# Patient Record
Sex: Male | Born: 1935
Health system: Southern US, Community
[De-identification: ages and names within clinical notes are randomized; demographics above are authoritative.]

## PROBLEM LIST (undated history)

## (undated) DIAGNOSIS — I4891 Unspecified atrial fibrillation: Secondary | ICD-10-CM

## (undated) DIAGNOSIS — N400 Enlarged prostate without lower urinary tract symptoms: Secondary | ICD-10-CM

## (undated) DIAGNOSIS — M1990 Unspecified osteoarthritis, unspecified site: Secondary | ICD-10-CM

## (undated) DIAGNOSIS — F0390 Unspecified dementia without behavioral disturbance: Secondary | ICD-10-CM

## (undated) DIAGNOSIS — B029 Zoster without complications: Secondary | ICD-10-CM

## (undated) DIAGNOSIS — I739 Peripheral vascular disease, unspecified: Secondary | ICD-10-CM

## (undated) DIAGNOSIS — H3530 Unspecified macular degeneration: Secondary | ICD-10-CM

## (undated) DIAGNOSIS — N200 Calculus of kidney: Secondary | ICD-10-CM

## (undated) HISTORY — PX: EYE SURGERY: SHX253

## (undated) HISTORY — DX: Unspecified osteoarthritis, unspecified site: M19.90

## (undated) HISTORY — PX: HERNIA REPAIR: SHX51

## (undated) HISTORY — PX: APPENDECTOMY: SHX54

---

## 2005-09-20 ENCOUNTER — Ambulatory Visit: Payer: Self-pay | Admitting: Pulmonary Disease

## 2005-10-14 ENCOUNTER — Ambulatory Visit: Payer: Self-pay | Admitting: Pulmonary Disease

## 2005-10-22 ENCOUNTER — Ambulatory Visit: Payer: Self-pay | Admitting: Pulmonary Disease

## 2009-01-16 ENCOUNTER — Ambulatory Visit: Payer: Self-pay | Admitting: Internal Medicine

## 2009-01-16 DIAGNOSIS — M129 Arthropathy, unspecified: Secondary | ICD-10-CM | POA: Insufficient documentation

## 2009-01-16 DIAGNOSIS — R531 Weakness: Secondary | ICD-10-CM | POA: Insufficient documentation

## 2009-01-16 DIAGNOSIS — Z87442 Personal history of urinary calculi: Secondary | ICD-10-CM | POA: Insufficient documentation

## 2009-01-16 DIAGNOSIS — J309 Allergic rhinitis, unspecified: Secondary | ICD-10-CM | POA: Insufficient documentation

## 2009-01-16 DIAGNOSIS — E785 Hyperlipidemia, unspecified: Secondary | ICD-10-CM | POA: Insufficient documentation

## 2009-01-16 DIAGNOSIS — R3911 Hesitancy of micturition: Secondary | ICD-10-CM | POA: Insufficient documentation

## 2009-01-16 DIAGNOSIS — R5383 Other fatigue: Secondary | ICD-10-CM

## 2009-01-17 DIAGNOSIS — Z8551 Personal history of malignant neoplasm of bladder: Secondary | ICD-10-CM | POA: Insufficient documentation

## 2009-01-17 DIAGNOSIS — M545 Low back pain: Secondary | ICD-10-CM

## 2009-01-17 DIAGNOSIS — Z87438 Personal history of other diseases of male genital organs: Secondary | ICD-10-CM | POA: Insufficient documentation

## 2009-01-17 DIAGNOSIS — G8929 Other chronic pain: Secondary | ICD-10-CM | POA: Insufficient documentation

## 2009-01-17 LAB — CONVERTED CEMR LAB
ALT: 18 units/L (ref 0–53)
AST: 21 units/L (ref 0–37)
Albumin: 4 g/dL (ref 3.5–5.2)
Alkaline Phosphatase: 86 units/L (ref 39–117)
BUN: 16 mg/dL (ref 6–23)
Basophils Absolute: 0 10*3/uL (ref 0.0–0.1)
Basophils Relative: 0.7 % (ref 0.0–3.0)
Bilirubin, Direct: 0.1 mg/dL (ref 0.0–0.3)
CO2: 25 meq/L (ref 19–32)
Calcium: 9.3 mg/dL (ref 8.4–10.5)
Chloride: 105 meq/L (ref 96–112)
Cholesterol: 224 mg/dL — ABNORMAL HIGH (ref 0–200)
Creatinine, Ser: 1 mg/dL (ref 0.4–1.5)
Direct LDL: 174.9 mg/dL
Eosinophils Absolute: 0.3 10*3/uL (ref 0.0–0.7)
Eosinophils Relative: 4.1 % (ref 0.0–5.0)
Folate: >20 ng/mL
GFR calc non Af Amer: 75.32 mL/min (ref >60)
Glucose, Bld: 95 mg/dL (ref 70–99)
HCT: 41.5 % (ref 39.0–52.0)
HDL: 36.8 mg/dL — ABNORMAL LOW (ref >39.00)
Hemoglobin: 13.7 g/dL (ref 13.0–17.0)
Lymphocytes Relative: 25.7 % (ref 12.0–46.0)
Lymphs Abs: 1.6 10*3/uL (ref 0.7–4.0)
MCHC: 33.1 g/dL (ref 30.0–36.0)
MCV: 93.7 fL (ref 78.0–100.0)
Monocytes Absolute: 0.6 10*3/uL (ref 0.1–1.0)
Monocytes Relative: 9.4 % (ref 3.0–12.0)
Neutro Abs: 3.9 10*3/uL (ref 1.4–7.7)
Neutrophils Relative %: 60.1 % (ref 43.0–77.0)
PSA: 0.79 ng/mL (ref 0.10–4.00)
Platelets: 226 10*3/uL (ref 150.0–400.0)
Potassium: 4.5 meq/L (ref 3.5–5.1)
RBC: 4.43 M/uL (ref 4.22–5.81)
RDW: 12.9 % (ref 11.5–14.6)
Sodium: 141 meq/L (ref 135–145)
TSH: 1.12 microintl units/mL (ref 0.35–5.50)
Total Bilirubin: 0.5 mg/dL (ref 0.3–1.2)
Total CHOL/HDL Ratio: 6
Total Protein: 7 g/dL (ref 6.0–8.3)
Triglycerides: 150 mg/dL — ABNORMAL HIGH (ref 0.0–149.0)
VLDL: 30 mg/dL (ref 0.0–40.0)
Vitamin B-12: 402 pg/mL (ref 211–911)
WBC: 6.4 10*3/uL (ref 4.5–10.5)

## 2009-01-20 ENCOUNTER — Ambulatory Visit: Payer: Self-pay | Admitting: Internal Medicine

## 2009-01-23 DIAGNOSIS — M48061 Spinal stenosis, lumbar region without neurogenic claudication: Secondary | ICD-10-CM | POA: Insufficient documentation

## 2009-01-23 DIAGNOSIS — R930 Abnormal findings on diagnostic imaging of skull and head, not elsewhere classified: Secondary | ICD-10-CM | POA: Insufficient documentation

## 2009-01-23 DIAGNOSIS — M5137 Other intervertebral disc degeneration, lumbosacral region: Secondary | ICD-10-CM | POA: Insufficient documentation

## 2009-01-23 DIAGNOSIS — K4090 Unilateral inguinal hernia, without obstruction or gangrene, not specified as recurrent: Secondary | ICD-10-CM | POA: Insufficient documentation

## 2009-01-24 ENCOUNTER — Telehealth: Payer: Self-pay | Admitting: Internal Medicine

## 2009-02-14 ENCOUNTER — Telehealth: Payer: Self-pay | Admitting: Internal Medicine

## 2009-02-16 ENCOUNTER — Ambulatory Visit: Payer: Self-pay | Admitting: Internal Medicine

## 2009-03-06 ENCOUNTER — Encounter: Payer: Self-pay | Admitting: Internal Medicine

## 2009-08-21 ENCOUNTER — Encounter: Payer: Self-pay | Admitting: Internal Medicine

## 2009-08-30 ENCOUNTER — Ambulatory Visit (HOSPITAL_COMMUNITY): Admission: RE | Admit: 2009-08-30 | Discharge: 2009-08-30 | Payer: Self-pay | Admitting: General Surgery

## 2009-09-14 ENCOUNTER — Encounter: Payer: Self-pay | Admitting: Internal Medicine

## 2010-05-08 NOTE — Letter (Signed)
Summary: [facility]   Imported By: Sherian Rein 09/20/2009 11:44:19  _____________________________________________________________________  External Attachment:    Type:   Image     Comment:   External Document

## 2010-05-08 NOTE — Letter (Signed)
Summary: [facility]   Imported By: Sherian Rein 09/26/2009 08:20:19  _____________________________________________________________________  External Attachment:    Type:   Image     Comment:   External Document

## 2010-06-25 LAB — CBC
HCT: 41.4 % (ref 39.0–52.0)
Hemoglobin: 13.6 g/dL (ref 13.0–17.0)
MCHC: 32.8 g/dL (ref 30.0–36.0)
MCV: 92.8 fL (ref 78.0–100.0)
Platelets: 211 10*3/uL (ref 150–400)
RBC: 4.46 MIL/uL (ref 4.22–5.81)
RDW: 13.9 % (ref 11.5–15.5)
WBC: 6.3 10*3/uL (ref 4.0–10.5)

## 2010-06-25 LAB — DIFFERENTIAL
Basophils Absolute: 0.1 10*3/uL (ref 0.0–0.1)
Basophils Relative: 1 % (ref 0–1)
Eosinophils Absolute: 0.3 10*3/uL (ref 0.0–0.7)
Eosinophils Relative: 4 % (ref 0–5)
Lymphocytes Relative: 31 % (ref 12–46)
Lymphs Abs: 2 10*3/uL (ref 0.7–4.0)
Monocytes Absolute: 0.5 10*3/uL (ref 0.1–1.0)
Monocytes Relative: 8 % (ref 3–12)
Neutro Abs: 3.5 10*3/uL (ref 1.7–7.7)
Neutrophils Relative %: 56 % (ref 43–77)

## 2010-06-25 LAB — BASIC METABOLIC PANEL
BUN: 14 mg/dL (ref 6–23)
CO2: 25 mEq/L (ref 19–32)
Calcium: 9.2 mg/dL (ref 8.4–10.5)
Chloride: 107 mEq/L (ref 96–112)
Creatinine, Ser: 0.96 mg/dL (ref 0.4–1.5)
GFR calc Af Amer: >60 mL/min (ref >60)
GFR calc non Af Amer: >60 mL/min (ref >60)
Glucose, Bld: 95 mg/dL (ref 70–99)
Potassium: 4.4 mEq/L (ref 3.5–5.1)
Sodium: 139 mEq/L (ref 135–145)

## 2014-02-08 ENCOUNTER — Ambulatory Visit (INDEPENDENT_AMBULATORY_CARE_PROVIDER_SITE_OTHER): Payer: Medicare Other | Admitting: Internal Medicine

## 2014-02-08 ENCOUNTER — Ambulatory Visit (INDEPENDENT_AMBULATORY_CARE_PROVIDER_SITE_OTHER): Payer: Medicare Other

## 2014-02-08 VITALS — BP 120/80 | HR 79 | Temp 99.3°F | Resp 16 | Ht 69.0 in | Wt 177.0 lb

## 2014-02-08 DIAGNOSIS — S91332A Puncture wound without foreign body, left foot, initial encounter: Secondary | ICD-10-CM

## 2014-02-08 DIAGNOSIS — L03116 Cellulitis of left lower limb: Secondary | ICD-10-CM

## 2014-02-08 DIAGNOSIS — Z23 Encounter for immunization: Secondary | ICD-10-CM

## 2014-02-08 MED ORDER — AMOXICILLIN-POT CLAVULANATE 875-125 MG PO TABS: 1.0000 | ORAL_TABLET | Freq: Two times a day (BID) | ORAL | Status: DC

## 2014-02-08 MED ORDER — CEFTRIAXONE SODIUM 1 G IJ SOLR
1.0000 g | Freq: Once | INTRAMUSCULAR | Status: AC
  Administered 2014-02-08: 1 g via INTRAMUSCULAR

## 2014-02-08 NOTE — Progress Notes (Addendum)
   Subjective:    Patient ID: Cory Hansen, male    DOB: April 11, 1920, 80 y.o.   MRN: [id number] This chart was scribed for Ellamae Siaobert Miryah Ralls, MD by Jolene Provostobert Halas, Medical Scribe. This patient was seen in Room 6 and the patient's care was started a 9:54 AM.  Chief Complaint  Patient presents with  . Foot Injury    left foot stepped on nail last week    HPI HPI Comments: Cory Hansen is a 80 y.o. male with a hx of HLD and bladder cancer who [facility] of a left foot injury that happened four days ago. Pt states that he stepped on a nail four days ago. Pt states that he has reduced feeling in his foot, which is his baseline/due to spinal stenosis. Pt endorses associated edema and erythema in the left foot. Pt denies pain in the left foot. Pt states the injury has been worsening over the last several days. Pt states he has not had a tetanus shot in ten years, and would like one.  No reg care for years   Review of Systems  Constitutional: Negative for fever and chills.  Musculoskeletal: Positive for gait problem.       Walks with cane.  Skin: Positive for wound.       Objective:  BP 120/80 mmHg  Pulse 79  Temp(Src) 99.3 F (37.4 C) (Oral)  Resp 16  Ht 5\' 9"  (1.753 m)  Wt 177 lb (80.287 kg)  BMI 26.13 kg/m2  SpO2 95%  Physical Exam  Constitutional: He is oriented to person, place, and time. He appears well-developed and well-nourished.  HENT:  Head: Normocephalic and atraumatic.  Eyes: Pupils are equal, round, and reactive to light.  Neck: No JVD present.  Cardiovascular: Intact distal pulses.   Pulmonary/Chest: Effort normal. No respiratory distress.  Musculoskeletal:  Swelling and redness of the left foot that extends medially and dorsally from a puncture wound site under the first MTP. There is decreased sensation to touch over the entire foot, but he can feel pressure around the wound. No abscess formation. There is some red streaking to the  anterior ankle. ROM of the first MTP is normal without pain. Toenails have fungal changes. The pulses are intact in the foot. Pt walks with a cane  Neurological: He is alert and oriented to person, place, and time.  Skin: Skin is warm and dry.  Psychiatric: He has a normal mood and affect. His behavior is normal.  Nursing note and vitals reviewed.  [facility] reading (PRIMARY) by  Dr.Rebekkah Powless=no FB seen near 1st MTP//obvious degen changes thruout foot      Assessment & Plan:   I have completed the patient encounter in its entirety as documented by the scribe, with editing by me where necessary. Keionna Kinnaird P. Merla Richesoolittle, M.D.  Puncture wound of foot, left, initial encounter with cellulitis - -cefTRIAXone (ROCEPHIN) injection 1 g now then augm 10d F/u 48-72h////elevate---heat  Need for Tdap vaccination - done

## 2014-02-08 NOTE — Patient Instructions (Signed)

## 2014-02-11 ENCOUNTER — Ambulatory Visit (INDEPENDENT_AMBULATORY_CARE_PROVIDER_SITE_OTHER): Payer: Medicare Other | Admitting: Emergency Medicine

## 2014-02-11 ENCOUNTER — Inpatient Hospital Stay (HOSPITAL_COMMUNITY)
Admission: AD | Admit: 2014-02-11 | Discharge: 2014-02-14 | DRG: 603 | Disposition: A | Discharge status: Home / Self Care | Payer: Medicare Other | Source: Ambulatory Visit | Attending: Family Medicine | Admitting: Family Medicine

## 2014-02-11 ENCOUNTER — Encounter (HOSPITAL_COMMUNITY): Payer: Self-pay | Admitting: *Deleted

## 2014-02-11 VITALS — BP 122/74 | HR 86 | Temp 98.5°F | Resp 17 | Ht 69.5 in | Wt 175.0 lb

## 2014-02-11 DIAGNOSIS — M1990 Unspecified osteoarthritis, unspecified site: Secondary | ICD-10-CM | POA: Diagnosis present

## 2014-02-11 DIAGNOSIS — M4806 Spinal stenosis, lumbar region: Secondary | ICD-10-CM | POA: Diagnosis present

## 2014-02-11 DIAGNOSIS — S91332A Puncture wound without foreign body, left foot, initial encounter: Secondary | ICD-10-CM | POA: Diagnosis not present

## 2014-02-11 DIAGNOSIS — E785 Hyperlipidemia, unspecified: Secondary | ICD-10-CM | POA: Diagnosis present

## 2014-02-11 DIAGNOSIS — L03116 Cellulitis of left lower limb: Secondary | ICD-10-CM | POA: Diagnosis present

## 2014-02-11 DIAGNOSIS — M7989 Other specified soft tissue disorders: Secondary | ICD-10-CM | POA: Diagnosis not present

## 2014-02-11 LAB — CBC
HCT: 39.5 % (ref 39.0–52.0)
Hemoglobin: 12.8 g/dL — ABNORMAL LOW (ref 13.0–17.0)
MCH: 30 pg (ref 26.0–34.0)
MCHC: 32.4 g/dL (ref 30.0–36.0)
MCV: 92.5 fL (ref 78.0–100.0)
Platelets: 252 10*3/uL (ref 150–400)
RBC: 4.27 MIL/uL (ref 4.22–5.81)
RDW: 12.8 % (ref 11.5–15.5)
WBC: 8.7 10*3/uL (ref 4.0–10.5)

## 2014-02-11 LAB — BASIC METABOLIC PANEL
Anion gap: 15 (ref 5–15)
BUN: 13 mg/dL (ref 6–23)
CO2: 23 mEq/L (ref 19–32)
Calcium: 9.2 mg/dL (ref 8.4–10.5)
Chloride: 98 mEq/L (ref 96–112)
Creatinine, Ser: 1 mg/dL (ref 0.50–1.35)
GFR calc Af Amer: 74 mL/min — ABNORMAL LOW (ref >90)
GFR calc non Af Amer: 64 mL/min — ABNORMAL LOW (ref >90)
Glucose, Bld: 89 mg/dL (ref 70–99)
Potassium: 4.4 mEq/L (ref 3.7–5.3)
Sodium: 136 mEq/L — ABNORMAL LOW (ref 137–147)

## 2014-02-11 MED ORDER — ACETAMINOPHEN 325 MG PO TABS: 650.0000 mg | ORAL_TABLET | Freq: Four times a day (QID) | ORAL | Status: DC | PRN

## 2014-02-11 MED ORDER — CLINDAMYCIN PHOSPHATE 600 MG/50ML IV SOLN
600.0000 mg | Freq: Three times a day (TID) | INTRAVENOUS | Status: DC
  Administered 2014-02-11 – 2014-02-13 (×6): 600 mg via INTRAVENOUS
  Filled 2014-02-11 (×8): qty 50

## 2014-02-11 MED ORDER — GLUCOSAMINE-CHONDROITIN 500-400 MG PO TABS: 1.0000 | ORAL_TABLET | Freq: Three times a day (TID) | ORAL | Status: DC

## 2014-02-11 MED ORDER — SODIUM CHLORIDE 0.9 % IJ SOLN: 3.0000 mL | INTRAMUSCULAR | Status: DC | PRN

## 2014-02-11 MED ORDER — SODIUM CHLORIDE 0.9 % IJ SOLN
3.0000 mL | Freq: Two times a day (BID) | INTRAMUSCULAR | Status: DC
  Administered 2014-02-12 – 2014-02-13 (×4): 3 mL via INTRAVENOUS

## 2014-02-11 MED ORDER — HEPARIN SODIUM (PORCINE) 5000 UNIT/ML IJ SOLN
5000.0000 [IU] | Freq: Three times a day (TID) | INTRAMUSCULAR | Status: DC
  Administered 2014-02-11 – 2014-02-14 (×8): 5000 [IU] via SUBCUTANEOUS
  Filled 2014-02-11 (×9): qty 1

## 2014-02-11 MED ORDER — SODIUM CHLORIDE 0.9 % IV SOLN: 250.0000 mL | INTRAVENOUS | Status: DC | PRN

## 2014-02-11 MED ORDER — ACETAMINOPHEN 650 MG RE SUPP: 650.0000 mg | Freq: Four times a day (QID) | RECTAL | Status: DC | PRN

## 2014-02-11 NOTE — Progress Notes (Signed)
Cory Hansen [id number] Code Status: full   Admission Data: 02/11/2014 12:29 PM Attending Provider:  Denny LevyNeal, sara WUJ:[id number]:Valerie Felicity CoyerLeschber, MD Consults/ Treatment Team:    Cory CoteLee Lewis Clonch is a 80 y.o. male patient admitted from ED awake, alert - oriented  X 3 - no acute distress noted.  VSS - There were no vitals taken for this visit.    IV in place, occlusive dsg intact without redness.  Orientation to room, and floor completed with information packet given to patient/family.  Patient declined safety video at this time.  Admission INP armband ID verified with patient/family, and in place.   SR up x 2, fall assessment complete, with patient and family able to verbalize understanding of risk associated with falls, and verbalized understanding to call nsg before up out of bed.  Call light within reach, patient able to voice, and demonstrate understanding.  Skin, clean-dry- intact without evidence of bruising, or skin tears.   No evidence of skin break down noted on exam.     Will cont to eval and treat per MD orders.  Alexandria Shiflett, Delos HaringSIERRA D, RN 02/11/2014 12:29 PM

## 2014-02-11 NOTE — Plan of Care (Signed)
Problem: Phase I Progression Outcomes Goal: Pain controlled with appropriate interventions Outcome: Completed/Met Date Met:  02/11/14

## 2014-02-11 NOTE — H&P (Signed)
Family Medicine Teaching [facility] Admission History and Physical Service Pager: [phone]  Patient name: Cory Hansen Medical record number: [id number] Date of birth: 1920/11/06 Age: 80 y.o. Gender: male  Primary Care Provider: Rene PaciValerie Leschber, MD Consultants: none Code Status: full  Chief Complaint: stepped on a nail  Assessment and Plan: Cory Hansen is a 80 y.o. male presenting with left foot swelling and redness after stepping on a nail. PMH is significant for HLD, spinal stenosis and arthritis.  # Cellulitis: S/p outpatient treatment with rocephin x1 and augmentin for 3 days. Not significantly improved. Lines drawn in clinic ~9am 11/6 - IV clinda - anticipate switch to PO antibiotics in 1-2 days with clinical improvement  # Puncture wound: s/p Tdap on 11/3 when he initially presented. Injury occurred on 10/31. - no need for toxoid or immune globulin as pt has previously received >3 tetanus vaccines - xray on 11/3 showed no radiopaque foreign body   FEN/GI: Regular diet, SLIV Prophylaxis: SQ heparin  Disposition: Admit to med-surg, attending Dr. Jennette KettleNeal  History of Present Illness: Cory Hansen is a [age] y.o. male presenting with 1 week of swelling and redness in left foot after stepping on a nail. The patient stepped on a nail which went through his shoe. He felt fine for the first few days and then noticed that it was swollen and red so he went to the urgent care where he was given ceftriaxone and augmentin. He reports his foot improved somewhat with this but then seemed to get worse again. He returned to the urgent care where his foot appeared to be worsened so he was admitted for IV antibiotics. He reports feeling some chills on the 3rd but no documented fevers. He indorses some pain with weight bearing but reports he does not feel his feet well because of his spinal stenosis.  Review Of Systems: Per HPI with the following additions: no rigors,  sweats. Otherwise 12 point review of systems was performed and was unremarkable.  Patient Active Problem List   Diagnosis Date Noted  . Cellulitis of leg, left 02/11/2014  . INGUINAL HERNIA 01/23/2009  . DEGENERATIVE DISC DISEASE, LUMBAR SPINE 01/23/2009  . SPINAL STENOSIS, LUMBAR 01/23/2009  . Nonspecific (abnormal) findings on radiological and other examination of body structure 01/23/2009  . CT, CHEST, ABNORMAL 01/23/2009  . BACK PAIN, LUMBAR 01/17/2009  . NEOPLASM, MALIGNANT, BLADDER, HX OF 01/17/2009  . NEOPLASM, BENIGN, PROSTATE, HX OF 01/17/2009  . HYPERLIPIDEMIA 01/16/2009  . ALLERGIC RHINITIS 01/16/2009  . ARTHRITIS 01/16/2009  . FATIGUE 01/16/2009  . URINARY HESITANCY 01/16/2009  . RENAL CALCULUS, HX OF 01/16/2009   Past Medical History: Past Medical History  Diagnosis Date  . Arthritis    Past Surgical History: No past surgical history on file. Social History: History  Substance Use Topics  . Smoking status: Never Smoker   . Smokeless tobacco: Not on file  . Alcohol Use: Not on file   Please also refer to relevant sections of EMR.  Family History: No family history on file. Allergies and Medications: No Known Allergies No current facility-administered medications on file prior to encounter.   Current Outpatient Prescriptions on File Prior to Encounter  Medication Sig Dispense Refill  . amoxicillin-clavulanate (AUGMENTIN) 875-125 MG per tablet Take 1 tablet by mouth 2 (two) times daily. 20 tablet 0  . glucosamine-chondroitin 500-400 MG tablet Take 1 tablet by mouth 3 (three) times daily.      Objective: BP 134/71 mmHg  Pulse 79  Temp(Src) 98.8 F (37.1 C) (Oral)  Resp 19  SpO2 100% Exam: General: elderly gentleman, lying in bed, NAD HEENT: MMM, NCAT Cardiovascular: RRR, no MRG Respiratory: CTAB, normal WOB Abdomen: soft, NTND Extremities: marked ulnar deviation of bilateral hands, LLE hot red and swollen from toe to ankle, small scab at 1st  metatarsal head where nail entered, no sign of retained foreign body, RLE normal, nonedematous Neuro: alert and oriented, no focal deficits  Labs and Imaging: CBC BMET   Recent Labs Lab 02/11/14 1316  WBC 8.7  HGB 12.8*  HCT 39.5  PLT 252   No results for input(s): NA, K, CL, CO2, BUN, CREATININE, GLUCOSE, CALCIUM in the last 168 hours.   Left foot xray 11/3: No acute bony findings or radiopaque foreign body.  Abram SanderElena M Yumalay Circle, MD 02/11/2014, 2:37 PM PGY-2, [place] [facility] FPTS Intern pager: [phone], text pages welcome

## 2014-02-11 NOTE — Progress Notes (Signed)
Subjective:    Patient ID: Cory Hansen, male    DOB: October 12, 1920, 80 y.o.   MRN: [id number] This chart was scribed for Viviann SpareSteven A. Cleta Albertsaub, MD by Chestine SporeSoijett Blue, ED Scribe. The patient was seen in room 6 at 10:16 AM.   Chief Complaint  Patient presents with  . Follow-up    Foot injury     HPI Cory Hansen is a 80 y.o. male who presents today complaining of a F/U for a foot injury onset 1 week. He states that he had on a tennis shoe and he was punctured by a nail. Relative states that yesterday the foot was not as red, it was blueish red. He states that he has been walking this morning. He denies there being pain to the affected area. He states that he is having associated symptoms of chills, appetite change, redness of the left foot. He denies any other associated symptoms. He denies having DM or heart issues. He states that he has not had a physical in 10 years.    Patient Active Problem List   Diagnosis Date Noted  . INGUINAL HERNIA 01/23/2009  . DEGENERATIVE DISC DISEASE, LUMBAR SPINE 01/23/2009  . SPINAL STENOSIS, LUMBAR 01/23/2009  . Nonspecific (abnormal) findings on radiological and other examination of body structure 01/23/2009  . CT, CHEST, ABNORMAL 01/23/2009  . BACK PAIN, LUMBAR 01/17/2009  . NEOPLASM, MALIGNANT, BLADDER, HX OF 01/17/2009  . NEOPLASM, BENIGN, PROSTATE, HX OF 01/17/2009  . HYPERLIPIDEMIA 01/16/2009  . ALLERGIC RHINITIS 01/16/2009  . ARTHRITIS 01/16/2009  . FATIGUE 01/16/2009  . URINARY HESITANCY 01/16/2009  . RENAL CALCULUS, HX OF 01/16/2009   Past Medical History  Diagnosis Date  . Arthritis    No past surgical history on file. No Known Allergies Prior to Admission medications   Medication Sig Start Date End Date Taking? Authorizing Provider  amoxicillin-clavulanate (AUGMENTIN) 875-125 MG per tablet Take 1 tablet by mouth 2 (two) times daily. 02/08/14  Yes Tonye Pearsonobert P Doolittle, MD  glucosamine-chondroitin 500-400 MG tablet Take 1 tablet by  mouth 3 (three) times daily.   Yes Historical Provider, MD    Review of Systems  Constitutional: Positive for chills and appetite change.  Skin: Positive for color change (redness of the left foot).  All other systems reviewed and are negative.      Objective:   Physical Exam  Constitutional: He is oriented to person, place, and time. He appears well-developed and well-nourished. No distress.  HENT:  Head: Normocephalic and atraumatic.  Eyes: EOM are normal.  Neck: Neck supple.  Cardiovascular: Normal rate.   Pulmonary/Chest: Effort normal. No respiratory distress.  Genitourinary:  No lymph glands in the left groin.   Musculoskeletal: Normal range of motion.  Neurological: He is alert and oriented to person, place, and time.  Skin: Skin is warm and dry. There is erythema.  Erythema that Extends over the entire dorsum of the left foot into the ankle with significant swelling.   Psychiatric: He has a normal mood and affect. His behavior is normal.  Nursing note and vitals reviewed.      BP 122/74 mmHg  Pulse 86  Temp(Src) 98.5 F (36.9 C) (Oral)  Resp 17  Ht 5' 9.5" (1.765 m)  Wt 175 lb (79.379 kg)  BMI 25.48 kg/m2  SpO2 98%   Assessment & Plan:  DIAGNOSTIC STUDIES: Oxygen Saturation is 98% on room air, normal by my interpretation.    COORDINATION OF CARE: 10:24 AM-Discussed treatment plan which includes admittance  to hospital for IV abx and Fluids with pt at bedside and pt agreed to plan.  Assessment progressive cellulitis involving the left foot. She has been on Augmentin and received a shot of Rocephin previously. His wound looked better yesterday but today has significant increase in swelling redness and discomfort with difficulty with ambulation. Concerns would be of retained foreign material in the foot and possibility of Pseudomonas infection. Will admit for further evaluation and treatment.  I personally performed the services described in this documentation,  which was scribed in my presence. The recorded information has been reviewed and is accurate.

## 2014-02-11 NOTE — Progress Notes (Signed)
Paged Family medicine for orders and pharmacy for PTA med list.

## 2014-02-12 MED ORDER — DOXYCYCLINE HYCLATE 100 MG PO TABS
100.0000 mg | ORAL_TABLET | Freq: Two times a day (BID) | ORAL | Status: DC
  Administered 2014-02-12 – 2014-02-14 (×5): 100 mg via ORAL
  Filled 2014-02-12 (×6): qty 1

## 2014-02-12 NOTE — Progress Notes (Signed)
Family Medicine Teaching Service Daily Progress Note Intern Pager: [phone]  Patient name: Kathlene CoteLee Lewis Meininger Medical record number: [id number] Date of birth: [birth year] Age: 80 y.o. Gender: male  Primary Care Provider: Rene PaciValerie Leschber, MD Consultants: None Code Status: Full  Assessment and Plan:  Kathlene CoteLee Lewis Schramm is a 80 y.o. male presenting with left foot swelling and redness after stepping on a nail. PMH is significant for HLD, spinal stenosis and arthritis.  # Cellulitis:  - Admitted for IV antibiotics after failing by mouth antibiotics. - minimal improvement but no spread beyond the drawn lines - continue IV clindamycin, add by mouth doxycycline to double cover MRSA - anticipate switch to PO antibiotics in 1-2 days with clinical improvement  # Puncture wound: s/p Tdap on 11/3 when he initially presented. Injury occurred on 10/31. - no need for toxoid or immune globulin as pt has previously received >3 tetanus vaccines - xray on 11/3 showed no radiopaque foreign body   FEN/GI: Regular diet, SLIV Prophylaxis: SQ heparin  Disposition: continue MedSurg for IV antibiotics and close monitoring  Subjective:  Patient states he's feeling well overall and states that he feels his foot has improved. He denies any pain in the affected foot.  Objective: Temp:  [98.3 F (36.8 C)-98.8 F (37.1 C)] 98.3 F (36.8 C) (11/07 0300) Pulse Rate:  [66-76] 75 (11/07 0400) Resp:  [15-18] 15 (11/07 0300) BP: (98-118)/(46-73) 112/73 mmHg (11/07 0400) SpO2:  [95 %-96 %] 96 % (11/07 0300) Physical Exam: General: elderly gentleman, lying in bed, NAD HEENT: MMM, NCAT Cardiovascular: RRR, no MRG Respiratory: CTAB, normal WOB Abdomen: soft, NTND Extremities: marked ulnar deviation of bilateral hands, LLE hot red and swollen from toe to anklewithout extension beyond the drawn lines, small scab at 1st metatarsal head where nail entered, nonedematous Neuro: alert and oriented, no focal  deficits  Laboratory:  Recent Labs Lab 02/11/14 1316  WBC 8.7  HGB 12.8*  HCT 39.5  PLT 252    Recent Labs Lab 02/11/14 1316  NA 136*  K 4.4  CL 98  CO2 23  BUN 13  CREATININE 1.00  CALCIUM 9.2  GLUCOSE 89    Imaging/Diagnostic Tests: XR 02/08/2014 IMPRESSION: No acute bony findings or radiopaque foreign body.  Elenora GammaSamuel L Glenard Keesling, MD 02/12/2014, 2:44 PM PGY-3, [place] [facility] FPTS Intern pager: [phone], text pages welcome

## 2014-02-13 MED ORDER — CLINDAMYCIN HCL 300 MG PO CAPS
300.0000 mg | ORAL_CAPSULE | Freq: Three times a day (TID) | ORAL | Status: DC
  Administered 2014-02-13 – 2014-02-14 (×3): 300 mg via ORAL
  Filled 2014-02-13 (×6): qty 1

## 2014-02-13 NOTE — Progress Notes (Signed)
Family Medicine Teaching Service Daily Progress Note Intern Pager: [phone]  Patient name: Cory Hansen Medical record number: [id number] Date of birth: 07/06/1920 Age: 80 y.o. Gender: male  Primary Care Provider: Rene PaciValerie Leschber, MD Consultants: None Code Status: Full  Assessment and Plan:  Cory Hansen is a 80 y.o. male presenting with left foot swelling and redness after stepping on a nail. PMH is significant for HLD, spinal stenosis and arthritis.  # Cellulitis:  - Admitted for IV antibiotics after failing PO antibiotics. - minimal improvement but no spread beyond the drawn lines - Transition tyo PO antibiotics today, Day 3/7, clinda and doxy to double cover MRSA  # Puncture wound: s/p Tdap on 11/3 when he initially presented. Injury occurred on 10/31. - no need for toxoid or immune globulin as pt has previously received >3 tetanus vaccines - xray on 11/3 showed no radiopaque foreign body   Crackles- disspate with deep inspiration c/w with atelectasis - IS  FEN/GI: Regular diet, SLIV Prophylaxis: SQ heparin  Disposition: continue MedSurg, transition to PO and DC tomorrow if tolerating and clinically stable  Subjective:  Feeling well, denies pain or dyspnea. Family reports heavy breathing while sleeping.   Objective: Temp:  [98 F (36.7 C)-98.7 F (37.1 C)] 98 F (36.7 C) (11/08 0511) Pulse Rate:  [68-74] 74 (11/08 0511) Resp:  [16-18] 16 (11/08 0511) BP: (117-119)/(62-69) 117/62 mmHg (11/08 0511) SpO2:  [95 %-97 %] 97 % (11/08 0511) Physical Exam: General: elderly gentleman, lying in bed, NAD HEENT: MMM, NCAT Cardiovascular: RRR, no MRG Respiratory: some dissipating crackles at BL bases, otherwise clear Abdomen: soft, NTND Extremities: marked ulnar deviation of bilateral hands, LLE hot red and swollen from toe to anklewithout extension beyond the drawn lines, small scab at 1st metatarsal head where nail entered, nonedematous Neuro: alert and  oriented, no focal deficits  Laboratory:  Recent Labs Lab 02/11/14 1316  WBC 8.7  HGB 12.8*  HCT 39.5  PLT 252    Recent Labs Lab 02/11/14 1316  NA 136*  K 4.4  CL 98  CO2 23  BUN 13  CREATININE 1.00  CALCIUM 9.2  GLUCOSE 89    Imaging/Diagnostic Tests: XR 02/08/2014 IMPRESSION: No acute bony findings or radiopaque foreign body.  Elenora GammaSamuel L Bradshaw, MD 02/13/2014, 10:30 AM PGY-3, [facility] FPTS Intern pager: [phone], text pages welcome

## 2014-02-14 MED ORDER — DOXYCYCLINE HYCLATE 100 MG PO TABS: 100.0000 mg | ORAL_TABLET | Freq: Two times a day (BID) | ORAL | Status: DC

## 2014-02-14 MED ORDER — CLINDAMYCIN HCL 300 MG PO CAPS: 300.0000 mg | ORAL_CAPSULE | Freq: Three times a day (TID) | ORAL | Status: DC

## 2014-02-14 MED ORDER — CLINDAMYCIN HCL 300 MG PO CAPS
300.0000 mg | ORAL_CAPSULE | Freq: Three times a day (TID) | ORAL | Status: DC
Start: 2014-02-14 — End: 2014-02-14

## 2014-02-14 NOTE — Plan of Care (Signed)
Problem: Phase III Progression Outcomes Goal: IV Meds changed to PO Outcome: Adequate for Discharge

## 2014-02-14 NOTE — Plan of Care (Signed)
Problem: Phase III Progression Outcomes Goal: Temperature < 100 Outcome: Adequate for Discharge

## 2014-02-14 NOTE — Plan of Care (Signed)
Problem: Phase III Progression Outcomes Goal: Activity at appropriate level-compared to baseline (UP IN CHAIR FOR HEMODIALYSIS)  Outcome: Progressing     

## 2014-02-14 NOTE — Discharge Instructions (Signed)

## 2014-02-14 NOTE — Progress Notes (Signed)
CARE MANAGEMENT NOTE 02/14/2014  Patient:  Cory Hansen,Cory Hansen   Account Number:  [id number]  Date Initiated:  02/14/2014  Documentation [facility]:  [facility]  Subjective/Objective Assessment:   cellulitis     Action/Plan:   lives at home with family   Anticipated DC Date:  02/14/2014   Anticipated DC Plan:  HOME/SELF CARE      DC Planning Services  CM consult      Choice offered to / List presented to:             Status of service:  Completed, signed off Medicare Important Message given?  YES (If response is "NO", the following Medicare IM given date fields will be blank) Date Medicare IM given:  02/14/2014 Medicare IM given by:  Letha CapeAYLOR,DEBORAH Date Additional Medicare IM given:   Additional Medicare IM given by:    Discharge Disposition:  HOME/SELF CARE  Per UR Regulation:  Reviewed for med. necessity/level of care/duration of stay  If discussed at Long Length of Stay Meetings, dates discussed:    Comments:  02/14/2014 1350 NCM spoke to pt and no assistance needed at home. Has five dtrs that assist him at home. Pt states he still drives and is independent at home. No NCM needs identified. Isidoro DonningAlesia Armanii Pressnell RN CCM Case Mgmt phone [phone]

## 2014-02-14 NOTE — Discharge Summary (Signed)
[facility]  Patient name: Cory Hansen Medical record number: [id number] Date of birth: 1920-05-26 Age: 80 y.o. Gender: male Date of Admission: 02/11/2014  Date of Discharge: 02/14/14 Admitting Physician: Nestor RampSara L Neal, MD  Primary Care Provider: Rene PaciValerie Leschber, MD Consultants: None  Indication for Hospitalization: Cellulitis no resolved by outpatient management   Discharge Diagnoses/Problem List:  Cellulitis   Disposition: home  Discharge Condition: stable, improved  Discharge Exam:  Blood pressure 113/94, pulse 71, temperature 98.5 F (36.9 C), temperature source Oral, resp. rate 20, height 5\' 9"  (1.753 m), weight 174 lb 2.6 oz (79 kg), SpO2 99 %. General: elderly gentleman (appears younger than his stated age), lying in bed, in NAD HEENT: MMM, NCAT, MMM, oropharynx clear  Cardiovascular: RRR, no MRG noted Respiratory: some scattered crackles at the bases bilaterally, otherwise clear  Abdomen: +BS soft, NTND. Abdominal mass noted. Extremities: marked ulnar deviation of bilateral hands, LLE pink/erythematous from great toe to ankle without extension and some improvement, small scab at 1st metatarsal head where nail entered.  Neuro: alert and oriented, no focal deficits    Brief Hospital Course:  Cory Hansen is a 80 y.o. male presenting with 1 week of swelling and redness in left foot after stepping on a nail. [facility], give Rocephin x 1 and discharged with Augmentin. At that time, he was given Tdap and Xray showed no radiopaque foreign body.  Initially he had some improvement, but then symptoms worsened with 3 days of treatment.  On presentation, he was afebrile with a WBC of 8.7. He was admitted for IV antibiotics and was subsequently started on IV clindamycin.  On 11/7, doxycycline was added for double MRSA coverage and was subsequently transitioned to PO on 11/8.   On the day of discharge, he had been  afebrile, the erythema/warmth had improved, and he was able to ambulate. He was discharged home with clinda and doxy to complete a 7 day course.   Issues for Follow Up:  -- F/u cellulitis, line drawn on 11/9 at presentation  Significant Procedures: None  Significant Labs and Imaging:   Recent Labs Lab 02/11/14 1316  WBC 8.7  HGB 12.8*  HCT 39.5  PLT 252    Recent Labs Lab 02/11/14 1316  NA 136*  K 4.4  CL 98  CO2 23  GLUCOSE 89  BUN 13  CREATININE 1.00  CALCIUM 9.2      Results/Tests Pending at Time of Discharge: None  Discharge Medications:    Medication List    STOP taking these medications        amoxicillin-clavulanate 875-125 MG per tablet  Commonly known as:  AUGMENTIN      TAKE these medications        clindamycin 300 MG capsule  Commonly known as:  CLEOCIN  Take 1 capsule (300 mg total) by mouth 3 (three) times daily. First dose this evening (11/9)     doxycycline 100 MG tablet  Commonly known as:  VIBRA-TABS  Take 1 tablet (100 mg total) by mouth 2 (two) times daily.     glucosamine-chondroitin 500-400 MG tablet  Take 1 tablet by mouth 3 (three) times daily.        Discharge Instructions: Please refer to Patient Instructions section of EMR for full details.  Patient was counseled important signs and symptoms that should prompt return to medical care, changes in medications, dietary instructions, activity restrictions, and follow up appointments.   Follow-Up  Appointments: Follow-up Information    Follow up with DAUB, STEVE A, MD. Call today.   Specialty:  Family Medicine   Why:  to make a follow up appointment in 2-3 days   Contact information:   [address] [place] [address] [phone]       Joanna Puffrystal S Sabrin Dunlevy, MD 02/14/2014, 7:14 PM PGY-1, [facility]

## 2014-02-16 ENCOUNTER — Ambulatory Visit (INDEPENDENT_AMBULATORY_CARE_PROVIDER_SITE_OTHER): Payer: Medicare Other | Admitting: Physician Assistant

## 2014-02-16 ENCOUNTER — Other Ambulatory Visit: Payer: Self-pay | Admitting: Physician Assistant

## 2014-02-16 ENCOUNTER — Ambulatory Visit (HOSPITAL_COMMUNITY)
Admission: RE | Admit: 2014-02-16 | Discharge: 2014-02-16 | Disposition: A | Discharge status: Home / Self Care | Payer: Medicare Other | Source: Ambulatory Visit | Attending: Physician Assistant | Admitting: Physician Assistant

## 2014-02-16 VITALS — BP 116/68 | HR 77 | Temp 97.6°F | Resp 18 | Ht 69.5 in | Wt 174.0 lb

## 2014-02-16 DIAGNOSIS — L02612 Cutaneous abscess of left foot: Secondary | ICD-10-CM | POA: Insufficient documentation

## 2014-02-16 DIAGNOSIS — L03116 Cellulitis of left lower limb: Secondary | ICD-10-CM

## 2014-02-16 DIAGNOSIS — M25472 Effusion, left ankle: Secondary | ICD-10-CM | POA: Diagnosis not present

## 2014-02-16 DIAGNOSIS — W228XXA Striking against or struck by other objects, initial encounter: Secondary | ICD-10-CM | POA: Insufficient documentation

## 2014-02-16 DIAGNOSIS — Z01818 Encounter for other preprocedural examination: Secondary | ICD-10-CM | POA: Insufficient documentation

## 2014-02-16 DIAGNOSIS — M7989 Other specified soft tissue disorders: Secondary | ICD-10-CM | POA: Diagnosis not present

## 2014-02-16 DIAGNOSIS — M4800 Spinal stenosis, site unspecified: Secondary | ICD-10-CM | POA: Insufficient documentation

## 2014-02-16 DIAGNOSIS — S0542XA Penetrating wound of orbit with or without foreign body, left eye, initial encounter: Secondary | ICD-10-CM

## 2014-02-16 DIAGNOSIS — M869 Osteomyelitis, unspecified: Secondary | ICD-10-CM | POA: Insufficient documentation

## 2014-02-16 DIAGNOSIS — S0541XA Penetrating wound of orbit with or without foreign body, right eye, initial encounter: Secondary | ICD-10-CM

## 2014-02-16 DIAGNOSIS — Z77018 Contact with and (suspected) exposure to other hazardous metals: Secondary | ICD-10-CM | POA: Diagnosis not present

## 2014-02-16 DIAGNOSIS — L0390 Cellulitis, unspecified: Secondary | ICD-10-CM | POA: Insufficient documentation

## 2014-02-16 DIAGNOSIS — M009 Pyogenic arthritis, unspecified: Secondary | ICD-10-CM | POA: Insufficient documentation

## 2014-02-16 DIAGNOSIS — S99922A Unspecified injury of left foot, initial encounter: Secondary | ICD-10-CM | POA: Diagnosis not present

## 2014-02-16 DIAGNOSIS — M19072 Primary osteoarthritis, left ankle and foot: Secondary | ICD-10-CM | POA: Insufficient documentation

## 2014-02-16 MED ORDER — GADOBENATE DIMEGLUMINE 529 MG/ML IV SOLN
16.0000 mL | Freq: Once | INTRAVENOUS | Status: AC | PRN
  Administered 2014-02-16: 16 mL via INTRAVENOUS

## 2014-02-16 NOTE — Patient Instructions (Addendum)
Go to [facility] [address] today at 5:45pm for Outpatient MRI  Continue the antibiotics from the hospital. Return here for re-evaluation on Friday, but if things are worsening, come back tomorrow.

## 2014-02-16 NOTE — Progress Notes (Signed)
Subjective:    Patient ID: Cory Hansen, male    DOB: 02/10/1921, 80 y.o.   MRN: [id number]   PCP: Rene PaciValerie Leschber, MD  Chief Complaint  Patient presents with  . Wound Check    left foot    No Known Allergies  Patient Active Problem List   Diagnosis Date Noted  . Cellulitis of leg, left 02/11/2014  . Cellulitis of foot, left 02/11/2014  . INGUINAL HERNIA 01/23/2009  . DEGENERATIVE DISC DISEASE, LUMBAR SPINE 01/23/2009  . SPINAL STENOSIS, LUMBAR 01/23/2009  . Nonspecific (abnormal) findings on radiological and other examination of body structure 01/23/2009  . CT, CHEST, ABNORMAL 01/23/2009  . BACK PAIN, LUMBAR 01/17/2009  . NEOPLASM, MALIGNANT, BLADDER, HX OF 01/17/2009  . NEOPLASM, BENIGN, PROSTATE, HX OF 01/17/2009  . HYPERLIPIDEMIA 01/16/2009  . ALLERGIC RHINITIS 01/16/2009  . ARTHRITIS 01/16/2009  . FATIGUE 01/16/2009  . URINARY HESITANCY 01/16/2009  . RENAL CALCULUS, HX OF 01/16/2009    Prior to Admission medications   Medication Sig Start Date End Date Taking? Authorizing Provider  clindamycin (CLEOCIN) 300 MG capsule Take 1 capsule (300 mg total) by mouth 3 (three) times daily. First dose this evening (11/9) 02/14/14  Yes Joanna Puffrystal S Dorsey, MD  doxycycline (VIBRA-TABS) 100 MG tablet Take 1 tablet (100 mg total) by mouth 2 (two) times daily. 02/14/14  Yes Joanna Puffrystal S Dorsey, MD  glucosamine-chondroitin 500-400 MG tablet Take 1 tablet by mouth 3 (three) times daily.   Yes Historical Provider, MD    Medical, Surgical, Family and Social History reviewed and updated.  HPI  This 80 year old gentleman presents for hospital follow up, accompanied by the eldest of his 5 daughters.  He was seen here initially on 11/03 4 days after stepping on a nail. He sustained a puncture wound to the LEFT foot, at the base of the 1st toe, from a several inch long nail that penetrated the sole of his sneaker.  Due to spinal stenosis, he had no pain from the wound and one of his  daughters noted redness and swelling when she clipped his toenails.  No FB was seen on xray. Tetanus vaccination was updated, he received a gram of ceftriaxone and started on Augmentin.  At follow-up on 11/06 he reported initial improvement after the 11/03 visit and then 24 hours of worsening. Erythema and swelling had progressed to involve the entire dorsal foot and ankle. He was admitted for IV antibiotic treatment.  He was started on IV Clindamycin, and then doxycyline was added to double cover for possible MRSA infection. He was transitioned to oral antibiotics for completion of a 7-day course and was discharged on 11/09 with instructions to follow up here.  He reports that he is doing well, tolerating the antibiotics and having no pain.  His daughter notes that the redness and swelling are better in the mornings, and worsen as he is up and about during the day.  There is an area she's concerned about at the base of the great toe. She's concerned it may be infection.  Review of Systems As above. No fever/chills. No GI/GU changes.    Objective:   Physical Exam  Constitutional: He is oriented to person, place, and time. He appears well-developed and well-nourished. He is active and cooperative. No distress.  BP 116/68 mmHg  Pulse 77  Temp(Src) 97.6 F (36.4 C) (Oral)  Resp 18  Ht 5' 9.5" (1.765 m)  Wt 174 lb (78.926 kg)  BMI 25.34 kg/m2  SpO2 96%  Eyes: Conjunctivae are normal.  Pulmonary/Chest: Effort normal.  Musculoskeletal:       Left ankle: Normal.       Left foot: There is swelling. There is no tenderness and no bony tenderness.       Feet:  Nails are hypertrophic, consistent with onychomycosis.  Site of the initial puncture wound is minimally discernable.  Neurological: He is alert and oriented to person, place, and time.  Skin: Skin is warm and dry.  Psychiatric: He has a normal mood and affect. His speech is normal and behavior is normal.   PROCEDURE: With verbal  consent, the area of apparent fluid collection was prepped in sterile fashion. Local anesthesia was provided with 1 cc of 1% lidocaine without epi. A 20 gauge needle was inserted into the suspected fluid collection, but no fluid could be aspirated.  The area was then cleansed and sterile dressing applied.       Assessment & Plan:  1. Cellulitis of foot without toes, left While overall there is improvement in the swelling and erythema, the localization of the symptoms to the base of the 1st MCP increase concern for retained FB with abscess, osteomyelitis and gout. MRI today to assess. Continue antibiotics and plan re-evaluation here in 48 hours. - MR Foot Left W Wo Contrast; Future  Fernande Brashelle S. Aluel Schwarz, PA-C Physician Assistant-Certified [facility]

## 2014-02-17 ENCOUNTER — Telehealth: Payer: Self-pay | Admitting: Physician Assistant

## 2014-02-17 ENCOUNTER — Other Ambulatory Visit: Payer: Self-pay | Admitting: Emergency Medicine

## 2014-02-17 DIAGNOSIS — M869 Osteomyelitis, unspecified: Secondary | ICD-10-CM

## 2014-02-17 NOTE — Telephone Encounter (Signed)
Spoke with patient's daughter, Talbert ForestShirley. Advised of MRI results and that we are waiting for a call back from Dr. Victorino DikeHewitt. Dr. Victorino DikeHewitt is currently in the OR. Our office has spoken with his scheduler and faxed him the MRI for review.

## 2014-02-18 ENCOUNTER — Telehealth: Payer: Self-pay

## 2014-02-18 DIAGNOSIS — L02611 Cutaneous abscess of right foot: Secondary | ICD-10-CM | POA: Diagnosis not present

## 2014-02-18 NOTE — Telephone Encounter (Signed)
Patients daughter says he the patient was referred to infectious disease and they cannot see him for another week. Patients daughter wants to know if this is ok, or should she find someone that can see him today.

## 2014-02-18 NOTE — Telephone Encounter (Signed)
Spoke to daughter- the earliest she was told she would be able to get into Dr. Laverta BaltimoreHewitt's office was next week. Dr. Victorino DikeHewitt is seeing pt this morning.  Spoke to IAC/InterActiveCorpChris and Financial controllerCandy at [facility]

## 2014-02-21 ENCOUNTER — Other Ambulatory Visit: Payer: Self-pay | Admitting: Orthopedic Surgery

## 2014-02-21 ENCOUNTER — Encounter (HOSPITAL_COMMUNITY): Payer: Self-pay | Admitting: *Deleted

## 2014-02-21 NOTE — Progress Notes (Signed)
Spoke with pt's daughter, Talbert ForestShirley for pre-op call.

## 2014-02-21 NOTE — Progress Notes (Signed)
   02/21/14 0951  OBSTRUCTIVE SLEEP APNEA  Have you ever been diagnosed with sleep apnea through a sleep study? No  Do you snore loudly (loud enough to be heard through closed doors)?  1  Do you often feel tired, fatigued, or sleepy during the daytime? 0  Has anyone observed you stop breathing during your sleep? 1  Do you have, or are you being treated for high blood pressure? 0  BMI more than 35 kg/m2? 0  Age over 78 years old? 1  Gender: 1  Obstructive Sleep Apnea Score 4  Score 4 or greater  Results sent to PCP

## 2014-02-21 NOTE — Progress Notes (Signed)
No pre-op orders in EPIC. Called Dr. Hewitt's office and left message requesting orders. 

## 2014-02-22 ENCOUNTER — Inpatient Hospital Stay (HOSPITAL_COMMUNITY)
Admission: RE | Admit: 2014-02-22 | Discharge: 2014-02-25 | DRG: 541 | Disposition: A | Discharge status: Home / Self Care | Payer: Medicare Other | Source: Ambulatory Visit | Attending: Orthopedic Surgery | Admitting: Orthopedic Surgery

## 2014-02-22 ENCOUNTER — Inpatient Hospital Stay (HOSPITAL_COMMUNITY): Payer: Medicare Other | Admitting: Anesthesiology

## 2014-02-22 ENCOUNTER — Encounter (HOSPITAL_COMMUNITY)
Admission: RE | Disposition: A | Discharge status: Home / Self Care | Payer: Self-pay | Source: Ambulatory Visit | Attending: Orthopedic Surgery

## 2014-02-22 ENCOUNTER — Encounter (HOSPITAL_COMMUNITY): Payer: Self-pay | Admitting: *Deleted

## 2014-02-22 DIAGNOSIS — Z9841 Cataract extraction status, right eye: Secondary | ICD-10-CM

## 2014-02-22 DIAGNOSIS — Z79899 Other long term (current) drug therapy: Secondary | ICD-10-CM

## 2014-02-22 DIAGNOSIS — L02612 Cutaneous abscess of left foot: Secondary | ICD-10-CM | POA: Diagnosis not present

## 2014-02-22 DIAGNOSIS — M869 Osteomyelitis, unspecified: Secondary | ICD-10-CM | POA: Diagnosis not present

## 2014-02-22 DIAGNOSIS — Z961 Presence of intraocular lens: Secondary | ICD-10-CM | POA: Diagnosis present

## 2014-02-22 DIAGNOSIS — M1990 Unspecified osteoarthritis, unspecified site: Secondary | ICD-10-CM | POA: Diagnosis present

## 2014-02-22 DIAGNOSIS — G629 Polyneuropathy, unspecified: Secondary | ICD-10-CM | POA: Diagnosis present

## 2014-02-22 DIAGNOSIS — S91342A Puncture wound with foreign body, left foot, initial encounter: Secondary | ICD-10-CM | POA: Diagnosis not present

## 2014-02-22 DIAGNOSIS — Z9842 Cataract extraction status, left eye: Secondary | ICD-10-CM | POA: Diagnosis not present

## 2014-02-22 DIAGNOSIS — M86172 Other acute osteomyelitis, left ankle and foot: Secondary | ICD-10-CM | POA: Diagnosis not present

## 2014-02-22 DIAGNOSIS — M009 Pyogenic arthritis, unspecified: Secondary | ICD-10-CM | POA: Diagnosis not present

## 2014-02-22 HISTORY — DX: Zoster without complications: B02.9

## 2014-02-22 HISTORY — DX: Calculus of kidney: N20.0

## 2014-02-22 HISTORY — PX: AMPUTATION: SHX166

## 2014-02-22 SURGERY — AMPUTATION, FOOT, PARTIAL
Anesthesia: General | Site: Foot | Laterality: Left

## 2014-02-22 MED ORDER — OXYCODONE HCL 5 MG/5ML PO SOLN: 5.0000 mg | Freq: Once | ORAL | Status: DC | PRN

## 2014-02-22 MED ORDER — HYDROCODONE-ACETAMINOPHEN 5-325 MG PO TABS: 1.0000 | ORAL_TABLET | Freq: Four times a day (QID) | ORAL | Status: DC | PRN

## 2014-02-22 MED ORDER — MAGNESIUM CITRATE PO SOLN: 1.0000 | Freq: Once | ORAL | Status: AC | PRN

## 2014-02-22 MED ORDER — BISACODYL 10 MG RE SUPP: 10.0000 mg | Freq: Every day | RECTAL | Status: DC | PRN

## 2014-02-22 MED ORDER — ONDANSETRON HCL 4 MG/2ML IJ SOLN
INTRAMUSCULAR | Status: DC | PRN
  Administered 2014-02-22: 4 mg via INTRAVENOUS

## 2014-02-22 MED ORDER — MORPHINE SULFATE 2 MG/ML IJ SOLN: 1.0000 mg | INTRAMUSCULAR | Status: DC | PRN

## 2014-02-22 MED ORDER — FENTANYL CITRATE 0.05 MG/ML IJ SOLN
INTRAMUSCULAR | Status: DC | PRN
  Administered 2014-02-22: 75 ug via INTRAVENOUS
  Administered 2014-02-22: 25 ug via INTRAVENOUS

## 2014-02-22 MED ORDER — SODIUM CHLORIDE 0.9 % IV SOLN
INTRAVENOUS | Status: DC
  Administered 2014-02-22 – 2014-02-23 (×2): via INTRAVENOUS

## 2014-02-22 MED ORDER — LACTATED RINGERS IV SOLN
INTRAVENOUS | Status: DC | PRN
  Administered 2014-02-22: 15:00:00 via INTRAVENOUS

## 2014-02-22 MED ORDER — SODIUM CHLORIDE 0.9 % IV SOLN
INTRAVENOUS | Status: DC
Start: 2014-02-22 — End: 2014-02-22

## 2014-02-22 MED ORDER — OXYCODONE HCL 5 MG PO TABS: 5.0000 mg | ORAL_TABLET | Freq: Once | ORAL | Status: DC | PRN

## 2014-02-22 MED ORDER — VANCOMYCIN HCL 10 G IV SOLR
1250.0000 mg | INTRAVENOUS | Status: DC
  Administered 2014-02-23 – 2014-02-25 (×3): 1250 mg via INTRAVENOUS
  Filled 2014-02-22 (×3): qty 1250

## 2014-02-22 MED ORDER — LIDOCAINE HCL (CARDIAC) 20 MG/ML IV SOLN
INTRAVENOUS | Status: DC | PRN
  Administered 2014-02-22: 100 mg via INTRAVENOUS

## 2014-02-22 MED ORDER — LACTATED RINGERS IV SOLN
INTRAVENOUS | Status: DC
  Administered 2014-02-22: 12:00:00 via INTRAVENOUS

## 2014-02-22 MED ORDER — MEPERIDINE HCL 25 MG/ML IJ SOLN: 6.2500 mg | INTRAMUSCULAR | Status: DC | PRN

## 2014-02-22 MED ORDER — ONDANSETRON HCL 4 MG PO TABS: 4.0000 mg | ORAL_TABLET | Freq: Four times a day (QID) | ORAL | Status: DC | PRN

## 2014-02-22 MED ORDER — FENTANYL CITRATE 0.05 MG/ML IJ SOLN
INTRAMUSCULAR | Status: AC
  Filled 2014-02-22: qty 5

## 2014-02-22 MED ORDER — VANCOMYCIN HCL IN DEXTROSE 1-5 GM/200ML-% IV SOLN
1000.0000 mg | INTRAVENOUS | Status: AC
  Administered 2014-02-22: 1000 mg via INTRAVENOUS
  Filled 2014-02-22: qty 200

## 2014-02-22 MED ORDER — PROPOFOL 10 MG/ML IV BOLUS
INTRAVENOUS | Status: AC
  Filled 2014-02-22: qty 20

## 2014-02-22 MED ORDER — ENOXAPARIN SODIUM 40 MG/0.4ML ~~LOC~~ SOLN
40.0000 mg | SUBCUTANEOUS | Status: DC
  Administered 2014-02-23 – 2014-02-25 (×3): 40 mg via SUBCUTANEOUS
  Filled 2014-02-22 (×5): qty 0.4

## 2014-02-22 MED ORDER — DOCUSATE SODIUM 100 MG PO CAPS
100.0000 mg | ORAL_CAPSULE | Freq: Two times a day (BID) | ORAL | Status: DC
  Administered 2014-02-22 – 2014-02-25 (×6): 100 mg via ORAL
  Filled 2014-02-22 (×7): qty 1

## 2014-02-22 MED ORDER — ONDANSETRON HCL 4 MG/2ML IJ SOLN: 4.0000 mg | Freq: Four times a day (QID) | INTRAMUSCULAR | Status: DC | PRN

## 2014-02-22 MED ORDER — ONDANSETRON HCL 4 MG/2ML IJ SOLN: 4.0000 mg | Freq: Once | INTRAMUSCULAR | Status: DC | PRN

## 2014-02-22 MED ORDER — PROPOFOL 10 MG/ML IV BOLUS
INTRAVENOUS | Status: DC | PRN
  Administered 2014-02-22: 150 mg via INTRAVENOUS

## 2014-02-22 MED ORDER — CHLORHEXIDINE GLUCONATE 4 % EX LIQD
60.0000 mL | Freq: Once | CUTANEOUS | Status: DC
  Filled 2014-02-22: qty 60

## 2014-02-22 MED ORDER — MAGNESIUM HYDROXIDE 400 MG/5ML PO SUSP: 30.0000 mL | Freq: Every day | ORAL | Status: DC | PRN

## 2014-02-22 MED ORDER — SENNA 8.6 MG PO TABS
1.0000 | ORAL_TABLET | Freq: Two times a day (BID) | ORAL | Status: DC
  Administered 2014-02-22 – 2014-02-25 (×6): 8.6 mg via ORAL
  Filled 2014-02-22 (×8): qty 1

## 2014-02-22 MED ORDER — HYDROMORPHONE HCL 1 MG/ML IJ SOLN: 0.2500 mg | INTRAMUSCULAR | Status: DC | PRN

## 2014-02-22 SURGICAL SUPPLY — 45 items
BANDAGE ELASTIC 4 VELCRO ST LF (GAUZE/BANDAGES/DRESSINGS) ×2 IMPLANT
BANDAGE ELASTIC 6 VELCRO ST LF (GAUZE/BANDAGES/DRESSINGS) ×2 IMPLANT
BLADE SAW SGTL MED 73X18.5 STR (BLADE) IMPLANT
BNDG CMPR 9X4 STRL LF SNTH (GAUZE/BANDAGES/DRESSINGS) ×1
BNDG COHESIVE 4X5 TAN STRL (GAUZE/BANDAGES/DRESSINGS) ×1 IMPLANT
BNDG ESMARK 4X9 LF (GAUZE/BANDAGES/DRESSINGS) ×2 IMPLANT
CANISTER SUCT 3000ML (MISCELLANEOUS) ×2 IMPLANT
CUFF TOURNIQUET SINGLE 34IN LL (TOURNIQUET CUFF) ×1 IMPLANT
CUFF TOURNIQUET SINGLE 44IN (TOURNIQUET CUFF) IMPLANT
DRAPE U-SHAPE 47X51 STRL (DRAPES) ×4 IMPLANT
DRSG ADAPTIC 3X8 NADH LF (GAUZE/BANDAGES/DRESSINGS) ×2 IMPLANT
DRSG PAD ABDOMINAL 8X10 ST (GAUZE/BANDAGES/DRESSINGS) ×2 IMPLANT
DURAPREP 26ML APPLICATOR (WOUND CARE) ×2 IMPLANT
ELECT REM PT RETURN 9FT ADLT (ELECTROSURGICAL) ×2
ELECTRODE REM PT RTRN 9FT ADLT (ELECTROSURGICAL) ×1 IMPLANT
GAUZE SPONGE 4X4 12PLY STRL (GAUZE/BANDAGES/DRESSINGS) ×2 IMPLANT
GLOVE BIO SURGEON STRL SZ7 (GLOVE) ×4 IMPLANT
GLOVE BIO SURGEON STRL SZ8 (GLOVE) ×4 IMPLANT
GLOVE BIOGEL PI IND STRL 7.5 (GLOVE) ×1 IMPLANT
GLOVE BIOGEL PI IND STRL 8 (GLOVE) ×1 IMPLANT
GLOVE BIOGEL PI INDICATOR 7.5 (GLOVE) ×1
GLOVE BIOGEL PI INDICATOR 8 (GLOVE) ×1
GOWN STRL REUS W/ TWL LRG LVL3 (GOWN DISPOSABLE) ×1 IMPLANT
GOWN STRL REUS W/ TWL XL LVL3 (GOWN DISPOSABLE) ×1 IMPLANT
GOWN STRL REUS W/TWL LRG LVL3 (GOWN DISPOSABLE) ×2
GOWN STRL REUS W/TWL XL LVL3 (GOWN DISPOSABLE) ×2
KIT BASIN OR (CUSTOM PROCEDURE TRAY) ×2 IMPLANT
KIT ROOM TURNOVER OR (KITS) ×2 IMPLANT
NS IRRIG 1000ML POUR BTL (IV SOLUTION) ×2 IMPLANT
PACK ORTHO EXTREMITY (CUSTOM PROCEDURE TRAY) ×2 IMPLANT
PAD ARMBOARD 7.5X6 YLW CONV (MISCELLANEOUS) ×4 IMPLANT
PAD CAST 4YDX4 CTTN HI CHSV (CAST SUPPLIES) ×1 IMPLANT
PADDING CAST ABS 6INX4YD NS (CAST SUPPLIES) ×1
PADDING CAST ABS COTTON 6X4 NS (CAST SUPPLIES) ×1 IMPLANT
PADDING CAST COTTON 4X4 STRL (CAST SUPPLIES) ×2
SPONGE LAP 18X18 X RAY DECT (DISPOSABLE) ×2 IMPLANT
STOCKINETTE IMPERVIOUS LG (DRAPES) ×1 IMPLANT
SUCTION FRAZIER TIP 10 FR DISP (SUCTIONS) ×2 IMPLANT
SUT ETHILON 2 0 PSLX (SUTURE) ×4 IMPLANT
TOWEL OR 17X24 6PK STRL BLUE (TOWEL DISPOSABLE) ×2 IMPLANT
TOWEL OR 17X26 10 PK STRL BLUE (TOWEL DISPOSABLE) ×2 IMPLANT
TUBE CONNECTING 12X1/4 (SUCTIONS) ×2 IMPLANT
TUBING CYSTO DISP (UROLOGICAL SUPPLIES) ×1 IMPLANT
UNDERPAD 30X30 INCONTINENT (UNDERPADS AND DIAPERS) ×2 IMPLANT
WATER STERILE IRR 1000ML POUR (IV SOLUTION) ×2 IMPLANT

## 2014-02-22 NOTE — Transfer of Care (Signed)
Immediate Anesthesia Transfer of Care Note  Patient: Cory Hansen  Procedure(s) Performed: Procedure(s): INCISION AND DRAINAGE LEFT FOREFOOT AMPUTATION FIRST RAY (Left)  Patient Location: PACU  Anesthesia Type:General  Level of Consciousness: awake, alert  and oriented  Airway & Oxygen Therapy: Patient Spontanous Breathing and Patient connected to nasal cannula oxygen  Post-op Assessment: Report given to PACU RN, Post -op Vital signs reviewed and stable and Patient moving all extremities  Post vital signs: Reviewed and stable  Complications: No apparent anesthesia complications

## 2014-02-22 NOTE — Brief Op Note (Signed)
02/22/2014  4:02 PM  PATIENT:  Cory Hansen  80 y.o. male  PRE-OPERATIVE DIAGNOSIS:  left forefoot abscess and osteomylitis  POST-OPERATIVE DIAGNOSIS:  left forefoot abscess and osteomylitis  Procedure(s):  Left 1st ray amputation  SURGEON:  Toni ArthursJohn Delita Chiquito, MD  ASSISTANT: n/a  ANESTHESIA:   General  EBL:  minimal   TOURNIQUET:   Total Tourniquet Time Documented: Thigh (Left) - 24 minutes Total: Thigh (Left) - 24 minutes   COMPLICATIONS:  None apparent  DISPOSITION:  Extubated, awake and stable to recovery.  DICTATION ID:  [id number]

## 2014-02-22 NOTE — Anesthesia Procedure Notes (Signed)
Procedure Name: LMA Insertion Date/Time: 02/22/2014 3:06 PM Performed by: Orvilla FusATO, Dotty Gonzalo A Pre-anesthesia Checklist: Patient identified, Emergency Drugs available, Suction available, Patient being monitored and Timeout performed Patient Re-evaluated:Patient Re-evaluated prior to inductionOxygen Delivery Method: Circle system utilized Preoxygenation: Pre-oxygenation with 100% oxygen Intubation Type: IV induction Ventilation: Mask ventilation without difficulty LMA: LMA inserted LMA Size: 4.0 Number of attempts: 1 Placement Confirmation: positive ETCO2 and breath sounds checked- equal and bilateral Tube secured with: Tape Dental Injury: Teeth and Oropharynx as per pre-operative assessment

## 2014-02-22 NOTE — Progress Notes (Signed)
Orthopedic Tech Progress Note Patient Details:  Cory Hansen 1920-12-17 [id number] Delivered post-op shoe to pt.'s nurse for use in AM. Ortho Devices Type of Ortho Device: Postop shoe/boot Ortho Device/Splint Location: LLE Ortho Device/Splint Interventions: Other (comment)   Lesle ChrisGilliland, Crista Nuon L 02/22/2014, 6:33 PM

## 2014-02-22 NOTE — Anesthesia Preprocedure Evaluation (Signed)
Anesthesia Evaluation  Patient identified by MRN, date of birth, ID band Patient awake    Reviewed: Allergy & Precautions, H&P , NPO status , Patient's Chart, lab work & pertinent test results  Airway Mallampati: I  TM Distance: >3 FB Neck ROM: Full    Dental   Pulmonary          Cardiovascular     Neuro/Psych    GI/Hepatic   Endo/Other    Renal/GU      Musculoskeletal   Abdominal   Peds  Hematology   Anesthesia Other Findings   Reproductive/Obstetrics                             Anesthesia Physical Anesthesia Plan  ASA: III  Anesthesia Plan: General   Post-op Pain Management:    Induction: Intravenous  Airway Management Planned: LMA  Additional Equipment:   Intra-op Plan:   Post-operative Plan: Extubation in OR  Informed Consent: I have reviewed the patients History and Physical, chart, labs and discussed the procedure including the risks, benefits and alternatives for the proposed anesthesia with the patient or authorized representative who has indicated his/her understanding and acceptance.     Plan Discussed with: CRNA and Surgeon  Anesthesia Plan Comments:         Anesthesia Quick Evaluation

## 2014-02-22 NOTE — H&P (Signed)
Cory Hansen is an 80 y.o. male.   Chief Complaint: Left forefoot infection HPI:  80 y/o male with injury to left forefoot when he stepped on a nail about two weeks ago.  He presents now for surgical treatment.  He is on abx currently and denies any pain.  He has a h/o neuropathy but is not diabetic.  Past Medical History  Diagnosis Date  . Arthritis   . Kidney stones   . Shingles     Past Surgical History  Procedure Laterality Date  . Eye surgery Bilateral     cataract surgery w/ lens implant  . Appendectomy    . Hernia repair      inguinal hernia repair    History reviewed. No pertinent family history. Social History:  reports that he has never smoked. He has never used smokeless tobacco. He reports that he drinks alcohol. He reports that he does not use illicit drugs.  Allergies: No Known Allergies  Medications Prior to Admission  Medication Sig Dispense Refill  . clindamycin (CLEOCIN) 300 MG capsule Take 1 capsule (300 mg total) by mouth 3 (three) times daily. First dose this evening (11/9) 13 capsule 0  . doxycycline (VIBRA-TABS) 100 MG tablet Take 1 tablet (100 mg total) by mouth 2 (two) times daily. 8 tablet 0  . glucosamine-chondroitin 500-400 MG tablet Take 1 tablet by mouth 3 (three) times daily.      No results found for this or any previous visit (from the past 48 hour(s)). No results found.  ROS  No recent f/c/n/v/wt loss  Blood pressure 134/65, pulse 81, temperature 97.4 F (36.3 C), temperature source Oral, height 5\' 10"  (1.778 m), weight 78.926 kg (174 lb), SpO2 100 %. Physical Exam  wn wd elderly male in nad.  A and O x 4.  Mood and affect normal.  EOMI.  resp unlabored.  Left forefoot with swelling and erythema.  Medial forefoot with blistered area.  Skin o/w intact.  No lymphadenopathy or lymphangiitis.  5/5 strength in PF and DF of the toes.  MRI shows abscess along 1st MT shaft and osteomyelitis at the 1st metatarsal and medial  sesamoid.  Assessment/Plan L forefoot abscess and osteomyelitis - to OR for I and D vs. 1st ray amputation.  I'm concerned that there will be insufficient soft tissue to heal after I and D, and he may require amputation.  The risks and benefits of the alternative treatment options have been discussed in detail.  The patient wishes to proceed with surgery and specifically understands risks of bleeding, infection, nerve damage, blood clots, need for additional surgery, amputation and death.   Toni ArthursHEWITT, Arnel Wymer 02/22/2014, 2:49 PM

## 2014-02-22 NOTE — Progress Notes (Signed)
ANTIBIOTIC CONSULT NOTE - INITIAL  Pharmacy Consult for Vancomycin Indication: left foot abscess & osteomyelitis; s/p left 1st ray amputation  No Known Allergies  Patient Measurements: Height: 5\' 10"  (177.8 cm) Weight: 174 lb (78.926 kg) IBW/kg (Calculated) : 73  Vital Signs: Temp: 98 F (36.7 C) (11/17 1735) Temp Source: Oral (11/17 1157) BP: 159/73 mmHg (11/17 1735) Pulse Rate: 68 (11/17 1735)  Labs from 02/11/14:   Scr 1.0,  WBC 8.7  Estimated Creatinine Clearance: 50.7 mL/min (by C-G formula based on Cr of 1).  Medical History: Past Medical History  Diagnosis Date  . Arthritis   . Kidney stones   . Shingles    Assessment:  80 yr old man who stepped on a nail ~ 2 weeks ago, but developed abscess and osteomyelitis. Was on Clindamycin and Doxycycline prior to admission.   Now s/p I&D and 1st ray amputation.  Tissue and abscess cultures sent.  Vancomycin 1 gram IV given pre-op ~3:30pm today and to continue post-op.  Goal of Therapy:  Vancomycin trough level 15-20 mcg/ml  Plan:   Vancomycin 1250 mg IV q24hrs; will begin on 11/18 am.  Will follow renal function, culture data and progress.  Will consider checking Vancomycin trough level at steady state.  Bmet and CBC in am.  Dennie Fettersgan, Madlynn Lundeen Donovan, RPh Pager: [phone] 02/22/2014,6:25 PM

## 2014-02-22 NOTE — Anesthesia Postprocedure Evaluation (Signed)
Anesthesia Post Note  Patient: Cory Hansen  Procedure(s) Performed: Procedure(s) (LRB): INCISION AND DRAINAGE LEFT FOREFOOT AMPUTATION FIRST RAY (Left)  Anesthesia type: general  Patient location: PACU  Post pain: Pain level controlled  Post assessment: Patient's Cardiovascular Status Stable  Last Vitals:  Filed Vitals:   02/22/14 1630  BP: 142/66  Pulse: 70  Temp:   Resp: 18    Post vital signs: Reviewed and stable  Level of consciousness: sedated  Complications: No apparent anesthesia complications

## 2014-02-23 ENCOUNTER — Encounter (HOSPITAL_COMMUNITY): Payer: Self-pay | Admitting: Orthopedic Surgery

## 2014-02-23 DIAGNOSIS — L02612 Cutaneous abscess of left foot: Principal | ICD-10-CM

## 2014-02-23 DIAGNOSIS — M86172 Other acute osteomyelitis, left ankle and foot: Secondary | ICD-10-CM

## 2014-02-23 DIAGNOSIS — M009 Pyogenic arthritis, unspecified: Secondary | ICD-10-CM

## 2014-02-23 LAB — BASIC METABOLIC PANEL
Anion gap: 9 (ref 5–15)
BUN: 14 mg/dL (ref 6–23)
CO2: 26 mEq/L (ref 19–32)
Calcium: 9.1 mg/dL (ref 8.4–10.5)
Chloride: 101 mEq/L (ref 96–112)
Creatinine, Ser: 1.01 mg/dL (ref 0.50–1.35)
GFR calc Af Amer: 73 mL/min — ABNORMAL LOW (ref >90)
GFR calc non Af Amer: 63 mL/min — ABNORMAL LOW (ref >90)
Glucose, Bld: 99 mg/dL (ref 70–99)
Potassium: 5.1 mEq/L (ref 3.7–5.3)
Sodium: 136 mEq/L — ABNORMAL LOW (ref 137–147)

## 2014-02-23 LAB — CBC
HCT: 37.8 % — ABNORMAL LOW (ref 39.0–52.0)
Hemoglobin: 11.9 g/dL — ABNORMAL LOW (ref 13.0–17.0)
MCH: 29.8 pg (ref 26.0–34.0)
MCHC: 31.5 g/dL (ref 30.0–36.0)
MCV: 94.7 fL (ref 78.0–100.0)
Platelets: 487 10*3/uL — ABNORMAL HIGH (ref 150–400)
RBC: 3.99 MIL/uL — ABNORMAL LOW (ref 4.22–5.81)
RDW: 13 % (ref 11.5–15.5)
WBC: 9.4 10*3/uL (ref 4.0–10.5)

## 2014-02-23 LAB — SEDIMENTATION RATE: Sed Rate: 70 mm/hr — ABNORMAL HIGH (ref 0–16)

## 2014-02-23 LAB — C-REACTIVE PROTEIN: CRP: 3.3 mg/dL — ABNORMAL HIGH (ref <0.60)

## 2014-02-23 MED ORDER — SODIUM CHLORIDE 0.9 % IJ SOLN
10.0000 mL | INTRAMUSCULAR | Status: DC | PRN
  Administered 2014-02-24: 10 mL
  Filled 2014-02-23: qty 40

## 2014-02-23 NOTE — Op Note (Signed)
NAMLyman Speller:  Hansen, Cory                ACCOUNT NO.:  [id number]  MEDICAL RECORD NO.:  [id number]  LOCATION:  [id number]                        FACILITY:  [facility]  PHYSICIAN:  Toni ArthursJohn Senta Kantor, MD        DATE OF BIRTH:  1920-05-16  DATE OF PROCEDURE:  02/22/2014 DATE OF DISCHARGE:                              OPERATIVE REPORT   PREOPERATIVE DIAGNOSES:  Left forefoot abscess and osteomyelitis.  POSTOPERATIVE DIAGNOSES:  Left forefoot abscess and osteomyelitis.  PROCEDURE:  Left first ray amputation.  SURGEON:  Toni ArthursJohn Randell Detter, MD  ANESTHESIA:  General.  ESTIMATED BLOOD LOSS:  Minimal.  TOURNIQUET TIME:  24 minutes at 250 mmHg.  COMPLICATIONS:  None apparent.  DISPOSITION:  Extubated, awake, and stable to recovery.  INDICATIONS FOR PROCEDURE:  The patient is a 80 year old male without significant past medical history.  He was working approximately 2 weeks ago when he stepped on a nail, which went to his shoe into the left forefoot.  [facility] was [facility] at [facility] and prescribed antibiotics. Antibiotics were later switched at a doctor's visit.  He then presented with a swollen, tender, erythematous forefoot.  An MRI was obtained showing osteomyelitis and an abscess.  He presents now for operative treatment of this condition.  He understands the risks and benefits, the alternative treatment options, and elects surgical treatment.  He specifically understands the risks of bleeding, infection, nerve damage, blood clots, needing additional surgery, amputation, and death.  PROCEDURE IN DETAIL:  After preoperative consent was obtained and the correct operative site was identified, the patient was brought to the operating room and placed supine on the operating table.  General anesthesia was induced.  The patient was already on therapeutic antibiotics, but preop antibiotics were held for cultures.  A surgical time-out was taken.  The left lower extremity was then prepped and draped in standard sterile  fashion with a tourniquet around the thigh. The extremity was exsanguinated and the tourniquet was inflated to 250 mmHg.  A racquet style incision was marked on the skin at the base of the toe.  The medial incision was made and sharp dissection was carried down through the skin and subcutaneous tissue and an arthrotomy was made exposing the MTP joint.  Purulent fluid was noted within the joint and this communicated with an abscess area at the plantar medial aspect of the forefoot adjacent to the 1st MTP joint and the medial sesamoid.  The bone at the head of the metatarsal as well as the base of the proximal phalanx was extremely soft and appeared to be infected grossly.  The decision was made at that point to proceed with a 1st ray amputation given the apparent osteomyelitis of the proximal phalanx and the first metatarsal.  Subperiosteal dissection was then carried along the metatarsal, the toe was disarticulated through the MTP joint.  The metatarsal was disarticulated through the first TMT joint.  Lateral to the first metatarsal shaft was a large area of abscess with purulence and necrotic material.  This was all debrided sharply with scalpel, rongeur, and curettes.  The wound was then irrigated copiously with 3 L of normal saline.  The neurovascular bundles were all cauterized.  The deep tissue had been removed and sent as a specimen to Microbiology.  IV antibiotics were then administered.  The wound was then closed with simple horizontal 3-0 nylon sutures as well as some 3-0 Monocryl deep sutures to close the dead space.  Compression dressing was applied. Tourniquet was released at 25 minutes.  The patient was awakened from anesthesia and transported to the recovery room in stable condition.  FOLLOWUP PLAN:  The patient will be weightbearing as tolerated on his left foot in a postop shoe.  He will have Infectious Disease consultation to determine the optimal antibiotic  regimen.     Toni ArthursJohn Larence Thone, MD     JH/MEDQ  D:  02/22/2014  T:  02/23/2014  Job:  [id number]

## 2014-02-23 NOTE — Care Management Note (Signed)
02/22/14 1500 Vance PeperSusan Nakeda Lebron, RN BSN Case Manager Case manager spoke with patient's daughterTalbert Forest- Shirley. She states her dad will be staying at her [address] , , [place]  [phone]. Talbert ForestShirley states she is in the process of preparing her home. Patient's daughter Elease Hashimotoatricia will also assist in patient's care. Her number is [phone].

## 2014-02-23 NOTE — Progress Notes (Signed)
Utilization review completed.  

## 2014-02-23 NOTE — Progress Notes (Signed)
Peripherally Inserted Central Catheter/Midline Placement  The IV Nurse has discussed with the patient and/or persons authorized to consent for the patient, the purpose of this procedure and the potential benefits and risks involved with this procedure.  The benefits include less needle sticks, lab draws from the catheter and patient may be discharged home with the catheter.  Risks include, but not limited to, infection, bleeding, blood clot (thrombus formation), and puncture of an artery; nerve damage and irregular heat beat.  Alternatives to this procedure were also discussed.  PICC/Midline Placement Documentation        Cory Hansen, Cory Hansen 02/23/2014, 7:09 PM

## 2014-02-23 NOTE — Consult Note (Signed)
[facility]  Total days of antibiotics 2        Day 2 vanco               Reason for Consult: osteomyelitis and deep abscess to left foot s/p 1st ray amputation    Referring Physician: hewitt  Active Problems:   Abscess of left foot    HPI: Cory Hansen is a 80 y.o. male  with a hx of HLD and bladder cancer, spinal stenosis c/b neuropathy who sustained a left foot injury by stepping on a nail that punctured his foot on 10/31. He presented to Urgent care on 11/3 for evaluation of edema and erythema in the left foot that progressed over the last 4 days. He was given tdap booster plus CTX x1 plus 3 days of amox/clav. He was then admitted to [facility] service from 11/6-11/9 for worsening cellulitis. He was given clindamycin IV and doxycycline, then transitioned to orals for addn 7 days. Cellulitis improved. Initially baseline xray did not show osteomyelitis. He did have MRI on 02/16/14 that showed evidence of 1st MTP joint sepitc arthritis and 1st MTH osteomyelitis, as well as abscess over the dorsum of 1st MTP to intermetatarsal space. He was seen by Dr. Doran Durand who admitted on 11/17 for I x D with possible 1st ray amputation.  Patient is POD#1 from 1st ray amputation and debridement of abscess. OR note suggests purulent pocket in the MTP joint. cx sent however, patient had been on doxy and clinda prior to surgery x 10 days. Post operatively he was placed on vancomycin   Past Medical History  Diagnosis Date  . Arthritis   . Kidney stones   . Shingles     Allergies: No Known Allergies   MEDICATIONS: . docusate sodium  100 mg Oral BID  . enoxaparin (LOVENOX) injection  40 mg Subcutaneous Q24H  . senna  1 tablet Oral BID  . vancomycin  1,250 mg Intravenous Q24H    History  Substance Use Topics  . Smoking status: Never Smoker   . Smokeless tobacco: Never Used  . Alcohol Use: 0.0 oz/week    0 Not specified per week     Comment: very rare    History  reviewed. No pertinent family history.  Review of Systems Constitutional: Negative for fever, chills, diaphoresis, activity change, appetite change, fatigue and unexpected weight change.  HENT: Negative for congestion, sore throat, rhinorrhea, sneezing, trouble swallowing and sinus pressure.  Eyes: Negative for photophobia and visual disturbance.  Respiratory: Negative for cough, chest tightness, shortness of breath, wheezing and stridor.  Cardiovascular: Negative for chest pain, palpitations and leg swelling.  Gastrointestinal: Negative for nausea, vomiting, abdominal pain, diarrhea, constipation, blood in stool, abdominal distention and anal bleeding.  Genitourinary: Negative for dysuria, hematuria, flank pain and difficulty urinating.  Musculoskeletal: occ left leg spasm Skin: Negative for color change, pallor, rash and wound.  Neurological: Negative for dizziness, tremors, weakness and light-headedness.  Hematological: Negative for adenopathy. Does not bruise/bleed easily.  Psychiatric/Behavioral: Negative for behavioral problems, confusion, sleep disturbance, dysphoric mood, decreased concentration and agitation.    OBJECTIVE: Temp:  [97.4 F (36.3 C)-98.4 F (36.9 C)] 98.4 F (36.9 C) (11/18 0518) Pulse Rate:  [65-81] 69 (11/18 0518) Resp:  [11-18] 15 (11/18 0518) BP: (112-159)/(59-73) 128/60 mmHg (11/18 0518) SpO2:  [97 %-100 %] 100 % (11/17 2055) Weight:  [174 lb (78.926 kg)] 174 lb (78.926 kg) (11/17 1157) Physical Exam  Constitutional: He is oriented  to person, place, and time. He appears well-developed and well-nourished. No distress.  HENT:  Mouth/Throat: Oropharynx is clear and moist. No oropharyngeal exudate.  Cardiovascular: Normal rate, regular rhythm and normal heart sounds. Exam reveals no gallop and no friction rub.  No murmur heard.  Pulmonary/Chest: Effort normal and breath sounds normal. No respiratory distress. He has no wheezes.  Abdominal: Soft. Bowel sounds  are normal. He exhibits no distension. There is no tenderness.  Lymphadenopathy:  He has no cervical adenopathy.  Neurological: He is alert and oriented to person, place, and time.  Skin: Skin is warm and dry. No rash noted. No erythema.  Psychiatric: He has a normal mood and affect. His behavior is normal.  Ext: left foot wrapped, 1st hallux removal  LABS: Results for orders placed or performed during the hospital encounter of 02/22/14 (from the past 48 hour(s))  Culture, routine-abscess     Status: None (Preliminary result)   Collection Time: 02/22/14  3:24 PM  Result Value Ref Range   Specimen Description ABSCESS FOOT LEFT    Special Requests NONE    Gram Stain PENDING    Culture NO GROWTH Performed at [facility]     Report Status PENDING   Tissue culture     Status: None (Preliminary result)   Collection Time: 02/22/14  3:34 PM  Result Value Ref Range   Specimen Description TISSUE FOOT LEFT    Special Requests NONE    Gram Stain PENDING    Culture NO GROWTH Performed at [facility]     Report Status PENDING   Basic metabolic panel     Status: Abnormal   Collection Time: 02/23/14  5:50 AM  Result Value Ref Range   Sodium 136 (L) 137 - 147 mEq/L   Potassium 5.1 3.7 - 5.3 mEq/L   Chloride 101 96 - 112 mEq/L   CO2 26 19 - 32 mEq/L   Glucose, Bld 99 70 - 99 mg/dL   BUN 14 6 - 23 mg/dL   Creatinine, Ser 1.01 0.50 - 1.35 mg/dL   Calcium 9.1 8.4 - 10.5 mg/dL   GFR calc non Af Amer 63 (L) >90 mL/min   GFR calc Af Amer 73 (L) >90 mL/min    Comment: (NOTE) The eGFR has been calculated using the CKD EPI equation. This calculation has not been validated in all clinical situations. eGFR's persistently <90 mL/min signify possible Chronic Kidney Disease.    Anion gap 9 5 - 15  CBC     Status: Abnormal   Collection Time: 02/23/14  5:50 AM  Result Value Ref Range   WBC 9.4 4.0 - 10.5 K/uL   RBC 3.99 (L) 4.22 - 5.81 MIL/uL   Hemoglobin 11.9 (L) 13.0 - 17.0  g/dL   HCT 37.8 (L) 39.0 - 52.0 %   MCV 94.7 78.0 - 100.0 fL   MCH 29.8 26.0 - 34.0 pg   MCHC 31.5 30.0 - 36.0 g/dL   RDW 13.0 11.5 - 15.5 %   Platelets 487 (H) 150 - 400 K/uL     MICRO: 11/17 wound cx/tissue cx PENDING IMAGING:  MRI of Left Foot on 02/16/14 1. Findings most compatible with first MTP joint septic arthritis and osteomyelitis of the first metatarsal head in the setting of previous penetrating trauma. There may be a nondisplaced fracture in the plantar aspect of the first metatarsal head (image 13 series 6). Osteomyelitis involves the bipartite great toe medial sesamoid bone. 2. Serpentine fluid collection consistent  with abscess over the dorsum of the first MTP joint extending into the first intermetatarsal space to the level of the proximal first metatarsal diaphysis.   Assessment/Plan:  80 yo M with Left 1st MTH osteomyelitis and septic arthritis with deep tissue infection POD#1 s/p 1st ray amputation and debridement  - since patient had been on antibiotics prior to surgery, there is strong likelihood that we will not be able to isolate pathogen from OR cx from 11/17 - still likely has some residual infected tissue but majority has been debulked.  - will recommend that we give him IV vancomycin (due to response with doxycycline, clinda) x 14 days, then can convert to doxycycline if surgical site still appears slowly healing - will need home health with weekly lab draw of cbc, bmp, vanco trough. vanco trough goal of 15-20 - will check sed rate and crp - will get picc line placed today - will arrange for follow up in the ID clinic in 2 wk  [facility] [phone]

## 2014-02-23 NOTE — Progress Notes (Signed)
Subjective: 1 Day Post-Op Procedure(s) (LRB): INCISION AND DRAINAGE LEFT FOREFOOT AMPUTATION FIRST RAY (Left) [name] is a very pleasant gentleman who reports pain as mild.  He reports that he is feeling very well today and has no complaints. He self-reports not having to take much pain medicine overnight. He inquires about being able to get up and walk. His appetite has returned and he reports normal bladder function/urination. Patient denies any new HA, CP, SOB, N, V, fever, chills, changes in appetite, calf pain or swelling.  He is currently on IV vancomycin with ID consult pending.    Objective: Vital signs in last 24 hours: Temp:  [97.4 F (36.3 C)-98.4 F (36.9 C)] 98.4 F (36.9 C) (11/18 0518) Pulse Rate:  [65-81] 69 (11/18 0518) Resp:  [11-18] 15 (11/18 0518) BP: (112-159)/(59-73) 128/60 mmHg (11/18 0518) SpO2:  [97 %-100 %] 100 % (11/17 2055) Weight:  [78.926 kg (174 lb)] 78.926 kg (174 lb) (11/17 1157)  Intake/Output from previous day: 11/17 0701 - 11/18 0700 In: 982.5 [I.V.:982.5] Out: 800 [Urine:800] Intake/Output this shift:    No results for input(s): HGB in the last 72 hours. No results for input(s): WBC, RBC, HCT, PLT in the last 72 hours. No results for input(s): NA, K, CL, CO2, BUN, CREATININE, GLUCOSE, CALCIUM in the last 72 hours. No results for input(s): LABPT, INR in the last 72 hours.  Physical Exam: WD/WN elderly, Caucasian male in nad. A and O x4. Mood and affect appropriate. EOMI. Respirations normal and unlabored. Abdomen soft and non-tender. L foot in soft dressing with evidence of 1st ray amputation. Dressings C/D/I. NV intact with brisk capillary refill and 5/5 strength of ankle DF/PF bilaterally.  He has 5/5 strength of remaining toe extensors and flexors to the L foot. Distal sensation intact bilaterally. No lymphadenopathy.  No calf swelling or palpable cords.   Assessment/Plan: 1 Day Post-Op Procedure(s) (LRB): INCISION AND DRAINAGE LEFT  FOREFOOT AMPUTATION FIRST RAY (Left) -WBAT to LLE, in post-op shoe -Awaiting ID consult/ recs for ABX tx; cultures pending.  Continue on IV vanc for now. -Continue pain medications prn -On lovenox for DVT prophylaxis    Vercie Pokorny HOWELLS 02/23/2014, 7:05 AM  [phone]

## 2014-02-23 NOTE — Plan of Care (Signed)
Problem: Consults Goal: Diabetes Guidelines if Diabetic/Glucose > 140 If diabetic or lab glucose is > 140 mg/dl - Initiate Diabetes/Hyperglycemia Guidelines & Document Interventions  Outcome: Completed/Met Date Met:  02/23/14  Problem: Phase I Progression Outcomes Goal: Pain controlled with appropriate interventions Outcome: Completed/Met Date Met:  02/23/14 Goal: Incision/dressings dry and intact Outcome: Completed/Met Date Met:  02/23/14 Goal: Voiding-avoid urinary catheter unless indicated Outcome: Completed/Met Date Met:  02/23/14

## 2014-02-23 NOTE — Discharge Instructions (Signed)
Cory ArthursJohn Hewitt, MD [facility]  Please read the following information regarding your care after surgery.  Medications  You only need a prescription for the narcotic pain medicine (ex. oxycodone, Percocet, Norco).  All of the other medicines listed below are available over the counter. x acetominophen (Tylenol) 650 mg every 4-6 hours as you need for minor pain; ONLY AFTER YOU DISCONTINUE HYDROCODONE x hydrocodone as prescribed for moderate to severe pain    Narcotic pain medicine (ex. oxycodone, Percocet, Vicodin) will cause constipation.  To prevent this problem, take the following medicines while you are taking any pain medicine. X docusate sodium (Colace) 100 mg twice a day X senna (Senokot) 2 tablets twice a day   Weight Bearing X Bear weight when you are able on your operated leg or foot.  Cast / Splint / Dressing X Keep your dressing clean and dry.  Dont put anything (coat hanger, pencil, etc) down inside of it.  If it gets damp, use a hair dryer on the cool setting to dry it.  If it gets soaked, call the office to schedule an appointment for a cast change.  After your dressing, cast or splint is removed; you may shower, but do not soak or scrub the wound.  Allow the water to run over it, and then gently pat it dry.  Swelling It is normal for you to have swelling where you had surgery.  To reduce swelling and pain, keep your toes above your nose for at least 3 days after surgery.  It may be necessary to keep your foot or leg elevated for several weeks.  If it hurts, it should be elevated.  Follow Up Call my office at [phone] when you are discharged from the hospital or surgery center to schedule an appointment to be seen two weeks after surgery.  Call my office at [phone] if you develop a fever >101.5 F, nausea, vomiting, bleeding from the surgical site or severe pain.

## 2014-02-23 NOTE — Care Management Note (Signed)
CARE MANAGEMENT NOTE 02/23/2014  Patient:  Donnelly AngelicaSENHOUR,Jervon L   Account Number:  [id number]  Date Initiated:  02/23/2014  Documentation initiated by:  Vance PeperBRADY,Nariya Neumeyer  Subjective/Objective Assessment:   80 yr old male admitted with a left forefoot abscess and osteomyelitis. Patient had a left great toe amputation.     Action/Plan:   Case manager spoke with patient concerning need for Home Health RN for IV antibiotics. Choice offered.Referral called to Tamala BariMary Manley, [facility] Liaison. Patient states he will go home with his daughter Talbert ForestShirley.   Anticipated DC Date:  02/24/2014   Anticipated DC Plan:  HOME W HOME HEALTH SERVICES      DC Planning Services  CM consult      [facility]   Choice offered to / List presented to:  C-1 Patient        HH arranged  HH-1 RN  IV Antibiotics      HH agency  [facility]   Status of service:  In process, will continue to follow Medicare Important Message given?   (If response is "NO", the following Medicare IM given date fields will be blank) Date Medicare IM given:   Medicare IM given by:   Date Additional Medicare IM given:   Additional Medicare IM given by:    Discharge Disposition:    Per UR Regulation:  Reviewed for med. necessity/level of care/duration of stay

## 2014-02-24 LAB — BASIC METABOLIC PANEL
Anion gap: 11 (ref 5–15)
BUN: 12 mg/dL (ref 6–23)
CO2: 26 mEq/L (ref 19–32)
Calcium: 8.9 mg/dL (ref 8.4–10.5)
Chloride: 101 mEq/L (ref 96–112)
Creatinine, Ser: 0.93 mg/dL (ref 0.50–1.35)
GFR calc Af Amer: 83 mL/min — ABNORMAL LOW (ref >90)
GFR calc non Af Amer: 72 mL/min — ABNORMAL LOW (ref >90)
Glucose, Bld: 93 mg/dL (ref 70–99)
Potassium: 4.5 mEq/L (ref 3.7–5.3)
Sodium: 138 mEq/L (ref 137–147)

## 2014-02-24 MED ORDER — SODIUM CHLORIDE 0.9 % IV SOLN: 1250.0000 mg | INTRAVENOUS | Status: DC

## 2014-02-24 MED ORDER — HYDROCODONE-ACETAMINOPHEN 5-325 MG PO TABS: 1.0000 | ORAL_TABLET | Freq: Four times a day (QID) | ORAL | Status: DC | PRN

## 2014-02-24 NOTE — Progress Notes (Signed)
    [facility]    Date of Admission:  02/22/2014   Total days of antibiotics 2        Day 2 vancomycin           ID: Cory Hansen is a 80 y.o. male with left foot osteo s/p 1st ray amputation and debridement, cultures negative Active Problems:   Abscess of left foot    Subjective: Afebrile, picc line placed without difficulty  Medications:  . docusate sodium  100 mg Oral BID  . enoxaparin (LOVENOX) injection  40 mg Subcutaneous Q24H  . senna  1 tablet Oral BID  . vancomycin  1,250 mg Intravenous Q24H    Objective: Vital signs in last 24 hours: Temp:  [98.2 F (36.8 C)-99 F (37.2 C)] 98.3 F (36.8 C) (11/19 1300) Pulse Rate:  [72-76] 72 (11/19 1300) BP: (104-128)/(54-61) 128/54 mmHg (11/19 1300) SpO2:  [96 %-98 %] 98 % (11/19 1300) gen = younger than stated age Ext = left foot wrapped, moves all 4 toes, no erythema Right PICC line c/d/i Lab Results  Recent Labs  02/23/14 0550 02/24/14 0420  WBC 9.4  --   HGB 11.9*  --   HCT 37.8*  --   NA 136* 138  K 5.1 4.5  CL 101 101  CO2 26 26  BUN 14 12  CREATININE 1.01 0.93   Liver Panel No results for input(s): PROT, ALBUMIN, AST, ALT, ALKPHOS, BILITOT, BILIDIR, IBILI in the last 72 hours. Sedimentation Rate  Recent Labs  02/23/14 1223  ESRSEDRATE 70*   C-Reactive Protein  Recent Labs  02/23/14 1030  CRP 3.3*    Microbiology: 11/17 tissue cx ngtd Studies/Results: No results found.   Assessment/Plan: Left foot osteomyelitis s/p 1st ray amputation =recommend 2 wk of Iv vancomycin. Goal trough is 15-20. Next trough is due at 9:30am. Still needs home health education  Will need weekly vanco trough, bmp, cbc. If dosage is changed, recommend to check vanco trough before 4th dose Will see back in id clinic in 2wk to see if need to give oral antibiotics  Randal Goens, [facility] Cell: [phone] Pager: [phone]  02/24/2014, 3:04  PM

## 2014-02-24 NOTE — Plan of Care (Signed)
Problem: Phase I Progression Outcomes Goal: OOB as tolerated unless otherwise ordered Outcome: Completed/Met Date Met:  02/24/14

## 2014-02-24 NOTE — Plan of Care (Signed)
Problem: Phase II Progression Outcomes Goal: Progress activity as tolerated unless otherwise ordered Outcome: Completed/Met Date Met:  02/24/14 Goal: Progressing with IS, TCDB Outcome: Completed/Met Date Met:  02/24/14 Goal: Discharge plan established Outcome: Completed/Met Date Met:  02/24/14 Goal: Tolerating diet Outcome: Completed/Met Date Met:  02/24/14

## 2014-02-24 NOTE — Plan of Care (Signed)
Problem: Phase I Progression Outcomes Goal: Initial discharge plan identified Outcome: Completed/Met Date Met:  02/24/14     

## 2014-02-24 NOTE — Discharge Summary (Signed)
Physician Discharge Summary  Patient ID: Cory Hansen MRN: [id number] DOB/AGE: [birth year] 80 y.o.  Admit date: 02/22/2014 Discharge date: 02/24/2014  Admission Diagnoses:  Left foot osteomyelitis and abscess  Discharge Diagnoses:  Active Problems:   Abscess of left foot s/p Left first ray amputation  Discharged Condition: stable  Hospital Course: Pt was admitted and taken tot he OR where he underwent left first ray amputation.  He tolerated the procedure well and was admitted to 5N.  Post op he was seen by Dr. Drue SecondSnider of ID.  He continued on IV vanc and is discharged on vanc for two weeks.  He'll f/u with dr. Drue SecondSnider in clinic.  Consults: ID  Significant Diagnostic Studies: none  Treatments: surgery: as above  Discharge Exam: Blood pressure 128/54, pulse 72, temperature 98.3 F (36.8 C), temperature source Oral, resp. rate 16, height 5\' 10"  (1.778 m), weight 78.926 kg (174 lb), SpO2 98 %. wn wd male in nad.  A and O x 4.  Gait is normal.  left foot is dressed and dry.  Disposition: 01-Home or Self Care  Discharge Instructions    Call MD / Call 911    Complete by:  As directed   If you experience chest pain or shortness of breath, CALL 911 and be transported to the hospital emergency room.  If you develope a fever above 101 F, pus (white drainage) or increased drainage or redness at the wound, or calf pain, call your surgeon's office.     Constipation Prevention    Complete by:  As directed   Drink plenty of fluids.  Prune juice may be helpful.  You may use a stool softener, such as Colace (over the counter) 100 mg twice a day.  Use MiraLax (over the counter) for constipation as needed.     Diet - low sodium heart healthy    Complete by:  As directed      Increase activity slowly as tolerated    Complete by:  As directed             Medication List    STOP taking these medications        clindamycin 300 MG capsule  Commonly known as:  CLEOCIN     doxycycline 100  MG tablet  Commonly known as:  VIBRA-TABS      TAKE these medications        glucosamine-chondroitin 500-400 MG tablet  Take 1 tablet by mouth 3 (three) times daily.     HYDROcodone-acetaminophen 5-325 MG per tablet  Commonly known as:  NORCO/VICODIN  Take 1 tablet by mouth every 6 (six) hours as needed for moderate pain.     vancomycin 1,250 mg in sodium chloride 0.9 % 250 mL  Inject 1,250 mg into the vein daily.           Follow-up Information    Follow up with Toni ArthursHEWITT, Cashis Rill, MD In 2 weeks.   Specialty:  Orthopedic Surgery   Why:  For wound re-check   Contact information:   [address] [place] [address] [phone]       [facility]   Specialty:  Home Health Services   Why:  [facility] will contact you concerning start date and time for physical therapy.Benay Pillow.   Contact information:   [address] [place] [address] [phone]       Signed: Toni ArthursHEWITT, Jerral Mccauley 02/24/2014, 4:26 PM

## 2014-02-24 NOTE — Plan of Care (Signed)
Problem: Phase I Progression Outcomes Goal: Vital signs/hemodynamically stable Outcome: Completed/Met Date Met:  02/24/14  Problem: Phase II Progression Outcomes Goal: Pain controlled Outcome: Completed/Met Date Met:  02/24/14 Goal: Vital signs stable Outcome: Completed/Met Date Met:  02/24/14 Goal: Dressings dry/intact Outcome: Completed/Met Date Met:  02/24/14

## 2014-02-24 NOTE — Plan of Care (Signed)
Problem: Phase I Progression Outcomes Goal: Other Phase I Outcomes/Goals Outcome: Completed/Met Date Met:  02/24/14     

## 2014-02-24 NOTE — Progress Notes (Signed)
Call placed to Dr. Victorino DikeHewitt regarding discharge order for patient entered today at 16:26. Patient is due for a Vancomycin trough in on 02/25/14 A.M. Prior to Vancomycin IV infusion at 10:00. Home Health is planning on seeing patient later in the afternoon on 02/25/14 per Case Manager Durenda GuthrieSusan Naomi Brady RN. Will await return call.

## 2014-02-24 NOTE — Care Management Note (Signed)
CARE MANAGEMENT NOTE 02/24/2014  Patient:  Cory Hansen,Cory Hansen   Account Number:  [id number]  Date Initiated:  02/23/2014  Documentation initiated by:  Vance PeperBRADY,Blandina Renaldo  Subjective/Objective Assessment:   80 yr old male admitted with a left forefoot abscess and osteomyelitis. Patient had a left great toe amputation.     Action/Plan:   Case manager spoke with patient concerning need for Home Health RN for IV antibiotics. Choice offered.Referral called to Tamala BariMary Manley, [facility] Liaison. Patient states he will go home with his daughter Cory Hansen.   Anticipated DC Date:  02/25/2014   Anticipated DC Plan:  HOME W HOME HEALTH SERVICES      DC Planning Services  CM consult      [facility]   Choice offered to / List presented to:  C-1 Patient        HH arranged  HH-1 RN  IV Antibiotics      HH agency  [facility]   Status of service:  Completed, signed off Medicare Important Message given?  YES (If response is "NO", the following Medicare IM given date fields will be blank) Date Medicare IM given:  02/24/2014 Medicare IM given by:  Vance PeperBRADY,Yaslyn Cumby Date Additional Medicare IM given:   Additional Medicare IM given by:    Discharge Disposition:  HOME W HOME HEALTH SERVICES  Per UR Regulation:  Reviewed for med. necessity/level of care/duration of stay  If discussed at Long Length of Stay Meetings, dates discussed:    Comments:  02/22/14 1500 Vance PeperSusan Milledge Gerding, RN BSN Case Manager Case manager spoke with patient's daughterTalbert Forest- Hansen. She states her dad will be staying at her home-[address] , [place], [place]  [phone]. Cory Hansen states she is in the process of preparing her home. Patient's daughter Cory Hansen will also assist in patient's care. Her number is [phone].

## 2014-02-25 LAB — VANCOMYCIN, TROUGH: Vancomycin Tr: 9.1 ug/mL — ABNORMAL LOW (ref 10.0–20.0)

## 2014-02-25 MED ORDER — VANCOMYCIN HCL 10 G IV SOLR: 1500.0000 mg | INTRAVENOUS | Status: DC

## 2014-02-25 MED ORDER — VANCOMYCIN HCL 10 G IV SOLR: 1500.0000 mg | INTRAVENOUS | Status: AC

## 2014-02-25 NOTE — Plan of Care (Signed)
Problem: Phase II Progression Outcomes Goal: Surgical site without signs of infection Outcome: Not Applicable Date Met:  93/71/69 d4essing in place. Unable to assess

## 2014-02-25 NOTE — Plan of Care (Signed)
Problem: Phase II Progression Outcomes Goal: Other Phase II Outcomes/Goals Outcome: Completed/Met Date Met:  02/25/14

## 2014-02-25 NOTE — Progress Notes (Signed)
ANTIBIOTIC CONSULT NOTE - INITIAL  Pharmacy Consult for Vancomycin Indication: left foot abscess & osteomyelitis; s/p left 1st ray amputation  No Known Allergies  Patient Measurements: Height: 5\' 10"  (177.8 cm) Weight: 174 lb (78.926 kg) IBW/kg (Calculated) : 73  Vital Signs: Temp: 97.8 F (36.6 C) (11/20 0630) Temp Source: Oral (11/20 0630) BP: 123/66 mmHg (11/20 0630) Pulse Rate: 67 (11/20 0630)  Labs from 02/11/14:   Scr 1.0,  WBC 8.7  Estimated Creatinine Clearance: 53.4 mL/min (by C-G formula based on Cr of 0.93).  Medical History: Past Medical History  Diagnosis Date  . Arthritis   . Kidney stones   . Shingles    Assessment:  80 yr old man who stepped on a nail ~ 2 weeks ago, but developed abscess and osteomyelitis. Was on Clindamycin and Doxycycline prior to admission.   Now s/p I&D and 1st ray amputation.  Tissue and abscess cultures sent. Vanc trough came back at 9.1 this AM. Will adjust dose to complete 14 days  Goal of Therapy:  Vancomycin trough level 15-20 mcg/ml  Plan:    Change Vancomycin 1500 mg IV q24hrs  If dc check another trough sometimes next week  Ulyses SouthwardMinh Niomi Valent, PharmD Pager: [phone] 02/25/2014 1:09 PM

## 2014-02-25 NOTE — Plan of Care (Signed)
Problem: Phase I Progression Outcomes Goal: Sutures/staples intact Outcome: Not Applicable Date Met:  70/76/15 Dressing in place.

## 2014-02-25 NOTE — Plan of Care (Signed)
Problem: Phase I Progression Outcomes Goal: Tubes/drains patent Outcome: Completed/Met Date Met:  02/25/14  Problem: Phase II Progression Outcomes Goal: Return of bowel function (flatus, BM) IF ABDOMINAL SURGERY:  Outcome: Completed/Met Date Met:  02/25/14

## 2014-02-25 NOTE — Plan of Care (Signed)
Problem: Phase III Progression Outcomes Goal: Pain controlled on oral analgesia Outcome: Not Applicable Date Met:  69/67/89 Patient states no pain. Goal: Activity at appropriate level-compared to baseline (UP IN CHAIR FOR HEMODIALYSIS)  Outcome: Completed/Met Date Met:  02/25/14 Goal: Voiding independently Outcome: Completed/Met Date Met:  02/25/14 Goal: IV changed to normal saline lock Outcome: Completed/Met Date Met:  02/25/14 Goal: Nasogastric tube discontinued Outcome: Completed/Met Date Met:  02/25/14 Goal: Discharge plan remains appropriate-arrangements made Outcome: Completed/Met Date Met:  02/25/14 Goal: Demonstrates TCDB, IS independently Outcome: Completed/Met Date Met:  02/25/14 Goal: Other Phase III Outcomes/Goals Outcome: Completed/Met Date Met:  02/25/14  Problem: Discharge Progression Outcomes Goal: Barriers To Progression Addressed/Resolved Outcome: Completed/Met Date Met:  02/25/14 Goal: Discharge plan in place and appropriate Outcome: Completed/Met Date Met:  02/25/14 Goal: Pain controlled with appropriate interventions Outcome: Not Applicable Date Met:  38/10/17 Pain states he has no pain. Goal: Hemodynamically stable Outcome: Completed/Met Date Met:  51/02/58 Goal: Complications resolved/controlled Outcome: Completed/Met Date Met:  02/25/14 Goal: Tolerating diet Outcome: Completed/Met Date Met:  02/25/14 Goal: Activity appropriate for discharge plan Outcome: Completed/Met Date Met:  02/25/14 Goal: Tubes and drains discontinued if indicated Outcome: Not Applicable Date Met:  52/77/82 Goal: Staples/sutures removed Outcome: Not Applicable Date Met:  42/35/36 Goal: Steri-Strips applied Outcome: Not Applicable Date Met:  14/43/15 Goal: Other Discharge Outcomes/Goals Outcome: Completed/Met Date Met:  02/25/14

## 2014-02-25 NOTE — Plan of Care (Signed)
Problem: Phase II Progression Outcomes Goal: Sutures/staples intact Outcome: Not Applicable Date Met:  84/41/71 Dressing in place. Unable to assess.

## 2014-02-25 NOTE — Plan of Care (Signed)
Problem: Phase II Progression Outcomes Goal: Foley discontinued Outcome: Not Applicable Date Met:  34/37/35

## 2014-02-25 NOTE — Progress Notes (Signed)
Left great toe amputation

## 2014-02-26 DIAGNOSIS — Z452 Encounter for adjustment and management of vascular access device: Secondary | ICD-10-CM | POA: Diagnosis not present

## 2014-02-26 DIAGNOSIS — Z792 Long term (current) use of antibiotics: Secondary | ICD-10-CM | POA: Diagnosis not present

## 2014-02-26 DIAGNOSIS — G629 Polyneuropathy, unspecified: Secondary | ICD-10-CM | POA: Diagnosis not present

## 2014-02-26 DIAGNOSIS — Z89412 Acquired absence of left great toe: Secondary | ICD-10-CM | POA: Diagnosis not present

## 2014-02-26 DIAGNOSIS — Z4781 Encounter for orthopedic aftercare following surgical amputation: Secondary | ICD-10-CM | POA: Diagnosis not present

## 2014-02-26 LAB — CULTURE, ROUTINE-ABSCESS: Culture: NO GROWTH

## 2014-02-26 LAB — TISSUE CULTURE: Culture: NO GROWTH

## 2014-02-27 DIAGNOSIS — Z89412 Acquired absence of left great toe: Secondary | ICD-10-CM | POA: Diagnosis not present

## 2014-02-27 DIAGNOSIS — G629 Polyneuropathy, unspecified: Secondary | ICD-10-CM | POA: Diagnosis not present

## 2014-02-27 DIAGNOSIS — Z4781 Encounter for orthopedic aftercare following surgical amputation: Secondary | ICD-10-CM | POA: Diagnosis not present

## 2014-02-27 DIAGNOSIS — Z452 Encounter for adjustment and management of vascular access device: Secondary | ICD-10-CM | POA: Diagnosis not present

## 2014-02-27 DIAGNOSIS — Z792 Long term (current) use of antibiotics: Secondary | ICD-10-CM | POA: Diagnosis not present

## 2014-02-27 LAB — ANAEROBIC CULTURE

## 2014-02-28 DIAGNOSIS — Z89412 Acquired absence of left great toe: Secondary | ICD-10-CM | POA: Diagnosis not present

## 2014-02-28 DIAGNOSIS — Z452 Encounter for adjustment and management of vascular access device: Secondary | ICD-10-CM | POA: Diagnosis not present

## 2014-02-28 DIAGNOSIS — Z1621 Resistance to vancomycin: Secondary | ICD-10-CM | POA: Diagnosis not present

## 2014-02-28 DIAGNOSIS — G629 Polyneuropathy, unspecified: Secondary | ICD-10-CM | POA: Diagnosis not present

## 2014-02-28 DIAGNOSIS — Z4781 Encounter for orthopedic aftercare following surgical amputation: Secondary | ICD-10-CM | POA: Diagnosis not present

## 2014-02-28 DIAGNOSIS — Z792 Long term (current) use of antibiotics: Secondary | ICD-10-CM | POA: Diagnosis not present

## 2014-03-02 DIAGNOSIS — G629 Polyneuropathy, unspecified: Secondary | ICD-10-CM | POA: Diagnosis not present

## 2014-03-02 DIAGNOSIS — Z89412 Acquired absence of left great toe: Secondary | ICD-10-CM | POA: Diagnosis not present

## 2014-03-02 DIAGNOSIS — Z4781 Encounter for orthopedic aftercare following surgical amputation: Secondary | ICD-10-CM | POA: Diagnosis not present

## 2014-03-02 DIAGNOSIS — Z792 Long term (current) use of antibiotics: Secondary | ICD-10-CM | POA: Diagnosis not present

## 2014-03-02 DIAGNOSIS — Z452 Encounter for adjustment and management of vascular access device: Secondary | ICD-10-CM | POA: Diagnosis not present

## 2014-03-07 DIAGNOSIS — Z4781 Encounter for orthopedic aftercare following surgical amputation: Secondary | ICD-10-CM | POA: Diagnosis not present

## 2014-03-07 DIAGNOSIS — Z89412 Acquired absence of left great toe: Secondary | ICD-10-CM | POA: Diagnosis not present

## 2014-03-07 DIAGNOSIS — G629 Polyneuropathy, unspecified: Secondary | ICD-10-CM | POA: Diagnosis not present

## 2014-03-07 DIAGNOSIS — Z792 Long term (current) use of antibiotics: Secondary | ICD-10-CM | POA: Diagnosis not present

## 2014-03-07 DIAGNOSIS — Z452 Encounter for adjustment and management of vascular access device: Secondary | ICD-10-CM | POA: Diagnosis not present

## 2014-03-09 ENCOUNTER — Other Ambulatory Visit: Payer: Medicare Other

## 2014-03-09 DIAGNOSIS — L03116 Cellulitis of left lower limb: Secondary | ICD-10-CM | POA: Diagnosis not present

## 2014-03-09 LAB — BASIC METABOLIC PANEL
BUN: 12 mg/dL (ref 6–23)
CO2: 29 mEq/L (ref 19–32)
Calcium: 9.3 mg/dL (ref 8.4–10.5)
Chloride: 107 mEq/L (ref 96–112)
Creat: 0.86 mg/dL (ref 0.50–1.35)
Glucose, Bld: 85 mg/dL (ref 70–99)
Potassium: 4 mEq/L (ref 3.5–5.3)
Sodium: 142 mEq/L (ref 135–145)

## 2014-03-09 LAB — VANCOMYCIN, TROUGH: Vancomycin Tr: 13.7 ug/mL (ref 10.0–20.0)

## 2014-03-14 ENCOUNTER — Other Ambulatory Visit: Payer: Self-pay | Admitting: *Deleted

## 2014-03-14 ENCOUNTER — Other Ambulatory Visit: Payer: Medicare Other

## 2014-03-14 DIAGNOSIS — Z452 Encounter for adjustment and management of vascular access device: Secondary | ICD-10-CM | POA: Diagnosis not present

## 2014-03-14 DIAGNOSIS — Z792 Long term (current) use of antibiotics: Secondary | ICD-10-CM | POA: Diagnosis not present

## 2014-03-14 DIAGNOSIS — L03116 Cellulitis of left lower limb: Secondary | ICD-10-CM | POA: Diagnosis not present

## 2014-03-14 DIAGNOSIS — Z89412 Acquired absence of left great toe: Secondary | ICD-10-CM | POA: Diagnosis not present

## 2014-03-14 DIAGNOSIS — Z4781 Encounter for orthopedic aftercare following surgical amputation: Secondary | ICD-10-CM | POA: Diagnosis not present

## 2014-03-14 DIAGNOSIS — G629 Polyneuropathy, unspecified: Secondary | ICD-10-CM | POA: Diagnosis not present

## 2014-03-15 LAB — BASIC METABOLIC PANEL
BUN: 11 mg/dL (ref 6–23)
CO2: 29 mEq/L (ref 19–32)
Calcium: 9.4 mg/dL (ref 8.4–10.5)
Chloride: 104 mEq/L (ref 96–112)
Creat: 0.99 mg/dL (ref 0.50–1.35)
Glucose, Bld: 87 mg/dL (ref 70–99)
Potassium: 5 mEq/L (ref 3.5–5.3)
Sodium: 139 mEq/L (ref 135–145)

## 2014-03-15 LAB — VANCOMYCIN, TROUGH: Vancomycin Tr: 14.1 ug/mL (ref 10.0–20.0)

## 2014-03-17 ENCOUNTER — Ambulatory Visit (INDEPENDENT_AMBULATORY_CARE_PROVIDER_SITE_OTHER): Payer: Medicare Other | Admitting: Internal Medicine

## 2014-03-17 ENCOUNTER — Telehealth: Payer: Self-pay | Admitting: *Deleted

## 2014-03-17 ENCOUNTER — Encounter: Payer: Self-pay | Admitting: Internal Medicine

## 2014-03-17 VITALS — BP 127/72 | HR 76 | Temp 98.0°F | Wt 175.0 lb

## 2014-03-17 DIAGNOSIS — M869 Osteomyelitis, unspecified: Secondary | ICD-10-CM

## 2014-03-17 DIAGNOSIS — E1169 Type 2 diabetes mellitus with other specified complication: Secondary | ICD-10-CM | POA: Diagnosis not present

## 2014-03-17 DIAGNOSIS — M9080 Osteopathy in diseases classified elsewhere, unspecified site: Secondary | ICD-10-CM | POA: Diagnosis not present

## 2014-03-17 LAB — BASIC METABOLIC PANEL WITH GFR
BUN: 12 mg/dL (ref 6–23)
CO2: 26 mEq/L (ref 19–32)
Calcium: 9.1 mg/dL (ref 8.4–10.5)
Chloride: 108 mEq/L (ref 96–112)
Creat: 1.04 mg/dL (ref 0.50–1.35)
GFR, Est African American: 72 mL/min
GFR, Est Non African American: 62 mL/min
Glucose, Bld: 84 mg/dL (ref 70–99)
Potassium: 4.8 mEq/L (ref 3.5–5.3)
Sodium: 142 mEq/L (ref 135–145)

## 2014-03-17 LAB — C-REACTIVE PROTEIN: CRP: <0.5 mg/dL (ref <0.60)

## 2014-03-17 NOTE — Telephone Encounter (Signed)
Christelle, RN [facility] called to confirm end of treatment today.  Patient's PICC pulled at office visit. Andree CossHowell, Kerisha Goughnour M, RN

## 2014-03-17 NOTE — Progress Notes (Signed)
Subjective:    Patient ID: Cory Hansen, male    DOB: 05/04/1920, 80 y.o.   MRN: [id number]  HPI Cory AngelicaLee L Bina is a 80 y.o. male with spinal stenosis c/b neuropathy who sustained a left foot injury by stepping on a nail that punctured his foot on 10/31. He was treated with antibiotics blut slow to respond. He did have MRI on 02/16/14 that showed evidence of 1st MTP joint sepitc arthritis and 1st MTH osteomyelitis, as well as abscess over the dorsum of 1st MTP to intermetatarsal space. He was seen by Dr. Victorino DikeHewitt who admitted on 11/17 for I x D and underwent left 1st ray amputation. He was discharged on 2 wks of vancomycin, which he finished vanco today. Doing well with healing sutures still in place. No drainage from wound. No erythema. 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  Current Outpatient Prescriptions on File Prior to Visit  Medication Sig Dispense Refill  . glucosamine-chondroitin 500-400 MG tablet Take 1 tablet by mouth 3 (three) times daily.    Marland Kitchen. HYDROcodone-acetaminophen (NORCO/VICODIN) 5-325 MG per tablet Take 1 tablet by mouth every 6 (six) hours as needed for moderate pain. 10 tablet 0   No current facility-administered medications on file prior to visit.   No Known Allergies Active Ambulatory Problems    Diagnosis Date Noted  . HYPERLIPIDEMIA 01/16/2009  . ALLERGIC RHINITIS 01/16/2009  . INGUINAL HERNIA 01/23/2009  . ARTHRITIS 01/16/2009  . DEGENERATIVE DISC DISEASE, LUMBAR SPINE 01/23/2009  . SPINAL STENOSIS, LUMBAR 01/23/2009  . BACK PAIN, LUMBAR 01/17/2009  . FATIGUE 01/16/2009  . URINARY HESITANCY 01/16/2009  . Nonspecific (abnormal) findings on radiological and other examination of body structure 01/23/2009  . NEOPLASM, MALIGNANT, BLADDER, HX OF 01/17/2009  . RENAL CALCULUS, HX OF 01/16/2009  . NEOPLASM, BENIGN, PROSTATE, HX OF 01/17/2009  . CT, CHEST, ABNORMAL 01/23/2009  . Cellulitis of leg, left 02/11/2014  . Cellulitis of foot,  left 02/11/2014  . Abscess of left foot 02/22/2014   Resolved Ambulatory Problems    Diagnosis Date Noted  . No Resolved Ambulatory Problems   Past Medical History  Diagnosis Date  . Arthritis   . Kidney stones   . Shingles       Review of Systems     Objective:   Physical Exam  BP 127/72 mmHg  Pulse 76  Temp(Src) 98 F (36.7 C) (Oral)  Wt 175 lb (79.379 kg) Physical Exam  Constitutional: He is oriented to person, place, and time. He appears well-developed and well-nourished. No distress.  Skin: left foot incision site healing well. No fluctuance. Erythema dorsum of foot but blanching, appears to be from wearing shoe since it was not erythematous in the morning.  Labs: Lab Results  Component Value Date   ESRSEDRATE 11 03/17/2014   Lab Results  Component Value Date   CRP <0.5 03/17/2014   BMET    Component Value Date/Time   NA 142 03/17/2014 1343   K 4.8 03/17/2014 1343   CL 108 03/17/2014 1343   CO2 26 03/17/2014 1343   GLUCOSE 84 03/17/2014 1343   BUN 12 03/17/2014 1343   CREATININE 1.04 03/17/2014 1343   CREATININE 0.93 02/24/2014 0420   CALCIUM 9.1 03/17/2014 1343   GFRNONAA 62 03/17/2014 1343   GFRNONAA 72* 02/24/2014 0420   GFRAA 72 03/17/2014 1343   GFRAA 83* 02/24/2014 0420        Assessment & Plan:  Osteomyelitis with abscess s/p I  x D with amputation placed on 2 wk of antibiotics post surgery.  - will check sed rate and crp and bmp. If normalized, will not need further antibiotics - pull picc line  Addendum: no further antibiotics are needed

## 2014-03-17 NOTE — Progress Notes (Signed)
RN received verbal order to discontinue the patient's PICC line.  Patient identified with name and date of birth. PICC dressing removed, site unremarkable.    PICC line removed using sterile procedure @ 1215. PICC length equal to that noted in patient's hospital chart of 41 cm. Sterile petroleum gauze + sterile 4X4 applied to PICC site, pressure applied for 10 minutes and covered with Medipore tape as a pressure dressing. Patient tolerated procedure without complaints.  Patient instructed to limit use of arm for 1 hour. Patient instructed that the pressure dressing should remain in place for 24 hours. Patient verbalized understanding of these instructions.

## 2014-03-18 ENCOUNTER — Telehealth: Payer: Self-pay | Admitting: *Deleted

## 2014-03-18 LAB — SEDIMENTATION RATE: Sed Rate: 11 mm/hr (ref 0–16)

## 2014-03-18 NOTE — Telephone Encounter (Signed)
Per Dr Drue SecondSnider called the patient to advise him that his labs are normal and he does not need to take any further antibiotics.

## 2014-03-21 DIAGNOSIS — Z89412 Acquired absence of left great toe: Secondary | ICD-10-CM | POA: Diagnosis not present

## 2014-03-21 DIAGNOSIS — Z792 Long term (current) use of antibiotics: Secondary | ICD-10-CM | POA: Diagnosis not present

## 2014-03-21 DIAGNOSIS — Z4781 Encounter for orthopedic aftercare following surgical amputation: Secondary | ICD-10-CM | POA: Diagnosis not present

## 2014-03-21 DIAGNOSIS — G629 Polyneuropathy, unspecified: Secondary | ICD-10-CM | POA: Diagnosis not present

## 2014-03-21 DIAGNOSIS — Z452 Encounter for adjustment and management of vascular access device: Secondary | ICD-10-CM | POA: Diagnosis not present

## 2014-03-27 DIAGNOSIS — Z452 Encounter for adjustment and management of vascular access device: Secondary | ICD-10-CM | POA: Diagnosis not present

## 2014-03-27 DIAGNOSIS — Z89412 Acquired absence of left great toe: Secondary | ICD-10-CM | POA: Diagnosis not present

## 2014-03-27 DIAGNOSIS — Z4781 Encounter for orthopedic aftercare following surgical amputation: Secondary | ICD-10-CM | POA: Diagnosis not present

## 2014-03-27 DIAGNOSIS — G629 Polyneuropathy, unspecified: Secondary | ICD-10-CM | POA: Diagnosis not present

## 2014-03-28 DIAGNOSIS — Z792 Long term (current) use of antibiotics: Secondary | ICD-10-CM | POA: Diagnosis not present

## 2014-03-28 DIAGNOSIS — Z4781 Encounter for orthopedic aftercare following surgical amputation: Secondary | ICD-10-CM | POA: Diagnosis not present

## 2014-03-28 DIAGNOSIS — G629 Polyneuropathy, unspecified: Secondary | ICD-10-CM | POA: Diagnosis not present

## 2014-03-28 DIAGNOSIS — Z89412 Acquired absence of left great toe: Secondary | ICD-10-CM | POA: Diagnosis not present

## 2014-03-28 DIAGNOSIS — Z452 Encounter for adjustment and management of vascular access device: Secondary | ICD-10-CM | POA: Diagnosis not present

## 2014-10-03 ENCOUNTER — Other Ambulatory Visit: Payer: Self-pay

## 2014-10-13 ENCOUNTER — Ambulatory Visit (INDEPENDENT_AMBULATORY_CARE_PROVIDER_SITE_OTHER): Payer: Medicare Other | Admitting: Internal Medicine

## 2014-10-13 ENCOUNTER — Other Ambulatory Visit (INDEPENDENT_AMBULATORY_CARE_PROVIDER_SITE_OTHER): Payer: Medicare Other

## 2014-10-13 ENCOUNTER — Encounter: Payer: Self-pay | Admitting: Internal Medicine

## 2014-10-13 VITALS — BP 130/60 | HR 72 | Temp 97.9°F | Ht 70.0 in | Wt 184.0 lb

## 2014-10-13 DIAGNOSIS — Z0000 Encounter for general adult medical examination without abnormal findings: Secondary | ICD-10-CM | POA: Diagnosis not present

## 2014-10-13 DIAGNOSIS — Z8546 Personal history of malignant neoplasm of prostate: Secondary | ICD-10-CM | POA: Diagnosis not present

## 2014-10-13 DIAGNOSIS — D531 Other megaloblastic anemias, not elsewhere classified: Secondary | ICD-10-CM | POA: Diagnosis not present

## 2014-10-13 DIAGNOSIS — E789 Disorder of lipoprotein metabolism, unspecified: Secondary | ICD-10-CM | POA: Diagnosis not present

## 2014-10-13 DIAGNOSIS — R7301 Impaired fasting glucose: Secondary | ICD-10-CM

## 2014-10-13 DIAGNOSIS — Z23 Encounter for immunization: Secondary | ICD-10-CM

## 2014-10-13 DIAGNOSIS — R5383 Other fatigue: Secondary | ICD-10-CM | POA: Diagnosis not present

## 2014-10-13 LAB — CBC
HCT: 40.4 % (ref 39.0–52.0)
Hemoglobin: 13.2 g/dL (ref 13.0–17.0)
MCHC: 32.6 g/dL (ref 30.0–36.0)
MCV: 91.6 fl (ref 78.0–100.0)
Platelets: 209 10*3/uL (ref 150.0–400.0)
RBC: 4.41 Mil/uL (ref 4.22–5.81)
RDW: 14.4 % (ref 11.5–15.5)
WBC: 6.6 10*3/uL (ref 4.0–10.5)

## 2014-10-13 LAB — COMPREHENSIVE METABOLIC PANEL
ALT: 11 U/L (ref 0–53)
AST: 14 U/L (ref 0–37)
Albumin: 4.1 g/dL (ref 3.5–5.2)
Alkaline Phosphatase: 76 U/L (ref 39–117)
BUN: 16 mg/dL (ref 6–23)
CO2: 26 mEq/L (ref 19–32)
Calcium: 9.5 mg/dL (ref 8.4–10.5)
Chloride: 108 mEq/L (ref 96–112)
Creatinine, Ser: 1.13 mg/dL (ref 0.40–1.50)
GFR: 64.56 mL/min (ref >60.00)
Glucose, Bld: 90 mg/dL (ref 70–99)
Potassium: 4.2 mEq/L (ref 3.5–5.1)
Sodium: 141 mEq/L (ref 135–145)
Total Bilirubin: 0.5 mg/dL (ref 0.2–1.2)
Total Protein: 6.7 g/dL (ref 6.0–8.3)

## 2014-10-13 LAB — HEMOGLOBIN A1C: Hgb A1c MFr Bld: 5.5 % (ref 4.6–6.5)

## 2014-10-13 LAB — VITAMIN B12: Vitamin B-12: 117 pg/mL — ABNORMAL LOW (ref 211–911)

## 2014-10-13 LAB — LIPID PANEL
Cholesterol: 221 mg/dL — ABNORMAL HIGH (ref 0–200)
HDL: 43 mg/dL (ref >39.00)
LDL Cholesterol: 161 mg/dL — ABNORMAL HIGH (ref 0–99)
NonHDL: 178
Total CHOL/HDL Ratio: 5
Triglycerides: 84 mg/dL (ref 0.0–149.0)
VLDL: 16.8 mg/dL (ref 0.0–40.0)

## 2014-10-13 LAB — PSA: PSA: 0.56 ng/mL (ref 0.10–4.00)

## 2014-10-13 LAB — TSH: TSH: 1.51 u[IU]/mL (ref 0.35–4.50)

## 2014-10-13 NOTE — Progress Notes (Signed)
   Subjective:    Patient ID: Cory Hansen, male    DOB: June 25, 1920, 81 y.o.   MRN: [id number]  HPI The patient is a 81 YO man who is coming in as a new patient. No significant PMH. Does have complaint of fatigue which has been worsening with age. Able to do all his ADLs and whatever he wants but needs more rests. Does not have any weight loss or gain. No SOB with activity. No chest pains or cold or heat intolerance. No diarrhea or constipation. This is a new problem and gradually worsening with time.  Diet: heart healthy Physical activity: active Depression/mood screen: negative Hearing: intact to whispered voice Visual acuity: impaired with correction lens, overdue for annual eye exam  ADLs: capable Fall risk: none Home safety: good Cognitive evaluation: intact to orientation, naming, recall and repetition EOL planning: adv directives discussed  I have personally reviewed and have noted 1. The patient's medical and social history - reviewed today no changes 2. Their use of alcohol, tobacco or illicit drugs 3. Their current medications and supplements 4. The patient's functional ability including ADL's, fall risks, home safety risks and hearing or visual impairment. 5. Diet and physical activities 6. Evidence for depression or mood disorders 7. Care team reviewed and updated (available in snapshot)  Review of Systems  Constitutional: Positive for fatigue. Negative for fever, chills, activity change, appetite change and unexpected weight change.  HENT: Negative.   Eyes: Negative.   Respiratory: Negative for cough, chest tightness, shortness of breath and wheezing.   Cardiovascular: Negative for chest pain, palpitations and leg swelling.  Gastrointestinal: Negative for nausea, abdominal pain, diarrhea and constipation.  Musculoskeletal: Negative.   Skin: Negative.   Neurological: Negative.   Psychiatric/Behavioral: Negative.       Objective:   Physical Exam  Constitutional:  He is oriented to person, place, and time. He appears well-developed and well-nourished.  Appears younger than stated age  HENT:  Head: Normocephalic and atraumatic.  Right ear with wax, after lavage TM normal.   Eyes: EOM are normal.  Neck: Normal range of motion. No JVD present.  Cardiovascular: Normal rate and regular rhythm.   Pulmonary/Chest: Effort normal and breath sounds normal.  Abdominal: Soft. Bowel sounds are normal. He exhibits no distension. There is no tenderness. There is no rebound.  Musculoskeletal: He exhibits no edema.  Lymphadenopathy:    He has no cervical adenopathy.  Neurological: He is alert and oriented to person, place, and time. Coordination normal.  Skin: Skin is warm and dry.  Psychiatric: He has a normal mood and affect.   Filed Vitals:   10/13/14 1403  BP: 130/60  Pulse: 72  Temp: 97.9 F (36.6 C)  TempSrc: Oral  Height: 5\' 10"  (1.778 m)  Weight: 184 lb (83.462 kg)  SpO2: 94%      Assessment & Plan:  Prevnar 13 given at visit.

## 2014-10-13 NOTE — Progress Notes (Signed)
Pre visit review using our clinic review tool, if applicable. No additional management support is needed unless otherwise documented below in the visit note. 

## 2014-10-13 NOTE — Assessment & Plan Note (Addendum)
Checking CMP, B12, TSH, CBC, lipid panel, HgA1c. Could be normal sequelae of aging but ruling out alternatives and treat as appropriate.

## 2014-10-13 NOTE — Assessment & Plan Note (Signed)
Checking PSA as unclear history and he did not know.

## 2014-10-13 NOTE — Patient Instructions (Signed)
We have cleaned out your ear today and will check the blood work.   We will call you back even if all the blood work is normal.   You can come back in 1-2 years for a check up. If you have any new problems or questions please feel free to call the office.   Fall Prevention and Home Safety Falls cause injuries and can affect all age groups. It is possible to use preventive measures to significantly decrease the likelihood of falls. There are many simple measures which can make your home safer and prevent falls. OUTDOORS  Repair cracks and edges of walkways and driveways.  Remove high doorway thresholds.  Trim shrubbery on the main path into your home.  Have good outside lighting.  Clear walkways of tools, rocks, debris, and clutter.  Check that handrails are not broken and are securely fastened. Both sides of steps should have handrails.  Have leaves, snow, and ice cleared regularly.  Use sand or salt on walkways during winter months.  In the garage, clean up grease or oil spills. BATHROOM  Install night lights.  Install grab bars by the toilet and in the tub and shower.  Use non-skid mats or decals in the tub or shower.  Place a plastic non-slip stool in the shower to sit on, if needed.  Keep floors dry and clean up all water on the floor immediately.  Remove soap buildup in the tub or shower on a regular basis.  Secure bath mats with non-slip, double-sided rug tape.  Remove throw rugs and tripping hazards from the floors. BEDROOMS  Install night lights.  Make sure a bedside light is easy to reach.  Do not use oversized bedding.  Keep a telephone by your bedside.  Have a firm chair with side arms to use for getting dressed.  Remove throw rugs and tripping hazards from the floor. KITCHEN  Keep handles on pots and pans turned toward the center of the stove. Use back burners when possible.  Clean up spills quickly and allow time for drying.  Avoid walking  on wet floors.  Avoid hot utensils and knives.  Position shelves so they are not too high or low.  Place commonly used objects within easy reach.  If necessary, use a sturdy step stool with a grab bar when reaching.  Keep electrical cables out of the way.  Do not use floor polish or wax that makes floors slippery. If you must use wax, use non-skid floor wax.  Remove throw rugs and tripping hazards from the floor. STAIRWAYS  Never leave objects on stairs.  Place handrails on both sides of stairways and use them. Fix any loose handrails. Make sure handrails on both sides of the stairways are as long as the stairs.  Check carpeting to make sure it is firmly attached along stairs. Make repairs to worn or loose carpet promptly.  Avoid placing throw rugs at the top or bottom of stairways, or properly secure the rug with carpet tape to prevent slippage. Get rid of throw rugs, if possible.  Have an electrician put in a light switch at the top and bottom of the stairs. OTHER FALL PREVENTION TIPS  Wear low-heel or rubber-soled shoes that are supportive and fit well. Wear closed toe shoes.  When using a stepladder, make sure it is fully opened and both spreaders are firmly locked. Do not climb a closed stepladder.  Add color or contrast paint or tape to grab bars and handrails in  your home. Place contrasting color strips on first and last steps.  Learn and use mobility aids as needed. Install an electrical emergency response system.  Turn on lights to avoid dark areas. Replace light bulbs that burn out immediately. Get light switches that glow.  Arrange furniture to create clear pathways. Keep furniture in the same place.  Firmly attach carpet with non-skid or double-sided tape.  Eliminate uneven floor surfaces.  Select a carpet pattern that does not visually hide the edge of steps.  Be aware of all pets. OTHER HOME SAFETY TIPS  Set the water temperature for 120 F (48.8  C).  Keep emergency numbers on or near the telephone.  Keep smoke detectors on every level of the home and near sleeping areas. Document Released: 03/15/2002 Document Revised: 09/24/2011 Document Reviewed: 06/14/2011 [facility] Patient Information 2015 [place], [place]. This information is not intended to replace advice given to you by your health care provider. Make sure you discuss any questions you have with your health care provider.

## 2014-10-14 ENCOUNTER — Other Ambulatory Visit: Payer: Self-pay | Admitting: Internal Medicine

## 2014-10-14 MED ORDER — VITAMIN B-12 1000 MCG PO TABS: 1000.0000 ug | ORAL_TABLET | Freq: Every day | ORAL | Status: DC

## 2014-11-15 DIAGNOSIS — H3531 Nonexudative age-related macular degeneration: Secondary | ICD-10-CM | POA: Diagnosis not present

## 2014-11-15 DIAGNOSIS — H524 Presbyopia: Secondary | ICD-10-CM | POA: Diagnosis not present

## 2015-07-26 ENCOUNTER — Ambulatory Visit (INDEPENDENT_AMBULATORY_CARE_PROVIDER_SITE_OTHER): Payer: Medicare Other | Admitting: Internal Medicine

## 2015-07-26 ENCOUNTER — Encounter: Payer: Self-pay | Admitting: Internal Medicine

## 2015-07-26 ENCOUNTER — Other Ambulatory Visit (INDEPENDENT_AMBULATORY_CARE_PROVIDER_SITE_OTHER): Payer: Medicare Other

## 2015-07-26 VITALS — BP 130/70 | HR 76 | Resp 20 | Ht 71.0 in | Wt 181.0 lb

## 2015-07-26 DIAGNOSIS — S30871A Other superficial bite of abdominal wall, initial encounter: Secondary | ICD-10-CM

## 2015-07-26 DIAGNOSIS — E785 Hyperlipidemia, unspecified: Secondary | ICD-10-CM

## 2015-07-26 DIAGNOSIS — N3943 Post-void dribbling: Secondary | ICD-10-CM

## 2015-07-26 DIAGNOSIS — R5383 Other fatigue: Secondary | ICD-10-CM | POA: Diagnosis not present

## 2015-07-26 DIAGNOSIS — S30861A Insect bite (nonvenomous) of abdominal wall, initial encounter: Secondary | ICD-10-CM

## 2015-07-26 DIAGNOSIS — W57XXXA Bitten or stung by nonvenomous insect and other nonvenomous arthropods, initial encounter: Secondary | ICD-10-CM | POA: Insufficient documentation

## 2015-07-26 DIAGNOSIS — Z0000 Encounter for general adult medical examination without abnormal findings: Secondary | ICD-10-CM | POA: Diagnosis not present

## 2015-07-26 LAB — BASIC METABOLIC PANEL
BUN: 15 mg/dL (ref 6–23)
CO2: 27 mEq/L (ref 19–32)
Calcium: 9.9 mg/dL (ref 8.4–10.5)
Chloride: 105 mEq/L (ref 96–112)
Creatinine, Ser: 0.95 mg/dL (ref 0.40–1.50)
GFR: 78.74 mL/min (ref >60.00)
Glucose, Bld: 87 mg/dL (ref 70–99)
Potassium: 4.5 mEq/L (ref 3.5–5.1)
Sodium: 140 mEq/L (ref 135–145)

## 2015-07-26 LAB — CBC WITH DIFFERENTIAL/PLATELET
Basophils Absolute: 0 10*3/uL (ref 0.0–0.1)
Basophils Relative: 0.4 % (ref 0.0–3.0)
Eosinophils Absolute: 0.3 10*3/uL (ref 0.0–0.7)
Eosinophils Relative: 2.7 % (ref 0.0–5.0)
HCT: 42.5 % (ref 39.0–52.0)
Hemoglobin: 13.7 g/dL (ref 13.0–17.0)
Lymphocytes Relative: 20.8 % (ref 12.0–46.0)
Lymphs Abs: 2 10*3/uL (ref 0.7–4.0)
MCHC: 32.3 g/dL (ref 30.0–36.0)
MCV: 89.2 fl (ref 78.0–100.0)
Monocytes Absolute: 0.7 10*3/uL (ref 0.1–1.0)
Monocytes Relative: 7.5 % (ref 3.0–12.0)
Neutro Abs: 6.6 10*3/uL (ref 1.4–7.7)
Neutrophils Relative %: 68.6 % (ref 43.0–77.0)
Platelets: 214 10*3/uL (ref 150.0–400.0)
RBC: 4.76 Mil/uL (ref 4.22–5.81)
RDW: 14.3 % (ref 11.5–15.5)
WBC: 9.7 10*3/uL (ref 4.0–10.5)

## 2015-07-26 LAB — HEPATIC FUNCTION PANEL
ALT: 10 U/L (ref 0–53)
AST: 15 U/L (ref 0–37)
Albumin: 4.5 g/dL (ref 3.5–5.2)
Alkaline Phosphatase: 80 U/L (ref 39–117)
Bilirubin, Direct: 0.1 mg/dL (ref 0.0–0.3)
Total Bilirubin: 0.6 mg/dL (ref 0.2–1.2)
Total Protein: 7.6 g/dL (ref 6.0–8.3)

## 2015-07-26 LAB — URINALYSIS, ROUTINE W REFLEX MICROSCOPIC
Bilirubin Urine: NEGATIVE
Leukocytes, UA: NEGATIVE
Nitrite: NEGATIVE
Specific Gravity, Urine: 1.025 (ref 1.000–1.030)
Total Protein, Urine: NEGATIVE
Urine Glucose: NEGATIVE
Urobilinogen, UA: 0.2 (ref 0.0–1.0)
pH: 5.5 (ref 5.0–8.0)

## 2015-07-26 LAB — LIPID PANEL
Cholesterol: 232 mg/dL — ABNORMAL HIGH (ref 0–200)
HDL: 53.8 mg/dL (ref >39.00)
LDL Cholesterol: 163 mg/dL — ABNORMAL HIGH (ref 0–99)
NonHDL: 177.81
Total CHOL/HDL Ratio: 4
Triglycerides: 75 mg/dL (ref 0.0–149.0)
VLDL: 15 mg/dL (ref 0.0–40.0)

## 2015-07-26 LAB — TSH: TSH: 1.19 u[IU]/mL (ref 0.35–4.50)

## 2015-07-26 MED ORDER — DOXYCYCLINE HYCLATE 100 MG PO TABS: 100.0000 mg | ORAL_TABLET | Freq: Two times a day (BID) | ORAL | Status: DC

## 2015-07-26 NOTE — Progress Notes (Signed)
Subjective:   Cory Hansen is a 82 y.o. male who presents for Medicare Annual/Subsequent preventive examination.  Review of Systems:  HRA assessment completed during visit; Cory Hansen  The Patient was informed that this wellness visit is to identify risk and educate on how to reduce risk for increase disease through lifestyle changes.   ROS deferred to CPE exam with physician today  7 children and 5 dtr and 2 boys The girls are in [place] and close by  Lives in family home; has dog that keeps him company States his parents divorced at early age; he rebelled; has can read but cannot write; family assist.  WW[employer] veteran.   Medical and family hx deferred to MD review  Tobacco; never smoked  ETOH- no drinking hx  Medication review by Dr. Jonny RuizJohn  BMI: 25  Diet; dtr's brings food;  Gets up around 7am;  Eggs and sausage;  Eats when hungry; dtr state he always has food;  Family monitors / dtr calls every day; call  son works 3rd and checks on him  Declines helpline    Exercise; outside working in the yard; Used to fall "alot but not lately"  Wears a brace on knee; wears when outside  SAFETY; one level home; with basement;  Goes downstairs; can get to the basement from the outside; (14 steps)  Safety reviewed for the home;   Railing as needed;  Bathroom safety discussed  Buyer, retailCommunity safety; gets in tub for shower; takes a [place]   Smoke detectors yes Firearms safety / no firearms in home Driving accidents and seatbelt/ no accidents;  Sun protection/ wears a hat when outside in the sun;  Stressors 1-5;  States he has nothing to be stressed about   Depression not an issue  Fall assessment "not lately" brace to right leg; BK  Stepped on dirty nail and didn't tell anyone and got infected and lost toe approx 1.5 years ago. 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 Educated that he could get eye exam and hearing screen with hearing aids. Stated he thought the hearing aids would help him.  Ophthalmology exam; [place] patrol told him he will need eye exam prior to renewal of license;  has a little trouble with eyes;  night time driving restricted now No hx of accidents; Drives around all over town;   Volunteers; picks up bread at grocery store; takes uptown to different places who may need it;   Immunizations zostavax; had shingles approx. 82 yo;  Didn't have a lot of pain; Declines zostavax   Advanced Directive; completed; need copy for chart  Does not have LW: Educated on LW and did agree to take a copy of [place] LW home to review with the family.   Health advice or referrals  [facility] ([facility] and [facility])    Current Care Team reviewed and updated  Cardiac Risk Factors include:  advanced age (>27men, >65 women)     Objective:    Vitals: BP 130/70 mmHg  Pulse 76  Resp 20  Ht  (1.803 m)  Wt 181 lb (82.101 kg)  BMI 25.26 kg/m2  SpO2 95%  Body mass index is 25.26 kg/(m^2).  Tobacco History  Smoking status  . Never Smoker   Smokeless tobacco  . Never Used     Counseling given: Yes   Past Medical History  Diagnosis Date  . Arthritis   . Kidney stones   . Shingles    Past Surgical History  Procedure Laterality Date  . Eye surgery Bilateral     cataract surgery w/ lens implant  . Appendectomy    . Hernia repair       inguinal hernia repair  . Amputation Left 02/22/2014    Procedure: INCISION AND DRAINAGE LEFT FOREFOOT AMPUTATION FIRST RAY;  Surgeon: Toni Arthurs, MD;  Location: [facility] OR;  Service: Orthopedics;  Laterality: Left;   No family history on file. History  Sexual Activity  . Sexual Activity: Not on file    Outpatient Encounter Prescriptions as of 07/26/2015  Medication Sig  . glucosamine-chondroitin 500-400 MG tablet Take 1 tablet by mouth 3 (three) times daily.  . vitamin B-12 (CYANOCOBALAMIN) 1000 MCG tablet Take 1 tablet (1,000 mcg total) by mouth daily.  Marland Kitchen doxycycline (VIBRA-TABS) 100 MG tablet Take 1 tablet (100 mg total) by mouth 2 (two) times daily.   No facility-administered encounter medications on file as of 07/26/2015.    Activities of Daily Living In your present state of health, do you have any difficulty performing the following activities: 07/26/2015  Hearing? Y  Vision? N  Difficulty concentrating or making decisions? N  Walking or climbing stairs? N  Dressing or bathing? N  Doing errands, shopping? N  Preparing Food and eating ? N  Using the Toilet? N  In the past six months, have you accidently leaked urine? (No Data)  Do you have problems with loss of bowel control? N  Managing your Medications? N  Managing your Finances? N  Housekeeping or managing your Housekeeping? N    Patient Care Team: [name], MD as PCP - General (Internal Medicine)   Assessment:     Exercise Activities and Dietary recommendations Current Exercise Habits: Home exercise routine, Type of exercise: walking, Time (Minutes): 60, Frequency (Times/Week): 3, Weekly Exercise (Minutes/Week): 180, Intensity: Mild  Goals    . patient     Continue to get exercise and work      Fall Risk Fall Risk  07/26/2015 03/17/2014  Falls in the past year? No No   Depression Screen PHQ 2/9 Scores 07/26/2015 07/26/2015 03/17/2014  PHQ - 2 Score 0 0 0    Cognitive Testing No flowsheet data  found.  Ad8 score 0   Immunization History  Administered Date(s) Administered  . Pneumococcal Conjugate-13 10/13/2014  . Pneumococcal Polysaccharide-23 04/08/2005  . Tdap 02/08/2014   Screening Tests Health Maintenance  Topic Date Due  . FOOT EXAM  02/25/1931  . OPHTHALMOLOGY EXAM  02/25/1931  . URINE MICROALBUMIN  02/25/1931  . ZOSTAVAX  02/26/1983  . HEMOGLOBIN A1C  04/15/2015  . INFLUENZA VACCINE  11/07/2015  . TETANUS/TDAP  02/09/2024  . PNA vac Low Risk Adult  Completed      Plan:     Will have eye exam this year  Will review Advanced Directive for LW / given copy of [facility]'s LW   May need hearing screen;  referred to [facility]; given resources below    Deaf & Hard of Hearing Division Services  No reviews  [facility]  [address] [place] [address]  [phone]  ----[email or web address]  During the course of the visit the patient was educated and counseled about the following appropriate screening and preventive services:   Vaccines to include Pneumoccal, Influenza, Hepatitis B, Td, Zostavax, HCV/ declines shingles   Electrocardiogram/ deferred   Cardiovascular Disease/ no issue  Colorectal cancer screening/ aged out  Diabetes screening neg  Prostate Cancer Screening deferred   Glaucoma screening to have eye examined this year; Sept or Oct  Nutrition counseling / good   Smoking cessation counseling/ never smoked   Patient Instructions (the written plan) was given to the patient.    Montine Circle, RN  07/26/2015

## 2015-07-26 NOTE — Progress Notes (Signed)
Subjective:    Patient ID: Cory Hansen, male    DOB: [birth year], 82 y.o.   MRN: [id number]  HPI  Here to f/u with me as PCP not available, 82yo WM with family, pt quite bright and spry for age, seems to have little cognitive difficulty, but most hx given per family and confirmed by pt.  Pt with increased fatigue and sleeping much more during the day then usual in the past few weeks without obvious cause, as seems to sleep more or less similar at night has per usual.  No snoring, and  Pt denies fever, wt loss, night sweats, loss of appetite, or other constitutional symptoms Wt Readings from Last 3 Encounters:  07/26/15 181 lb (82.101 kg)  10/13/14 184 lb (83.462 kg)  03/17/14 175 lb (79.379 kg)  Does have some increased urinary incontinence evident by infreq but at least daily episode of mild to mod urgency, then pre-void dribbling.  Did have significant episode diarrhea recently, watery now resolved, no fever, and was able to keep up with fluids for the most part.  Pt denies chest pain, increased sob or doe, wheezing, orthopnea, PND, increased LE swelling, palpitations, dizziness or syncope.  Pt denies new neurological symptoms such as new headache, or facial or extremity weakness or numbness   Pt denies polydipsia, polyuria.  Also had an insect bite he believes to be a tick related to mid upper right abd x 2 wks, now healed, did not seem medical attention at the time.  No Rash, family very worried about deer tick and lyme dz.   Past Medical History  Diagnosis Date  . Arthritis   . Kidney stones   . Shingles    Past Surgical History  Procedure Laterality Date  . Eye surgery Bilateral     cataract surgery w/ lens implant  . Appendectomy    . Hernia repair      inguinal hernia repair  . Amputation Left 02/22/2014    Procedure: INCISION AND DRAINAGE LEFT FOREFOOT AMPUTATION FIRST RAY;  Surgeon: Toni Arthurs, MD;  Location: [facility] OR;  Service: Orthopedics;  Laterality: Left;    reports that  he has never smoked. He has never used smokeless tobacco. He reports that he drinks alcohol. He reports that he does not use illicit drugs. family history is not on file. No Known Allergies Current Outpatient Prescriptions on File Prior to Visit  Medication Sig Dispense Refill  . glucosamine-chondroitin 500-400 MG tablet Take 1 tablet by mouth 3 (three) times daily.    . vitamin B-12 (CYANOCOBALAMIN) 1000 MCG tablet Take 1 tablet (1,000 mcg total) by mouth daily. 30 tablet 11   No current facility-administered medications on file prior to visit.   Review of Systems  Constitutional: Negative for unusual diaphoresis or night sweats HENT: Negative for ear swelling or discharge Eyes: Negative for worsening visual haziness  Respiratory: Negative for choking and stridor.   Gastrointestinal: Negative for distension or worsening eructation Genitourinary: Negative for retention or change in urine volume.  Musculoskeletal: Negative for other MSK pain or swelling Skin: Negative for color change and worsening wound Neurological: Negative for tremors and numbness other than noted  Psychiatric/Behavioral: Negative for decreased concentration or agitation other than above       Objective:   Physical Exam BP 130/70 mmHg  Pulse 76  Resp 20  Ht  (1.803 m)  Wt 181 lb (82.101 kg)  BMI 25.26 kg/m2  SpO2 95% VS noted,  Constitutional: Pt appears in  no apparent distress HENT: Head: NCAT.  Right Ear: External ear normal.  Left Ear: External ear normal.  Eyes: . Pupils are equal, round, and reactive to light. Conjunctivae and EOM are normal Neck: Normal range of motion. Neck supple.  Cardiovascular: Normal rate and regular rhythm.   Pulmonary/Chest: Effort normal and breath sounds without rales or wheezing.  Abd:  Soft, NT, ND, + BS Neurological: Pt is alert. Not confused , motor grossly intact Skin: Skin is warm. No rash, no LE edema but does scabbed small lesion to right upper mid abd  without tender/red/swelling or drainage Psychiatric: Pt behavior is normal. No agitation.     Assessment & Plan:

## 2015-07-26 NOTE — Patient Instructions (Addendum)
Please take all new medication as prescribed - the antibiotic - sent to [facility]  Please continue all other medications as before, and refills have been done if requested.  Please have the pharmacy call with any other refills you may need.  Please continue your efforts at being more active, low cholesterol diet, and weight control.  You are otherwise up to date with prevention measures today.  Please keep your appointments with your specialists as you may have planned  Please go to the LAB in the Basement (turn left off the elevator) for the tests to be done today  You will be contacted by phone if any changes need to be made immediately.  Otherwise, you will receive a letter about your results with an explanation, but please check with MyChart first.  Please remember to sign up for MyChart if you have not done so, as this will be important to you in the future with finding out test results, communicating by private email, and scheduling acute appointments online when needed.  Please return in 1 year for your yearly visit, or sooner if needed   [name] , Thank you for taking time to come for your Medicare Wellness Visit. I appreciate your ongoing commitment to your health goals. Please review the following plan we discussed and let me know if I can assist you in the future.    These are the goals we discussed: Goals    . patient     Continue to get exercise and work       Will have eye exam this year  Will review Advanced Directive for [employer]   May need hearing screen;   Deaf & Hard of Hearing Division Services  No reviews  [employer]  [address]  [phone]  ----[email or web address]  [address] [place], [place] [address] [phone]   This is a list of the screening recommended for you and due dates:  Health Maintenance  Topic Date Due  . Complete foot exam   02/25/1933  . Eye exam for diabetics   02[birth year]25/1931  . Urine Protein Check  02/25/1933  . Shingles Vaccine  02/26/1983  . Hemoglobin A1C  04/15/2015  . Flu Shot  11/07/2015  . Tetanus Vaccine  02/09/2024  . Pneumonia vaccines  Completed

## 2015-07-26 NOTE — Progress Notes (Signed)
Pre visit review using our clinic review tool, if applicable. No additional management support is needed unless otherwise documented below in the visit note. 

## 2015-07-27 NOTE — Assessment & Plan Note (Signed)
Presumed tick, no rash or fever, cant r/o infection, for doxy course,  to f/u any worsening symptoms or concerns

## 2015-07-27 NOTE — Assessment & Plan Note (Signed)
stable overall by history and exam, recent data reviewed with pt, and pt to continue medical treatment as before,  to f/u any worsening symptoms or concerns Lab Results  Component Value Date   LDLCALC 163* 07/26/2015  pt with transporation issue, famlily asks for labs today, will f/u with PCP

## 2015-07-27 NOTE — Assessment & Plan Note (Signed)
Etiology unclear, may have been related to possible viral GI illness?  Some improved now, exam benign, ok to follow, to check labs as documented

## 2015-07-27 NOTE — Assessment & Plan Note (Addendum)
Seems likely prostate issue, cant r/o OAB, will do UA for now, pt is afeb,  Declines urology referral, to f/u any worsening symptoms or concerns

## 2015-07-28 ENCOUNTER — Other Ambulatory Visit: Payer: Self-pay | Admitting: Internal Medicine

## 2015-07-28 MED ORDER — LEVOFLOXACIN 250 MG PO TABS
250.0000 mg | ORAL_TABLET | Freq: Every day | ORAL | Status: DC
Start: 2015-07-28 — End: 2015-10-27

## 2015-07-29 LAB — URINE CULTURE: Colony Count: 50000

## 2015-09-07 DIAGNOSIS — M25561 Pain in right knee: Secondary | ICD-10-CM | POA: Diagnosis not present

## 2015-09-07 DIAGNOSIS — M1712 Unilateral primary osteoarthritis, left knee: Secondary | ICD-10-CM | POA: Diagnosis not present

## 2015-09-07 DIAGNOSIS — M1711 Unilateral primary osteoarthritis, right knee: Secondary | ICD-10-CM | POA: Diagnosis not present

## 2015-09-07 DIAGNOSIS — M25562 Pain in left knee: Secondary | ICD-10-CM | POA: Diagnosis not present

## 2015-09-14 DIAGNOSIS — M1711 Unilateral primary osteoarthritis, right knee: Secondary | ICD-10-CM | POA: Diagnosis not present

## 2015-09-14 DIAGNOSIS — M1712 Unilateral primary osteoarthritis, left knee: Secondary | ICD-10-CM | POA: Diagnosis not present

## 2015-09-21 DIAGNOSIS — M1712 Unilateral primary osteoarthritis, left knee: Secondary | ICD-10-CM | POA: Diagnosis not present

## 2015-09-21 DIAGNOSIS — M1711 Unilateral primary osteoarthritis, right knee: Secondary | ICD-10-CM | POA: Diagnosis not present

## 2015-09-28 DIAGNOSIS — M1712 Unilateral primary osteoarthritis, left knee: Secondary | ICD-10-CM | POA: Diagnosis not present

## 2015-09-28 DIAGNOSIS — M1711 Unilateral primary osteoarthritis, right knee: Secondary | ICD-10-CM | POA: Diagnosis not present

## 2015-10-05 DIAGNOSIS — M170 Bilateral primary osteoarthritis of knee: Secondary | ICD-10-CM | POA: Diagnosis not present

## 2015-10-12 DIAGNOSIS — M170 Bilateral primary osteoarthritis of knee: Secondary | ICD-10-CM | POA: Diagnosis not present

## 2015-10-18 DIAGNOSIS — M5442 Lumbago with sciatica, left side: Secondary | ICD-10-CM | POA: Diagnosis not present

## 2015-10-18 DIAGNOSIS — M5441 Lumbago with sciatica, right side: Secondary | ICD-10-CM | POA: Diagnosis not present

## 2015-10-27 ENCOUNTER — Encounter: Payer: Self-pay | Admitting: Internal Medicine

## 2015-10-27 ENCOUNTER — Other Ambulatory Visit (INDEPENDENT_AMBULATORY_CARE_PROVIDER_SITE_OTHER): Payer: Medicare Other

## 2015-10-27 ENCOUNTER — Ambulatory Visit (INDEPENDENT_AMBULATORY_CARE_PROVIDER_SITE_OTHER): Payer: Medicare Other | Admitting: Internal Medicine

## 2015-10-27 VITALS — BP 138/62 | HR 69 | Temp 97.9°F | Resp 16 | Ht 70.0 in | Wt 174.8 lb

## 2015-10-27 DIAGNOSIS — E785 Hyperlipidemia, unspecified: Secondary | ICD-10-CM

## 2015-10-27 DIAGNOSIS — R197 Diarrhea, unspecified: Secondary | ICD-10-CM | POA: Diagnosis not present

## 2015-10-27 LAB — CBC
HCT: 41.6 % (ref 39.0–52.0)
Hemoglobin: 13.5 g/dL (ref 13.0–17.0)
MCHC: 32.4 g/dL (ref 30.0–36.0)
MCV: 90.3 fl (ref 78.0–100.0)
Platelets: 252 10*3/uL (ref 150.0–400.0)
RBC: 4.61 Mil/uL (ref 4.22–5.81)
RDW: 15.7 % — ABNORMAL HIGH (ref 11.5–15.5)
WBC: 8.6 10*3/uL (ref 4.0–10.5)

## 2015-10-27 LAB — LIPID PANEL
Cholesterol: 227 mg/dL — ABNORMAL HIGH (ref 0–200)
HDL: 53.6 mg/dL (ref >39.00)
LDL Cholesterol: 153 mg/dL — ABNORMAL HIGH (ref 0–99)
NonHDL: 173.15
Total CHOL/HDL Ratio: 4
Triglycerides: 101 mg/dL (ref 0.0–149.0)
VLDL: 20.2 mg/dL (ref 0.0–40.0)

## 2015-10-27 LAB — T4, FREE: Free T4: 1.06 ng/dL (ref 0.60–1.60)

## 2015-10-27 LAB — COMPREHENSIVE METABOLIC PANEL
ALT: 11 U/L (ref 0–53)
AST: 14 U/L (ref 0–37)
Albumin: 4.4 g/dL (ref 3.5–5.2)
Alkaline Phosphatase: 72 U/L (ref 39–117)
BUN: 12 mg/dL (ref 6–23)
CO2: 29 mEq/L (ref 19–32)
Calcium: 9.7 mg/dL (ref 8.4–10.5)
Chloride: 106 mEq/L (ref 96–112)
Creatinine, Ser: 1.04 mg/dL (ref 0.40–1.50)
GFR: 70.89 mL/min (ref >60.00)
Glucose, Bld: 92 mg/dL (ref 70–99)
Potassium: 3.7 mEq/L (ref 3.5–5.1)
Sodium: 143 mEq/L (ref 135–145)
Total Bilirubin: 0.7 mg/dL (ref 0.2–1.2)
Total Protein: 7.4 g/dL (ref 6.0–8.3)

## 2015-10-27 LAB — TSH: TSH: 1.28 u[IU]/mL (ref 0.35–4.50)

## 2015-10-27 MED ORDER — DIPHENOXYLATE-ATROPINE 2.5-0.025 MG PO TABS: 1.0000 | ORAL_TABLET | Freq: Four times a day (QID) | ORAL | Status: DC | PRN

## 2015-10-27 NOTE — Patient Instructions (Signed)
We need to check the test of the stool for the bacteria and bugs we talked about.  I have given you a prescription for lomotil to help with the diarrhea which you can take up to 4 times a day to stop the diarrhea. If you are getting constipated you are taking too much so back down on how much you are taking.   I would recommend to start taking a probiotic or eating activia daily.   Food Choices to Help Relieve Diarrhea, Adult When you have diarrhea, the foods you eat and your eating habits are very important. Choosing the right foods and drinks can help relieve diarrhea. Also, because diarrhea can last up to 7 days, you need to replace lost fluids and electrolytes (such as sodium, potassium, and chloride) in order to help prevent dehydration.  WHAT GENERAL GUIDELINES DO I NEED TO FOLLOW?  Slowly drink 1 cup (8 oz) of fluid for each episode of diarrhea. If you are getting enough fluid, your urine will be clear or pale yellow.  Eat starchy foods. Some good choices include white rice, white toast, pasta, low-fiber cereal, baked potatoes (without the skin), saltine crackers, and bagels.  Avoid large servings of any cooked vegetables.  Limit fruit to two servings per day. A serving is  cup or 1 small piece.  Choose foods with less than 2 g of fiber per serving.  Limit fats to less than 8 tsp (38 g) per day.  Avoid fried foods.  Eat foods that have probiotics in them. Probiotics can be found in certain dairy products.  Avoid foods and beverages that may increase the speed at which food moves through the stomach and intestines (gastrointestinal tract). Things to avoid include:  High-fiber foods, such as dried fruit, raw fruits and vegetables, nuts, seeds, and whole grain foods.  Spicy foods and high-fat foods.  Foods and beverages sweetened with high-fructose corn syrup, honey, or sugar alcohols such as xylitol, sorbitol, and mannitol. WHAT FOODS ARE RECOMMENDED? Grains White rice.  White, [place], or pita breads (fresh or toasted), including plain rolls, buns, or bagels. White pasta. Saltine, soda, or graham crackers. Pretzels. Low-fiber cereal. Cooked cereals made with water (such as cornmeal, farina, or cream cereals). Plain muffins. Matzo. Melba toast. Zwieback.  Vegetables Potatoes (without the skin). Strained tomato and vegetable juices. Most well-cooked and canned vegetables without seeds. Tender lettuce. Fruits Cooked or canned applesauce, apricots, cherries, fruit cocktail, grapefruit, peaches, pears, or plums. Fresh bananas, apples without skin, cherries, grapes, cantaloupe, grapefruit, peaches, oranges, or plums.  Meat and Other Protein Products Baked or boiled chicken. Eggs. Tofu. Fish. Seafood. Smooth peanut butter. Ground or well-cooked tender beef, ham, veal, lamb, pork, or poultry.  Dairy Plain yogurt, kefir, and unsweetened liquid yogurt. Lactose-free milk, buttermilk, or soy milk. Plain hard cheese. Beverages Sport drinks. Clear broths. Diluted fruit juices (except prune). Regular, caffeine-free sodas such as ginger ale. Water. Decaffeinated teas. Oral rehydration solutions. Sugar-free beverages not sweetened with sugar alcohols. Other Bouillon, broth, or soups made from recommended foods.  The items listed above may not be a complete list of recommended foods or beverages. Contact your dietitian for more options. WHAT FOODS ARE NOT RECOMMENDED? Grains Whole grain, whole wheat, bran, or rye breads, rolls, pastas, crackers, and cereals. Wild or brown rice. Cereals that contain more than 2 g of fiber per serving. Corn tortillas or taco shells. Cooked or dry oatmeal. Granola. Popcorn. Vegetables Raw vegetables. Cabbage, broccoli, Brussels sprouts, artichokes, baked beans, beet greens, corn,  kale, legumes, peas, sweet potatoes, and yams. Potato skins. Cooked spinach and cabbage. Fruits Dried fruit, including raisins and dates. Raw fruits. Stewed or dried prunes.  Fresh apples with skin, apricots, mangoes, pears, raspberries, and strawberries.  Meat and Other Protein Products Chunky peanut butter. Nuts and seeds. Beans and lentils. Tomasa BlaseBacon.  Dairy High-fat cheeses. Milk, chocolate milk, and beverages made with milk, such as milk shakes. Cream. Ice cream. Sweets and Desserts Sweet rolls, doughnuts, and sweet breads. Pancakes and waffles. Fats and Oils Butter. Cream sauces. Margarine. Salad oils. Plain salad dressings. Olives. Avocados.  Beverages Caffeinated beverages (such as coffee, tea, soda, or energy drinks). Alcoholic beverages. Fruit juices with pulp. Prune juice. Soft drinks sweetened with high-fructose corn syrup or sugar alcohols. Other Coconut. Hot sauce. Chili powder. Mayonnaise. Gravy. Cream-based or milk-based soups.  The items listed above may not be a complete list of foods and beverages to avoid. Contact your dietitian for more information. WHAT SHOULD I DO IF I BECOME DEHYDRATED? Diarrhea can sometimes lead to dehydration. Signs of dehydration include dark urine and dry mouth and skin. If you think you are dehydrated, you should rehydrate with an oral rehydration solution. These solutions can be purchased at pharmacies, retail stores, or online.  Drink -1 cup (120-240 mL) of oral rehydration solution each time you have an episode of diarrhea. If drinking this amount makes your diarrhea worse, try drinking smaller amounts more often. For example, drink 1-3 tsp (5-15 mL) every 5-10 minutes.  A general rule for staying hydrated is to drink 1-2 L of fluid per day. Talk to your health care provider about the specific amount you should be drinking each day. Drink enough fluids to keep your urine clear or pale yellow.   This information is not intended to replace advice given to you by your health care provider. Make sure you discuss any questions you have with your health care provider.   Document Released: 06/15/2003 Document Revised:  04/15/2014 Document Reviewed: 02/15/2013 Elsevier Interactive Patient Education [employer]2016 Elsevier Inc.

## 2015-10-27 NOTE — Assessment & Plan Note (Signed)
Given the weight loss, checking GI pathogen panel (which includes C dif), CBC, CMP, TSH and free T4. He is down 10 pounds since last year (7 in the last 3 months). If no etiology on the labs will refer to GI. Rx for lomotil to try for the diarrhea. He has had antibiotics in the last 3 months including levaquin which is high risk for C dif. Treat as indicated.

## 2015-10-27 NOTE — Assessment & Plan Note (Signed)
Checking lipid panel. 

## 2015-10-27 NOTE — Progress Notes (Signed)
   Subjective:    Patient ID: Cory Hansen, male    DOB: 1921-02-05, 82 y.o.   MRN: [id number]  HPI The patient is a 82 YO man coming in for diarrhea. He has been having it for several months. He had it in April when he came in and it was not addressed well. He was having liquid stools only multiple times per day for months. He states in the last 4 weeks it is slightly formed but still mostly liquid. His daughter is with him and helps to provide history. He is not able to make it to the bathroom a lot of the time. 3-4 times per day still. Last visit he was given doxycycline and levaquin (for possible tick and UTI). His urinary symptoms have resolved. He has tried imodium over the counter for it but it did not help so he stopped taking it. Denies fevers or chills. Down 7 pounds in the last 3 months. He will eat foods that have been left out on the counter or in the car for hours. Denies any stream or creek water. They state recent X-ray back with some gas in the colon but no signs of blockage.   Review of Systems  Constitutional: Positive for fatigue and unexpected weight change. Negative for fever, chills, activity change and appetite change.  Respiratory: Negative for cough, chest tightness, shortness of breath and wheezing.   Cardiovascular: Negative for chest pain, palpitations and leg swelling.  Gastrointestinal: Positive for diarrhea. Negative for nausea, abdominal pain, constipation and abdominal distention.  Musculoskeletal: Negative.   Skin: Negative.   Neurological: Negative.   Psychiatric/Behavioral: Negative.       Objective:   Physical Exam  Constitutional: He is oriented to person, place, and time. He appears well-developed and well-nourished.  HENT:  Head: Normocephalic and atraumatic.  Eyes: EOM are normal.  Neck: Normal range of motion. No JVD present.  Cardiovascular: Normal rate and regular rhythm.   Pulmonary/Chest: Effort normal and breath sounds normal.  Abdominal:  Soft. Bowel sounds are normal. He exhibits no distension. There is no tenderness. There is no rebound and no guarding.  Some increase BS on exam  Musculoskeletal: He exhibits no edema.  Lymphadenopathy:    He has no cervical adenopathy.  Neurological: He is alert and oriented to person, place, and time. Coordination normal.  Skin: Skin is warm and dry.  Psychiatric: He has a normal mood and affect.   Filed Vitals:   10/27/15 1010  BP: 138/62  Pulse: 69  Temp: 97.9 F (36.6 C)  TempSrc: Oral  Resp: 16  Height: 5\' 10"  (1.778 m)  Weight: 174 lb 12.8 oz (79.289 kg)  SpO2: 97%      Assessment & Plan:

## 2015-10-27 NOTE — Progress Notes (Signed)
Pre visit review using our clinic review tool, if applicable. No additional management support is needed unless otherwise documented below in the visit note. 

## 2015-10-31 LAB — GASTROINTESTINAL PATHOGEN PANEL PCR
C. difficile Tox A/B, PCR: NOT DETECTED
Campylobacter, PCR: NOT DETECTED
Cryptosporidium, PCR: NOT DETECTED
E coli (ETEC) LT/ST PCR: NOT DETECTED
E coli (STEC) stx1/stx2, PCR: NOT DETECTED
E coli 0157, PCR: NOT DETECTED
Giardia lamblia, PCR: NOT DETECTED
Norovirus, PCR: NOT DETECTED
Rotavirus A, PCR: NOT DETECTED
Salmonella, PCR: NOT DETECTED
Shigella, PCR: NOT DETECTED

## 2015-11-02 ENCOUNTER — Other Ambulatory Visit: Payer: Self-pay | Admitting: Internal Medicine

## 2015-11-02 DIAGNOSIS — R197 Diarrhea, unspecified: Secondary | ICD-10-CM

## 2015-11-07 ENCOUNTER — Telehealth: Payer: Self-pay | Admitting: Emergency Medicine

## 2015-11-07 NOTE — Telephone Encounter (Signed)
Pts daughter called and stated she needs pts records sent over to [facility]. She also stated that she was told somebody from this office would call her back a week ago and she hasnt heard anything. Please follow up and give her a call back thanks.

## 2015-11-09 NOTE — Telephone Encounter (Signed)
Left message on voicemail for patient's daughter to call me back.

## 2015-11-12 ENCOUNTER — Encounter: Payer: Self-pay | Admitting: Internal Medicine

## 2015-12-01 DIAGNOSIS — R197 Diarrhea, unspecified: Secondary | ICD-10-CM | POA: Diagnosis not present

## 2015-12-04 DIAGNOSIS — R197 Diarrhea, unspecified: Secondary | ICD-10-CM | POA: Diagnosis not present

## 2015-12-19 DIAGNOSIS — H3530 Unspecified macular degeneration: Secondary | ICD-10-CM | POA: Diagnosis not present

## 2015-12-19 DIAGNOSIS — Z961 Presence of intraocular lens: Secondary | ICD-10-CM | POA: Diagnosis not present

## 2015-12-19 DIAGNOSIS — H31011 Macula scars of posterior pole (postinflammatory) (post-traumatic), right eye: Secondary | ICD-10-CM | POA: Diagnosis not present

## 2016-01-09 ENCOUNTER — Encounter: Payer: Self-pay | Admitting: Student

## 2017-03-11 DIAGNOSIS — Z961 Presence of intraocular lens: Secondary | ICD-10-CM | POA: Diagnosis not present

## 2017-03-11 DIAGNOSIS — H524 Presbyopia: Secondary | ICD-10-CM | POA: Diagnosis not present

## 2017-03-11 DIAGNOSIS — H31011 Macula scars of posterior pole (postinflammatory) (post-traumatic), right eye: Secondary | ICD-10-CM | POA: Diagnosis not present

## 2017-03-11 DIAGNOSIS — H52202 Unspecified astigmatism, left eye: Secondary | ICD-10-CM | POA: Diagnosis not present

## 2017-03-11 DIAGNOSIS — H353133 Nonexudative age-related macular degeneration, bilateral, advanced atrophic without subfoveal involvement: Secondary | ICD-10-CM | POA: Diagnosis not present

## 2017-03-11 DIAGNOSIS — H5202 Hypermetropia, left eye: Secondary | ICD-10-CM | POA: Diagnosis not present

## 2017-10-31 ENCOUNTER — Other Ambulatory Visit (INDEPENDENT_AMBULATORY_CARE_PROVIDER_SITE_OTHER): Payer: Medicare Other

## 2017-10-31 ENCOUNTER — Encounter: Payer: Self-pay | Admitting: Internal Medicine

## 2017-10-31 ENCOUNTER — Ambulatory Visit (INDEPENDENT_AMBULATORY_CARE_PROVIDER_SITE_OTHER): Payer: Medicare Other | Admitting: Internal Medicine

## 2017-10-31 VITALS — BP 110/70 | HR 66 | Temp 97.6°F | Ht 70.0 in | Wt 181.0 lb

## 2017-10-31 DIAGNOSIS — E559 Vitamin D deficiency, unspecified: Secondary | ICD-10-CM

## 2017-10-31 DIAGNOSIS — Z0000 Encounter for general adult medical examination without abnormal findings: Secondary | ICD-10-CM | POA: Diagnosis not present

## 2017-10-31 DIAGNOSIS — E785 Hyperlipidemia, unspecified: Secondary | ICD-10-CM

## 2017-10-31 DIAGNOSIS — R197 Diarrhea, unspecified: Secondary | ICD-10-CM | POA: Diagnosis not present

## 2017-10-31 DIAGNOSIS — E538 Deficiency of other specified B group vitamins: Secondary | ICD-10-CM | POA: Diagnosis not present

## 2017-10-31 DIAGNOSIS — N3943 Post-void dribbling: Secondary | ICD-10-CM

## 2017-10-31 DIAGNOSIS — R5383 Other fatigue: Secondary | ICD-10-CM | POA: Diagnosis not present

## 2017-10-31 LAB — COMPREHENSIVE METABOLIC PANEL WITH GFR
ALT: 11 U/L (ref 0–53)
AST: 15 U/L (ref 0–37)
Albumin: 4.4 g/dL (ref 3.5–5.2)
Alkaline Phosphatase: 71 U/L (ref 39–117)
BUN: 19 mg/dL (ref 6–23)
CO2: 27 meq/L (ref 19–32)
Calcium: 9.5 mg/dL (ref 8.4–10.5)
Chloride: 106 meq/L (ref 96–112)
Creatinine, Ser: 1.04 mg/dL (ref 0.40–1.50)
GFR: 70.58 mL/min
Glucose, Bld: 96 mg/dL (ref 70–99)
Potassium: 4.1 meq/L (ref 3.5–5.1)
Sodium: 142 meq/L (ref 135–145)
Total Bilirubin: 0.6 mg/dL (ref 0.2–1.2)
Total Protein: 7.2 g/dL (ref 6.0–8.3)

## 2017-10-31 LAB — LIPID PANEL
Cholesterol: 202 mg/dL — ABNORMAL HIGH (ref 0–200)
HDL: 45.7 mg/dL (ref >39.00)
LDL Cholesterol: 138 mg/dL — ABNORMAL HIGH (ref 0–99)
NonHDL: 156.67
Total CHOL/HDL Ratio: 4
Triglycerides: 93 mg/dL (ref 0.0–149.0)
VLDL: 18.6 mg/dL (ref 0.0–40.0)

## 2017-10-31 LAB — URINALYSIS, ROUTINE W REFLEX MICROSCOPIC
Bilirubin Urine: NEGATIVE
Hgb urine dipstick: NEGATIVE
Ketones, ur: NEGATIVE
Leukocytes, UA: NEGATIVE
Nitrite: NEGATIVE
Specific Gravity, Urine: 1.025 (ref 1.000–1.030)
Total Protein, Urine: NEGATIVE
Urine Glucose: NEGATIVE
Urobilinogen, UA: 0.2 (ref 0.0–1.0)
pH: 5 (ref 5.0–8.0)

## 2017-10-31 LAB — CBC
HCT: 42.5 % (ref 39.0–52.0)
Hemoglobin: 14.3 g/dL (ref 13.0–17.0)
MCHC: 33.7 g/dL (ref 30.0–36.0)
MCV: 93.2 fl (ref 78.0–100.0)
Platelets: 201 10*3/uL (ref 150.0–400.0)
RBC: 4.56 Mil/uL (ref 4.22–5.81)
RDW: 14.3 % (ref 11.5–15.5)
WBC: 6 10*3/uL (ref 4.0–10.5)

## 2017-10-31 LAB — VITAMIN B12: Vitamin B-12: 1056 pg/mL — ABNORMAL HIGH (ref 211–911)

## 2017-10-31 LAB — TSH: TSH: 1.62 u[IU]/mL (ref 0.35–4.50)

## 2017-10-31 LAB — VITAMIN D 25 HYDROXY (VIT D DEFICIENCY, FRACTURES): VITD: 21.48 ng/mL — ABNORMAL LOW (ref 30.00–100.00)

## 2017-10-31 MED ORDER — DIPHENOXYLATE-ATROPINE 2.5-0.025 MG PO TABS: 1.0000 | ORAL_TABLET | Freq: Four times a day (QID) | ORAL | 1 refills | Status: DC | PRN

## 2017-10-31 NOTE — Assessment & Plan Note (Signed)
Checking lipid panel, off meds.

## 2017-10-31 NOTE — Assessment & Plan Note (Signed)
Refill lomotil which he uses as needed. This is the only medication which has provided relief when needed. He has had thorough evaluation in the past and at 8194 does not want to pursue any more evaluation.

## 2017-10-31 NOTE — Assessment & Plan Note (Signed)
Checking U/A 

## 2017-10-31 NOTE — Assessment & Plan Note (Signed)
Flu shot yearly. Pneumonia complete. Shingrix declines. Tetanus up to date. Colonoscopy aged out. Counseled about sun safety and mole surveillance. Counseled about the dangers of distracted driving. Given 10 year screening recommendations.

## 2017-10-31 NOTE — Patient Instructions (Signed)
We will check the labs today.   Use the artifical tears on the eyes in the evening to see if this helps in the morning.  We have sent in the medicine for diarrhea for you.    Health Maintenance, Male A healthy lifestyle and preventive care is important for your health and wellness. Ask your health care provider about what schedule of regular examinations is right for you. What should I know about weight and diet? Eat a Healthy Diet  Eat plenty of vegetables, fruits, whole grains, low-fat dairy products, and lean protein.  Do not eat a lot of foods high in solid fats, added sugars, or salt.  Maintain a Healthy Weight Regular exercise can help you achieve or maintain a healthy weight. You should:  Do at least 150 minutes of exercise each week. The exercise should increase your heart rate and make you sweat (moderate-intensity exercise).  Do strength-training exercises at least twice a week.  Watch Your Levels of Cholesterol and Blood Lipids  Have your blood tested for lipids and cholesterol every 5 years starting at 82 years of age. If you are at high risk for heart disease, you should start having your blood tested when you are 82 years old. You may need to have your cholesterol levels checked more often if: ? Your lipid or cholesterol levels are high. ? You are older than 82 years of age. ? You are at high risk for heart disease.  What should I know about cancer screening? Many types of cancers can be detected early and may often be prevented. Lung Cancer  You should be screened every year for lung cancer if: ? You are a current smoker who has smoked for at least 30 years. ? You are a former smoker who has quit within the past 15 years.  Talk to your health care provider about your screening options, when you should start screening, and how often you should be screened.  Colorectal Cancer  Routine colorectal cancer screening usually begins at 82 years of age and should be  repeated every 5-10 years until you are 82 years old. You may need to be screened more often if early forms of precancerous polyps or small growths are found. Your health care provider may recommend screening at an earlier age if you have risk factors for colon cancer.  Your health care provider may recommend using home test kits to check for hidden blood in the stool.  A small camera at the end of a tube can be used to examine your colon (sigmoidoscopy or colonoscopy). This checks for the earliest forms of colorectal cancer.  Prostate and Testicular Cancer  Depending on your age and overall health, your health care provider may do certain tests to screen for prostate and testicular cancer.  Talk to your health care provider about any symptoms or concerns you have about testicular or prostate cancer.  Skin Cancer  Check your skin from head to toe regularly.  Tell your health care provider about any new moles or changes in moles, especially if: ? There is a change in a mole's size, shape, or color. ? You have a mole that is larger than a pencil eraser.  Always use sunscreen. Apply sunscreen liberally and repeat throughout the day.  Protect yourself by wearing long sleeves, pants, a wide-brimmed hat, and sunglasses when outside.  What should I know about heart disease, diabetes, and high blood pressure?  If you are 3918-82 years of age, have your blood  pressure checked every 3-5 years. If you are 69 years of age or older, have your blood pressure checked every year. You should have your blood pressure measured twice-once when you are at a hospital or clinic, and once when you are not at a hospital or clinic. Record the average of the two measurements. To check your blood pressure when you are not at a hospital or clinic, you can use: ? An automated blood pressure machine at a pharmacy. ? A home blood pressure monitor.  Talk to your health care provider about your target blood  pressure.  If you are between 10-25 years old, ask your health care provider if you should take aspirin to prevent heart disease.  Have regular diabetes screenings by checking your fasting blood sugar level. ? If you are at a normal weight and have a low risk for diabetes, have this test once every three years after the age of 101. ? If you are overweight and have a high risk for diabetes, consider being tested at a younger age or more often.  A one-time screening for abdominal aortic aneurysm (AAA) by ultrasound is recommended for men aged 70-75 years who are current or former smokers. What should I know about preventing infection? Hepatitis B If you have a higher risk for hepatitis B, you should be screened for this virus. Talk with your health care provider to find out if you are at risk for hepatitis B infection. Hepatitis C Blood testing is recommended for:  Everyone born from [birth year] through 1963.  Anyone with known risk factors for hepatitis C.  Sexually Transmitted Diseases (STDs)  You should be screened each year for STDs including gonorrhea and chlamydia if: ? You are sexually active and are younger than 82 years of age. ? You are older than 82 years of age and your health care provider tells you that you are at risk for this type of infection. ? Your sexual activity has changed since you were last screened and you are at an increased risk for chlamydia or gonorrhea. Ask your health care provider if you are at risk.  Talk with your health care provider about whether you are at high risk of being infected with HIV. Your health care provider may recommend a prescription medicine to help prevent HIV infection.  What else can I do?  Schedule regular health, dental, and eye exams.  Stay current with your vaccines (immunizations).  Do not use any tobacco products, such as cigarettes, chewing tobacco, and e-cigarettes. If you need help quitting, ask your health care  provider.  Limit alcohol intake to no more than 2 drinks per day. One drink equals 12 ounces of beer, 5 ounces of wine, or 1 ounces of hard liquor.  Do not use street drugs.  Do not share needles.  Ask your health care provider for help if you need support or information about quitting drugs.  Tell your health care provider if you often feel depressed.  Tell your health care provider if you have ever been abused or do not feel safe at home. This information is not intended to replace advice given to you by your health care provider. Make sure you discuss any questions you have with your health care provider. Document Released: 09/21/2007 Document Revised: 11/22/2015 Document Reviewed: 12/27/2014 Elsevier Interactive Patient Education  [employer].

## 2017-10-31 NOTE — Assessment & Plan Note (Signed)
EKG done and stable from prior. Checking TSH, B12, vitamin D, CBC, CMP labs to rule out metabolic etiology. Could be related to normal aging.

## 2017-10-31 NOTE — Progress Notes (Signed)
   Subjective:    Patient ID: Donnelly AngelicaLee L Hoskin, male    DOB: 16-Oct-1920, 84 y.o.   MRN: [id number]  HPI Here for medicare wellness, no new complaints. Please see A/P for status and treatment of chronic medical problems.   HPI #2: Here for follow up of diarrhea (taking lomotil only when needed, caused by dietary changes, denies blood in stool, overall improved since 2 years ago), and new problem of fatigue (he is getting tired easily, denies chest pain or pressure, is not taking his B12 anymore, denies weight change, denies rash).   Diet: heart healthy Physical activity: sedentary Depression/mood screen: negative Hearing: moderate loss bilaterally Visual acuity: grossly normal with lens, performs annual eye exam  ADLs: capable Fall risk: medium Home safety: good Cognitive evaluation: intact to orientation, naming, recall and repetition EOL planning: adv directives discussed  I have personally reviewed and have noted 1. The patient's medical and social history - reviewed today no changes 2. Their use of alcohol, tobacco or illicit drugs 3. Their current medications and supplements 4. The patient's functional ability including ADL's, fall risks, home safety risks and hearing or visual impairment. 5. Diet and physical activities 6. Evidence for depression or mood disorders 7. Care team reviewed and updated (available in snapshot)  Review of Systems  Constitutional: Positive for fatigue.  HENT: Positive for hearing loss.   Eyes: Negative.   Respiratory: Negative for cough, chest tightness and shortness of breath.   Cardiovascular: Negative for chest pain, palpitations and leg swelling.  Gastrointestinal: Negative for abdominal distention, abdominal pain, constipation, diarrhea, nausea and vomiting.  Endocrine: Negative.   Musculoskeletal: Positive for gait problem.       Falls  Skin: Negative.   Neurological: Negative for dizziness, speech difficulty, weakness, light-headedness and  numbness.  Psychiatric/Behavioral: Negative.       Objective:   Physical Exam  Constitutional: He is oriented to person, place, and time. He appears well-developed and well-nourished.  HENT:  Head: Normocephalic and atraumatic.  Hearing loss moderate  Eyes: EOM are normal.  Dry eyes  Neck: Normal range of motion.  Cardiovascular: Normal rate and regular rhythm.  Pulmonary/Chest: Effort normal and breath sounds normal. No respiratory distress. He has no wheezes. He has no rales.  Abdominal: Soft. Bowel sounds are normal. He exhibits no distension. There is no tenderness. There is no rebound.  Musculoskeletal: He exhibits no edema.  Neurological: He is alert and oriented to person, place, and time. Coordination abnormal.  Slow to stand, steady gait  Skin: Skin is warm and dry.  Psychiatric: He has a normal mood and affect.   Vitals:   10/31/17 1309  BP: 110/70  Pulse: 66  Temp: 97.6 F (36.4 C)  TempSrc: Oral  SpO2: 96%  Weight: 181 lb (82.1 kg)  Height: 5\' 10"  (1.778 m)   EKG: Rate 65, axis normal, intervals normal, sinus, no st or t wave changes. No prior to compare.     Assessment & Plan:

## 2017-12-01 ENCOUNTER — Encounter: Payer: Self-pay | Admitting: Internal Medicine

## 2017-12-12 ENCOUNTER — Telehealth: Payer: Self-pay | Admitting: Internal Medicine

## 2017-12-12 NOTE — Telephone Encounter (Signed)
Copied from CRM [id number]. Topic: Inquiry >> Dec 12, 2017  4:51 PM Terisa Starr wrote:   Patient's daughter Marat, called and wanted to know could the prescription for his wheelchair be mailed to her? She said that she was called a few weeks ago and said it was left at the front. She would like that mailed to her @ [address] [place] [address]

## 2017-12-15 NOTE — Telephone Encounter (Signed)
Rx mailed.

## 2019-01-04 ENCOUNTER — Other Ambulatory Visit: Payer: Self-pay

## 2019-01-04 DIAGNOSIS — Z20822 Contact with and (suspected) exposure to covid-19: Secondary | ICD-10-CM

## 2019-01-05 LAB — NOVEL CORONAVIRUS, NAA: SARS-CoV-2, NAA: NOT DETECTED

## 2019-05-07 ENCOUNTER — Ambulatory Visit (INDEPENDENT_AMBULATORY_CARE_PROVIDER_SITE_OTHER): Payer: Medicare Other | Admitting: Internal Medicine

## 2019-05-07 ENCOUNTER — Telehealth: Payer: Self-pay

## 2019-05-07 ENCOUNTER — Ambulatory Visit (INDEPENDENT_AMBULATORY_CARE_PROVIDER_SITE_OTHER): Payer: Medicare Other

## 2019-05-07 ENCOUNTER — Other Ambulatory Visit: Payer: Self-pay

## 2019-05-07 ENCOUNTER — Encounter: Payer: Self-pay | Admitting: Internal Medicine

## 2019-05-07 VITALS — BP 114/66 | HR 64 | Temp 97.8°F | Ht 70.0 in

## 2019-05-07 DIAGNOSIS — M7731 Calcaneal spur, right foot: Secondary | ICD-10-CM | POA: Diagnosis not present

## 2019-05-07 DIAGNOSIS — M25571 Pain in right ankle and joints of right foot: Secondary | ICD-10-CM

## 2019-05-07 DIAGNOSIS — Z0000 Encounter for general adult medical examination without abnormal findings: Secondary | ICD-10-CM

## 2019-05-07 DIAGNOSIS — E785 Hyperlipidemia, unspecified: Secondary | ICD-10-CM

## 2019-05-07 DIAGNOSIS — G8929 Other chronic pain: Secondary | ICD-10-CM

## 2019-05-07 DIAGNOSIS — M545 Low back pain: Secondary | ICD-10-CM | POA: Diagnosis not present

## 2019-05-07 DIAGNOSIS — R197 Diarrhea, unspecified: Secondary | ICD-10-CM

## 2019-05-07 DIAGNOSIS — S99911A Unspecified injury of right ankle, initial encounter: Secondary | ICD-10-CM | POA: Diagnosis not present

## 2019-05-07 DIAGNOSIS — M5441 Lumbago with sciatica, right side: Secondary | ICD-10-CM

## 2019-05-07 DIAGNOSIS — M7989 Other specified soft tissue disorders: Secondary | ICD-10-CM | POA: Diagnosis not present

## 2019-05-07 LAB — COMPREHENSIVE METABOLIC PANEL
ALT: 17 U/L (ref 0–53)
AST: 25 U/L (ref 0–37)
Albumin: 4.4 g/dL (ref 3.5–5.2)
Alkaline Phosphatase: 79 U/L (ref 39–117)
BUN: 18 mg/dL (ref 6–23)
CO2: 29 mEq/L (ref 19–32)
Calcium: 9.9 mg/dL (ref 8.4–10.5)
Chloride: 104 mEq/L (ref 96–112)
Creatinine, Ser: 1.05 mg/dL (ref 0.40–1.50)
GFR: 65.46 mL/min (ref >60.00)
Glucose, Bld: 93 mg/dL (ref 70–99)
Potassium: 5.2 mEq/L — ABNORMAL HIGH (ref 3.5–5.1)
Sodium: 139 mEq/L (ref 135–145)
Total Bilirubin: 0.6 mg/dL (ref 0.2–1.2)
Total Protein: 7.3 g/dL (ref 6.0–8.3)

## 2019-05-07 LAB — CBC
HCT: 43.2 % (ref 39.0–52.0)
Hemoglobin: 14 g/dL (ref 13.0–17.0)
MCHC: 32.5 g/dL (ref 30.0–36.0)
MCV: 95.5 fl (ref 78.0–100.0)
Platelets: 258 10*3/uL (ref 150.0–400.0)
RBC: 4.53 Mil/uL (ref 4.22–5.81)
RDW: 13.5 % (ref 11.5–15.5)
WBC: 6.5 10*3/uL (ref 4.0–10.5)

## 2019-05-07 LAB — LIPID PANEL
Cholesterol: 202 mg/dL — ABNORMAL HIGH (ref 0–200)
HDL: 45.9 mg/dL (ref >39.00)
LDL Cholesterol: 134 mg/dL — ABNORMAL HIGH (ref 0–99)
NonHDL: 156.09
Total CHOL/HDL Ratio: 4
Triglycerides: 112 mg/dL (ref 0.0–149.0)
VLDL: 22.4 mg/dL (ref 0.0–40.0)

## 2019-05-07 NOTE — Assessment & Plan Note (Addendum)
Checking lumbar x-ray given the new weakness/numbness in the leg. We also talked about possibility of stroke causing symptoms although this seems less likely given the acuteness after a fall of symptoms. Rx manual wheelchair.

## 2019-05-07 NOTE — Assessment & Plan Note (Signed)
Not bad in the last year or so. Uses lomotil prn.

## 2019-05-07 NOTE — Progress Notes (Signed)
Subjective:   Patient ID: Cory Hansen, male    DOB: 1921-02-15, 86 y.o.   MRN: [id number]  HPI Here for medicare wellness and physical, no new complaints. Please see A/P for status and treatment of chronic medical problems.   HPI #3: Having acute issue of fall last week with weakness and less feeling in the right leg. Not sure exactly sure when the numbness started. Having some swelling in the right ankle since that time. Has not been able to stand since that happened. Denies pain in the ankle, knee, or hip. Some twinge in the low back but he does not feel that this is severe. Denies loss of bowels or bladder or sensation in same areas.   Diet: heart healthy  Physical activity: sedentary Depression/mood screen: negative Hearing: moderate loss bilaterally Visual acuity: with lens, some deterioration with macular degeneration, performs annual eye exam  ADLs: needs assist with all right now Fall risk: moderate Home safety: good Cognitive evaluation: intact to orientation, naming, recall and repetition EOL planning: adv directives discussed    Office Visit from 05/07/2019 in [place] at [facility] Total Score  0      I have personally reviewed and have noted 1. The patient's medical and social history - reviewed today no changes 2. Their use of alcohol, tobacco or illicit drugs 3. Their current medications and supplements 4. The patient's functional ability including ADL's, fall risks, home safety risks and hearing or visual impairment. 5. Diet and physical activities 6. Evidence for depression or mood disorders 7. Care team reviewed and updated  Patient Care Team: Hoyt Koch, MD as PCP - General (Internal Medicine) Past Medical History:  Diagnosis Date  . Arthritis   . Kidney stones   . Shingles    Past Surgical History:  Procedure Laterality Date  . AMPUTATION Left 02/22/2014   Procedure: INCISION AND DRAINAGE LEFT FOREFOOT AMPUTATION FIRST  RAY;  Surgeon: Wylene Simmer, MD;  Location: [facility];  Service: Orthopedics;  Laterality: Left;  . APPENDECTOMY    . EYE SURGERY Bilateral    cataract surgery w/ lens implant  . HERNIA REPAIR     inguinal hernia repair   History reviewed. No pertinent family history.  Review of Systems  Constitutional: Positive for activity change. Negative for appetite change, fatigue, fever and unexpected weight change.  HENT: Negative.   Eyes: Negative.   Respiratory: Negative for cough, chest tightness and shortness of breath.   Cardiovascular: Negative for chest pain, palpitations and leg swelling.  Gastrointestinal: Negative for abdominal distention, abdominal pain, constipation, diarrhea, nausea and vomiting.  Musculoskeletal: Positive for gait problem.  Skin: Negative.   Neurological: Positive for weakness and numbness.  Psychiatric/Behavioral: Negative.     Objective:  Physical Exam Constitutional:      Appearance: He is well-developed.  HENT:     Head: Normocephalic and atraumatic.  Cardiovascular:     Rate and Rhythm: Normal rate and regular rhythm.  Pulmonary:     Effort: Pulmonary effort is normal. No respiratory distress.     Breath sounds: Normal breath sounds. No wheezing or rales.  Abdominal:     General: Bowel sounds are normal. There is no distension.     Palpations: Abdomen is soft.     Tenderness: There is no abdominal tenderness. There is no rebound.  Musculoskeletal:        General: No swelling or tenderness.     Cervical back: Normal range of motion.  Right lower leg: Edema present.     Comments: Right ankle with swelling, some redness on the lateral side of the ankle, no cellulitis apparent, numb and no pain except with some eversion of the ankle  Skin:    General: Skin is warm and dry.  Neurological:     Mental Status: He is alert and oriented to person, place, and time.     Cranial Nerves: Cranial nerve deficit present.     Coordination: Coordination normal.      Comments: Loss of sensation to fine touch right leg to the thigh     Vitals:   05/07/19 1305  BP: 114/66  Pulse: 64  Temp: 97.8 F (36.6 C)  TempSrc: Oral  Height: 5\' 10"  (1.778 m)    This visit occurred during the SARS-CoV-2 public health emergency.  Safety protocols were in place, including screening questions prior to the visit, additional usage of staff PPE, and extensive cleaning of exam room while observing appropriate contact time as indicated for disinfecting solutions.   Assessment & Plan:

## 2019-05-07 NOTE — Assessment & Plan Note (Signed)
Checking lipid panel and adjust as needed. Not on medications.  °

## 2019-05-07 NOTE — Telephone Encounter (Signed)
Did DME order, copy to you Kathie Rhodes

## 2019-05-07 NOTE — Assessment & Plan Note (Signed)
Checking x-ray foot/ankle today given the new inability to walk after fall.

## 2019-05-07 NOTE — Patient Instructions (Signed)
We have cleaned out the right ear.   We are checking the labs and the x-ray today   Health Maintenance, Male Adopting a healthy lifestyle and getting preventive care are important in promoting health and wellness. Ask your health care provider about:  The right schedule for you to have regular tests and exams.  Things you can do on your own to prevent diseases and keep yourself healthy. What should I know about diet, weight, and exercise? Eat a healthy diet   Eat a diet that includes plenty of vegetables, fruits, low-fat dairy products, and lean protein.  Do not eat a lot of foods that are high in solid fats, added sugars, or sodium. Maintain a healthy weight Body mass index (BMI) is a measurement that can be used to identify possible weight problems. It estimates body fat based on height and weight. Your health care provider can help determine your BMI and help you achieve or maintain a healthy weight. Get regular exercise Get regular exercise. This is one of the most important things you can do for your health. Most adults should:  Exercise for at least 150 minutes each week. The exercise should increase your heart rate and make you sweat (moderate-intensity exercise).  Do strengthening exercises at least twice a week. This is in addition to the moderate-intensity exercise.  Spend less time sitting. Even light physical activity can be beneficial. Watch cholesterol and blood lipids Have your blood tested for lipids and cholesterol at 84 years of age, then have this test every 5 years. You may need to have your cholesterol levels checked more often if:  Your lipid or cholesterol levels are high.  You are older than 84 years of age.  You are at high risk for heart disease. What should I know about cancer screening? Many types of cancers can be detected early and may often be prevented. Depending on your health history and family history, you may need to have cancer screening at  various ages. This may include screening for:  Colorectal cancer.  Prostate cancer.  Skin cancer.  Lung cancer. What should I know about heart disease, diabetes, and high blood pressure? Blood pressure and heart disease  High blood pressure causes heart disease and increases the risk of stroke. This is more likely to develop in people who have high blood pressure readings, are of African descent, or are overweight.  Talk with your health care provider about your target blood pressure readings.  Have your blood pressure checked: ? Every 3-5 years if you are 74-70 years of age. ? Every year if you are 63 years old or older.  If you are between the ages of 2 and 46 and are a current or former smoker, ask your health care provider if you should have a one-time screening for abdominal aortic aneurysm (AAA). Diabetes Have regular diabetes screenings. This checks your fasting blood sugar level. Have the screening done:  Once every three years after age 58 if you are at a normal weight and have a low risk for diabetes.  More often and at a younger age if you are overweight or have a high risk for diabetes. What should I know about preventing infection? Hepatitis B If you have a higher risk for hepatitis B, you should be screened for this virus. Talk with your health care provider to find out if you are at risk for hepatitis B infection. Hepatitis C Blood testing is recommended for:  Everyone born from [birth year] through 1963.  Anyone with known risk factors for hepatitis C. Sexually transmitted infections (STIs)  You should be screened each year for STIs, including gonorrhea and chlamydia, if: ? You are sexually active and are younger than 84 years of age. ? You are older than 84 years of age and your health care provider tells you that you are at risk for this type of infection. ? Your sexual activity has changed since you were last screened, and you are at increased risk for chlamydia  or gonorrhea. Ask your health care provider if you are at risk.  Ask your health care provider about whether you are at high risk for HIV. Your health care provider may recommend a prescription medicine to help prevent HIV infection. If you choose to take medicine to prevent HIV, you should first get tested for HIV. You should then be tested every 3 months for as long as you are taking the medicine. Follow these instructions at home: Lifestyle  Do not use any products that contain nicotine or tobacco, such as cigarettes, e-cigarettes, and chewing tobacco. If you need help quitting, ask your health care provider.  Do not use street drugs.  Do not share needles.  Ask your health care provider for help if you need support or information about quitting drugs. Alcohol use  Do not drink alcohol if your health care provider tells you not to drink.  If you drink alcohol: ? Limit how much you have to 0-2 drinks a day. ? Be aware of how much alcohol is in your drink. In the [place] one drink equals one 12 oz bottle of beer (355 mL), one 5 oz glass of wine (148 mL), or one 1 oz glass of hard liquor (44 mL). General instructions  Schedule regular health, dental, and eye exams.  Stay current with your vaccines.  Tell your health care provider if: ? You often feel depressed. ? You have ever been abused or do not feel safe at home. Summary  Adopting a healthy lifestyle and getting preventive care are important in promoting health and wellness.  Follow your health care provider's instructions about healthy diet, exercising, and getting tested or screened for diseases.  Follow your health care provider's instructions on monitoring your cholesterol and blood pressure. This information is not intended to replace advice given to you by your health care provider. Make sure you discuss any questions you have with your health care provider. Document Revised: 03/18/2018 Document Reviewed:  03/18/2018 Elsevier Patient Education  2020 [employer].

## 2019-05-07 NOTE — Addendum Note (Signed)
Addended by: Hillard Danker A on: 05/07/2019 02:57 PM   Modules accepted: Orders

## 2019-05-07 NOTE — Assessment & Plan Note (Signed)
Flu shot declines. Pneumonia complete. Shingrix declines. Tetanus due 2025. Colonoscopy aged out. Counseled about sun safety and mole surveillance. Counseled about the dangers of distracted driving. Given 10 year screening recommendations.

## 2019-05-07 NOTE — Telephone Encounter (Signed)
Daughter would like to know about an order for a wheelchair. I did not see anything in OV notes  Please advise

## 2019-05-21 ENCOUNTER — Telehealth: Payer: Self-pay

## 2019-05-21 NOTE — Telephone Encounter (Signed)
New message    Daughter [name] calling to check on the status of the wheelchair prescription.

## 2019-05-24 ENCOUNTER — Encounter: Payer: Self-pay | Admitting: Internal Medicine

## 2019-05-25 NOTE — Telephone Encounter (Signed)
See my chart message

## 2019-08-16 ENCOUNTER — Telehealth: Payer: Self-pay | Admitting: Internal Medicine

## 2019-08-16 ENCOUNTER — Encounter: Payer: Self-pay | Admitting: Internal Medicine

## 2019-08-16 NOTE — Telephone Encounter (Signed)
A  mychart message has been sent to [place] concerning this.   Please see message

## 2019-08-16 NOTE — Telephone Encounter (Signed)
  Patients daughter [name] calling to request order for hospital bed, home PT, and DNR forms advice. Declined appointment at this time, stating patient is too hard to transport

## 2019-08-17 ENCOUNTER — Telehealth: Payer: Self-pay

## 2019-08-17 NOTE — Telephone Encounter (Signed)
Spoke with patients  daughter she is requesting a prescription for transport wheelchair, home therapy, electronic bed, and a bedside table to sent to [facility] [phone]  Patient had taken first COVID vaccine on 08-12-2019, and four days later patient couldn't get up out of wheelchair couldn't move.   Is it safe for the second vaccine.

## 2019-08-17 NOTE — Telephone Encounter (Signed)
We would have to have video visit at least for insurance compliance for orders for any of those things except DNR form.

## 2019-08-17 NOTE — Telephone Encounter (Signed)
MYChart appointment scheduled for 1:40 on thursday

## 2019-08-18 NOTE — Telephone Encounter (Signed)
This is now 3rd message about same. They already have a visit scheduled and can address then but not being able to move is serious. He could have had a stroke at that time or some other problem and should be encouraged to go to ER for evaluation since visit is not until Thursday.

## 2019-08-18 NOTE — Telephone Encounter (Signed)
Spoke with daughter [name], informed her of below message from Doctor.   Daughter said that patient is currently okay now, the situation only lasted one day.   Granddaughter who is a Engineer, civil (consulting) said that patient was dehydrated

## 2019-08-19 ENCOUNTER — Encounter: Payer: Self-pay | Admitting: Internal Medicine

## 2019-08-19 ENCOUNTER — Telehealth (INDEPENDENT_AMBULATORY_CARE_PROVIDER_SITE_OTHER): Payer: Medicare Other | Admitting: Internal Medicine

## 2019-08-19 DIAGNOSIS — R0602 Shortness of breath: Secondary | ICD-10-CM | POA: Insufficient documentation

## 2019-08-19 DIAGNOSIS — R531 Weakness: Secondary | ICD-10-CM

## 2019-08-19 NOTE — Assessment & Plan Note (Signed)
We talked about how likely symptoms were related to covid-19 vaccine. It would be up to them if they decide to proceed with second injection. There would be no contraindication to that. We discussed likely protection rates from covid-19 infection/serious infection with and without second dose pfizer. First dose added to chart. Needs hospital bed, home health, manual transport wheelchair and bedside tray. Orders placed today.

## 2019-08-19 NOTE — Assessment & Plan Note (Signed)
Does have prior changes of asbestos on lungs which he has not pursued and does not wish to pursue at this time.

## 2019-08-19 NOTE — Progress Notes (Signed)
Virtual Visit via Video Note  I connected with Cory Hansen on 08/19/19 at  1:40 PM EDT by a video enabled telemedicine application and verified that I am speaking with the correct person using two identifiers.  The patient and the provider were at separate locations throughout the entire encounter. Patient location: home, Provider location: work   I discussed the limitations of evaluation and management by telemedicine and the availability of in person appointments. The patient expressed understanding and agreed to proceed. The patient and the provider were the only parties present for the visit unless noted in HPI below.  History of Present Illness: The patient is a 86 y.o. man with visit for weakness and SOB. Started about 4-5 days after receiving pfizer vaccine Covid-19 he was very weak and unable to stand. His urine was very dark that day. No pain with urination. Symptoms improved the next day and he is still some weak now but much closer to normal. Overall his functioning has declined some in the last few months. They need wheelchair for ambulation within the home and hospital bed. His daughters are caring for him now and they cannot get him out of bed at this time. He also gets SOB and needs head elevation more than his current bed can accommodate.   Wheelchair: transport to home health company Patient suffers from shortness of breath which impairs their ability to perform daily activities like bathing and feeding in the home.  A walker will not resolve issue with performing activities of daily living. A wheelchair will allow patient to safely perform daily activities. Patient can safely propel the wheelchair in the home or has a caregiver who can provide assistance. Length of need Lifetime.  The patient requires a hospital bed because he/she requires positioning of the body in ways not feasible with an ordinary bed or to relieve pain. He needs to be able to reposition immediately and needs head  of bed to elevate to at least 30 degrees.   The patient requires a regular hospital bed.  A gel overlay is required as the patient has limited mobility AND the patient has fecal or urinary incontinence.  Observations/Objective: Appearance: frail, breathing appears normal, casual grooming, abdomen does not appear distended, throat normal, memory normal, mental status is A and O times 3  Assessment and Plan: See problem oriented charting  Follow Up Instructions: referral for home health, hospital bed, wheelchair today  I discussed the assessment and treatment plan with the patient. The patient was provided an opportunity to ask questions and all were answered. The patient agreed with the plan and demonstrated an understanding of the instructions.   The patient was advised to call back or seek an in-person evaluation if the symptoms worsen or if the condition fails to improve as anticipated.  [name], MD

## 2019-08-31 ENCOUNTER — Telehealth: Payer: Self-pay

## 2019-08-31 NOTE — Telephone Encounter (Signed)
New message:   Pt's daughter is calling and states they brought the wrong wheelchair and they are needing a new order for a different one. Please advise.

## 2019-08-31 NOTE — Telephone Encounter (Signed)
Order in system can just send community message to [facility] to see why not fulfilled

## 2019-08-31 NOTE — Telephone Encounter (Signed)
Spoke with daughter patient needs a transport wheelchair order.  Will refax another order to [facility]

## 2019-09-01 NOTE — Telephone Encounter (Signed)
Refaxed and confirmed DME order again to [facility] and circled Transport Wheelchair that was already stated at the bottom of the order

## 2019-09-09 ENCOUNTER — Telehealth: Payer: Self-pay

## 2019-09-09 NOTE — Telephone Encounter (Signed)
New message    [facility] calling asking for last office visit notes with supporting documentation for transport chair.    please advise

## 2019-09-10 DIAGNOSIS — R0602 Shortness of breath: Secondary | ICD-10-CM | POA: Diagnosis not present

## 2019-09-10 DIAGNOSIS — Z7409 Other reduced mobility: Secondary | ICD-10-CM | POA: Diagnosis not present

## 2019-09-10 DIAGNOSIS — R531 Weakness: Secondary | ICD-10-CM | POA: Diagnosis not present

## 2019-09-10 NOTE — Telephone Encounter (Signed)
Faxed and confirmed notes to fax number provider

## 2019-09-10 NOTE — Telephone Encounter (Signed)
    [facility] calling to discuss additional information needed on Face to Face documentaion Fax [phone] Phone Marylene Land at [phone]  (See 5/25 telephone encounter)

## 2019-09-14 DIAGNOSIS — R531 Weakness: Secondary | ICD-10-CM | POA: Diagnosis not present

## 2019-09-14 DIAGNOSIS — R0602 Shortness of breath: Secondary | ICD-10-CM | POA: Diagnosis not present

## 2019-10-10 DIAGNOSIS — Z7409 Other reduced mobility: Secondary | ICD-10-CM | POA: Diagnosis not present

## 2019-10-10 DIAGNOSIS — R531 Weakness: Secondary | ICD-10-CM | POA: Diagnosis not present

## 2019-10-10 DIAGNOSIS — R0602 Shortness of breath: Secondary | ICD-10-CM | POA: Diagnosis not present

## 2019-10-14 DIAGNOSIS — R531 Weakness: Secondary | ICD-10-CM | POA: Diagnosis not present

## 2019-10-14 DIAGNOSIS — R0602 Shortness of breath: Secondary | ICD-10-CM | POA: Diagnosis not present

## 2019-11-10 DIAGNOSIS — R531 Weakness: Secondary | ICD-10-CM | POA: Diagnosis not present

## 2019-11-10 DIAGNOSIS — R0602 Shortness of breath: Secondary | ICD-10-CM | POA: Diagnosis not present

## 2019-11-10 DIAGNOSIS — Z7409 Other reduced mobility: Secondary | ICD-10-CM | POA: Diagnosis not present

## 2019-11-14 DIAGNOSIS — R531 Weakness: Secondary | ICD-10-CM | POA: Diagnosis not present

## 2019-11-14 DIAGNOSIS — R0602 Shortness of breath: Secondary | ICD-10-CM | POA: Diagnosis not present

## 2019-12-11 DIAGNOSIS — Z7409 Other reduced mobility: Secondary | ICD-10-CM | POA: Diagnosis not present

## 2019-12-11 DIAGNOSIS — R0602 Shortness of breath: Secondary | ICD-10-CM | POA: Diagnosis not present

## 2019-12-11 DIAGNOSIS — R531 Weakness: Secondary | ICD-10-CM | POA: Diagnosis not present

## 2019-12-15 DIAGNOSIS — R531 Weakness: Secondary | ICD-10-CM | POA: Diagnosis not present

## 2019-12-15 DIAGNOSIS — R0602 Shortness of breath: Secondary | ICD-10-CM | POA: Diagnosis not present

## 2020-01-10 DIAGNOSIS — R0602 Shortness of breath: Secondary | ICD-10-CM | POA: Diagnosis not present

## 2020-01-10 DIAGNOSIS — R531 Weakness: Secondary | ICD-10-CM | POA: Diagnosis not present

## 2020-01-10 DIAGNOSIS — Z7409 Other reduced mobility: Secondary | ICD-10-CM | POA: Diagnosis not present

## 2020-01-14 DIAGNOSIS — R0602 Shortness of breath: Secondary | ICD-10-CM | POA: Diagnosis not present

## 2020-01-14 DIAGNOSIS — R531 Weakness: Secondary | ICD-10-CM | POA: Diagnosis not present

## 2020-02-10 DIAGNOSIS — R531 Weakness: Secondary | ICD-10-CM | POA: Diagnosis not present

## 2020-02-10 DIAGNOSIS — R0602 Shortness of breath: Secondary | ICD-10-CM | POA: Diagnosis not present

## 2020-02-10 DIAGNOSIS — Z7409 Other reduced mobility: Secondary | ICD-10-CM | POA: Diagnosis not present

## 2020-02-14 DIAGNOSIS — R0602 Shortness of breath: Secondary | ICD-10-CM | POA: Diagnosis not present

## 2020-02-14 DIAGNOSIS — R531 Weakness: Secondary | ICD-10-CM | POA: Diagnosis not present

## 2020-03-08 ENCOUNTER — Telehealth: Payer: Self-pay | Admitting: Internal Medicine

## 2020-03-08 ENCOUNTER — Emergency Department (HOSPITAL_COMMUNITY)
Admission: EM | Admit: 2020-03-08 | Discharge: 2020-03-08 | Disposition: A | Discharge status: Home / Self Care | Payer: Medicare Other | Attending: Emergency Medicine | Admitting: Emergency Medicine

## 2020-03-08 DIAGNOSIS — Z20822 Contact with and (suspected) exposure to covid-19: Secondary | ICD-10-CM | POA: Diagnosis not present

## 2020-03-08 DIAGNOSIS — K122 Cellulitis and abscess of mouth: Secondary | ICD-10-CM | POA: Diagnosis not present

## 2020-03-08 DIAGNOSIS — R070 Pain in throat: Secondary | ICD-10-CM | POA: Diagnosis present

## 2020-03-08 DIAGNOSIS — J3489 Other specified disorders of nose and nasal sinuses: Secondary | ICD-10-CM | POA: Insufficient documentation

## 2020-03-08 LAB — RESP PANEL BY RT-PCR (FLU A&B, COVID) ARPGX2
Influenza A by PCR: NEGATIVE
Influenza B by PCR: NEGATIVE
SARS Coronavirus 2 by RT PCR: NEGATIVE

## 2020-03-08 LAB — GROUP A STREP BY PCR: Group A Strep by PCR: NOT DETECTED

## 2020-03-08 MED ORDER — DEXAMETHASONE SODIUM PHOSPHATE 4 MG/ML IJ SOLN
4.0000 mg | Freq: Once | INTRAMUSCULAR | Status: AC
  Administered 2020-03-08: 4 mg
  Filled 2020-03-08: qty 1

## 2020-03-08 MED ORDER — IBUPROFEN 100 MG/5ML PO SUSP
200.0000 mg | Freq: Once | ORAL | Status: AC
  Administered 2020-03-08: 200 mg via ORAL
  Filled 2020-03-08: qty 10

## 2020-03-08 NOTE — Discharge Instructions (Addendum)
Please drink plenty of water and monitor your symptoms. If you have any persistent difficulty breathing because of the swelling of your uvula please return to the ER.  The medicine you took here in the ER should decrease the swelling over the next few hours.  By this evening you should have some improvement in her symptoms.  Full resolution of your symptoms may take several days.  Please continue to monitor your symptoms and follow-up with your primary care doctor.   Please use Tylenol or ibuprofen for pain.  You may use 200-400 mg (1-2 tabs) ibuprofen every 6 hours and 1000 mg of Tylenol every 6 hours.  You may choose to alternate between the 2.  This would be most effective.  Not to exceed 4 g of Tylenol within 24 hours.  Not to exceed 3200 mg ibuprofen 24 hours.

## 2020-03-08 NOTE — ED Triage Notes (Signed)
Pt here from home with c/o sore throat over the past day or so , throat is red and is slightly swollen nad

## 2020-03-08 NOTE — Telephone Encounter (Signed)
[facility] called   Patient's daughter thinks something is stuck in his  throat when he wakes up in the morning. She called and said he feels like he cant breathe because something is lodged in his throat.  [facility] advised: Go to ED Now  Patient understood and complied.

## 2020-03-08 NOTE — ED Provider Notes (Signed)
[facility] EMERGENCY DEPARTMENT Provider Note   CSN: [id number] Arrival date & time: 03/08/20  5956     History No chief complaint on file.   Cory Hansen is a 86 y.o. male.  HPI Patient is a 86 year old male with past medical history significant for HLD, arthritis  Patient is presented today with 2 days of sore throat with some sinus congestion and runny nose/rhinorrhea.  He denies any fevers or chills.  Patient states that he feels quite well apart from the fact that he is having postnasal drainage coming down his throat from his congestion which he either swallows or coughs back up.  He states that apart from those he is not having any major symptoms apart from at night he noticed last night that when he laid back his uvula seemed to swing into the back of his throat and caused him to gag.  Who states that he had no periods of apnea but "felt like I could not breathe "when this occurred.  His daughter is at bedside and states that he called her because of this.  This is what prompted the ER visit today.  He denies any chest pain, fevers, nausea, vomiting, diarrhea, abdominal pain, lightheadedness or dizziness.  No other significant symptoms.  No aggravating or mitigating factors.  He has tried no medications prior to arrival for his symptoms.    Past Medical History:  Diagnosis Date  . Arthritis   . Kidney stones   . Shingles     Patient Active Problem List   Diagnosis Date Noted  . SOB (shortness of breath) 08/19/2019  . Acute right ankle pain 05/07/2019  . Diarrhea 10/27/2015  . Urinary dribbling 07/26/2015  . Routine general medical examination at a health care facility 10/13/2014  . CT, CHEST, ABNORMAL 01/23/2009  . Chronic bilateral low back pain with right-sided sciatica 01/17/2009  . NEOPLASM, MALIGNANT, BLADDER, HX OF 01/17/2009  . Hx of benign neoplasm of prostate 01/17/2009  . Hyperlipidemia 01/16/2009  . ARTHRITIS 01/16/2009  . Weakness  01/16/2009    Past Surgical History:  Procedure Laterality Date  . AMPUTATION Left 02/22/2014   Procedure: INCISION AND DRAINAGE LEFT FOREFOOT AMPUTATION FIRST RAY;  Surgeon: Toni Arthurs, MD;  Location: [facility] OR;  Service: Orthopedics;  Laterality: Left;  . APPENDECTOMY    . EYE SURGERY Bilateral    cataract surgery w/ lens implant  . HERNIA REPAIR     inguinal hernia repair       No family history on file.  Social History   Tobacco Use  . Smoking status: Never Smoker  . Smokeless tobacco: Never Used  Substance Use Topics  . Alcohol use: Yes    Alcohol/week: 0.0 standard drinks    Comment: very rare  . Drug use: No    Home Medications Prior to Admission medications   Medication Sig Start Date End Date Taking? Authorizing Provider  diphenoxylate-atropine (LOMOTIL) 2.5-0.025 MG tablet Take 1 tablet by mouth 4 (four) times daily as needed for diarrhea or loose stools. 10/31/17   [name], MD  glucosamine-chondroitin 500-400 MG tablet Take 1 tablet by mouth 3 (three) times daily. Reported on 10/27/2015    [provider]  vitamin B-12 (CYANOCOBALAMIN) 1000 MCG tablet Take 1 tablet (1,000 mcg total) by mouth daily. 10/14/14   [name], MD    Allergies    Patient has no known allergies.  Review of Systems   Review of Systems  Constitutional: Negative for  chills and fever.  HENT: Positive for congestion and rhinorrhea.        Uvula swelling  Respiratory: Positive for cough. Negative for shortness of breath.   Cardiovascular: Negative for chest pain.  Gastrointestinal: Negative for abdominal pain.  Musculoskeletal: Negative for neck pain.    Physical Exam Updated Vital Signs BP 120/72   Pulse 81   Temp 98.2 F (36.8 C) (Oral)   Resp 17   Ht 5\' 11"  (1.803 m)   Wt 81.6 kg   SpO2 97%   BMI 25.10 kg/m   Physical Exam Vitals and nursing note reviewed.  Constitutional:      General: He is not in acute distress.    Appearance: Normal  appearance. He is not ill-appearing.  HENT:     Head: Normocephalic and atraumatic.     Mouth/Throat:     Mouth: Mucous membranes are moist.     Comments: Posterior pharynx with mild cobblestoning and scant erythema.  There is some swelling of the uvula which is midline.  It is only mildly edematous.  He has normal phonation. Eyes:     General: No scleral icterus.       Right eye: No discharge.        Left eye: No discharge.     Conjunctiva/sclera: Conjunctivae normal.  Neck:     Comments: No JVP Scant anterior cervical lymphadenopathy Cardiovascular:     Rate and Rhythm: Normal rate and regular rhythm.     Comments: Regular rate and rhythm, no murmurs rubs or gallops Pulmonary:     Effort: Pulmonary effort is normal.     Breath sounds: Normal breath sounds. No stridor.     Comments: Lungs are clear to auscultation all fields Musculoskeletal:     Cervical back: Normal range of motion.  Neurological:     Mental Status: He is alert and oriented to person, place, and time. Mental status is at baseline.     ED Results / Procedures / Treatments   Labs (all labs ordered are listed, but only abnormal results are displayed) Labs Reviewed  GROUP A STREP BY PCR  RESP PANEL BY RT-PCR (FLU A&B, COVID) [id number]    EKG None  Radiology No results found.  Procedures Procedures (including critical care time)  Medications Ordered in ED Medications  dexamethasone (DECADRON) injection 4 mg (4 mg Other Given 03/08/20 1302)  ibuprofen (ADVIL) 100 MG/5ML suspension 200 mg (200 mg Oral Given 03/08/20 1302)    ED Course  I have reviewed the triage vital signs and the nursing notes.  Pertinent labs & imaging results that were available during my care of the patient were reviewed by me and considered in my medical decision making (see chart for details).    MDM Rules/Calculators/A&P                          Patient is [age]-year-old male presented today with swelling in the back of his  throat.  Strep test was negative.  Physical exam is notable for mild anterior cervical lymphadenopathy as well as uvula swelling.  He has normal phonation is able to swallow and breathe without difficulty.  I discussed this case with my attending physician who cosigned this note including patient's presenting symptoms, physical exam, and planned diagnostics and interventions. Attending physician stated agreement with plan or made changes to plan which were implemented.   Attending physician assessed patient at bedside.  Patient provided with small dose  of Decadron orally with ibuprofen.  He will follow-up with his primary care doctor.  He was given strict return precautions.  Suspect that this will help with the swelling which is his primary concern.  He will use Tylenol and ibuprofen at home only low doses 200-400 mg of ibuprofen given his age.  He has no history of CKD and no history of GI bleeds or ulcers.  Nor does he have any history of heart failure.  Covid test pending.  Cory Hansen was evaluated in Emergency Department on 03/08/2020 for the symptoms described in the history of present illness. He was evaluated in the context of the global COVID-19 pandemic, which necessitated consideration that the patient might be at risk for infection with the SARS-CoV-2 virus that causes COVID-19. Institutional protocols and algorithms that pertain to the evaluation of patients at risk for COVID-19 are in a state of rapid change based on information released by regulatory bodies including the CDC and federal and state organizations. These policies and algorithms were followed during the patient's care in the ED.   Final Clinical Impression(s) / ED Diagnoses Final diagnoses:  Uvulitis    Rx / DC Orders ED Discharge Orders    None       Gailen Shelter, [place] 03/08/20 1347    Geoffery Lyons, MD 03/08/20 1642

## 2020-03-08 NOTE — Telephone Encounter (Signed)
Patients daughter [name] calling stating the patient told her this morning he felt like he had something stuck in his throat, transferred to team health to further evaluate.

## 2020-03-11 DIAGNOSIS — R0602 Shortness of breath: Secondary | ICD-10-CM | POA: Diagnosis not present

## 2020-03-11 DIAGNOSIS — R531 Weakness: Secondary | ICD-10-CM | POA: Diagnosis not present

## 2020-03-11 DIAGNOSIS — Z7409 Other reduced mobility: Secondary | ICD-10-CM | POA: Diagnosis not present

## 2020-03-14 ENCOUNTER — Telehealth: Payer: Self-pay | Admitting: Internal Medicine

## 2020-03-14 ENCOUNTER — Emergency Department (HOSPITAL_COMMUNITY): Payer: Medicare Other

## 2020-03-14 ENCOUNTER — Other Ambulatory Visit: Payer: Self-pay

## 2020-03-14 ENCOUNTER — Inpatient Hospital Stay (HOSPITAL_COMMUNITY)
Admission: EM | Admit: 2020-03-14 | Discharge: 2020-03-20 | DRG: 445 | Disposition: A | Discharge status: Skilled Nursing Facility | Payer: Medicare Other | Attending: Internal Medicine | Admitting: Internal Medicine

## 2020-03-14 ENCOUNTER — Encounter (HOSPITAL_COMMUNITY): Payer: Self-pay | Admitting: Student

## 2020-03-14 DIAGNOSIS — Z86018 Personal history of other benign neoplasm: Secondary | ICD-10-CM | POA: Diagnosis present

## 2020-03-14 DIAGNOSIS — Z66 Do not resuscitate: Secondary | ICD-10-CM | POA: Diagnosis present

## 2020-03-14 DIAGNOSIS — E785 Hyperlipidemia, unspecified: Secondary | ICD-10-CM | POA: Diagnosis not present

## 2020-03-14 DIAGNOSIS — L89151 Pressure ulcer of sacral region, stage 1: Secondary | ICD-10-CM | POA: Diagnosis not present

## 2020-03-14 DIAGNOSIS — R5381 Other malaise: Secondary | ICD-10-CM | POA: Diagnosis not present

## 2020-03-14 DIAGNOSIS — E860 Dehydration: Secondary | ICD-10-CM | POA: Diagnosis present

## 2020-03-14 DIAGNOSIS — Z79899 Other long term (current) drug therapy: Secondary | ICD-10-CM | POA: Diagnosis not present

## 2020-03-14 DIAGNOSIS — K8001 Calculus of gallbladder with acute cholecystitis with obstruction: Principal | ICD-10-CM | POA: Diagnosis present

## 2020-03-14 DIAGNOSIS — E876 Hypokalemia: Secondary | ICD-10-CM | POA: Diagnosis present

## 2020-03-14 DIAGNOSIS — R531 Weakness: Secondary | ICD-10-CM | POA: Diagnosis not present

## 2020-03-14 DIAGNOSIS — R4781 Slurred speech: Secondary | ICD-10-CM | POA: Diagnosis not present

## 2020-03-14 DIAGNOSIS — N179 Acute kidney failure, unspecified: Secondary | ICD-10-CM | POA: Diagnosis present

## 2020-03-14 DIAGNOSIS — E7800 Pure hypercholesterolemia, unspecified: Secondary | ICD-10-CM | POA: Diagnosis not present

## 2020-03-14 DIAGNOSIS — Z7189 Other specified counseling: Secondary | ICD-10-CM | POA: Diagnosis not present

## 2020-03-14 DIAGNOSIS — Z515 Encounter for palliative care: Secondary | ICD-10-CM | POA: Diagnosis not present

## 2020-03-14 DIAGNOSIS — M1993 Secondary osteoarthritis, unspecified site: Secondary | ICD-10-CM | POA: Diagnosis not present

## 2020-03-14 DIAGNOSIS — R0602 Shortness of breath: Secondary | ICD-10-CM | POA: Diagnosis not present

## 2020-03-14 DIAGNOSIS — R7401 Elevation of levels of liver transaminase levels: Secondary | ICD-10-CM | POA: Diagnosis not present

## 2020-03-14 DIAGNOSIS — K810 Acute cholecystitis: Secondary | ICD-10-CM | POA: Diagnosis present

## 2020-03-14 DIAGNOSIS — H3530 Unspecified macular degeneration: Secondary | ICD-10-CM | POA: Diagnosis present

## 2020-03-14 DIAGNOSIS — K828 Other specified diseases of gallbladder: Secondary | ICD-10-CM | POA: Diagnosis not present

## 2020-03-14 DIAGNOSIS — M5431 Sciatica, right side: Secondary | ICD-10-CM | POA: Diagnosis not present

## 2020-03-14 DIAGNOSIS — Z7401 Bed confinement status: Secondary | ICD-10-CM | POA: Diagnosis not present

## 2020-03-14 DIAGNOSIS — M1990 Unspecified osteoarthritis, unspecified site: Secondary | ICD-10-CM | POA: Diagnosis not present

## 2020-03-14 DIAGNOSIS — G8929 Other chronic pain: Secondary | ICD-10-CM | POA: Diagnosis not present

## 2020-03-14 DIAGNOSIS — M2550 Pain in unspecified joint: Secondary | ICD-10-CM | POA: Diagnosis not present

## 2020-03-14 DIAGNOSIS — K8020 Calculus of gallbladder without cholecystitis without obstruction: Secondary | ICD-10-CM | POA: Diagnosis not present

## 2020-03-14 DIAGNOSIS — Z87438 Personal history of other diseases of male genital organs: Secondary | ICD-10-CM | POA: Diagnosis present

## 2020-03-14 DIAGNOSIS — Z20822 Contact with and (suspected) exposure to covid-19: Secondary | ICD-10-CM | POA: Diagnosis present

## 2020-03-14 DIAGNOSIS — R29898 Other symptoms and signs involving the musculoskeletal system: Secondary | ICD-10-CM | POA: Diagnosis not present

## 2020-03-14 DIAGNOSIS — Z743 Need for continuous supervision: Secondary | ICD-10-CM | POA: Diagnosis not present

## 2020-03-14 DIAGNOSIS — R945 Abnormal results of liver function studies: Secondary | ICD-10-CM | POA: Diagnosis not present

## 2020-03-14 DIAGNOSIS — R269 Unspecified abnormalities of gait and mobility: Secondary | ICD-10-CM | POA: Diagnosis not present

## 2020-03-14 LAB — COMPREHENSIVE METABOLIC PANEL
ALT: 294 U/L — ABNORMAL HIGH (ref 0–44)
AST: 378 U/L — ABNORMAL HIGH (ref 15–41)
Albumin: 4 g/dL (ref 3.5–5.0)
Alkaline Phosphatase: 196 U/L — ABNORMAL HIGH (ref 38–126)
Anion gap: 14 (ref 5–15)
BUN: 33 mg/dL — ABNORMAL HIGH (ref 8–23)
CO2: 18 mmol/L — ABNORMAL LOW (ref 22–32)
Calcium: 9 mg/dL (ref 8.9–10.3)
Chloride: 105 mmol/L (ref 98–111)
Creatinine, Ser: 2.03 mg/dL — ABNORMAL HIGH (ref 0.61–1.24)
GFR, Estimated: 29 mL/min — ABNORMAL LOW (ref >60)
Glucose, Bld: 106 mg/dL — ABNORMAL HIGH (ref 70–99)
Potassium: 3.3 mmol/L — ABNORMAL LOW (ref 3.5–5.1)
Sodium: 137 mmol/L (ref 135–145)
Total Bilirubin: 4 mg/dL — ABNORMAL HIGH (ref 0.3–1.2)
Total Protein: 6.9 g/dL (ref 6.5–8.1)

## 2020-03-14 LAB — RESP PANEL BY RT-PCR (FLU A&B, COVID) ARPGX2
Influenza A by PCR: NEGATIVE
Influenza B by PCR: NEGATIVE
SARS Coronavirus 2 by RT PCR: NEGATIVE

## 2020-03-14 LAB — CK: Total CK: 5854 U/L — ABNORMAL HIGH (ref 49–397)

## 2020-03-14 LAB — AMMONIA: Ammonia: 47 umol/L — ABNORMAL HIGH (ref 9–35)

## 2020-03-14 LAB — CBC
HCT: 40.6 % (ref 39.0–52.0)
Hemoglobin: 13.2 g/dL (ref 13.0–17.0)
MCH: 31.4 pg (ref 26.0–34.0)
MCHC: 32.5 g/dL (ref 30.0–36.0)
MCV: 96.4 fL (ref 80.0–100.0)
Platelets: 214 10*3/uL (ref 150–400)
RBC: 4.21 MIL/uL — ABNORMAL LOW (ref 4.22–5.81)
RDW: 13.4 % (ref 11.5–15.5)
WBC: 13.3 10*3/uL — ABNORMAL HIGH (ref 4.0–10.5)
nRBC: 0 % (ref 0.0–0.2)

## 2020-03-14 LAB — CBG MONITORING, ED: Glucose-Capillary: 94 mg/dL (ref 70–99)

## 2020-03-14 LAB — LIPASE, BLOOD: Lipase: 27 U/L (ref 11–51)

## 2020-03-14 MED ORDER — ENOXAPARIN SODIUM 30 MG/0.3ML ~~LOC~~ SOLN
30.0000 mg | SUBCUTANEOUS | Status: DC
  Administered 2020-03-15 – 2020-03-16 (×2): 30 mg via SUBCUTANEOUS
  Filled 2020-03-14 (×2): qty 0.3

## 2020-03-14 MED ORDER — FLUTICASONE PROPIONATE 50 MCG/ACT NA SUSP
1.0000 | Freq: Every day | NASAL | Status: DC
  Administered 2020-03-15 – 2020-03-20 (×5): 1 via NASAL
  Filled 2020-03-14: qty 16

## 2020-03-14 MED ORDER — ONDANSETRON HCL 4 MG/2ML IJ SOLN: 4.0000 mg | Freq: Four times a day (QID) | INTRAMUSCULAR | Status: DC | PRN

## 2020-03-14 MED ORDER — KCL-LACTATED RINGERS-D5W 20 MEQ/L IV SOLN
INTRAVENOUS | Status: DC
  Filled 2020-03-14 (×15): qty 1000

## 2020-03-14 MED ORDER — MORPHINE SULFATE (PF) 2 MG/ML IV SOLN: 2.0000 mg | INTRAVENOUS | Status: DC | PRN

## 2020-03-14 MED ORDER — PIPERACILLIN-TAZOBACTAM 3.375 G IVPB 30 MIN
3.3750 g | Freq: Once | INTRAVENOUS | Status: AC
  Administered 2020-03-14: 3.375 g via INTRAVENOUS
  Filled 2020-03-14: qty 50

## 2020-03-14 MED ORDER — ONDANSETRON HCL 4 MG PO TABS: 4.0000 mg | ORAL_TABLET | Freq: Four times a day (QID) | ORAL | Status: DC | PRN

## 2020-03-14 MED ORDER — PIPERACILLIN-TAZOBACTAM 3.375 G IVPB
3.3750 g | Freq: Three times a day (TID) | INTRAVENOUS | Status: DC
  Administered 2020-03-15 (×2): 3.375 g via INTRAVENOUS
  Filled 2020-03-14 (×2): qty 50

## 2020-03-14 MED ORDER — SODIUM CHLORIDE 0.9 % IV BOLUS
1000.0000 mL | Freq: Once | INTRAVENOUS | Status: AC
  Administered 2020-03-14: 1000 mL via INTRAVENOUS

## 2020-03-14 NOTE — ED Triage Notes (Signed)
Patient BIB POV by daughter from home c/o possible UTI, weakness, slurred speech, hallucinations, and AMS.  Family first noticed yesterday.  Patietn lives alone but daughter stop by multiple times a day to check on patient.

## 2020-03-14 NOTE — H&P (Signed)
History and Physical   Cory Hansen RUE:[id number] DOB: [birth year] DOA: 03/14/2020  Referring MD/NP/PA: Dr. Stevie Kernykstra  PCP: [name], Elizabeth A, MD   Outpatient Specialists: None  Patient coming from: Home  Chief Complaint: Abdominal pain  HPI: Cory Hansen is a 86 y.o. male with medical history significant of osteoarthritis and kidney stones otherwise chronic low back pain and hyperlipidemia only who presents to the ER with abdominal pain dark urine weakness and some slurred speech since yesterday.  He lives alone at home but daughter has been checking in on him repeatedly.  She found him in the extubation today.  She was worried about his condition and brought him to the ER thinking he may have UTI.  Patient was seen and evaluated in the ER.  He has elevated liver function test with abdominal pain and right upper quadrant abdominal tenderness.  Also found to be in acute kidney injury hypokalemia.  Ultrasound of the abdomen confirmed sludge with gallbladder thickening consistent with possible acute cholecystitis.  Surgery consulted and recommends admission to medical service for presumed acute cholecystitis and surgery will follow.  Patient has some nausea but no vomiting.  Has not had any diarrhea.  Patient will be admitted therefore for further evaluation and treatment..  ED Course: Temperature 97.9 blood pressure 95/62 pulse 87 respiratory rate of 18 oxygen sat 178.  White count is 13.3 hemoglobin 13.2 and platelets 214.  Sodium 137 potassium 3.3 chloride 105.  CO2 of 18 BUN 33 creatinine 2.03 and calcium 9.0 glucose 106.  Viral screen including COVID-19 is negative ultrasound abdomen showed distended gallbladder negative Murphy sign but telltale sign of cholecystitis.  Ammonia is 47 AST 378 ALT 294 total bilirubin 4.0 with GFR of 29.  Alkaline phosphatase 196.  Head CT without contrast is negative.  Based on patient's finding surgery consulted and recommends admission for possible surgical  evaluation and treatment  Review of Systems: As per HPI otherwise 10 point review of systems negative.    Past Medical History:  Diagnosis Date  . Arthritis   . Kidney stones   . Shingles     Past Surgical History:  Procedure Laterality Date  . AMPUTATION Left 02/22/2014   Procedure: INCISION AND DRAINAGE LEFT FOREFOOT AMPUTATION FIRST RAY;  Surgeon: Toni ArthursJohn Hewitt, MD;  Location: [facility] OR;  Service: Orthopedics;  Laterality: Left;  . APPENDECTOMY    . EYE SURGERY Bilateral    cataract surgery w/ lens implant  . HERNIA REPAIR     inguinal hernia repair     reports that he has never smoked. He has never used smokeless tobacco. He reports current alcohol use. He reports that he does not use drugs.  No Known Allergies  History reviewed. No pertinent family history.   Prior to Admission medications   Medication Sig Start Date End Date Taking? Authorizing Provider  diphenoxylate-atropine (LOMOTIL) 2.5-0.025 MG tablet Take 1 tablet by mouth 4 (four) times daily as needed for diarrhea or loose stools. 10/31/17  Yes [name], Elizabeth A, MD  fluticasone [facility](FLONASE) 50 MCG/ACT nasal spray Place 1 spray into both nostrils daily.   Yes [provider]  glucosamine-chondroitin 500-400 MG tablet Take 1 tablet by mouth daily.    Yes [provider]  ibuprofen (ADVIL) 200 MG tablet Take 200-400 mg by mouth in the morning and at bedtime.   Yes [provider]  vitamin B-12 (CYANOCOBALAMIN) 1000 MCG tablet Take 1 tablet (1,000 mcg total) by mouth daily. Patient taking differently: Take  1,000 mcg by mouth every other day.  10/14/14  Yes [name], MD    Physical Exam: Vitals:   03/14/20 1712 03/14/20 1755 03/14/20 1932 03/14/20 2030  BP: 95/62 (!) 97/53 110/71 113/66  Pulse: 71 87 86 77  Resp: 18 18 16 15   Temp:      TempSrc:      SpO2: 98% 96% 100% 99%  Weight:      Height:          Constitutional: Acutely ill looking no distress Vitals:   03/14/20  1712 03/14/20 1755 03/14/20 1932 03/14/20 2030  BP: 95/62 (!) 97/53 110/71 113/66  Pulse: 71 87 86 77  Resp: 18 18 16 15   Temp:      TempSrc:      SpO2: 98% 96% 100% 99%  Weight:      Height:       Eyes: PERRL, lids and conjunctivae jaundiced ENMT: Mucous membranes are dry. Posterior pharynx clear of any exudate or lesions.Normal dentition.  Neck: normal, supple, no masses, no thyromegaly Respiratory: clear to auscultation bilaterally, no wheezing, no crackles. Normal respiratory effort. No accessory muscle use.  Cardiovascular: Regular rate and rhythm, no murmurs / rubs / gallops. No extremity edema. 2+ pedal pulses. No carotid bruits.  Abdomen: Mild epigastric to right upper quadrant tenderness, no masses palpated. No hepatosplenomegaly. Bowel sounds positive.  Musculoskeletal: no clubbing / cyanosis. No joint deformity upper and lower extremities. Good ROM, no contractures. Normal muscle tone.  Skin: no rashes, lesions, ulcers. No induration Neurologic: CN 2-12 grossly intact. Sensation intact, DTR normal. Strength 5/5 in all 4.  Psychiatric: Weak and slightly drowsy but fully awake alert and oriented mostly    Labs on Admission: I have personally reviewed following labs and imaging studies  CBC: Recent Labs  Lab 03/14/20 1616  WBC 13.3*  HGB 13.2  HCT 40.6  MCV 96.4  PLT 214   Basic Metabolic Panel: Recent Labs  Lab 03/14/20 1616  NA 137  K 3.3*  CL 105  CO2 18*  GLUCOSE 106*  BUN 33*  CREATININE 2.03*  CALCIUM 9.0   GFR: Estimated Creatinine Clearance: 22.2 mL/min (A) (by C-G formula based on SCr of 2.03 mg/dL (H)). Liver Function Tests: Recent Labs  Lab 03/14/20 1616  AST 378*  ALT 294*  ALKPHOS 196*  BILITOT 4.0*  PROT 6.9  ALBUMIN 4.0   No results for input(s): LIPASE, AMYLASE in the last 168 hours. No results for input(s): AMMONIA in the last 168 hours. Coagulation Profile: No results for input(s): INR, PROTIME in the last 168 hours. Cardiac  Enzymes: No results for input(s): CKTOTAL, CKMB, CKMBINDEX, TROPONINI in the last 168 hours. BNP (last 3 results) No results for input(s): PROBNP in the last 8760 hours. HbA1C: No results for input(s): HGBA1C in the last 72 hours. CBG: Recent Labs  Lab 03/14/20 1605  GLUCAP 94   Lipid Profile: No results for input(s): CHOL, HDL, LDLCALC, TRIG, CHOLHDL, LDLDIRECT in the last 72 hours. Thyroid Function Tests: No results for input(s): TSH, T4TOTAL, FREET4, T3FREE, THYROIDAB in the last 72 hours. Anemia Panel: No results for input(s): VITAMINB12, FOLATE, FERRITIN, TIBC, IRON, RETICCTPCT in the last 72 hours. Urine analysis:    Component Value Date/Time   COLORURINE YELLOW 10/31/2017 1407   APPEARANCEUR CLEAR 10/31/2017 1407   LABSPEC 1.025 10/31/2017 1407   PHURINE 5.0 10/31/2017 1407   GLUCOSEU NEGATIVE 10/31/2017 1407   HGBUR NEGATIVE 10/31/2017 1407   BILIRUBINUR NEGATIVE 10/31/2017 1407  KETONESUR NEGATIVE 10/31/2017 1407   UROBILINOGEN 0.2 10/31/2017 1407   NITRITE NEGATIVE 10/31/2017 1407   LEUKOCYTESUR NEGATIVE 10/31/2017 1407   Sepsis Labs: (procalcitonin:4,lacticidven:4) ) Recent Results (from the past 240 hour(s))  Group A Strep by PCR     Status: None   Collection Time: 03/08/20 10:04 AM   Specimen: Throat; Sterile Swab  Result Value Ref Range Status   Group A Strep by PCR NOT DETECTED NOT DETECTED Final    Comment: Performed at [facility], [address] [place], [place] [address]  Resp Panel by RT-PCR (Flu A&B, Covid) Nasopharyngeal Swab     Status: None   Collection Time: 03/08/20  1:17 PM   Specimen: Nasopharyngeal Swab; Nasopharyngeal(NP) swabs in vial transport medium  Result Value Ref Range Status   SARS Coronavirus 2 by RT PCR NEGATIVE NEGATIVE Final    Comment: (NOTE) SARS-CoV-2 target nucleic acids are NOT DETECTED.  The SARS-CoV-2 RNA is generally detectable in upper respiratory specimens during the acute phase of infection.  The lowest concentration of SARS-CoV-2 viral copies this assay can detect is 138 copies/mL. A negative result does not preclude SARS-Cov-2 infection and should not be used as the sole basis for treatment or other patient management decisions. A negative result may occur with  improper specimen collection/handling, submission of specimen other than nasopharyngeal swab, presence of viral mutation(s) within the areas targeted by this assay, and inadequate number of viral copies(<138 copies/mL). A negative result must be combined with clinical observations, patient history, and epidemiological information. The expected result is Negative.  Fact Sheet for Patients:  BloggerCourse.com  Fact Sheet for Healthcare Providers:  SeriousBroker.it  This test is no t yet approved or cleared by the [place] FDA and  has been authorized for detection and/or diagnosis of SARS-CoV-2 by FDA under an Emergency Use Authorization (EUA). This EUA will remain  in effect (meaning this test can be used) for the duration of the COVID-19 declaration under Section 564(b)(1) of the Act, 21 U.S.C.section 360bbb-3(b)(1), unless the authorization is terminated  or revoked sooner.       Influenza A by PCR NEGATIVE NEGATIVE Final   Influenza B by PCR NEGATIVE NEGATIVE Final    Comment: (NOTE) The Xpert Xpress SARS-CoV-2/FLU/RSV plus assay is intended as an aid in the diagnosis of influenza from Nasopharyngeal swab specimens and should not be used as a sole basis for treatment. Nasal washings and aspirates are unacceptable for Xpert Xpress SARS-CoV-2/FLU/RSV testing.  Fact Sheet for Patients: BloggerCourse.com  Fact Sheet for Healthcare Providers: SeriousBroker.it  This test is not yet approved or cleared by the [place] FDA and has been authorized for detection and/or diagnosis of SARS-CoV-2 by FDA  under an Emergency Use Authorization (EUA). This EUA will remain in effect (meaning this test can be used) for the duration of the COVID-19 declaration under Section 564(b)(1) of the Act, 21 U.S.C. section 360bbb-3(b)(1), unless the authorization is terminated or revoked.  Performed at [facility], [address] [place], [place] [address]   Resp Panel by RT-PCR (Flu A&B, Covid) Nasopharyngeal Swab     Status: None   Collection Time: 03/14/20  6:22 PM   Specimen: Nasopharyngeal Swab; Nasopharyngeal(NP) swabs in vial transport medium  Result Value Ref Range Status   SARS Coronavirus 2 by RT PCR NEGATIVE NEGATIVE Final    Comment: (NOTE) SARS-CoV-2 target nucleic acids are NOT DETECTED.  The SARS-CoV-2 RNA is generally detectable in upper respiratory specimens during the acute  phase of infection. The lowest concentration of SARS-CoV-2 viral copies this assay can detect is 138 copies/mL. A negative result does not preclude SARS-Cov-2 infection and should not be used as the sole basis for treatment or other patient management decisions. A negative result may occur with  improper specimen collection/handling, submission of specimen other than nasopharyngeal swab, presence of viral mutation(s) within the areas targeted by this assay, and inadequate number of viral copies(<138 copies/mL). A negative result must be combined with clinical observations, patient history, and epidemiological information. The expected result is Negative.  Fact Sheet for Patients:  BloggerCourse.com  Fact Sheet for Healthcare Providers:  SeriousBroker.it  This test is no t yet approved or cleared by the [place] FDA and  has been authorized for detection and/or diagnosis of SARS-CoV-2 by FDA under an Emergency Use Authorization (EUA). This EUA will remain  in effect (meaning this test can be used) for the duration of the COVID-19 declaration  under Section 564(b)(1) of the Act, 21 U.S.C.section 360bbb-3(b)(1), unless the authorization is terminated  or revoked sooner.       Influenza A by PCR NEGATIVE NEGATIVE Final   Influenza B by PCR NEGATIVE NEGATIVE Final    Comment: (NOTE) The Xpert Xpress SARS-CoV-2/FLU/RSV plus assay is intended as an aid in the diagnosis of influenza from Nasopharyngeal swab specimens and should not be used as a sole basis for treatment. Nasal washings and aspirates are unacceptable for Xpert Xpress SARS-CoV-2/FLU/RSV testing.  Fact Sheet for Patients: BloggerCourse.com  Fact Sheet for Healthcare Providers: SeriousBroker.it  This test is not yet approved or cleared by the [place] FDA and has been authorized for detection and/or diagnosis of SARS-CoV-2 by FDA under an Emergency Use Authorization (EUA). This EUA will remain in effect (meaning this test can be used) for the duration of the COVID-19 declaration under Section 564(b)(1) of the Act, 21 U.S.C. section 360bbb-3(b)(1), unless the authorization is terminated or revoked.  Performed at [facility], [address], [place], [place] [address]      Radiological Exams on Admission: CT Head Wo Contrast  Result Date: 03/14/2020 CLINICAL DATA:  Delirium, weakness, slurred speech, hallucinations, altered mental status, possible UTI EXAM: CT HEAD WITHOUT CONTRAST TECHNIQUE: Contiguous axial images were obtained from the base of the skull through the vertex without intravenous contrast. Sagittal and coronal MPR images reconstructed from axial data set. COMPARISON:  None FINDINGS: Brain: Generalized atrophy. Normal ventricular morphology. No midline shift or mass effect. Small vessel chronic ischemic changes of deep cerebral white matter. No intracranial hemorrhage, mass lesion, evidence of acute infarction, or extra-axial fluid collection. Vascular: Atherosclerotic  calcification of internal carotid and vertebral arteries at skull base Skull: Demineralized but intact Sinuses/Orbits: Clear Other: N/A IMPRESSION: Atrophy with small vessel chronic ischemic changes of deep cerebral white matter. No acute intracranial abnormalities. Electronically Signed   By: Ulyses Southward M.D.   On: 03/14/2020 18:55   US Abdomen Limited  Result Date: 03/14/2020 CLINICAL DATA:  Transaminitis EXAM: ULTRASOUND ABDOMEN LIMITED RIGHT UPPER QUADRANT COMPARISON:  None. FINDINGS: Gallbladder: There is gallbladder sludge with mild gallbladder wall thickening. The gallbladder wall measures up to approximately 3.4 mm. The sonographic Eulah Pont sign is negative. The gallbladder is distended. Common bile duct: Diameter: 7 mm. Liver: No focal lesion identified. Within normal limits in parenchymal echogenicity. Portal vein is patent on color Doppler imaging with normal direction of blood flow towards the liver. Other: None. IMPRESSION: 1. Distended gallbladder with gallbladder sludge and mild gallbladder  wall thickening. However, the sonographic Eulah Pont sign is reported as negative. As such, findings are equivocal for acute cholecystitis. Consider HIDA scan for further evaluation as clinically indicated. 2. The common bile duct measures at the upper limits of normal. Electronically Signed   By: Katherine Mantle M.D.   On: 03/14/2020 18:50      Assessment/Plan Principal Problem:   Acute cholecystitis Active Problems:   Hyperlipidemia   Hx of benign neoplasm of prostate   AKI (acute kidney injury) (HCC)   Hypokalemia     #1 acute cholecystitis with cholelithiasis: Based on patient's clinical presentation and ultrasound findings.  Also elevated LFT consistent with obstructive jaundice.  At this point will admit the patient to medical floor.  Initiate IV Zosyn.  Pain control and nausea control.  Surgery to see patient in the morning and decide on possible surgery.  Patient is 30 but remarkably  healthy.  Medically he is seem to be healthy enough to undergo any surgery.  #2 weakness with slurred speech: Most likely secondary to #1.  Head CT without contrast negative.  No focal weakness.  #3 AKI: Most likely prerenal.  Aggressively hydrate and monitor  #3 hypokalemia: Replete potassium  #5 hyperlipidemia: Hold hold any treatment for now.  Patient will be possibly n.p.o. for now.   DVT prophylaxis: Heparin Code Status: Full code Family Communication: Daughter Disposition Plan: Home Consults called: Dr. Magnus Ivan: General surgery Admission status: Inpatient  Severity of Illness: The appropriate patient status for this patient is INPATIENT. Inpatient status is judged to be reasonable and necessary in order to provide the required intensity of service to ensure the patient's safety. The patient's presenting symptoms, physical exam findings, and initial radiographic and laboratory data in the context of their chronic comorbidities is felt to place them at high risk for further clinical deterioration. Furthermore, it is not anticipated that the patient will be medically stable for discharge from the hospital within 2 midnights of admission. The following factors support the patient status of inpatient.   " The patient's presenting symptoms include Abdominal pain with weakness and slurred speech. " The worrisome physical exam findings include right upper quadrant abdominal tenderness. " The initial radiographic and laboratory data are worrisome because of elevated LFTs with evidence of cholelithiasis and cholecystitis. " The chronic co-morbidities include hyperlipidemia.   * I certify that at the point of admission it is my clinical judgment that the patient will require inpatient hospital care spanning beyond 2 midnights from the point of admission due to high intensity of service, high risk for further deterioration and high frequency of surveillance required.Lonia Blood  MD [facility] Pager [phone]  If 7PM-7AM, please contact night-coverage [email or web address] Password [facility]  03/14/2020, 9:14 PM

## 2020-03-14 NOTE — Progress Notes (Signed)
Pharmacy Antibiotic Note  Cory Hansen is a 86 y.o. male presented to the ED on 03/14/2020 with AMS, slurred speech and suspected UTI.  Abd US showed findings with concern for acute cholecystitis.  Pharmacy has been consulted for zosyn dosing.  Plan: - zosyn 3.375 gm IV x1 over 30 min, then 3.375 gm IV q8h (infuse over 4 hrs) - scr in AM - monitor renal function closely  _______________________________  Height: 5\' 11"  (180.3 cm) Weight: 81.6 kg (180 lb) IBW/kg (Calculated) : 75.3  Temp (24hrs), Avg:97.9 F (36.6 C), Min:97.9 F (36.6 C), Max:97.9 F (36.6 C)  Recent Labs  Lab 03/14/20 1616  WBC 13.3*  CREATININE 2.03*    Estimated Creatinine Clearance: 22.2 mL/min (A) (by C-G formula based on SCr of 2.03 mg/dL (H)).    No Known Allergies   Thank you for allowing pharmacy to be a part of this patient's care.  14/07/21 03/14/2020 9:17 PM

## 2020-03-14 NOTE — Telephone Encounter (Signed)
Patients daughter called and said that the patients urine is almost is as dark as coffee. The patient is confused and he is having trouble standing. Transferred to [facility].

## 2020-03-14 NOTE — Telephone Encounter (Signed)
[facility]  Caller states her father has urine as dark as coffee.He is confused and having trouble standing. Voice does not sound normal and he is seeing things in his room. His S&S are worsening. He cannot stand at all now. no temp  [facility] advised: Call EMS 911 Now  Patient understood but disagreed.   Patient went to ED 03/14/20

## 2020-03-14 NOTE — ED Notes (Signed)
ED TO INPATIENT HANDOFF REPORT  Name/Age/Gender Cory Hansen 86 y.o. male  Code Status Code Status History    Date Active Date Inactive Code Status Order ID Comments User Context   02/22/2014 1751 02/25/2014 1725 Full Code [id number]  Toni Arthurs, MD Inpatient   02/11/2014 1232 02/14/2014 1837 Full Code [id number]  Abram Sander, MD Inpatient   Advance Care Planning Activity    Questions for Most Recent Historical Code Status (Order [id number])       Home/SNF/Other Home  Chief Complaint Acute cholecystitis [K81.0]  Level of Care/Admitting Diagnosis ED Disposition    ED Disposition Condition Comment   Admit  Hospital Area: [facility] [[id number]]  Level of Care: Med-Surg [16]  May admit patient to [facility] or [facility] if equivalent level of care is available:: No  Covid Evaluation: Asymptomatic Screening Protocol (No Symptoms)  Diagnosis: Acute cholecystitis [575.0.ICD-9-CM]  Admitting Physician: Rometta Emery [[id number]]  Attending Physician: Rometta Emery [[id number]]  Estimated length of stay: past midnight tomorrow  Certification:: I certify this patient will need inpatient services for at least 2 midnights       Medical History Past Medical History:  Diagnosis Date  . Arthritis   . Kidney stones   . Shingles     Allergies No Known Allergies  IV Location/Drains/Wounds Patient Lines/Drains/Airways Status    Active Line/Drains/Airways    Name Placement date Placement time Site Days   Peripheral IV 03/14/20 Anterior;Distal;Left;Upper Arm 03/14/20  2058  Arm  less than 1   PICC / Midline Single Lumen 02/23/14 PICC Right Basilic 41 cm 1 cm 02/23/14  9509  Basilic  2211   Incision (Closed) 02/22/14 Foot Left 02/22/14  1537   2212          Labs/Imaging Results for orders placed or performed during the hospital encounter of 03/14/20 (from the past 48 hour(s))  CBG monitoring, ED     Status: None   Collection Time: 03/14/20  4:05 PM  Result  Value Ref Range   Glucose-Capillary 94 70 - 99 mg/dL    Comment: Glucose reference range applies only to samples taken after fasting for at least 8 hours.  Comprehensive metabolic panel     Status: Abnormal   Collection Time: 03/14/20  4:16 PM  Result Value Ref Range   Sodium 137 135 - 145 mmol/L   Potassium 3.3 (L) 3.5 - 5.1 mmol/L   Chloride 105 98 - 111 mmol/L   CO2 18 (L) 22 - 32 mmol/L   Glucose, Bld 106 (H) 70 - 99 mg/dL    Comment: Glucose reference range applies only to samples taken after fasting for at least 8 hours.   BUN 33 (H) 8 - 23 mg/dL   Creatinine, Ser 3.26 (H) 0.61 - 1.24 mg/dL   Calcium 9.0 8.9 - 71.2 mg/dL   Total Protein 6.9 6.5 - 8.1 g/dL   Albumin 4.0 3.5 - 5.0 g/dL   AST 458 (H) 15 - 41 U/L   ALT 294 (H) 0 - 44 U/L   Alkaline Phosphatase 196 (H) 38 - 126 U/L   Total Bilirubin 4.0 (H) 0.3 - 1.2 mg/dL   GFR, Estimated 29 (L) >60 mL/min    Comment: (NOTE) Calculated using the CKD-EPI Creatinine Equation (2021)    Anion gap 14 5 - 15    Comment: Performed at [facility], [address], [place], [place] [address]  CBC     Status: Abnormal  Collection Time: 03/14/20  4:16 PM  Result Value Ref Range   WBC 13.3 (H) 4.0 - 10.5 K/uL   RBC 4.21 (L) 4.22 - 5.81 MIL/uL   Hemoglobin 13.2 13.0 - 17.0 g/dL   HCT 50.2 39 - 52 %   MCV 96.4 80.0 - 100.0 fL   MCH 31.4 26.0 - 34.0 pg   MCHC 32.5 30.0 - 36.0 g/dL   RDW 77.4 12.8 - 78.6 %   Platelets 214 150 - 400 K/uL   nRBC 0.0 0.0 - 0.2 %    Comment: Performed at [facility], [address] [place], [place] [address]  Resp Panel by RT-PCR (Flu A&B, Covid) Nasopharyngeal Swab     Status: None   Collection Time: 03/14/20  6:22 PM   Specimen: Nasopharyngeal Swab; Nasopharyngeal(NP) swabs in vial transport medium  Result Value Ref Range   SARS Coronavirus 2 by RT PCR NEGATIVE NEGATIVE    Comment: (NOTE) SARS-CoV-2 target nucleic acids are NOT DETECTED.  The SARS-CoV-2 RNA is  generally detectable in upper respiratory specimens during the acute phase of infection. The lowest concentration of SARS-CoV-2 viral copies this assay can detect is 138 copies/mL. A negative result does not preclude SARS-Cov-2 infection and should not be used as the sole basis for treatment or other patient management decisions. A negative result may occur with  improper specimen collection/handling, submission of specimen other than nasopharyngeal swab, presence of viral mutation(s) within the areas targeted by this assay, and inadequate number of viral copies(<138 copies/mL). A negative result must be combined with clinical observations, patient history, and epidemiological information. The expected result is Negative.  Fact Sheet for Patients:  BloggerCourse.com  Fact Sheet for Healthcare Providers:  SeriousBroker.it  This test is no t yet approved or cleared by the [place] FDA and  has been authorized for detection and/or diagnosis of SARS-CoV-2 by FDA under an Emergency Use Authorization (EUA). This EUA will remain  in effect (meaning this test can be used) for the duration of the COVID-19 declaration under Section 564(b)(1) of the Act, 21 U.S.C.section 360bbb-3(b)(1), unless the authorization is terminated  or revoked sooner.       Influenza A by PCR NEGATIVE NEGATIVE   Influenza B by PCR NEGATIVE NEGATIVE    Comment: (NOTE) The Xpert Xpress SARS-CoV-2/FLU/RSV plus assay is intended as an aid in the diagnosis of influenza from Nasopharyngeal swab specimens and should not be used as a sole basis for treatment. Nasal washings and aspirates are unacceptable for Xpert Xpress SARS-CoV-2/FLU/RSV testing.  Fact Sheet for Patients: BloggerCourse.com  Fact Sheet for Healthcare Providers: SeriousBroker.it  This test is not yet approved or cleared by the [place] FDA  and has been authorized for detection and/or diagnosis of SARS-CoV-2 by FDA under an Emergency Use Authorization (EUA). This EUA will remain in effect (meaning this test can be used) for the duration of the COVID-19 declaration under Section 564(b)(1) of the Act, 21 U.S.C. section 360bbb-3(b)(1), unless the authorization is terminated or revoked.  Performed at [facility], [address], [place], [place] [address]   CK     Status: Abnormal   Collection Time: 03/14/20  9:00 PM  Result Value Ref Range   Total CK 5,854 (H) 49.0 - 397.0 U/L    Comment: RESULTS CONFIRMED BY MANUAL DILUTION Performed at [facility], [address] [place], [place] [address]   Lipase, blood     Status: None   Collection Time:  03/14/20  9:00 PM  Result Value Ref Range   Lipase 27 11 - 51 U/L    Comment: Performed at [facility], [address] [place], [place] [address]  Ammonia     Status: Abnormal   Collection Time: 03/14/20  9:00 PM  Result Value Ref Range   Ammonia 47 (H) 9 - 35 umol/L    Comment: Performed at [facility], [address], [place], [place] [address]   CT Head Wo Contrast  Result Date: 03/14/2020 CLINICAL DATA:  Delirium, weakness, slurred speech, hallucinations, altered mental status, possible UTI EXAM: CT HEAD WITHOUT CONTRAST TECHNIQUE: Contiguous axial images were obtained from the base of the skull through the vertex without intravenous contrast. Sagittal and coronal MPR images reconstructed from axial data set. COMPARISON:  None FINDINGS: Brain: Generalized atrophy. Normal ventricular morphology. No midline shift or mass effect. Small vessel chronic ischemic changes of deep cerebral white matter. No intracranial hemorrhage, mass lesion, evidence of acute infarction, or extra-axial fluid collection. Vascular: Atherosclerotic calcification of internal carotid and vertebral arteries at skull base Skull:  Demineralized but intact Sinuses/Orbits: Clear Other: N/A IMPRESSION: Atrophy with small vessel chronic ischemic changes of deep cerebral white matter. No acute intracranial abnormalities. Electronically Signed   By: Ulyses Southward M.D.   On: 03/14/2020 18:55   US Abdomen Limited  Result Date: 03/14/2020 CLINICAL DATA:  Transaminitis EXAM: ULTRASOUND ABDOMEN LIMITED RIGHT UPPER QUADRANT COMPARISON:  None. FINDINGS: Gallbladder: There is gallbladder sludge with mild gallbladder wall thickening. The gallbladder wall measures up to approximately 3.4 mm. The sonographic Eulah Pont sign is negative. The gallbladder is distended. Common bile duct: Diameter: 7 mm. Liver: No focal lesion identified. Within normal limits in parenchymal echogenicity. Portal vein is patent on color Doppler imaging with normal direction of blood flow towards the liver. Other: None. IMPRESSION: 1. Distended gallbladder with gallbladder sludge and mild gallbladder wall thickening. However, the sonographic Eulah Pont sign is reported as negative. As such, findings are equivocal for acute cholecystitis. Consider HIDA scan for further evaluation as clinically indicated. 2. The common bile duct measures at the upper limits of normal. Electronically Signed   By: Katherine Mantle M.D.   On: 03/14/2020 18:50    Pending Labs Unresulted Labs (From admission, onward)          Start     Ordered   03/15/20 0500  Creatinine, serum  Tomorrow morning,   R        03/14/20 2121   03/14/20 1601  Urinalysis, Routine w reflex microscopic  Once,   STAT        03/14/20 1600   Signed and Held  CBC  (enoxaparin (LOVENOX)    CrCl >/= 30 ml/min)  Once,   R       Comments: Baseline for enoxaparin therapy IF NOT ALREADY DRAWN.  Notify MD if PLT < 100 K.    Signed and Held   Signed and Held  Creatinine, serum  (enoxaparin (LOVENOX)    CrCl >/= 30 ml/min)  Once,   R       Comments: Baseline for enoxaparin therapy IF NOT ALREADY DRAWN.    Signed and Held    Signed and Held  Creatinine, serum  (enoxaparin (LOVENOX)    CrCl >/= 30 ml/min)  Weekly,   R     Comments: while on enoxaparin therapy    Signed and Held   Signed and Held  Comprehensive metabolic panel  Tomorrow morning,  R        Signed and Held   Signed and Held  CBC  Tomorrow morning,   R        Signed and Held          Vitals/Pain Today's Vitals   03/14/20 1755 03/14/20 1932 03/14/20 2030 03/14/20 2200  BP: (!) 97/53 110/71 113/66 115/75  Pulse: 87 86 77 82  Resp: 18 16 15 14   Temp:      TempSrc:      SpO2: 96% 100% 99% 100%  Weight:      Height:      PainSc:        Isolation Precautions No active isolations  Medications Medications  piperacillin-tazobactam (ZOSYN) IVPB 3.375 g (has no administration in time range)  sodium chloride 0.9 % bolus 1,000 mL (1,000 mLs Intravenous New Bag/Given 03/14/20 2059)  piperacillin-tazobactam (ZOSYN) IVPB 3.375 g (0 g Intravenous Stopped 03/14/20 2200)    Mobility walks with person assist

## 2020-03-14 NOTE — ED Provider Notes (Signed)
[facility]-EMERGENCY DEPT Provider Note   CSN: [id number] Arrival date & time: 03/14/20  1544     History Chief Complaint  Patient presents with  . Altered Mental Status  . Hallucinations  . Possible UTI  . Fatigue  . Aphasia    Cory Hansen is a 86 y.o. male.  Presents to ER with concern for generalized confusion, weakness over the past couple days.  Family noted urine to be dark.  No complaints of chest pain or back pain or abdominal pain, no vomiting, no known fevers.  Patient answers basic questions appropriately, no numbness or vision changes.  Endorses chronic vision problems.  HPI     Past Medical History:  Diagnosis Date  . Arthritis   . Kidney stones   . Shingles     Patient Active Problem List   Diagnosis Date Noted  . Acute cholecystitis 03/14/2020  . AKI (acute kidney injury) (HCC) 03/14/2020  . Hypokalemia 03/14/2020  . SOB (shortness of breath) 08/19/2019  . Acute right ankle pain 05/07/2019  . Diarrhea 10/27/2015  . Urinary dribbling 07/26/2015  . Routine general medical examination at a health care facility 10/13/2014  . CT, CHEST, ABNORMAL 01/23/2009  . Chronic bilateral low back pain with right-sided sciatica 01/17/2009  . NEOPLASM, MALIGNANT, BLADDER, HX OF 01/17/2009  . Hx of benign neoplasm of prostate 01/17/2009  . Hyperlipidemia 01/16/2009  . ARTHRITIS 01/16/2009  . Weakness 01/16/2009    Past Surgical History:  Procedure Laterality Date  . AMPUTATION Left 02/22/2014   Procedure: INCISION AND DRAINAGE LEFT FOREFOOT AMPUTATION FIRST RAY;  Surgeon: Toni Arthurs, MD;  Location: [facility] OR;  Service: Orthopedics;  Laterality: Left;  . APPENDECTOMY    . EYE SURGERY Bilateral    cataract surgery w/ lens implant  . HERNIA REPAIR     inguinal hernia repair       History reviewed. No pertinent family history.  Social History   Tobacco Use  . Smoking status: Never Smoker  . Smokeless tobacco: Never Used  Substance Use  Topics  . Alcohol use: Yes    Alcohol/week: 0.0 standard drinks    Comment: very rare  . Drug use: No    Home Medications Prior to Admission medications   Medication Sig Start Date End Date Taking? Authorizing Provider  diphenoxylate-atropine (LOMOTIL) 2.5-0.025 MG tablet Take 1 tablet by mouth 4 (four) times daily as needed for diarrhea or loose stools. 10/31/17  Yes [name], MD  fluticasone [facility]) 50 MCG/ACT nasal spray Place 1 spray into both nostrils daily.   Yes [provider]  glucosamine-chondroitin 500-400 MG tablet Take 1 tablet by mouth daily.    Yes [provider]  ibuprofen (ADVIL) 200 MG tablet Take 200-400 mg by mouth in the morning and at bedtime.   Yes [provider]  vitamin B-12 (CYANOCOBALAMIN) 1000 MCG tablet Take 1 tablet (1,000 mcg total) by mouth daily. Patient taking differently: Take 1,000 mcg by mouth every other day.  10/14/14  Yes [name], MD    Allergies    Patient has no known allergies.  Review of Systems   Review of Systems  Constitutional: Positive for fatigue. Negative for chills and fever.  HENT: Negative for ear pain and sore throat.   Eyes: Negative for pain and visual disturbance.  Respiratory: Negative for cough and shortness of breath.   Cardiovascular: Negative for chest pain and palpitations.  Gastrointestinal: Negative for abdominal pain and vomiting.  Genitourinary: Negative  for dysuria and hematuria.  Musculoskeletal: Negative for arthralgias and back pain.  Skin: Negative for color change and rash.  Neurological: Positive for weakness. Negative for seizures and syncope.  All other systems reviewed and are negative.   Physical Exam Updated Vital Signs BP 119/78 (BP Location: Right Arm)   Pulse 83   Temp 98.5 F (36.9 C) (Oral)   Resp 18   Ht 5\' 11"  (1.803 m)   Wt 81.6 kg   SpO2 93%   BMI 25.10 kg/m   Physical Exam Vitals and nursing note reviewed.  Constitutional:       Appearance: He is well-developed.     Comments: elderly  HENT:     Head: Normocephalic and atraumatic.  Eyes:     Conjunctiva/sclera: Conjunctivae normal.  Cardiovascular:     Rate and Rhythm: Normal rate and regular rhythm.     Heart sounds: No murmur heard.   Pulmonary:     Effort: Pulmonary effort is normal. No respiratory distress.     Breath sounds: Normal breath sounds.  Abdominal:     Palpations: Abdomen is soft.     Tenderness: There is no abdominal tenderness.  Musculoskeletal:     Cervical back: Neck supple.  Skin:    General: Skin is warm and dry.  Neurological:     Mental Status: He is alert.     Comments: Mild decreased strength in all four extremities, sensation to light touch intact in all four extremities, speech clear, no aphasia     ED Results / Procedures / Treatments   Labs (all labs ordered are listed, but only abnormal results are displayed) Labs Reviewed  COMPREHENSIVE METABOLIC PANEL - Abnormal; Notable for the following components:      Result Value   Potassium 3.3 (*)    CO2 18 (*)    Glucose, Bld 106 (*)    BUN 33 (*)    Creatinine, Ser 2.03 (*)    AST 378 (*)    ALT 294 (*)    Alkaline Phosphatase 196 (*)    Total Bilirubin 4.0 (*)    GFR, Estimated 29 (*)    All other components within normal limits  CBC - Abnormal; Notable for the following components:   WBC 13.3 (*)    RBC 4.21 (*)    All other components within normal limits  CK - Abnormal; Notable for the following components:   Total CK 5,854 (*)    All other components within normal limits  AMMONIA - Abnormal; Notable for the following components:   Ammonia 47 (*)    All other components within normal limits  RESP PANEL BY RT-PCR (FLU A&B, COVID) ARPGX2  LIPASE, BLOOD  URINALYSIS, ROUTINE W REFLEX MICROSCOPIC  CBC  CREATININE, SERUM  COMPREHENSIVE METABOLIC PANEL  CBC  CBG MONITORING, ED    EKG None  Radiology CT Head Wo Contrast  Result Date:  03/14/2020 CLINICAL DATA:  Delirium, weakness, slurred speech, hallucinations, altered mental status, possible UTI EXAM: CT HEAD WITHOUT CONTRAST TECHNIQUE: Contiguous axial images were obtained from the base of the skull through the vertex without intravenous contrast. Sagittal and coronal MPR images reconstructed from axial data set. COMPARISON:  None FINDINGS: Brain: Generalized atrophy. Normal ventricular morphology. No midline shift or mass effect. Small vessel chronic ischemic changes of deep cerebral white matter. No intracranial hemorrhage, mass lesion, evidence of acute infarction, or extra-axial fluid collection. Vascular: Atherosclerotic calcification of internal carotid and vertebral arteries at skull base Skull: Demineralized but  intact Sinuses/Orbits: Clear Other: N/A IMPRESSION: Atrophy with small vessel chronic ischemic changes of deep cerebral white matter. No acute intracranial abnormalities. Electronically Signed   By: Ulyses Southward M.D.   On: 03/14/2020 18:55   US Abdomen Limited  Result Date: 03/14/2020 CLINICAL DATA:  Transaminitis EXAM: ULTRASOUND ABDOMEN LIMITED RIGHT UPPER QUADRANT COMPARISON:  None. FINDINGS: Gallbladder: There is gallbladder sludge with mild gallbladder wall thickening. The gallbladder wall measures up to approximately 3.4 mm. The sonographic Eulah Pont sign is negative. The gallbladder is distended. Common bile duct: Diameter: 7 mm. Liver: No focal lesion identified. Within normal limits in parenchymal echogenicity. Portal vein is patent on color Doppler imaging with normal direction of blood flow towards the liver. Other: None. IMPRESSION: 1. Distended gallbladder with gallbladder sludge and mild gallbladder wall thickening. However, the sonographic Eulah Pont sign is reported as negative. As such, findings are equivocal for acute cholecystitis. Consider HIDA scan for further evaluation as clinically indicated. 2. The common bile duct measures at the upper limits of normal.  Electronically Signed   By: Katherine Mantle M.D.   On: 03/14/2020 18:50    Procedures Procedures (including critical care time)  Medications Ordered in ED Medications  fluticasone (FLONASE) 50 MCG/ACT nasal spray 1 spray (has no administration in time range)  enoxaparin (LOVENOX) injection 30 mg (has no administration in time range)  morphine 2 MG/ML injection 2 mg (has no administration in time range)  ondansetron (ZOFRAN) tablet 4 mg (has no administration in time range)    Or  ondansetron (ZOFRAN) injection 4 mg (has no administration in time range)  dextrose 5% in lactated ringers with KCl 20 mEq/L infusion (has no administration in time range)  piperacillin-tazobactam (ZOSYN) IVPB 3.375 g (has no administration in time range)  sodium chloride 0.9 % bolus 1,000 mL (0 mLs Intravenous Stopped 03/14/20 2253)  piperacillin-tazobactam (ZOSYN) IVPB 3.375 g (0 g Intravenous Stopped 03/14/20 2200)    ED Course  I have reviewed the triage vital signs and the nursing notes.  Pertinent labs & imaging results that were available during my care of the patient were reviewed by me and considered in my medical decision making (see chart for details).    MDM Rules/Calculators/A&P                           86 year old male otherwise fairly healthy presents to ER with concern for generalized weakness, fatigue and dark urine. CT head neg, weakness is generalized and no focal neuro findings, suspect less likely stroke. Lab work notable for leukocytosis, AKI, transaminitis including elevated T bili.  No abdominal pain or abdominal tenderness on exam.  Right upper quadrant ultrasound demonstrating distended gallbladder with sludge and mild wall thickening however sonographic Murphy's negative.  Reviewed findings with [facility] on-call for general surgery, he recommends medicine admission, abx for now, someone from their team can see tomorrow. Started zosyn to cover for possible cholecystitis. Provided  fluids for dehydration. Will admit to medicine for further management of AKI, transaminitis and pos cholecystitis. Will defer further imaging decision (HIDA or otherwise) to admitting and gen surg.    Final Clinical Impression(s) / ED Diagnoses Final diagnoses:  AKI (acute kidney injury) (HCC)  Transaminitis  Cholecystitis, acute    Rx / DC Orders ED Discharge Orders    None       Milagros Loll, MD 03/14/20 2345

## 2020-03-14 NOTE — Progress Notes (Signed)
A consult was received from an ED physician for zosyn per pharmacy dosing.  The patient's profile has been reviewed for ht/wt/allergies/indication/available labs.    A one time order has been placed for zosyn 3.375 gm IV x1 over 30 min.  Further antibiotics/pharmacy consults should be ordered by admitting physician if indicated.                       Thank you, Lucia Gaskins 03/14/2020  8:50 PM

## 2020-03-15 DIAGNOSIS — E7800 Pure hypercholesterolemia, unspecified: Secondary | ICD-10-CM

## 2020-03-15 DIAGNOSIS — E876 Hypokalemia: Secondary | ICD-10-CM

## 2020-03-15 DIAGNOSIS — R7401 Elevation of levels of liver transaminase levels: Secondary | ICD-10-CM

## 2020-03-15 DIAGNOSIS — Z87438 Personal history of other diseases of male genital organs: Secondary | ICD-10-CM

## 2020-03-15 DIAGNOSIS — N179 Acute kidney failure, unspecified: Secondary | ICD-10-CM

## 2020-03-15 LAB — COMPREHENSIVE METABOLIC PANEL
ALT: 208 U/L — ABNORMAL HIGH (ref 0–44)
AST: 203 U/L — ABNORMAL HIGH (ref 15–41)
Albumin: 3.1 g/dL — ABNORMAL LOW (ref 3.5–5.0)
Alkaline Phosphatase: 155 U/L — ABNORMAL HIGH (ref 38–126)
Anion gap: 10 (ref 5–15)
BUN: 29 mg/dL — ABNORMAL HIGH (ref 8–23)
CO2: 19 mmol/L — ABNORMAL LOW (ref 22–32)
Calcium: 8.4 mg/dL — ABNORMAL LOW (ref 8.9–10.3)
Chloride: 109 mmol/L (ref 98–111)
Creatinine, Ser: 1.39 mg/dL — ABNORMAL HIGH (ref 0.61–1.24)
GFR, Estimated: 46 mL/min — ABNORMAL LOW (ref >60)
Glucose, Bld: 118 mg/dL — ABNORMAL HIGH (ref 70–99)
Potassium: 3.2 mmol/L — ABNORMAL LOW (ref 3.5–5.1)
Sodium: 138 mmol/L (ref 135–145)
Total Bilirubin: 2.6 mg/dL — ABNORMAL HIGH (ref 0.3–1.2)
Total Protein: 5.5 g/dL — ABNORMAL LOW (ref 6.5–8.1)

## 2020-03-15 LAB — BILIRUBIN, DIRECT: Bilirubin, Direct: 1.4 mg/dL — ABNORMAL HIGH (ref 0.0–0.2)

## 2020-03-15 LAB — CBC
HCT: 37.2 % — ABNORMAL LOW (ref 39.0–52.0)
Hemoglobin: 12.1 g/dL — ABNORMAL LOW (ref 13.0–17.0)
MCH: 30.6 pg (ref 26.0–34.0)
MCHC: 32.5 g/dL (ref 30.0–36.0)
MCV: 94.2 fL (ref 80.0–100.0)
Platelets: 177 10*3/uL (ref 150–400)
RBC: 3.95 MIL/uL — ABNORMAL LOW (ref 4.22–5.81)
RDW: 13.5 % (ref 11.5–15.5)
WBC: 7.9 10*3/uL (ref 4.0–10.5)
nRBC: 0 % (ref 0.0–0.2)

## 2020-03-15 LAB — URINALYSIS, ROUTINE W REFLEX MICROSCOPIC
Bilirubin Urine: NEGATIVE
Glucose, UA: NEGATIVE mg/dL
Ketones, ur: NEGATIVE mg/dL
Leukocytes,Ua: NEGATIVE
Nitrite: NEGATIVE
Protein, ur: 30 mg/dL — AB
Specific Gravity, Urine: 1.014 (ref 1.005–1.030)
pH: 5 (ref 5.0–8.0)

## 2020-03-15 LAB — PROCALCITONIN: Procalcitonin: 3.63 ng/mL

## 2020-03-15 LAB — POTASSIUM: Potassium: 3.9 mmol/L (ref 3.5–5.1)

## 2020-03-15 LAB — MAGNESIUM: Magnesium: 1.9 mg/dL (ref 1.7–2.4)

## 2020-03-15 LAB — PHOSPHORUS: Phosphorus: 2.9 mg/dL (ref 2.5–4.6)

## 2020-03-15 LAB — GLUCOSE, CAPILLARY: Glucose-Capillary: 114 mg/dL — ABNORMAL HIGH (ref 70–99)

## 2020-03-15 MED ORDER — POTASSIUM CHLORIDE 10 MEQ/100ML IV SOLN
10.0000 meq | INTRAVENOUS | Status: AC
  Administered 2020-03-15 (×4): 10 meq via INTRAVENOUS
  Filled 2020-03-15 (×3): qty 100

## 2020-03-15 NOTE — Plan of Care (Signed)

## 2020-03-15 NOTE — Consult Note (Signed)
Consult Note  Cory Hansen 03-20-1921  [id number].    Requesting MD: [name] Chief Complaint/Reason for Consult: Possible cholecystitis  HPI:  Patient is a 86 year old male who presented to [facility] with complaints of generalized weakness and fatigue and darkened urine. Patient is resting this AM but awakens easily and is A&Ox3 and able to answer questions appropriately. He denies abdominal pain or nausea/vomiting. He denies fever, chills, chest pain SOB. I spoke with his daughter, Cory Hansen, on the phone who reports that he hasn't complained of abdominal pain but has not been wanting to eat as much for the last 2 weeks. He was very confused yesterday AM when she went to check on him and so she brought him to the ED.   PMH otherwise significant for arthritis, vision problems, HLD, and hx of nephrolithiasis. Past abdominal surgery includes open appendectomy and inguinal hernia repair. Patient reports NKDA. He is not on any blood thinning medications. Patient reports rare alcohol use and denies tobacco and drug use. He lives alone but his family checks on him daily and he gets around using a walker.   ROS: Review of Systems  Constitutional: Positive for malaise/fatigue. Negative for chills and fever.  Respiratory: Negative for shortness of breath and wheezing.   Cardiovascular: Negative for chest pain and palpitations.  Gastrointestinal: Negative for abdominal pain, constipation, diarrhea, nausea and vomiting.  Genitourinary: Negative for dysuria, frequency and urgency.       Darkened urine  All other systems reviewed and are negative.   History reviewed. No pertinent family history.  Past Medical History:  Diagnosis Date  . Arthritis   . Kidney stones   . Shingles     Past Surgical History:  Procedure Laterality Date  . AMPUTATION Left 02/22/2014   Procedure: INCISION AND DRAINAGE LEFT FOREFOOT AMPUTATION FIRST RAY;  Surgeon: Wylene Simmer, MD;  Location: [place];  Service:  Orthopedics;  Laterality: Left;  . APPENDECTOMY    . EYE SURGERY Bilateral    cataract surgery w/ lens implant  . HERNIA REPAIR     inguinal hernia repair    Social History:  reports that he has never smoked. He has never used smokeless tobacco. He reports current alcohol use. He reports that he does not use drugs.  Allergies: No Known Allergies  Medications Prior to Admission  Medication Sig Dispense Refill  . diphenoxylate-atropine (LOMOTIL) 2.5-0.025 MG tablet Take 1 tablet by mouth 4 (four) times daily as needed for diarrhea or loose stools. 120 tablet 1  . fluticasone (FLONASE) 50 MCG/ACT nasal spray Place 1 spray into both nostrils daily.    Marland Kitchen glucosamine-chondroitin 500-400 MG tablet Take 1 tablet by mouth daily.     Marland Kitchen ibuprofen (ADVIL) 200 MG tablet Take 200-400 mg by mouth in the morning and at bedtime.    . vitamin B-12 (CYANOCOBALAMIN) 1000 MCG tablet Take 1 tablet (1,000 mcg total) by mouth daily. (Patient taking differently: Take 1,000 mcg by mouth every other day. ) 30 tablet 11    Blood pressure 104/73, pulse 87, temperature 97.8 F (36.6 C), temperature source Oral, resp. rate 18, height '5\' 11"'  (1.803 m), weight 81.6 kg, SpO2 97 %. Physical Exam:  General: pleasant, WD, WN male who is laying in bed in NAD HEENT: head is normocephalic, atraumatic.  Sclera are anicteric.  PERRL.  Ears and nose without any masses or lesions.  Mouth is pink and moist Heart: regular, rate, and rhythm.  Normal s1,s2. No obvious  murmurs, gallops, or rubs noted.  Palpable radial and pedal pulses bilaterally Lungs: CTAB, no wheezes, rhonchi, or rales noted.  Respiratory effort nonlabored Abd: soft, NT, negative murphy sign, ND, +BS, no masses, hernias, or organomegaly MS: all 4 extremities are symmetrical with no cyanosis, clubbing, or edema. Skin: warm and dry with no masses, lesions, or rashes Neuro: Cranial nerves 2-12 grossly intact, sensation is normal throughout Psych: A&Ox3 with an  appropriate affect.   Results for orders placed or performed during the hospital encounter of 03/14/20 (from the past 48 hour(s))  CBG monitoring, ED     Status: None   Collection Time: 03/14/20  4:05 PM  Result Value Ref Range   Glucose-Capillary 94 70 - 99 mg/dL    Comment: Glucose reference range applies only to samples taken after fasting for at least 8 hours.  Comprehensive metabolic panel     Status: Abnormal   Collection Time: 03/14/20  4:16 PM  Result Value Ref Range   Sodium 137 135 - 145 mmol/L   Potassium 3.3 (L) 3.5 - 5.1 mmol/L   Chloride 105 98 - 111 mmol/L   CO2 18 (L) 22 - 32 mmol/L   Glucose, Bld 106 (H) 70 - 99 mg/dL    Comment: Glucose reference range applies only to samples taken after fasting for at least 8 hours.   BUN 33 (H) 8 - 23 mg/dL   Creatinine, Ser 2.03 (H) 0.61 - 1.24 mg/dL   Calcium 9.0 8.9 - 10.3 mg/dL   Total Protein 6.9 6.5 - 8.1 g/dL   Albumin 4.0 3.5 - 5.0 g/dL   AST 378 (H) 15 - 41 U/L   ALT 294 (H) 0 - 44 U/L   Alkaline Phosphatase 196 (H) 38 - 126 U/L   Total Bilirubin 4.0 (H) 0.3 - 1.2 mg/dL   GFR, Estimated 29 (L) >60 mL/min    Comment: (NOTE) Calculated using the CKD-EPI Creatinine Equation (2021)    Anion gap 14 5 - 15    Comment: Performed at [facility], [place] [address], [place], [place] [address]  CBC     Status: Abnormal   Collection Time: 03/14/20  4:16 PM  Result Value Ref Range   WBC 13.3 (H) 4.0 - 10.5 K/uL   RBC 4.21 (L) 4.22 - 5.81 MIL/uL   Hemoglobin 13.2 13.0 - 17.0 g/dL   HCT 40.6 39 - 52 %   MCV 96.4 80.0 - 100.0 fL   MCH 31.4 26.0 - 34.0 pg   MCHC 32.5 30.0 - 36.0 g/dL   RDW 13.4 11.5 - 15.5 %   Platelets 214 150 - 400 K/uL   nRBC 0.0 0.0 - 0.2 %    Comment: Performed at [facility], [place] [address] [place],  [address]  Resp Panel by RT-PCR (Flu A&B, Covid) Nasopharyngeal Swab     Status: None   Collection Time: 03/14/20  6:22 PM   Specimen: Nasopharyngeal Swab;  Nasopharyngeal(NP) swabs in vial transport medium  Result Value Ref Range   SARS Coronavirus 2 by RT PCR NEGATIVE NEGATIVE    Comment: (NOTE) SARS-CoV-2 target nucleic acids are NOT DETECTED.  The SARS-CoV-2 RNA is generally detectable in upper respiratory specimens during the acute phase of infection. The lowest concentration of SARS-CoV-2 viral copies this assay can detect is 138 copies/mL. A negative result does not preclude SARS-Cov-2 infection and should not be used as the sole basis for treatment or other patient management decisions. A negative result  may occur with  improper specimen collection/handling, submission of specimen other than nasopharyngeal swab, presence of viral mutation(s) within the areas targeted by this assay, and inadequate number of viral copies(<138 copies/mL). A negative result must be combined with clinical observations, patient history, and epidemiological information. The expected result is Negative.  Fact Sheet for Patients:  EntrepreneurPulse.com.au  Fact Sheet for Healthcare Providers:  IncredibleEmployment.be  This test is no t yet approved or cleared by the [place] FDA and  has been authorized for detection and/or diagnosis of SARS-CoV-2 by FDA under an Emergency Use Authorization (EUA). This EUA will remain  in effect (meaning this test can be used) for the duration of the COVID-19 declaration under Section 564(b)(1) of the Act, 21 U.S.C.section 360bbb-3(b)(1), unless the authorization is terminated  or revoked sooner.       Influenza A by PCR NEGATIVE NEGATIVE   Influenza B by PCR NEGATIVE NEGATIVE    Comment: (NOTE) The Xpert Xpress SARS-CoV-2/FLU/RSV plus assay is intended as an aid in the diagnosis of influenza from Nasopharyngeal swab specimens and should not be used as a sole basis for treatment. Nasal washings and aspirates are unacceptable for Xpert Xpress  SARS-CoV-2/FLU/RSV testing.  Fact Sheet for Patients: EntrepreneurPulse.com.au  Fact Sheet for Healthcare Providers: IncredibleEmployment.be  This test is not yet approved or cleared by the [place] FDA and has been authorized for detection and/or diagnosis of SARS-CoV-2 by FDA under an Emergency Use Authorization (EUA). This EUA will remain in effect (meaning this test can be used) for the duration of the COVID-19 declaration under Section 564(b)(1) of the Act, 21 U.S.C. section 360bbb-3(b)(1), unless the authorization is terminated or revoked.  Performed at [facility], [place] [address], [place], [place] [address]   CK     Status: Abnormal   Collection Time: 03/14/20  9:00 PM  Result Value Ref Range   Total CK 5,854 (H) 49.0 - 397.0 U/L    Comment: RESULTS CONFIRMED BY MANUAL DILUTION Performed at [place] [address] [place], [place] [address]   Lipase, blood     Status: None   Collection Time: 03/14/20  9:00 PM  Result Value Ref Range   Lipase 27 11 - 51 U/L    Comment: Performed at [facility], [place] [address] [place], [place] [address]  Ammonia     Status: Abnormal   Collection Time: 03/14/20  9:00 PM  Result Value Ref Range   Ammonia 47 (H) 9 - 35 umol/L    Comment: Performed at [facility], [place] [address] [place], [place] [address]  Urinalysis, Routine w reflex microscopic Urine, Clean Catch     Status: Abnormal   Collection Time: 03/14/20 11:00 PM  Result Value Ref Range   Color, Urine AMBER (A) YELLOW    Comment: BIOCHEMICALS MAY BE AFFECTED BY COLOR   APPearance HAZY (A) CLEAR   Specific Gravity, Urine 1.014 1.005 - 1.030   pH 5.0 5.0 - 8.0   Glucose, UA NEGATIVE NEGATIVE mg/dL   Hgb urine dipstick MODERATE (A) NEGATIVE   Bilirubin Urine NEGATIVE NEGATIVE   Ketones, ur NEGATIVE NEGATIVE mg/dL   Protein, ur 30 (A) NEGATIVE mg/dL    Nitrite NEGATIVE NEGATIVE   Leukocytes,Ua NEGATIVE NEGATIVE   RBC / HPF 6-10 0 - 5 RBC/hpf   WBC, UA 6-10 0 - 5 WBC/hpf   Bacteria, UA RARE (A) NONE SEEN   Squamous Epithelial / LPF 0-5 0 - 5  Mucus PRESENT    Hyaline Casts, UA PRESENT     Comment: Performed at [facility], [place] [address], [place], [place] [address]  Comprehensive metabolic panel     Status: Abnormal   Collection Time: 03/15/20  6:12 AM  Result Value Ref Range   Sodium 138 135 - 145 mmol/L   Potassium 3.2 (L) 3.5 - 5.1 mmol/L   Chloride 109 98 - 111 mmol/L   CO2 19 (L) 22 - 32 mmol/L   Glucose, Bld 118 (H) 70 - 99 mg/dL    Comment: Glucose reference range applies only to samples taken after fasting for at least 8 hours.   BUN 29 (H) 8 - 23 mg/dL   Creatinine, Ser 1.39 (H) 0.61 - 1.24 mg/dL   Calcium 8.4 (L) 8.9 - 10.3 mg/dL   Total Protein 5.5 (L) 6.5 - 8.1 g/dL   Albumin 3.1 (L) 3.5 - 5.0 g/dL   AST 203 (H) 15 - 41 U/L   ALT 208 (H) 0 - 44 U/L   Alkaline Phosphatase 155 (H) 38 - 126 U/L   Total Bilirubin 2.6 (H) 0.3 - 1.2 mg/dL   GFR, Estimated 46 (L) >60 mL/min    Comment: (NOTE) Calculated using the CKD-EPI Creatinine Equation (2021)    Anion gap 10 5 - 15    Comment: Performed at [facility], [place] [address], [place], [place] [address]  CBC     Status: Abnormal   Collection Time: 03/15/20  6:12 AM  Result Value Ref Range   WBC 7.9 4.0 - 10.5 K/uL   RBC 3.95 (L) 4.22 - 5.81 MIL/uL   Hemoglobin 12.1 (L) 13.0 - 17.0 g/dL   HCT 37.2 (L) 39 - 52 %   MCV 94.2 80.0 - 100.0 fL   MCH 30.6 26.0 - 34.0 pg   MCHC 32.5 30.0 - 36.0 g/dL   RDW 13.5 11.5 - 15.5 %   Platelets 177 150 - 400 K/uL   nRBC 0.0 0.0 - 0.2 %    Comment: Performed at [facility], [place] [address] [place], [place] [address]  Bilirubin, direct     Status: Abnormal   Collection Time: 03/15/20  6:12 AM  Result Value Ref Range   Bilirubin, Direct 1.4 (H) 0.0 - 0.2 mg/dL    Comment:  Performed at [facility], [place] [address] [place], [place] [address]   CT Head Wo Contrast  Result Date: 03/14/2020 CLINICAL DATA:  Delirium, weakness, slurred speech, hallucinations, altered mental status, possible UTI EXAM: CT HEAD WITHOUT CONTRAST TECHNIQUE: Contiguous axial images were obtained from the base of the skull through the vertex without intravenous contrast. Sagittal and coronal MPR images reconstructed from axial data set. COMPARISON:  None FINDINGS: Brain: Generalized atrophy. Normal ventricular morphology. No midline shift or mass effect. Small vessel chronic ischemic changes of deep cerebral white matter. No intracranial hemorrhage, mass lesion, evidence of acute infarction, or extra-axial fluid collection. Vascular: Atherosclerotic calcification of internal carotid and vertebral arteries at skull base Skull: Demineralized but intact Sinuses/Orbits: Clear Other: N/A IMPRESSION: Atrophy with small vessel chronic ischemic changes of deep cerebral white matter. No acute intracranial abnormalities. Electronically Signed   By: Lavonia Dana M.D.   On: 03/14/2020 18:55   US Abdomen Limited  Result Date: 03/14/2020 CLINICAL DATA:  Transaminitis EXAM: ULTRASOUND ABDOMEN LIMITED RIGHT UPPER QUADRANT COMPARISON:  None. FINDINGS: Gallbladder: There is gallbladder sludge with mild gallbladder wall thickening. The gallbladder wall measures up  to approximately 3.4 mm. The sonographic Percell Miller sign is negative. The gallbladder is distended. Common bile duct: Diameter: 7 mm. Liver: No focal lesion identified. Within normal limits in parenchymal echogenicity. Portal vein is patent on color Doppler imaging with normal direction of blood flow towards the liver. Other: None. IMPRESSION: 1. Distended gallbladder with gallbladder sludge and mild gallbladder wall thickening. However, the sonographic Percell Miller sign is reported as negative. As such, findings are equivocal for acute cholecystitis.  Consider HIDA scan for further evaluation as clinically indicated. 2. The common bile duct measures at the upper limits of normal. Electronically Signed   By: Constance Holster M.D.   On: 03/14/2020 18:50      Assessment/Plan Arthritis Chronic vision problems HLD hx of nephrolithiasis Dehydration - Cr 2.03 on admit and down to 1.39 this AM, CK was >5K on admission  Elevated LFTs and total bilirubin - Alk phos, AST/ALT and Tbili all elevated on admission - trending down this AM but still elevated direct bilirubin 1.4 - patient completely non-tender on exam and has imaging that is equivocal for cholecystitis  - WBC was 13 on admit but 7.9 this AM and patient is afebrile - I do not have a strong suspicion for cholecystitis at this time, more likely patient could have passed a gallstone or could have some underlying liver dysfunction - ammonia was also elevated at 47 - would recommend GI consult  - no indication for emergent surgical intervention at this time, he is ok to have a diet from a surgical standpoint but would defer to GI if he needs to remain NPO today   FEN: NPO, IVF VTE: SCDs ID: [id number] 12/7>>  Norm Parcel, [facility] Surgery 03/15/2020, 8:23 AM Please see Amion for pager number during day hours 7:00am-4:30pm

## 2020-03-15 NOTE — Progress Notes (Signed)
PROGRESS NOTE    Cory Hansen  ZOX:[id number] DOB: Jun 18, 1920 DOA: 03/14/2020 PCP: [name], Elizabeth A, MD     Brief Narrative:  Cory Hansen is a 86 y.o. WM PMHx osteoarthritis, nephrolithiasis,chronic low back pain, HLD,  Presents to the ER with abdominal pain dark urine weakness and some slurred speech since yesterday.  He lives alone at home but daughter has been checking in on him repeatedly.  She found him in the extubation today.  She was worried about his condition and brought him to the ER thinking he may have UTI.  Patient was seen and evaluated in the ER.  He has elevated liver function test with abdominal pain and right upper quadrant abdominal tenderness.  Also found to be in acute kidney injury hypokalemia.  Ultrasound of the abdomen confirmed sludge with gallbladder thickening consistent with possible acute cholecystitis.  Surgery consulted and recommends admission to medical service for presumed acute cholecystitis and surgery will follow.  Patient has some nausea but no vomiting.  Has not had any diarrhea.  Patient will be admitted therefore for further evaluation and treatment..  ED Course: Temperature 97.9 blood pressure 95/62 pulse 87 respiratory rate of 18 oxygen sat 178.  White count is 13.3 hemoglobin 13.2 and platelets 214.  Sodium 137 potassium 3.3 chloride 105.  CO2 of 18 BUN 33 creatinine 2.03 and calcium 9.0 glucose 106.  Viral screen including COVID-19 is negative ultrasound abdomen showed distended gallbladder negative Murphy sign but telltale sign of cholecystitis.  Ammonia is 47 AST 378 ALT 294 total bilirubin 4.0 with GFR of 29.  Alkaline phosphatase 196.  Head CT without contrast is negative.  Based on patient's finding surgery consulted and recommends admission for possible surgical evaluation and treatment    Subjective: Afebrile overnight A/O x4, negative abdominal pain.  Stated had some pain in his hand and arm around site where he was receiving  potassium but now no burning at all, does not want us to change to p.o. K.   Assessment & Plan: Covid vaccination; vaccinated   Principal Problem:   Acute cholecystitis Active Problems:   Hyperlipidemia   Hx of benign neoplasm of prostate   AKI (acute kidney injury) (HCC)   Hypokalemia   acute cholecystitis with cholelithiasis:  -Liver WNL by US -See elevated LFT  Elevated LFTs -Evaluated by surgery and GI -Conservative management.  Not cholecystitis or cholelithiasis.  weakness with slurred speech:  -Negative slurred speech -12/9 consult PT/OT  AKI: Most likely prerenal.   Lab Results  Component Value Date   CREATININE 1.39 (H) 03/15/2020   CREATININE 2.03 (H) 03/14/2020   CREATININE 1.05 05/07/2019   CREATININE 1.04 10/31/2017   CREATININE 1.04 10/27/2015  -Resolving  HLD -Lipid panel pending   Hypokalemia -Potassium goal> 4 -Potassium 50 mEq -Recheck K/Mg @ 1700     DVT prophylaxis: Lovenox Code Status: Full Family Communication: 12/8 daughter at bedside for discussion of plan of care answered all questions Status is: Inpatient    Dispo: The patient is from: Home              Anticipated d/c is to: Home              Anticipated d/c date is: 12/10              Patient currently unstable      Consultants:    Procedures/Significant Events:    I have personally reviewed and interpreted all radiology studies and my findings are  as above.  VENTILATOR SETTINGS:    Cultures   Antimicrobials:   Devices    LINES / TUBES:      Continuous Infusions: . dextrose 5% lactated ringers with KCl 20 mEq/L 125 mL/hr at 03/15/20 0014  . piperacillin-tazobactam (ZOSYN)  IV 3.375 g (03/15/20 0440)     Objective: Vitals:   03/14/20 2314 03/15/20 0208 03/15/20 0627 03/15/20 0637  BP: 119/78 100/67 104/72 104/73  Pulse: 83 79 82 87  Resp: 18 17 16 18   Temp: 98.5 F (36.9 C) 98.7 F (37.1 C) 97.8 F (36.6 C) 97.8 F (36.6 C)   TempSrc: Oral Oral Oral Oral  SpO2: 93% 98% 98% 97%  Weight:      Height:        Intake/Output Summary (Last 24 hours) at 03/15/2020 0851 Last data filed at 03/15/2020 0600 Gross per 24 hour  Intake 1000 ml  Output 200 ml  Net 800 ml   Filed Weights   03/14/20 1555  Weight: 81.6 kg    Examination:  General: A/O x4, No acute respiratory distress Eyes: negative scleral hemorrhage, negative anisocoria, negative icterus ENT: Negative Runny nose, negative gingival bleeding, Neck:  Negative scars, masses, torticollis, lymphadenopathy, JVD Lungs: Clear to auscultation bilaterally without wheezes or crackles Cardiovascular: Regular rate and rhythm without murmur gallop or rub normal S1 and S2 Abdomen: negative abdominal pain, nondistended, positive soft, bowel sounds, no rebound, no ascites, no appreciable mass Extremities: No significant cyanosis, clubbing, or edema bilateral lower extremities Skin: Negative rashes, lesions, ulcers Psychiatric:  Negative depression, negative anxiety, negative fatigue, negative mania  Central nervous system:  Cranial nerves II through XII intact, tongue/uvula midline, all extremities muscle strength 5/5, sensation intact throughout, negative dysarthria, negative expressive aphasia, negative receptive aphasia.  .     Data Reviewed: Care during the described time interval was provided by me .  I have reviewed this patient's available data, including medical history, events of note, physical examination, and all test results as part of my evaluation.  CBC: Recent Labs  Lab 03/14/20 1616 03/15/20 0612  WBC 13.3* 7.9  HGB 13.2 12.1*  HCT 40.6 37.2*  MCV 96.4 94.2  PLT 214 177   Basic Metabolic Panel: Recent Labs  Lab 03/14/20 1616 03/15/20 0612  NA 137 138  K 3.3* 3.2*  CL 105 109  CO2 18* 19*  GLUCOSE 106* 118*  BUN 33* 29*  CREATININE 2.03* 1.39*  CALCIUM 9.0 8.4*   GFR: Estimated Creatinine Clearance: 32.4 mL/min (A) (by C-G  formula based on SCr of 1.39 mg/dL (H)). Liver Function Tests: Recent Labs  Lab 03/14/20 1616 03/15/20 0612  AST 378* 203*  ALT 294* 208*  ALKPHOS 196* 155*  BILITOT 4.0* 2.6*  PROT 6.9 5.5*  ALBUMIN 4.0 3.1*   Recent Labs  Lab 03/14/20 2100  LIPASE 27   Recent Labs  Lab 03/14/20 2100  AMMONIA 47*   Coagulation Profile: No results for input(s): INR, PROTIME in the last 168 hours. Cardiac Enzymes: Recent Labs  Lab 03/14/20 2100  CKTOTAL 5,854*   BNP (last 3 results) No results for input(s): PROBNP in the last 8760 hours. HbA1C: No results for input(s): HGBA1C in the last 72 hours. CBG: Recent Labs  Lab 03/14/20 1605  GLUCAP 94   Lipid Profile: No results for input(s): CHOL, HDL, LDLCALC, TRIG, CHOLHDL, LDLDIRECT in the last 72 hours. Thyroid Function Tests: No results for input(s): TSH, T4TOTAL, FREET4, T3FREE, THYROIDAB in the last 72 hours. Anemia Panel: No  results for input(s): VITAMINB12, FOLATE, FERRITIN, TIBC, IRON, RETICCTPCT in the last 72 hours. Sepsis Labs: No results for input(s): PROCALCITON, LATICACIDVEN in the last 168 hours.  Recent Results (from the past 240 hour(s))  Group A Strep by PCR     Status: None   Collection Time: 03/08/20 10:04 AM   Specimen: Throat; Sterile Swab  Result Value Ref Range Status   Group A Strep by PCR NOT DETECTED NOT DETECTED Final    Comment: Performed at [facility], [address] [place], [place] [address]  Resp Panel by RT-PCR (Flu A&B, Covid) Nasopharyngeal Swab     Status: None   Collection Time: 03/08/20  1:17 PM   Specimen: Nasopharyngeal Swab; Nasopharyngeal(NP) swabs in vial transport medium  Result Value Ref Range Status   SARS Coronavirus 2 by RT PCR NEGATIVE NEGATIVE Final    Comment: (NOTE) SARS-CoV-2 target nucleic acids are NOT DETECTED.  The SARS-CoV-2 RNA is generally detectable in upper respiratory specimens during the acute phase of infection. The lowest concentration of  SARS-CoV-2 viral copies this assay can detect is 138 copies/mL. A negative result does not preclude SARS-Cov-2 infection and should not be used as the sole basis for treatment or other patient management decisions. A negative result may occur with  improper specimen collection/handling, submission of specimen other than nasopharyngeal swab, presence of viral mutation(s) within the areas targeted by this assay, and inadequate number of viral copies(<138 copies/mL). A negative result must be combined with clinical observations, patient history, and epidemiological information. The expected result is Negative.  Fact Sheet for Patients:  BloggerCourse.com  Fact Sheet for Healthcare Providers:  SeriousBroker.it  This test is no t yet approved or cleared by the [place] FDA and  has been authorized for detection and/or diagnosis of SARS-CoV-2 by FDA under an Emergency Use Authorization (EUA). This EUA will remain  in effect (meaning this test can be used) for the duration of the COVID-19 declaration under Section 564(b)(1) of the Act, 21 U.S.C.section 360bbb-3(b)(1), unless the authorization is terminated  or revoked sooner.       Influenza A by PCR NEGATIVE NEGATIVE Final   Influenza B by PCR NEGATIVE NEGATIVE Final    Comment: (NOTE) The Xpert Xpress SARS-CoV-2/FLU/RSV plus assay is intended as an aid in the diagnosis of influenza from Nasopharyngeal swab specimens and should not be used as a sole basis for treatment. Nasal washings and aspirates are unacceptable for Xpert Xpress SARS-CoV-2/FLU/RSV testing.  Fact Sheet for Patients: BloggerCourse.com  Fact Sheet for Healthcare Providers: SeriousBroker.it  This test is not yet approved or cleared by the [place] FDA and has been authorized for detection and/or diagnosis of SARS-CoV-2 by FDA under an Emergency Use  Authorization (EUA). This EUA will remain in effect (meaning this test can be used) for the duration of the COVID-19 declaration under Section 564(b)(1) of the Act, 21 U.S.C. section 360bbb-3(b)(1), unless the authorization is terminated or revoked.  Performed at [facility], [address]  [place], [place] [address]   Resp Panel by RT-PCR (Flu A&B, Covid) Nasopharyngeal Swab     Status: None   Collection Time: 03/14/20  6:22 PM   Specimen: Nasopharyngeal Swab; Nasopharyngeal(NP) swabs in vial transport medium  Result Value Ref Range Status   SARS Coronavirus 2 by RT PCR NEGATIVE NEGATIVE Final    Comment: (NOTE) SARS-CoV-2 target nucleic acids are NOT DETECTED.  The SARS-CoV-2 RNA is generally detectable in upper respiratory specimens during the acute  phase of infection. The lowest concentration of SARS-CoV-2 viral copies this assay can detect is 138 copies/mL. A negative result does not preclude SARS-Cov-2 infection and should not be used as the sole basis for treatment or other patient management decisions. A negative result may occur with  improper specimen collection/handling, submission of specimen other than nasopharyngeal swab, presence of viral mutation(s) within the areas targeted by this assay, and inadequate number of viral copies(<138 copies/mL). A negative result must be combined with clinical observations, patient history, and epidemiological information. The expected result is Negative.  Fact Sheet for Patients:  BloggerCourse.com  Fact Sheet for Healthcare Providers:  SeriousBroker.it  This test is no t yet approved or cleared by the [place] FDA and  has been authorized for detection and/or diagnosis of SARS-CoV-2 by FDA under an Emergency Use Authorization (EUA). This EUA will remain  in effect (meaning this test can be used) for the duration of the COVID-19 declaration under Section 564(b)(1) of  the Act, 21 U.S.C.section 360bbb-3(b)(1), unless the authorization is terminated  or revoked sooner.       Influenza A by PCR NEGATIVE NEGATIVE Final   Influenza B by PCR NEGATIVE NEGATIVE Final    Comment: (NOTE) The Xpert Xpress SARS-CoV-2/FLU/RSV plus assay is intended as an aid in the diagnosis of influenza from Nasopharyngeal swab specimens and should not be used as a sole basis for treatment. Nasal washings and aspirates are unacceptable for Xpert Xpress SARS-CoV-2/FLU/RSV testing.  Fact Sheet for Patients: BloggerCourse.com  Fact Sheet for Healthcare Providers: SeriousBroker.it  This test is not yet approved or cleared by the [place] FDA and has been authorized for detection and/or diagnosis of SARS-CoV-2 by FDA under an Emergency Use Authorization (EUA). This EUA will remain in effect (meaning this test can be used) for the duration of the COVID-19 declaration under Section 564(b)(1) of the Act, 21 U.S.C. section 360bbb-3(b)(1), unless the authorization is terminated or revoked.  Performed at [facility], [address], [place], [place] [address]          Radiology Studies: CT Head Wo Contrast  Result Date: 03/14/2020 CLINICAL DATA:  Delirium, weakness, slurred speech, hallucinations, altered mental status, possible UTI EXAM: CT HEAD WITHOUT CONTRAST TECHNIQUE: Contiguous axial images were obtained from the base of the skull through the vertex without intravenous contrast. Sagittal and coronal MPR images reconstructed from axial data set. COMPARISON:  None FINDINGS: Brain: Generalized atrophy. Normal ventricular morphology. No midline shift or mass effect. Small vessel chronic ischemic changes of deep cerebral white matter. No intracranial hemorrhage, mass lesion, evidence of acute infarction, or extra-axial fluid collection. Vascular: Atherosclerotic calcification of internal carotid and  vertebral arteries at skull base Skull: Demineralized but intact Sinuses/Orbits: Clear Other: N/A IMPRESSION: Atrophy with small vessel chronic ischemic changes of deep cerebral white matter. No acute intracranial abnormalities. Electronically Signed   By: Ulyses Southward M.D.   On: 03/14/2020 18:55   US Abdomen Limited  Result Date: 03/14/2020 CLINICAL DATA:  Transaminitis EXAM: ULTRASOUND ABDOMEN LIMITED RIGHT UPPER QUADRANT COMPARISON:  None. FINDINGS: Gallbladder: There is gallbladder sludge with mild gallbladder wall thickening. The gallbladder wall measures up to approximately 3.4 mm. The sonographic Eulah Pont sign is negative. The gallbladder is distended. Common bile duct: Diameter: 7 mm. Liver: No focal lesion identified. Within normal limits in parenchymal echogenicity. Portal vein is patent on color Doppler imaging with normal direction of blood flow towards the liver. Other: None. IMPRESSION: 1. Distended gallbladder with gallbladder sludge and  mild gallbladder wall thickening. However, the sonographic Eulah Pont sign is reported as negative. As such, findings are equivocal for acute cholecystitis. Consider HIDA scan for further evaluation as clinically indicated. 2. The common bile duct measures at the upper limits of normal. Electronically Signed   By: Katherine Mantle M.D.   On: 03/14/2020 18:50        Scheduled Meds: . enoxaparin (LOVENOX) injection  30 mg Subcutaneous Q24H  . fluticasone  1 spray Each Nare Daily   Continuous Infusions: . dextrose 5% lactated ringers with KCl 20 mEq/L 125 mL/hr at 03/15/20 0014  . piperacillin-tazobactam (ZOSYN)  IV 3.375 g (03/15/20 0440)     LOS: 1 day    Time spent:40 min    Man Bonneau, Roselind Messier, MD [facility] Pager [phone]  If 7PM-7AM, please contact night-coverage [email or web address] Password [facility] 03/15/2020, 8:51 AM

## 2020-03-15 NOTE — Consult Note (Signed)
[facility] Primary Care Physician:  [name], Elizabeth A, MD Primary Gastroenterologist:  [facility] (previous patient of Dr. Madilyn FiremanHayes)  Reason for Consultation: Transaminitis  HPI: Cory Hansen is a 86 y.o. male with past medical history of nephrolithiasis presenting for consultation of transaminitis.  Patient's main complaint is weakness.  He states he feels like he cannot use his legs.  Per ED note, patient also had recently dark urine and some confusion.  Today, patient denies any gastrointestinal complaints.  Denies abdominal pain, nausea, vomiting, hematemesis, dysphagia, GERD, melena, or hematochezia states he did have one episode of diarrhea over the last week but this has resolved.  Denies changes in appetite or unexplained weight loss.  Denies changes in medications or new medications. Denies Tylenol use. Denies alcohol use, stating he only rarely drinks alcohol.  Denies family history of liver disease, colon cancer, or other gastrointestinal malignancy.  RUQ US 03/14/20: Distended gallbladder with gallbladder sludge and mild gallbladder wall thickening. However, the sonographic Eulah PontMurphy sign is reported as negative. As such, findings are equivocal for acute cholecystitis. Consider HIDA scan for further evaluation as clinically indicated. The common bile duct measures at the upper limits of normal.  Past Medical History:  Diagnosis Date  . Arthritis   . Kidney stones   . Shingles     Past Surgical History:  Procedure Laterality Date  . AMPUTATION Left 02/22/2014   Procedure: INCISION AND DRAINAGE LEFT FOREFOOT AMPUTATION FIRST RAY;  Surgeon: Toni ArthursJohn Hewitt, MD;  Location: [facility] OR;  Service: Orthopedics;  Laterality: Left;  . APPENDECTOMY    . EYE SURGERY Bilateral    cataract surgery w/ lens implant  . HERNIA REPAIR     inguinal hernia repair    Prior to Admission medications   Medication Sig Start Date End Date Taking? Authorizing Provider  diphenoxylate-atropine  (LOMOTIL) 2.5-0.025 MG tablet Take 1 tablet by mouth 4 (four) times daily as needed for diarrhea or loose stools. 10/31/17  Yes [name], Elizabeth A, MD  fluticasone [facility](FLONASE) 50 MCG/ACT nasal spray Place 1 spray into both nostrils daily.   Yes [provider]  glucosamine-chondroitin 500-400 MG tablet Take 1 tablet by mouth daily.    Yes [provider]  ibuprofen (ADVIL) 200 MG tablet Take 200-400 mg by mouth in the morning and at bedtime.   Yes [provider]  vitamin B-12 (CYANOCOBALAMIN) 1000 MCG tablet Take 1 tablet (1,000 mcg total) by mouth daily. Patient taking differently: Take 1,000 mcg by mouth every other day.  10/14/14  Yes [name], Elizabeth A, MD    Scheduled Meds: . enoxaparin (LOVENOX) injection  30 mg Subcutaneous Q24H  . fluticasone  1 spray Each Nare Daily   Continuous Infusions: . dextrose 5% lactated ringers with KCl 20 mEq/L 125 mL/hr at 03/15/20 0014  . piperacillin-tazobactam (ZOSYN)  IV 3.375 g (03/15/20 0440)  . potassium chloride     PRN Meds:.morphine injection, ondansetron **OR** ondansetron (ZOFRAN) IV  Allergies as of 03/14/2020  . (No Known Allergies)    History reviewed. No pertinent family history.  Social History   Socioeconomic History  . Marital status: Widowed    Spouse name: Not on file  . Number of children: Not on file  . Years of education: Not on file  . Highest education level: Not on file  Occupational History  . Not on file  Tobacco Use  . Smoking status: Never Smoker  . Smokeless tobacco: Never Used  Substance and Sexual Activity  . Alcohol  use: Yes    Alcohol/week: 0.0 standard drinks    Comment: very rare  . Drug use: No  . Sexual activity: Not on file  Other Topics Concern  . Not on file  Social History Narrative  . Not on file   Social Determinants of Health   Financial Resource Strain:   . Difficulty of Paying Living Expenses: Not on file  Food Insecurity:   . Worried About Community education officer in the Last Year: Not on file  . Ran Out of Food in the Last Year: Not on file  Transportation Needs:   . Lack of Transportation (Medical): Not on file  . Lack of Transportation (Non-Medical): Not on file  Physical Activity:   . Days of Exercise per Week: Not on file  . Minutes of Exercise per Session: Not on file  Stress:   . Feeling of Stress : Not on file  Social Connections:   . Frequency of Communication with Friends and Family: Not on file  . Frequency of Social Gatherings with Friends and Family: Not on file  . Attends Religious Services: Not on file  . Active Member of Clubs or Organizations: Not on file  . Attends Banker Meetings: Not on file  . Marital Status: Not on file  Intimate Partner Violence:   . Fear of Current or Ex-Partner: Not on file  . Emotionally Abused: Not on file  . Physically Abused: Not on file  . Sexually Abused: Not on file    Review of Systems: Review of Systems  Constitutional: Positive for malaise/fatigue. Negative for chills, fever and weight loss.  HENT: Negative for hearing loss and tinnitus.   Eyes: Negative for pain and redness.  Respiratory: Negative for cough and shortness of breath.   Cardiovascular: Negative for chest pain and palpitations.  Gastrointestinal: Negative for abdominal pain, blood in stool, constipation, diarrhea, heartburn, melena, nausea and vomiting.  Genitourinary: Negative for dysuria and urgency.  Musculoskeletal: Negative for falls and joint pain.  Skin: Negative for itching and rash.  Neurological: Positive for weakness. Negative for loss of consciousness.  Endo/Heme/Allergies: Negative for polydipsia. Does not bruise/bleed easily.  Psychiatric/Behavioral: Negative for substance abuse. The patient is not nervous/anxious.      Physical Exam: Vital signs: Vitals:   03/15/20 0637 03/15/20 1024  BP: 104/73 108/72  Pulse: 87 75  Resp: 18 18  Temp: 97.8 F (36.6 C) 97.8 F (36.6 C)   SpO2: 97% 96%     Physical Exam Vitals reviewed.  Constitutional:      General: He is not in acute distress.    Comments: elderly  HENT:     Head: Normocephalic and atraumatic.     Nose: Nose normal. No congestion.     Mouth/Throat:     Mouth: Mucous membranes are moist.     Pharynx: Oropharynx is clear.  Eyes:     General: No scleral icterus.    Extraocular Movements: Extraocular movements intact.     Conjunctiva/sclera: Conjunctivae normal.  Cardiovascular:     Rate and Rhythm: Normal rate and regular rhythm.     Pulses: Normal pulses.  Pulmonary:     Effort: Pulmonary effort is normal. No respiratory distress.     Breath sounds: Normal breath sounds.  Abdominal:     General: Bowel sounds are normal. There is no distension.     Palpations: Abdomen is soft. There is no mass.     Tenderness: There is no abdominal tenderness. There  is no guarding or rebound. Negative signs include Murphy's sign.     Hernia: No hernia is present.  Musculoskeletal:        General: No swelling or tenderness.     Cervical back: Normal range of motion and neck supple.  Skin:    General: Skin is warm and dry.     Coloration: Skin is not jaundiced.  Neurological:     General: No focal deficit present.     Mental Status: He is oriented to person, place, and time. He is lethargic.  Psychiatric:        Mood and Affect: Mood normal.        Behavior: Behavior normal. Behavior is cooperative.      GI:  Lab Results: Recent Labs    03/14/20 1616 03/15/20 0612  WBC 13.3* 7.9  HGB 13.2 12.1*  HCT 40.6 37.2*  PLT 214 177   BMET Recent Labs    03/14/20 1616 03/15/20 0612  NA 137 138  K 3.3* 3.2*  CL 105 109  CO2 18* 19*  GLUCOSE 106* 118*  BUN 33* 29*  CREATININE 2.03* 1.39*  CALCIUM 9.0 8.4*   LFT Recent Labs    03/15/20 0612  PROT 5.5*  ALBUMIN 3.1*  AST 203*  ALT 208*  ALKPHOS 155*  BILITOT 2.6*  BILIDIR 1.4*   PT/INR No results for input(s): LABPROT, INR in the  last 72 hours.   Studies/Results: CT Head Wo Contrast  Result Date: 03/14/2020 CLINICAL DATA:  Delirium, weakness, slurred speech, hallucinations, altered mental status, possible UTI EXAM: CT HEAD WITHOUT CONTRAST TECHNIQUE: Contiguous axial images were obtained from the base of the skull through the vertex without intravenous contrast. Sagittal and coronal MPR images reconstructed from axial data set. COMPARISON:  None FINDINGS: Brain: Generalized atrophy. Normal ventricular morphology. No midline shift or mass effect. Small vessel chronic ischemic changes of deep cerebral white matter. No intracranial hemorrhage, mass lesion, evidence of acute infarction, or extra-axial fluid collection. Vascular: Atherosclerotic calcification of internal carotid and vertebral arteries at skull base Skull: Demineralized but intact Sinuses/Orbits: Clear Other: N/A IMPRESSION: Atrophy with small vessel chronic ischemic changes of deep cerebral white matter. No acute intracranial abnormalities. Electronically Signed   By: Ulyses Southward M.D.   On: 03/14/2020 18:55   US Abdomen Limited  Result Date: 03/14/2020 CLINICAL DATA:  Transaminitis EXAM: ULTRASOUND ABDOMEN LIMITED RIGHT UPPER QUADRANT COMPARISON:  None. FINDINGS: Gallbladder: There is gallbladder sludge with mild gallbladder wall thickening. The gallbladder wall measures up to approximately 3.4 mm. The sonographic Eulah Pont sign is negative. The gallbladder is distended. Common bile duct: Diameter: 7 mm. Liver: No focal lesion identified. Within normal limits in parenchymal echogenicity. Portal vein is patent on color Doppler imaging with normal direction of blood flow towards the liver. Other: None. IMPRESSION: 1. Distended gallbladder with gallbladder sludge and mild gallbladder wall thickening. However, the sonographic Eulah Pont sign is reported as negative. As such, findings are equivocal for acute cholecystitis. Consider HIDA scan for further evaluation as clinically  indicated. 2. The common bile duct measures at the upper limits of normal. Electronically Signed   By: Katherine Mantle M.D.   On: 03/14/2020 18:50    Impression: Transaminitis: possibly related to rhabdomyolysis vs. Cholecystitis/cholelithiasis.  US revealed gallbladder sludge and wall thickening. Patient without abdominal pain.  Based on down trending LFTs and lack of abdominal pain, unlikely patient has biliary obstruction. Surgery evaluated, do not suspect cholecystitis.   -LFTs trending down.  T bili 2.6/  AST 203/ ALT 208/ ALP 155 today as compared to T. Bili 4.0/ AST 378/ ALT 294/ ALP 196 yesterday.  Normal LFTs as of 04/2019. -CK 5,854 as of 12/7  AKI: BUN 29/Cr 1.39 today, improved from yesterday BUN 33/Cr 2.03    Plan: Continue to trend LFTs.  Autoimmune markers, ceruloplasmin, alpha-1 anti-trypsin, acute hepatitis panel, ferritin, and iron panel ordered to rule out other etiologies of chronic liver disease.'  HIDA scan can be considered if ongoing clinical suspicion for cholecystitis, though we will leave this up to the surgical team.  Soft diet OK from a GI standpoint.  [facility] will follow.    LOS: 1 day   Edrick Kins  PA-C 03/15/2020, 10:33 AM  Contact #  [phone]

## 2020-03-16 LAB — IRON AND TIBC
Iron: 60 ug/dL (ref 45–182)
Saturation Ratios: 21 % (ref 17.9–39.5)
TIBC: 285 ug/dL (ref 250–450)
UIBC: 225 ug/dL

## 2020-03-16 LAB — LIPID PANEL
Cholesterol: 126 mg/dL (ref 0–200)
HDL: 37 mg/dL — ABNORMAL LOW (ref >40)
LDL Cholesterol: 81 mg/dL (ref 0–99)
Total CHOL/HDL Ratio: 3.4 RATIO
Triglycerides: 41 mg/dL (ref <150)
VLDL: 8 mg/dL (ref 0–40)

## 2020-03-16 LAB — CBC WITH DIFFERENTIAL/PLATELET
Abs Immature Granulocytes: 0.03 10*3/uL (ref 0.00–0.07)
Basophils Absolute: 0 10*3/uL (ref 0.0–0.1)
Basophils Relative: 0 %
Eosinophils Absolute: 0.3 10*3/uL (ref 0.0–0.5)
Eosinophils Relative: 5 %
HCT: 35.6 % — ABNORMAL LOW (ref 39.0–52.0)
Hemoglobin: 11.5 g/dL — ABNORMAL LOW (ref 13.0–17.0)
Immature Granulocytes: 0 %
Lymphocytes Relative: 15 %
Lymphs Abs: 1 10*3/uL (ref 0.7–4.0)
MCH: 30.9 pg (ref 26.0–34.0)
MCHC: 32.3 g/dL (ref 30.0–36.0)
MCV: 95.7 fL (ref 80.0–100.0)
Monocytes Absolute: 0.9 10*3/uL (ref 0.1–1.0)
Monocytes Relative: 13 %
Neutro Abs: 4.5 10*3/uL (ref 1.7–7.7)
Neutrophils Relative %: 67 %
Platelets: 185 10*3/uL (ref 150–400)
RBC: 3.72 MIL/uL — ABNORMAL LOW (ref 4.22–5.81)
RDW: 13.5 % (ref 11.5–15.5)
WBC: 6.8 10*3/uL (ref 4.0–10.5)
nRBC: 0 % (ref 0.0–0.2)

## 2020-03-16 LAB — COMPREHENSIVE METABOLIC PANEL
ALT: 160 U/L — ABNORMAL HIGH (ref 0–44)
AST: 103 U/L — ABNORMAL HIGH (ref 15–41)
Albumin: 3 g/dL — ABNORMAL LOW (ref 3.5–5.0)
Alkaline Phosphatase: 145 U/L — ABNORMAL HIGH (ref 38–126)
Anion gap: 4 — ABNORMAL LOW (ref 5–15)
BUN: 20 mg/dL (ref 8–23)
CO2: 24 mmol/L (ref 22–32)
Calcium: 8.7 mg/dL — ABNORMAL LOW (ref 8.9–10.3)
Chloride: 111 mmol/L (ref 98–111)
Creatinine, Ser: 1.18 mg/dL (ref 0.61–1.24)
GFR, Estimated: 56 mL/min — ABNORMAL LOW (ref >60)
Glucose, Bld: 116 mg/dL — ABNORMAL HIGH (ref 70–99)
Potassium: 4.5 mmol/L (ref 3.5–5.1)
Sodium: 139 mmol/L (ref 135–145)
Total Bilirubin: 1.5 mg/dL — ABNORMAL HIGH (ref 0.3–1.2)
Total Protein: 5.3 g/dL — ABNORMAL LOW (ref 6.5–8.1)

## 2020-03-16 LAB — HEPATITIS PANEL, ACUTE
HCV Ab: NONREACTIVE
Hep A IgM: NONREACTIVE
Hep B C IgM: NONREACTIVE
Hepatitis B Surface Ag: NONREACTIVE

## 2020-03-16 LAB — PHOSPHORUS: Phosphorus: 2.4 mg/dL — ABNORMAL LOW (ref 2.5–4.6)

## 2020-03-16 LAB — MAGNESIUM: Magnesium: 1.8 mg/dL (ref 1.7–2.4)

## 2020-03-16 LAB — PROCALCITONIN: Procalcitonin: 2.52 ng/mL

## 2020-03-16 LAB — FERRITIN: Ferritin: 57 ng/mL (ref 24–336)

## 2020-03-16 MED ORDER — ENOXAPARIN SODIUM 40 MG/0.4ML ~~LOC~~ SOLN
40.0000 mg | SUBCUTANEOUS | Status: DC
  Administered 2020-03-17 – 2020-03-20 (×4): 40 mg via SUBCUTANEOUS
  Filled 2020-03-16 (×4): qty 0.4

## 2020-03-16 NOTE — Progress Notes (Signed)
[facility] Progress Note  Cory Hansen 86 y.o. [birth year]  CC:  Transaminitis  Subjective: Patient states his leg feels weak. Denies any gastrointestinal complaints. Denies nausea, vomiting, abdominal pain.  States his appetite is improving, had pancakes and sausage for breakfast.  ROS : Review of Systems  Cardiovascular: Negative for chest pain and palpitations.  Gastrointestinal: Negative for abdominal pain, blood in stool, constipation, diarrhea, heartburn, melena, nausea and vomiting.   Objective: Vital signs in last 24 hours: Vitals:   03/16/20 0222 03/16/20 0648  BP: (!) 117/92 (!) 143/83  Pulse: 85 95  Resp: 18 18  Temp: 98.1 F (36.7 C) (!) 97.5 F (36.4 C)  SpO2: 95% 98%    Physical Exam:  General:  Elderly, oriented, lethargic, cooperative, no distress  Head:  Normocephalic, without obvious abnormality, atraumatic  Eyes:  Anicteric sclera, EOMs intact  Lungs:   Clear to auscultation bilaterally, respirations unlabored  Heart:  Regular rate and rhythm, S1, S2 normal  Abdomen:   Soft, non-tender, non-distended, bowel sounds active all four quadrants,  no guarding or peritoneal signs  Extremities: Extremities normal, atraumatic, no  edema  Pulses: 2+ and symmetric    Lab Results: Recent Labs    03/15/20 0612 03/15/20 1636 03/16/20 0457  NA 138  --  139  K 3.2* 3.9 4.5  CL 109  --  111  CO2 19*  --  24  GLUCOSE 118*  --  116*  BUN 29*  --  20  CREATININE 1.39*  --  1.18  CALCIUM 8.4*  --  8.7*  MG  --  1.9 1.8  PHOS 2.9  --  2.4*   Recent Labs    03/15/20 0612 03/16/20 0457  AST 203* 103*  ALT 208* 160*  ALKPHOS 155* 145*  BILITOT 2.6* 1.5*  PROT 5.5* 5.3*  ALBUMIN 3.1* 3.0*   Recent Labs    03/15/20 0612 03/16/20 0457  WBC 7.9 6.8  NEUTROABS  --  4.5  HGB 12.1* 11.5*  HCT 37.2* 35.6*  MCV 94.2 95.7  PLT 177 185   No results for input(s): LABPROT, INR in the last 72 hours.    Assessment: Transaminitis: possibly  related to cholecystitis/cholelithiasis.  US revealed gallbladder sludge and wall thickening. Patient without abdominal pain.  Based on down trending LFTs and lack of abdominal pain, unlikely patient has biliary obstruction.  -LFTs trending down.  T bili 1.5/ AST 103/ ALT 160/ ALP 145 as compared toT bili 4.0/ AST 378/ ALT 294/ ALP 196 on arrival. -CK 5,854 as of 12/7 -Viral hepatitis panel negative -Ferritin, iron panel normal -Autoimmune markers, ceruloplasmin, alpha-1 anti-trypsin panel pending, though do not suspect underlying liver disease.  AKI, resolved: BUN 20/Cr 1.18 today  Plan: No further recommendations from a GI standpoint.  [facility] will sign off.  Please contact us if we can be of any further assistance during this hospital stay.  Edrick Kins PA-C 03/16/2020, 10:12 AM  Contact #  [phone]

## 2020-03-16 NOTE — Progress Notes (Signed)
Progress Note     Subjective: Patient denies abdominal pain, nausea or vomiting this AM. He ate some spaghetti yesterday and tolerated this well. He reports that decreased PO intake in the last few weeks is more from just not feeling hungry, but denies that he routinely has had pain or nausea with eating. His biggest complaint is LE weakness and pain in knees.   Objective: Vital signs in last 24 hours: Temp:  [97.5 F (36.4 C)-98.1 F (36.7 C)] 97.5 F (36.4 C) (12/09 0648) Pulse Rate:  [59-95] 95 (12/09 0648) Resp:  [16-18] 18 (12/09 0648) BP: (108-143)/(72-92) 143/83 (12/09 0648) SpO2:  [95 %-98 %] 98 % (12/09 0648) Last BM Date: 03/15/20  Intake/Output from previous day: 12/08 0701 - 12/09 0700 In: 2830.7 [P.O.:960; I.V.:1413.4; IV Piggyback:457.3] Out: 2000 [Urine:2000] Intake/Output this shift: Total I/O In: 240 [P.O.:240] Out: 0   PE: General: pleasant, WD, WN male who is laying in bed in NAD HEENT: head is normocephalic, atraumatic.  Sclera are anicteric.  PERRL.  Ears and nose without any masses or lesions.  Mouth is pink and moist Heart: regular, rate, and rhythm.  Normal s1,s2. No obvious murmurs, gallops, or rubs noted.  Palpable radial and pedal pulses bilaterally Lungs: CTAB, no wheezes, rhonchi, or rales noted.  Respiratory effort nonlabored Abd: soft, NT, negative murphy sign, ND, +BS, no masses, hernias, or organomegaly MS: all 4 extremities are symmetrical with no cyanosis, clubbing, or edema. Skin: warm and dry with no masses, lesions, or rashes Neuro: Cranial nerves 2-12 grossly intact, sensation is normal throughout Psych: A&Ox3 with an appropriate affect.   Lab Results:  Recent Labs    03/15/20 0612 03/16/20 0457  WBC 7.9 6.8  HGB 12.1* 11.5*  HCT 37.2* 35.6*  PLT 177 185   BMET Recent Labs    03/15/20 0612 03/15/20 1636 03/16/20 0457  NA 138  --  139  K 3.2* 3.9 4.5  CL 109  --  111  CO2 19*  --  24  GLUCOSE 118*  --  116*  BUN 29*   --  20  CREATININE 1.39*  --  1.18  CALCIUM 8.4*  --  8.7*   PT/INR No results for input(s): LABPROT, INR in the last 72 hours. CMP     Component Value Date/Time   NA 139 03/16/2020 0457   K 4.5 03/16/2020 0457   CL 111 03/16/2020 0457   CO2 24 03/16/2020 0457   GLUCOSE 116 (H) 03/16/2020 0457   BUN 20 03/16/2020 0457   CREATININE 1.18 03/16/2020 0457   CREATININE 1.04 03/17/2014 1343   CALCIUM 8.7 (L) 03/16/2020 0457   PROT 5.3 (L) 03/16/2020 0457   ALBUMIN 3.0 (L) 03/16/2020 0457   AST 103 (H) 03/16/2020 0457   ALT 160 (H) 03/16/2020 0457   ALKPHOS 145 (H) 03/16/2020 0457   BILITOT 1.5 (H) 03/16/2020 0457   GFRNONAA 56 (L) 03/16/2020 0457   GFRNONAA 62 03/17/2014 1343   GFRAA 72 03/17/2014 1343   Lipase     Component Value Date/Time   LIPASE 27 03/14/2020 2100       Studies/Results: CT Head Wo Contrast  Result Date: 03/14/2020 CLINICAL DATA:  Delirium, weakness, slurred speech, hallucinations, altered mental status, possible UTI EXAM: CT HEAD WITHOUT CONTRAST TECHNIQUE: Contiguous axial images were obtained from the base of the skull through the vertex without intravenous contrast. Sagittal and coronal MPR images reconstructed from axial data set. COMPARISON:  None FINDINGS: Brain: Generalized atrophy. Normal ventricular  morphology. No midline shift or mass effect. Small vessel chronic ischemic changes of deep cerebral white matter. No intracranial hemorrhage, mass lesion, evidence of acute infarction, or extra-axial fluid collection. Vascular: Atherosclerotic calcification of internal carotid and vertebral arteries at skull base Skull: Demineralized but intact Sinuses/Orbits: Clear Other: N/A IMPRESSION: Atrophy with small vessel chronic ischemic changes of deep cerebral white matter. No acute intracranial abnormalities. Electronically Signed   By: Ulyses Southward M.D.   On: 03/14/2020 18:55   US Abdomen Limited  Result Date: 03/14/2020 CLINICAL DATA:  Transaminitis EXAM:  ULTRASOUND ABDOMEN LIMITED RIGHT UPPER QUADRANT COMPARISON:  None. FINDINGS: Gallbladder: There is gallbladder sludge with mild gallbladder wall thickening. The gallbladder wall measures up to approximately 3.4 mm. The sonographic Eulah Pont sign is negative. The gallbladder is distended. Common bile duct: Diameter: 7 mm. Liver: No focal lesion identified. Within normal limits in parenchymal echogenicity. Portal vein is patent on color Doppler imaging with normal direction of blood flow towards the liver. Other: None. IMPRESSION: 1. Distended gallbladder with gallbladder sludge and mild gallbladder wall thickening. However, the sonographic Eulah Pont sign is reported as negative. As such, findings are equivocal for acute cholecystitis. Consider HIDA scan for further evaluation as clinically indicated. 2. The common bile duct measures at the upper limits of normal. Electronically Signed   By: Katherine Mantle M.D.   On: 03/14/2020 18:50    Anti-infectives: Anti-infectives (From admission, onward)   Start     Dose/Rate Route Frequency Ordered Stop   03/15/20 0500  piperacillin-tazobactam (ZOSYN) IVPB 3.375 g  Status:  Discontinued        3.375 g 12.5 mL/hr over 240 Minutes Intravenous Every 8 hours 03/14/20 2121 03/15/20 2025   03/14/20 2100  piperacillin-tazobactam (ZOSYN) IVPB 3.375 g        3.375 g 100 mL/hr over 30 Minutes Intravenous  Once 03/14/20 2052 03/14/20 2200       Assessment/Plan Arthritis Chronic vision problems HLD hx of nephrolithiasis Dehydration - Cr improving, CK was >5K on admission  Elevated LFTs and total bilirubin - LFTs all continuing to trend down - patient completely non-tender on exam and has imaging that is equivocal for cholecystitis  - WBC continues to trend down and patient is afebrile - I do not have a strong suspicion for cholecystitis at this time, more likely patient could have passed a gallstone or could have some underlying liver dysfunction - GI saw  yesterday and felt liver disease unlikely but labs pending  At this time patient is tolerating PO intake without pain/nausea/emesis - I would not recommend surgical intervention at this time. If patient develops symptoms consistent with gallbladder disease, can reconsult but we will sign off today. I don't think patient needs further abx from an abdominal standpoint. I discussed this with patient's daughter, [name], as well.   FEN: SOFT, IVF VTE: SCDs, lovenox ID: [id number] 12/7>>  LOS: 2 days    Juliet Rude , [facility] Surgery 03/16/2020, 9:28 AM Please see Amion for pager number during day hours 7:00am-4:30pm

## 2020-03-16 NOTE — Progress Notes (Signed)
Patient's daughter called this evening and was upset about her father's possible discharge tomorrow, 12/10.  She said they cannot take care of him at home anymore and need to discuss with someone what to do before he is discharged.

## 2020-03-16 NOTE — Progress Notes (Addendum)
PROGRESS NOTE    Cory Hansen  ATF:[id number] DOB: 1921-03-02 DOA: 03/14/2020 PCP: [name], MD     Brief Narrative:  Cory Hansen is a 86 y.o. WM PMHx osteoarthritis, nephrolithiasis,chronic low back pain, HLD,  Presents to the ER with abdominal pain dark urine weakness and some slurred speech since yesterday.  He lives alone at home but daughter has been checking in on him repeatedly.  She found him in the extubation today.  She was worried about his condition and brought him to the ER thinking he may have UTI.  Patient was seen and evaluated in the ER.  He has elevated liver function test with abdominal pain and right upper quadrant abdominal tenderness.  Also found to be in acute kidney injury hypokalemia.  Ultrasound of the abdomen confirmed sludge with gallbladder thickening consistent with possible acute cholecystitis.  Surgery consulted and recommends admission to medical service for presumed acute cholecystitis and surgery will follow.  Patient has some nausea but no vomiting.  Has not had any diarrhea.  Patient will be admitted therefore for further evaluation and treatment..  ED Course: Temperature 97.9 blood pressure 95/62 pulse 87 respiratory rate of 18 oxygen sat 178.  White count is 13.3 hemoglobin 13.2 and platelets 214.  Sodium 137 potassium 3.3 chloride 105.  CO2 of 18 BUN 33 creatinine 2.03 and calcium 9.0 glucose 106.  Viral screen including COVID-19 is negative ultrasound abdomen showed distended gallbladder negative Murphy sign but telltale sign of cholecystitis.  Ammonia is 47 AST 378 ALT 294 total bilirubin 4.0 with GFR of 29.  Alkaline phosphatase 196.  Head CT without contrast is negative.  Based on patient's finding surgery consulted and recommends admission for possible surgical evaluation and treatment    Subjective: 12/9 afebrile overnight A/O x4, negative abdominal pain, negative CP.  Patient states he just does not have an appetite to eat and  drink anymore.  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  In very good spirits eager to go home.   Assessment & Plan: Covid vaccination; vaccinated   Principal Problem:   Acute cholecystitis Active Problems:   Hyperlipidemia   Hx of benign neoplasm of prostate   AKI (acute kidney injury) (HCC)   Hypokalemia   acute cholecystitis with cholelithiasis:  -Liver WNL by [place] -See elevated LFT -12/9 CCS signed off case from their standpoint believe patient is safe for discharge.  This was discussed with daughter [name] per CTS note  Elevated LFTs -Evaluated by surgery and GI -Conservative management.  Not cholecystitis or cholelithiasis. -12/9 GI signed off case do not suspect underlying liver disease.  weakness with slurred speech:  -Negative slurred speech -12/9 consult PT/OT  AKI: Most likely prerenal.   Lab Results  Component Value Date   CREATININE 1.18 03/16/2020   CREATININE 1.39 (H) 03/15/2020   CREATININE 2.03 (H) 03/14/2020   CREATININE 1.05 05/07/2019   CREATININE 1.04 10/31/2017  -Resolving  HLD -Lipid panel pending   Hypokalemia -Potassium goal> 4 -Potassium 50 mEq -Recheck K/Mg @ 1700  Goals of care ADDENDUM 12/10 upon arrival this a.m. there is a secure chat from last night RN stating daughter states family cannot take care of father and therefore is upset that he is going to be discharged on 12/10 even though on 12/9 daughter was spoken to by myself, GI, CCS and assured patient safe for discharge.   DVT prophylaxis: Lovenox Code Status:  Full Family Communication: 12/9 daughter at bedside for discussion of plan of care answered all questions Status is: Inpatient    Dispo: The patient is from: Home              Anticipated d/c is to: Home              Anticipated d/c date is: 12/10              Patient currently unstable      Consultants:    Procedures/Significant Events:    I  have personally reviewed and interpreted all radiology studies and my findings are as above.  VENTILATOR SETTINGS:    Cultures   Antimicrobials:   Devices    LINES / TUBES:      Continuous Infusions: . dextrose 5% lactated ringers with KCl 20 mEq/L 125 mL/hr at 03/16/20 0920     Objective: Vitals:   03/16/20 0222 03/16/20 0648 03/16/20 1104 03/16/20 1316  BP: (!) 117/92 (!) 143/83 111/66 115/76  Pulse: 85 95 69 88  Resp: Temp: 98.1 F (36.7 C) (!) 97.5 F (36.4 C) 98.1 F (36.7 C) (!) 97.4 F (36.3 C)  TempSrc: Oral Oral  Oral  SpO2: 95% 98% 96% 97%  Weight:      Height:        Intake/Output Summary (Last 24 hours) at 03/16/2020 1351 Last data filed at 03/16/2020 1324 Gross per 24 hour  Intake 2003.39 ml  Output 2450 ml  Net -446.61 ml   Filed Weights   03/14/20 1555  Weight: 81.6 kg    Examination:  General: A/O x4, No acute respiratory distress Eyes: negative scleral hemorrhage, negative anisocoria, negative icterus ENT: Negative Runny nose, negative gingival bleeding, Neck:  Negative scars, masses, torticollis, lymphadenopathy, JVD Lungs: Clear to auscultation bilaterally without wheezes or crackles Cardiovascular: Regular rate and rhythm without murmur gallop or rub normal S1 and S2 Abdomen: negative abdominal pain, nondistended, positive soft, bowel sounds, no rebound, no ascites, no appreciable mass Extremities: No significant cyanosis, clubbing, or edema bilateral lower extremities Skin: Negative rashes, lesions, ulcers Psychiatric:  Negative depression, negative anxiety, negative fatigue, negative mania  Central nervous system:  Cranial nerves II through XII intact, tongue/uvula midline, all extremities muscle strength 5/5, sensation intact throughout, negative dysarthria, negative expressive aphasia, negative receptive aphasia.  .     Data Reviewed: Care during the described time interval was provided by me .  I have reviewed  this patient's available data, including medical history, events of note, physical examination, and all test results as part of my evaluation.  CBC: Recent Labs  Lab 03/14/20 1616 03/15/20 0612 03/16/20 0457  WBC 13.3* 7.9 6.8  NEUTROABS  --   --  4.5  HGB 13.2 12.1* 11.5*  HCT 40.6 37.2* 35.6*  MCV 96.4 94.2 95.7  PLT 214 177 185   Basic Metabolic Panel: Recent Labs  Lab 03/14/20 1616 03/15/20 0612 03/15/20 1636 03/16/20 0457  NA 137 138  --  139  K 3.3* 3.2* 3.9 4.5  CL 105 109  --  111  CO2 18* 19*  --  24  GLUCOSE 106* 118*  --  116*  BUN 33* 29*  --  20  CREATININE 2.03* 1.39*  --  1.18  CALCIUM 9.0 8.4*  --  8.7*  MG  --   --  1.9 1.8  PHOS  --  2.9  --  2.4*   GFR: Estimated Creatinine Clearance: 38.1  mL/min (by C-G formula based on SCr of 1.18 mg/dL). Liver Function Tests: Recent Labs  Lab 03/14/20 1616 03/15/20 0612 03/16/20 0457  AST 378* 203* 103*  ALT 294* 208* 160*  ALKPHOS 196* 155* 145*  BILITOT 4.0* 2.6* 1.5*  PROT 6.9 5.5* 5.3*  ALBUMIN 4.0 3.1* 3.0*   Recent Labs  Lab 03/14/20 2100  LIPASE 27   Recent Labs  Lab 03/14/20 2100  AMMONIA 47*   Coagulation Profile: No results for input(s): INR, PROTIME in the last 168 hours. Cardiac Enzymes: Recent Labs  Lab 03/14/20 2100  CKTOTAL 5,854*   BNP (last 3 results) No results for input(s): PROBNP in the last 8760 hours. HbA1C: No results for input(s): HGBA1C in the last 72 hours. CBG: Recent Labs  Lab 03/14/20 1605 03/15/20 2033  GLUCAP 94 114*   Lipid Profile: Recent Labs    03/16/20 0457  CHOL 126  HDL 37*  LDLCALC 81  TRIG 41  CHOLHDL 3.4   Thyroid Function Tests: No results for input(s): TSH, T4TOTAL, FREET4, T3FREE, THYROIDAB in the last 72 hours. Anemia Panel: Recent Labs    03/16/20 0457  FERRITIN 57  TIBC 285  IRON 60   Sepsis Labs: Recent Labs  Lab 03/15/20 1636 03/16/20 0457  PROCALCITON 3.63 2.52    Recent Results (from the past 240 hour(s))   Group A Strep by PCR     Status: None   Collection Time: 03/08/20 10:04 AM   Specimen: Throat; Sterile Swab  Result Value Ref Range Status   Group A Strep by PCR NOT DETECTED NOT DETECTED Final    Comment: Performed at [facility], [address] [place], [place] [address]  Resp Panel by RT-PCR (Flu A&B, Covid) Nasopharyngeal Swab     Status: None   Collection Time: 03/08/20  1:17 PM   Specimen: Nasopharyngeal Swab; Nasopharyngeal(NP) swabs in vial transport medium  Result Value Ref Range Status   SARS Coronavirus 2 by RT PCR NEGATIVE NEGATIVE Final    Comment: (NOTE) SARS-CoV-2 target nucleic acids are NOT DETECTED.  The SARS-CoV-2 RNA is generally detectable in upper respiratory specimens during the acute phase of infection. The lowest concentration of SARS-CoV-2 viral copies this assay can detect is 138 copies/mL. A negative result does not preclude SARS-Cov-2 infection and should not be used as the sole basis for treatment or other patient management decisions. A negative result may occur with  improper specimen collection/handling, submission of specimen other than nasopharyngeal swab, presence of viral mutation(s) within the areas targeted by this assay, and inadequate number of viral copies(<138 copies/mL). A negative result must be combined with clinical observations, patient history, and epidemiological information. The expected result is Negative.  Fact Sheet for Patients:  BloggerCourse.com  Fact Sheet for Healthcare Providers:  SeriousBroker.it  This test is no t yet approved or cleared by the [place] FDA and  has been authorized for detection and/or diagnosis of SARS-CoV-2 by FDA under an Emergency Use Authorization (EUA). This EUA will remain  in effect (meaning this test can be used) for the duration of the COVID-19 declaration under Section 564(b)(1) of the Act, 21 U.S.C.section 360bbb-3(b)(1),  unless the authorization is terminated  or revoked sooner.       Influenza A by PCR NEGATIVE NEGATIVE Final   Influenza B by PCR NEGATIVE NEGATIVE Final    Comment: (NOTE) The Xpert Xpress SARS-CoV-2/FLU/RSV plus assay is intended as an aid in the diagnosis of influenza from Nasopharyngeal swab specimens and  should not be used as a sole basis for treatment. Nasal washings and aspirates are unacceptable for Xpert Xpress SARS-CoV-2/FLU/RSV testing.  Fact Sheet for Patients: BloggerCourse.comhttps://www.fda.gov/media/152166/download  Fact Sheet for Healthcare Providers: SeriousBroker.ithttps://www.fda.gov/media/152162/download  This test is not yet approved or cleared by the [place] FDA and has been authorized for detection and/or diagnosis of SARS-CoV-2 by FDA under an Emergency Use Authorization (EUA). This EUA will remain in effect (meaning this test can be used) for the duration of the COVID-19 declaration under Section 564(b)(1) of the Act, 21 U.S.C. section 360bbb-3(b)(1), unless the authorization is terminated or revoked.  Performed at [facility], [address] [place], [place] [address]   Resp Panel by RT-PCR (Flu A&B, Covid) Nasopharyngeal Swab     Status: None   Collection Time: 03/14/20  6:22 PM   Specimen: Nasopharyngeal Swab; Nasopharyngeal(NP) swabs in vial transport medium  Result Value Ref Range Status   SARS Coronavirus 2 by RT PCR NEGATIVE NEGATIVE Final    Comment: (NOTE) SARS-CoV-2 target nucleic acids are NOT DETECTED.  The SARS-CoV-2 RNA is generally detectable in upper respiratory specimens during the acute phase of infection. The lowest concentration of SARS-CoV-2 viral copies this assay can detect is 138 copies/mL. A negative result does not preclude SARS-Cov-2 infection and should not be used as the sole basis for treatment or other patient management decisions. A negative result may occur with  improper specimen collection/handling, submission of specimen  other than nasopharyngeal swab, presence of viral mutation(s) within the areas targeted by this assay, and inadequate number of viral copies(<138 copies/mL). A negative result must be combined with clinical observations, patient history, and epidemiological information. The expected result is Negative.  Fact Sheet for Patients:  BloggerCourse.comhttps://www.fda.gov/media/152166/download  Fact Sheet for Healthcare Providers:  SeriousBroker.ithttps://www.fda.gov/media/152162/download  This test is no t yet approved or cleared by the [place] FDA and  has been authorized for detection and/or diagnosis of SARS-CoV-2 by FDA under an Emergency Use Authorization (EUA). This EUA will remain  in effect (meaning this test can be used) for the duration of the COVID-19 declaration under Section 564(b)(1) of the Act, 21 U.S.C.section 360bbb-3(b)(1), unless the authorization is terminated  or revoked sooner.       Influenza A by PCR NEGATIVE NEGATIVE Final   Influenza B by PCR NEGATIVE NEGATIVE Final    Comment: (NOTE) The Xpert Xpress SARS-CoV-2/FLU/RSV plus assay is intended as an aid in the diagnosis of influenza from Nasopharyngeal swab specimens and should not be used as a sole basis for treatment. Nasal washings and aspirates are unacceptable for Xpert Xpress SARS-CoV-2/FLU/RSV testing.  Fact Sheet for Patients: BloggerCourse.comhttps://www.fda.gov/media/152166/download  Fact Sheet for Healthcare Providers: SeriousBroker.ithttps://www.fda.gov/media/152162/download  This test is not yet approved or cleared by the [place] FDA and has been authorized for detection and/or diagnosis of SARS-CoV-2 by FDA under an Emergency Use Authorization (EUA). This EUA will remain in effect (meaning this test can be used) for the duration of the COVID-19 declaration under Section 564(b)(1) of the Act, 21 U.S.C. section 360bbb-3(b)(1), unless the authorization is terminated or revoked.  Performed at [facility], [address] [place], [place] [address]          Radiology Studies: CT Head Wo Contrast  Result Date: 03/14/2020 CLINICAL DATA:  Delirium, weakness, slurred speech, hallucinations, altered mental status, possible UTI EXAM: CT HEAD WITHOUT CONTRAST TECHNIQUE: Contiguous axial images were obtained from the base of the skull through the vertex without intravenous contrast. Sagittal and  coronal MPR images reconstructed from axial data set. COMPARISON:  None FINDINGS: Brain: Generalized atrophy. Normal ventricular morphology. No midline shift or mass effect. Small vessel chronic ischemic changes of deep cerebral white matter. No intracranial hemorrhage, mass lesion, evidence of acute infarction, or extra-axial fluid collection. Vascular: Atherosclerotic calcification of internal carotid and vertebral arteries at skull base Skull: Demineralized but intact Sinuses/Orbits: Clear Other: N/A IMPRESSION: Atrophy with small vessel chronic ischemic changes of deep cerebral white matter. No acute intracranial abnormalities. Electronically Signed   By: Ulyses Southward M.D.   On: 03/14/2020 18:55   US Abdomen Limited  Result Date: 03/14/2020 CLINICAL DATA:  Transaminitis EXAM: ULTRASOUND ABDOMEN LIMITED RIGHT UPPER QUADRANT COMPARISON:  None. FINDINGS: Gallbladder: There is gallbladder sludge with mild gallbladder wall thickening. The gallbladder wall measures up to approximately 3.4 mm. The sonographic Eulah Pont sign is negative. The gallbladder is distended. Common bile duct: Diameter: 7 mm. Liver: No focal lesion identified. Within normal limits in parenchymal echogenicity. Portal vein is patent on color Doppler imaging with normal direction of blood flow towards the liver. Other: None. IMPRESSION: 1. Distended gallbladder with gallbladder sludge and mild gallbladder wall thickening. However, the sonographic Eulah Pont sign is reported as negative. As such, findings are equivocal for acute cholecystitis. Consider HIDA scan for further  evaluation as clinically indicated. 2. The common bile duct measures at the upper limits of normal. Electronically Signed   By: Katherine Mantle M.D.   On: 03/14/2020 18:50        Scheduled Meds: . [START ON 03/17/2020] enoxaparin (LOVENOX) injection  40 mg Subcutaneous Q24H  . fluticasone  1 spray Each Nare Daily   Continuous Infusions: . dextrose 5% lactated ringers with KCl 20 mEq/L 125 mL/hr at 03/16/20 0920     LOS: 2 days    Time spent:40 min    Zen Cedillos, Roselind Messier, MD [facility] Pager [phone]  If 7PM-7AM, please contact night-coverage [email or web address] Password [email or web address] 03/16/2020, 1:51 PM

## 2020-03-17 LAB — CBC WITH DIFFERENTIAL/PLATELET
Abs Immature Granulocytes: 0.02 10*3/uL (ref 0.00–0.07)
Basophils Absolute: 0 10*3/uL (ref 0.0–0.1)
Basophils Relative: 1 %
Eosinophils Absolute: 0.3 10*3/uL (ref 0.0–0.5)
Eosinophils Relative: 3 %
HCT: 38.4 % — ABNORMAL LOW (ref 39.0–52.0)
Hemoglobin: 12.3 g/dL — ABNORMAL LOW (ref 13.0–17.0)
Immature Granulocytes: 0 %
Lymphocytes Relative: 19 %
Lymphs Abs: 1.4 10*3/uL (ref 0.7–4.0)
MCH: 31.2 pg (ref 26.0–34.0)
MCHC: 32 g/dL (ref 30.0–36.0)
MCV: 97.5 fL (ref 80.0–100.0)
Monocytes Absolute: 1 10*3/uL (ref 0.1–1.0)
Monocytes Relative: 13 %
Neutro Abs: 4.9 10*3/uL (ref 1.7–7.7)
Neutrophils Relative %: 64 %
Platelets: 190 10*3/uL (ref 150–400)
RBC: 3.94 MIL/uL — ABNORMAL LOW (ref 4.22–5.81)
RDW: 13.6 % (ref 11.5–15.5)
WBC: 7.6 10*3/uL (ref 4.0–10.5)
nRBC: 0 % (ref 0.0–0.2)

## 2020-03-17 LAB — COMPREHENSIVE METABOLIC PANEL
ALT: 126 U/L — ABNORMAL HIGH (ref 0–44)
AST: 60 U/L — ABNORMAL HIGH (ref 15–41)
Albumin: 3.1 g/dL — ABNORMAL LOW (ref 3.5–5.0)
Alkaline Phosphatase: 155 U/L — ABNORMAL HIGH (ref 38–126)
Anion gap: 9 (ref 5–15)
BUN: 13 mg/dL (ref 8–23)
CO2: 22 mmol/L (ref 22–32)
Calcium: 8.8 mg/dL — ABNORMAL LOW (ref 8.9–10.3)
Chloride: 111 mmol/L (ref 98–111)
Creatinine, Ser: 0.91 mg/dL (ref 0.61–1.24)
GFR, Estimated: >60 mL/min (ref >60)
Glucose, Bld: 99 mg/dL (ref 70–99)
Potassium: 3.9 mmol/L (ref 3.5–5.1)
Sodium: 142 mmol/L (ref 135–145)
Total Bilirubin: 1.5 mg/dL — ABNORMAL HIGH (ref 0.3–1.2)
Total Protein: 5.8 g/dL — ABNORMAL LOW (ref 6.5–8.1)

## 2020-03-17 LAB — MAGNESIUM: Magnesium: 1.9 mg/dL (ref 1.7–2.4)

## 2020-03-17 LAB — ANA W/REFLEX IF POSITIVE: Anti Nuclear Antibody (ANA): NEGATIVE

## 2020-03-17 LAB — ALPHA-1-ANTITRYPSIN: A-1 Antitrypsin, Ser: 157 mg/dL (ref 101–187)

## 2020-03-17 LAB — PHOSPHORUS: Phosphorus: 3.2 mg/dL (ref 2.5–4.6)

## 2020-03-17 LAB — CERULOPLASMIN: Ceruloplasmin: 22.1 mg/dL (ref 16.0–31.0)

## 2020-03-17 LAB — SARS CORONAVIRUS 2 BY RT PCR (HOSPITAL ORDER, PERFORMED IN ~~LOC~~ HOSPITAL LAB): SARS Coronavirus 2: NEGATIVE

## 2020-03-17 LAB — PROCALCITONIN: Procalcitonin: 0.96 ng/mL

## 2020-03-17 NOTE — [place] FL2 (Signed)
[place] MEDICAID FL2 LEVEL OF CARE SCREENING TOOL     IDENTIFICATION  Patient Name: Cory Hansen Birthdate: 28-Jul-1920 Sex: male Admission Date (Current Location): 03/14/2020  [facility] and [place] Number:  Producer, television/film/video and Address:  [facility],  [address] [place], [place] [address]      Provider Number: [id number]  Attending Physician Name and Address:  Drema Dallas, MD  Relative Name and Phone Number:  Ferdinand Cava (Daughter)   [phone]    Current Level of Care: Hospital Recommended Level of Care: Skilled Nursing Facility Prior Approval Number:    Date Approved/Denied:   PASRR Number: [id number]  Discharge Plan: SNF    Current Diagnoses: Patient Active Problem List   Diagnosis Date Noted  . Acute cholecystitis 03/14/2020  . AKI (acute kidney injury) (HCC) 03/14/2020  . Hypokalemia 03/14/2020  . SOB (shortness of breath) 08/19/2019  . Acute right ankle pain 05/07/2019  . Diarrhea 10/27/2015  . Urinary dribbling 07/26/2015  . Routine general medical examination at a health care facility 10/13/2014  . CT, CHEST, ABNORMAL 01/23/2009  . Chronic bilateral low back pain with right-sided sciatica 01/17/2009  . NEOPLASM, MALIGNANT, BLADDER, HX OF 01/17/2009  . Hx of benign neoplasm of prostate 01/17/2009  . Hyperlipidemia 01/16/2009  . ARTHRITIS 01/16/2009  . Weakness 01/16/2009    Orientation RESPIRATION BLADDER Height & Weight     Self,Situation,Place  Normal Incontinent Weight: 81.6 kg Height:  5\' 11"  (180.3 cm)  BEHAVIORAL SYMPTOMS/MOOD NEUROLOGICAL BOWEL NUTRITION STATUS   (none)  (none) Continent Diet (see d/c summary)  AMBULATORY STATUS COMMUNICATION OF NEEDS Skin   Extensive Assist Verbally Normal                       Personal Care Assistance Level of Assistance  Bathing,Feeding,Dressing Bathing Assistance: Limited assistance Feeding assistance: Limited assistance Dressing Assistance: Limited assistance      Functional Limitations Info  Sight,Hearing,Speech Sight Info: Adequate Hearing Info: Adequate Speech Info: Adequate    SPECIAL CARE FACTORS FREQUENCY  PT (By licensed PT),OT (By licensed OT)     PT Frequency: 5X/W OT Frequency: 5X/W            Contractures Contractures Info: Not present    Additional Factors Info  Code Status,Allergies Code Status Info: full Allergies Info: nka           Current Medications (03/17/2020):  This is the current hospital active medication list Current Facility-Administered Medications  Medication Dose Route Frequency Provider Last Rate Last Admin  . dextrose 5% in lactated ringers with KCl 20 mEq/L infusion   Intravenous Continuous 14/01/2020, MD 125 mL/hr at 03/17/20 1000 Infusion Verify at 03/17/20 1000  . enoxaparin (LOVENOX) injection 40 mg  40 mg Subcutaneous Q24H 14/10/21, RPH   40 mg at 03/17/20 1042  . fluticasone (FLONASE) 50 MCG/ACT nasal spray 1 spray  1 spray Each Nare Daily 14/10/21, MD   1 spray at 03/16/20 0922  . morphine 2 MG/ML injection 2 mg  2 mg Intravenous Q2H PRN 14/09/21 L, MD      . ondansetron (ZOFRAN) tablet 4 mg  4 mg Oral Q6H PRN Earlie Lou, MD       Or  . ondansetron (ZOFRAN) injection 4 mg  4 mg Intravenous Q6H PRN Rometta Emery, MD         Discharge Medications: Please see discharge summary for a list of discharge medications.  Relevant Imaging Results:  Relevant Lab Results:   Additional Information [phone] [place], [place]

## 2020-03-17 NOTE — Evaluation (Signed)
Physical Therapy Evaluation Patient Details Name: Cory Hansen MRN: [id number] DOB: December 18, 1920 Today's Date: 03/17/2020   History of Present Illness  Patient is a 86 year old male PMH of osteoarthritis and kidney stones, chronic low back pain and hyperlipidemia who presents to the ER with abdominal pain, dark urine, weakness. Admitted with acute cholecystitis.  Clinical Impression  Pt admitted with above diagnosis.  Pt weaker than baseline, lives alone. Recommend SNF, pt dtr reports the pt is agreeable  Pt currently with functional limitations due to the deficits listed below (see PT Problem List). Pt will benefit from skilled PT to increase their independence and safety with mobility to allow discharge to the venue listed below.       Follow Up Recommendations SNF    Equipment Recommendations  None recommended by PT    Recommendations for Other Services       Precautions / Restrictions Precautions Precautions: Fall Precaution Comments: patient reports 2 falls in past 6 months      Mobility  Bed Mobility Overal bed mobility: Needs Assistance Bed Mobility: Supine to Sit;Sit to Supine     Supine to sit: Supervision Sit to supine: Supervision   General bed mobility comments: incr time, HOB at 30 degrees, supervision to manage line and safely elevate trunk    Transfers Overall transfer level: Needs assistance Equipment used: Rolling walker (2 wheeled)     Stand pivot transfers: Min assist       General transfer comment: cues for hand placement and wt shift, overall safety. assist to rise and control descent  Ambulation/Gait             General Gait Details: lateral steps along EOB with RW and min assist for balance and safety  Stairs            Wheelchair Mobility    Modified Rankin (Stroke Patients Only)       Balance                                             Pertinent Vitals/Pain Pain Assessment: No/denies pain     Home Living Family/patient expects to be discharged to:: Private residence Living Arrangements: Alone Available Help at Discharge: Family;Available PRN/intermittently Type of Home: House Home Access: Ramped entrance     Home Layout: One level;Other (Comment) (does not have  to go down  basement) Home Equipment: Hand held shower head;Shower seat;Grab bars - tub/shower;Walker - 2 wheels;Bedside commode;Wheelchair - manual;Hospital bed      Prior Function Level of Independence: Needs assistance   Gait / Transfers Assistance Needed: patient reports ~6 months using wheelchair more vs walker           Hand Dominance        Extremity/Trunk Assessment   Upper Extremity Assessment Upper Extremity Assessment: Generalized weakness    Lower Extremity Assessment Lower Extremity Assessment: Generalized weakness (pt reports "Knees bad")       Communication   Communication: HOH  Cognition Arousal/Alertness: Awake/alert Behavior During Therapy: WFL for tasks assessed/performed Overall Cognitive Status: Within Functional Limits for tasks assessed                                 General Comments: oriented to place, self. tangential at times but very pleasant and cooperative, follows commands consistently  General Comments      Exercises     Assessment/Plan    PT Assessment Patient needs continued PT services  PT Problem List Decreased strength;Decreased range of motion;Decreased activity tolerance;Decreased balance;Decreased mobility;Decreased knowledge of use of DME       PT Treatment Interventions DME instruction;Therapeutic activities;Gait training;Functional mobility training;Therapeutic exercise;Patient/family education;Balance training    PT Goals (Current goals can be found in the Care Plan section)  Acute Rehab PT Goals Patient Stated Goal: agreeable to rehab PT Goal Formulation: With patient Time For Goal Achievement: 03/31/20 Potential  to Achieve Goals: Good    Frequency Min 2X/week   Barriers to discharge        Co-evaluation               AM-PAC PT "6 Clicks" Mobility  Outcome Measure Help needed turning from your back to your side while in a flat bed without using bedrails?: None Help needed moving from lying on your back to sitting on the side of a flat bed without using bedrails?: None Help needed moving to and from a bed to a chair (including a wheelchair)?: A Little Help needed standing up from a chair using your arms (e.g., wheelchair or bedside chair)?: A Little Help needed to walk in hospital room?: A Lot Help needed climbing 3-5 steps with a railing? : Total 6 Click Score: 17    End of Session Equipment Utilized During Treatment: Gait belt Activity Tolerance: Patient tolerated treatment well Patient left: with call bell/phone within reach;in bed;with bed alarm set;with family/visitor present   PT Visit Diagnosis: Difficulty in walking, not elsewhere classified (R26.2);Other abnormalities of gait and mobility (R26.89);Muscle weakness (generalized) (M62.81)    Time: 1530-1550 PT Time Calculation (min) (ACUTE ONLY): 20 min   Charges:   PT Evaluation $PT Eval Low Complexity: 1 Low          Marteze Vecchio, PT  Acute Rehab Dept ([facility]) [phone] Pager [phone]  03/17/2020   [facility] 03/17/2020, 4:12 PM

## 2020-03-17 NOTE — Evaluation (Signed)
Occupational Therapy Evaluation Patient Details Name: Cory Hansen MRN: [id number] DOB: 02/05/1921 Today's Date: 03/17/2020    History of Present Illness Patient is a 86 year old male PMH of osteoarthritis and kidney stones, chronic low back pain and hyperlipidemia who presents to the ER with abdominal pain, dark urine, weakness. Admitted with acute cholecystitis.   Clinical Impression   Patient lives home alone in [facility] with ramp access. Patient reports due to knees "giving out" for past 6 months or so he has been using a wheelchair to get around the house/transfer. One DTR lives a few houses away and 3 others live  "in town" to check in on him daily and provide assist with IADLs and bathing, otherwise patient I with ADLs. Currently patient is min A for pivot to recline with cues for safe hand placement. If patient could transition to living with one of his DTRs would recommend [facility], however if family unable to provide assist level/supervison then short term SNF to try and improve patient safety/transfers and independence with ADLs.     Follow Up Recommendations  Home health OT;Supervision/Assistance - 24 hour;Other (comment) (vs SNF if unable to stay with DTRs)    Equipment Recommendations  None recommended by OT       Precautions / Restrictions Precautions Precautions: Fall Precaution Comments: patient reports 2 falls in past 6 months Restrictions Weight Bearing Restrictions: No      Mobility Bed Mobility Overal bed mobility: Modified Independent             General bed mobility comments: increased time and HOB elevated, patient has hospital bed at home    Transfers Overall transfer level: Needs assistance Equipment used:  (gait belt) Transfers: Stand Pivot Transfers   Stand pivot transfers: Min assist       General transfer comment: please see toilet transfer in ADL section; appears as fall risk requiring cues for sequencing/hand placement    Balance Overall  balance assessment: History of Falls;Needs assistance Sitting-balance support: Feet supported Sitting balance-Leahy Scale: Fair     Standing balance support: Single extremity supported Standing balance-Leahy Scale: Poor Standing balance comment: UE support on chair arm and min A from OT                           ADL either performed or assessed with clinical judgement   ADL Overall ADL's : Needs assistance/impaired     Grooming: Wash/dry face;Wash/dry hands;Set up;Bed level   Upper Body Bathing: Supervision/ safety;Sitting;Set up   Lower Body Bathing: Moderate assistance;Sitting/lateral leans;Sit to/from stand   Upper Body Dressing : Set up;Sitting;Supervision/safety   Lower Body Dressing: Moderate assistance;Sitting/lateral leans;Sit to/from stand   Toilet Transfer: Minimal assistance;Stand-pivot;Cueing for safety;Cueing for sequencing;BSC Toilet Transfer Details (indicate cue type and reason): to recliner, cued patient to reach for far arm rest to complete pivot transfer safely. min A holding gait belt as patient mildly tremulous and reports his knees will just give out. Toileting- Clothing Manipulation and Hygiene: Moderate assistance;Sitting/lateral lean;Sit to/from stand       Functional mobility during ADLs: Minimal assistance (stand pivot) General ADL Comments: patient requiring increased assistance with self care due to decreased activity tolerance, balance, safety awareness, global strength     Vision Baseline Vision/History: Cataracts Patient Visual Report: Other (comment) (L vision > R, sees shadows in R eye, no longer wears glasses "I see the same with or without them on now")  Pertinent Vitals/Pain Pain Assessment: No/denies pain     Hand Dominance Right   Extremity/Trunk Assessment Upper Extremity Assessment Upper Extremity Assessment: Generalized weakness   Lower Extremity Assessment Lower Extremity Assessment: Defer to PT  evaluation       Communication Communication Communication: HOH   Cognition Arousal/Alertness: Awake/alert Behavior During Therapy: WFL for tasks assessed/performed Overall Cognitive Status: No family/caregiver present to determine baseline cognitive functioning                                 General Comments: patient states does not look at calendar/keep up with "that stuff" new it was either Nov or Dec, disoriented to year. Knows name of hospital, name of president. Patient following directions appropriately, very pleasant and somewhat tangential              Home Living Family/patient expects to be discharged to:: Private residence Living Arrangements: Alone Available Help at Discharge: Family;Available PRN/intermittently Type of Home: House Home Access: Ramped entrance     Home Layout: One level;Other (Comment) (has basement but does not use)     Bathroom Shower/Tub: Chief Strategy Officer: Handicapped height     Home Equipment: Hand held shower head;Shower seat;Grab bars - tub/shower;Walker - 2 wheels;Bedside commode;Wheelchair - manual;Hospital bed          Prior Functioning/Environment Level of Independence: Needs assistance  Gait / Transfers Assistance Needed: patient reports ~6 months using wheelchair more vs walker ADL's / Homemaking Assistance Needed: patient reports I with dressing, toileting, DTRs help him wash his back, neck, bottom of his feet. DTRs do IADLs meal prep, cleaning, laundry, grocery shopping            OT Problem List: Decreased activity tolerance;Decreased strength;Impaired balance (sitting and/or standing);Decreased safety awareness      OT Treatment/Interventions: Self-care/ADL training;Therapeutic activities;DME and/or AE instruction;Balance training;Patient/family education    OT Goals(Current goals can be found in the care plan section) Acute Rehab OT Goals Patient Stated Goal: agreeable to get up to  chair OT Goal Formulation: With patient Time For Goal Achievement: 03/31/20 Potential to Achieve Goals: Good  OT Frequency: Min 2X/week    AM-PAC OT "6 Clicks" Daily Activity     Outcome Measure Help from another person eating meals?: A Little Help from another person taking care of personal grooming?: A Little Help from another person toileting, which includes using toliet, bedpan, or urinal?: A Lot Help from another person bathing (including washing, rinsing, drying)?: A Lot Help from another person to put on and taking off regular upper body clothing?: A Little Help from another person to put on and taking off regular lower body clothing?: A Lot 6 Click Score: 15   End of Session Equipment Utilized During Treatment: Gait belt Nurse Communication: Mobility status  Activity Tolerance: Patient tolerated treatment well Patient left: in chair;with call bell/phone within reach;with chair alarm set  OT Visit Diagnosis: Other abnormalities of gait and mobility (R26.89);Muscle weakness (generalized) (M62.81)                Time: 6967-8938 OT Time Calculation (min): 43 min Charges:  OT General Charges $OT Visit: 1 Visit OT Evaluation $OT Eval Low Complexity: 1 Low OT Treatments $Self Care/Home Management : 23-37 mins  Marlyce Huge OT OT pager: [phone]  Carmelia Roller 03/17/2020, 10:58 AM

## 2020-03-17 NOTE — Progress Notes (Signed)
Pt has a small skin tear in the buttock crack. Cream was applied. Alleyn foam was also applied.

## 2020-03-17 NOTE — Progress Notes (Signed)
PROGRESS NOTE    Cory Hansen  WUJ:[id number] DOB: 1920-04-27 DOA: 03/14/2020 PCP: [name], MD     Brief Narrative:  Cory Hansen is a 86 y.o. WM PMHx osteoarthritis, nephrolithiasis,chronic low back pain, HLD,  Presents to the ER with abdominal pain dark urine weakness and some slurred speech since yesterday.  He lives alone at home but daughter has been checking in on him repeatedly.  She found him in the extubation today.  She was worried about his condition and brought him to the ER thinking he may have UTI.  Patient was seen and evaluated in the ER.  He has elevated liver function test with abdominal pain and right upper quadrant abdominal tenderness.  Also found to be in acute kidney injury hypokalemia.  Ultrasound of the abdomen confirmed sludge with gallbladder thickening consistent with possible acute cholecystitis.  Surgery consulted and recommends admission to medical service for presumed acute cholecystitis and surgery will follow.  Patient has some nausea but no vomiting.  Has not had any diarrhea.  Patient will be admitted therefore for further evaluation and treatment..  ED Course: Temperature 97.9 blood pressure 95/62 pulse 87 respiratory rate of 18 oxygen sat 178.  White count is 13.3 hemoglobin 13.2 and platelets 214.  Sodium 137 potassium 3.3 chloride 105.  CO2 of 18 BUN 33 creatinine 2.03 and calcium 9.0 glucose 106.  Viral screen including COVID-19 is negative ultrasound abdomen showed distended gallbladder negative Murphy sign but telltale sign of cholecystitis.  Ammonia is 47 AST 378 ALT 294 total bilirubin 4.0 with GFR of 29.  Alkaline phosphatase 196.  Head CT without contrast is negative.  Based on patient's finding surgery consulted and recommends admission for possible surgical evaluation and treatment    Subjective: 12/10 afebrile overnight A/O x4, negative abdominal pain, negative CP. States he agrees with his daughters to go to SNF for as long as  required.    Assessment & Plan: Covid vaccination; vaccinated   Principal Problem:   Acute cholecystitis Active Problems:   Hyperlipidemia   Hx of benign neoplasm of prostate   AKI (acute kidney injury) (HCC)   Hypokalemia   acute cholecystitis with cholelithiasis:  -Liver WNL by [place] -See elevated LFT -12/9 CCS signed off case from their standpoint believe patient is safe for discharge.  This was discussed with daughter [name] per CTS note  Elevated LFTs -Evaluated by surgery and GI -Conservative management.  Not cholecystitis or cholelithiasis. -12/9 GI signed off case do not suspect underlying liver disease.  weakness with slurred speech:  -Negative slurred speech -12/9 consult PT/OT  AKI: Most likely prerenal.   Lab Results  Component Value Date   CREATININE 0.91 03/17/2020   CREATININE 1.18 03/16/2020   CREATININE 1.39 (H) 03/15/2020   CREATININE 2.03 (H) 03/14/2020   CREATININE 1.05 05/07/2019  -Resolving  HLD -Lipid panel pending   Hypokalemia -Potassium goal> 4 -Potassium 50 mEq -Recheck K/Mg @ 1700  Goals of care ADDENDUM 12/10 upon arrival this a.m. there is a secure chat from last night RN stating daughter states family cannot take care of father and therefore is upset that he is going to be discharged on 12/10 even though on 12/9 daughter was spoken to by myself, GI, CCS and assured patient safe for discharge. -12/10 Palliative Care Consult;Per family unable to care for patient any longer.  Requires SNF.  Change to DNR, discuss goals of care     DVT prophylaxis: Lovenox Code Status: Full Family Communication:  12/10 daughter at bedside for discussion of plan of care answered all questions Status is: Inpatient    Dispo: The patient is from: Home              Anticipated d/c is to: Home              Anticipated d/c date is: 12/10              Patient currently unstable      Consultants:    Procedures/Significant Events:    I  have personally reviewed and interpreted all radiology studies and my findings are as above.  VENTILATOR SETTINGS:    Cultures   Antimicrobials:   Devices    LINES / TUBES:      Continuous Infusions: . dextrose 5% lactated ringers with KCl 20 mEq/L 125 mL/hr at 03/16/20 0920     Objective: Vitals:   03/16/20 1104 03/16/20 1316 03/16/20 2026 03/17/20 0521  BP: 111/66 115/76 129/86 (!) 158/83  Pulse: 69 88 91 68  Resp: 17 16 18 18   Temp: 98.1 F (36.7 C) (!) 97.4 F (36.3 C) 97.6 F (36.4 C) 98.2 F (36.8 C)  TempSrc:  Oral Oral Oral  SpO2: 96% 97% 100% 96%  Weight:      Height:        Intake/Output Summary (Last 24 hours) at 03/17/2020 1158 Last data filed at 03/17/2020 1023 Gross per 24 hour  Intake 960 ml  Output 1700 ml  Net -740 ml   Filed Weights   03/14/20 1555  Weight: 81.6 kg    Examination:  General: A/O x4, No acute respiratory distress Eyes: negative scleral hemorrhage, negative anisocoria, negative icterus ENT: Negative Runny nose, negative gingival bleeding, Neck:  Negative scars, masses, torticollis, lymphadenopathy, JVD Lungs: Clear to auscultation bilaterally without wheezes or crackles Cardiovascular: Regular rate and rhythm without murmur gallop or rub normal S1 and S2 Abdomen: negative abdominal pain, nondistended, positive soft, bowel sounds, no rebound, no ascites, no appreciable mass Extremities: No significant cyanosis, clubbing, or edema bilateral lower extremities Skin: Negative rashes, lesions, ulcers Psychiatric:  Negative depression, negative anxiety, negative fatigue, negative mania  Central nervous system:  Cranial nerves II through XII intact, tongue/uvula midline, all extremities muscle strength 5/5, sensation intact throughout, negative dysarthria, negative expressive aphasia, negative receptive aphasia.  .     Data Reviewed: Care during the described time interval was provided by me .  I have reviewed this  patient's available data, including medical history, events of note, physical examination, and all test results as part of my evaluation.  CBC: Recent Labs  Lab 03/14/20 1616 03/15/20 0612 03/16/20 0457 03/17/20 0533  WBC 13.3* 7.9 6.8 7.6  NEUTROABS  --   --  4.5 4.9  HGB 13.2 12.1* 11.5* 12.3*  HCT 40.6 37.2* 35.6* 38.4*  MCV 96.4 94.2 95.7 97.5  PLT 214 177 185 190   Basic Metabolic Panel: Recent Labs  Lab 03/14/20 1616 03/15/20 0612 03/15/20 1636 03/16/20 0457 03/17/20 0533  NA 137 138  --  139 142  K 3.3* 3.2* 3.9 4.5 3.9  CL 105 109  --  111 111  CO2 18* 19*  --  24 22  GLUCOSE 106* 118*  --  116* 99  BUN 33* 29*  --  20 13  CREATININE 2.03* 1.39*  --  1.18 0.91  CALCIUM 9.0 8.4*  --  8.7* 8.8*  MG  --   --  1.9 1.8 1.9  PHOS  --  2.9  --  2.4* 3.2   GFR: Estimated Creatinine Clearance: 49.4 mL/min (by C-G formula based on SCr of 0.91 mg/dL). Liver Function Tests: Recent Labs  Lab 03/14/20 1616 03/15/20 0612 03/16/20 0457 03/17/20 0533  AST 378* 203* 103* 60*  ALT 294* 208* 160* 126*  ALKPHOS 196* 155* 145* 155*  BILITOT 4.0* 2.6* 1.5* 1.5*  PROT 6.9 5.5* 5.3* 5.8*  ALBUMIN 4.0 3.1* 3.0* 3.1*   Recent Labs  Lab 03/14/20 2100  LIPASE 27   Recent Labs  Lab 03/14/20 2100  AMMONIA 47*   Coagulation Profile: No results for input(s): INR, PROTIME in the last 168 hours. Cardiac Enzymes: Recent Labs  Lab 03/14/20 2100  CKTOTAL 5,854*   BNP (last 3 results) No results for input(s): PROBNP in the last 8760 hours. HbA1C: No results for input(s): HGBA1C in the last 72 hours. CBG: Recent Labs  Lab 03/14/20 1605 03/15/20 2033  GLUCAP 94 114*   Lipid Profile: Recent Labs    03/16/20 0457  CHOL 126  HDL 37*  LDLCALC 81  TRIG 41  CHOLHDL 3.4   Thyroid Function Tests: No results for input(s): TSH, T4TOTAL, FREET4, T3FREE, THYROIDAB in the last 72 hours. Anemia Panel: Recent Labs    03/16/20 0457  FERRITIN 57  TIBC 285  IRON 60    Sepsis Labs: Recent Labs  Lab 03/15/20 1636 03/16/20 0457 03/17/20 0533  PROCALCITON 3.63 2.52 0.96    Recent Results (from the past 240 hour(s))  Group A Strep by PCR     Status: None   Collection Time: 03/08/20 10:04 AM   Specimen: Throat; Sterile Swab  Result Value Ref Range Status   Group A Strep by PCR NOT DETECTED NOT DETECTED Final    Comment: Performed at [facility], [address] [place], [place] [address]  Resp Panel by RT-PCR (Flu A&B, Covid) Nasopharyngeal Swab     Status: None   Collection Time: 03/08/20  1:17 PM   Specimen: Nasopharyngeal Swab; Nasopharyngeal(NP) swabs in vial transport medium  Result Value Ref Range Status   SARS Coronavirus 2 by RT PCR NEGATIVE NEGATIVE Final    Comment: (NOTE) SARS-CoV-2 target nucleic acids are NOT DETECTED.  The SARS-CoV-2 RNA is generally detectable in upper respiratory specimens during the acute phase of infection. The lowest concentration of SARS-CoV-2 viral copies this assay can detect is 138 copies/mL. A negative result does not preclude SARS-Cov-2 infection and should not be used as the sole basis for treatment or other patient management decisions. A negative result may occur with  improper specimen collection/handling, submission of specimen other than nasopharyngeal swab, presence of viral mutation(s) within the areas targeted by this assay, and inadequate number of viral copies(<138 copies/mL). A negative result must be combined with clinical observations, patient history, and epidemiological information. The expected result is Negative.  Fact Sheet for Patients:  BloggerCourse.com  Fact Sheet for Healthcare Providers:  SeriousBroker.it  This test is no t yet approved or cleared by the [place] FDA and  has been authorized for detection and/or diagnosis of SARS-CoV-2 by FDA under an Emergency Use Authorization (EUA). This EUA will remain  in  effect (meaning this test can be used) for the duration of the COVID-19 declaration under Section 564(b)(1) of the Act, 21 U.S.C.section 360bbb-3(b)(1), unless the authorization is terminated  or revoked sooner.       Influenza A by PCR NEGATIVE NEGATIVE Final   Influenza B by PCR NEGATIVE NEGATIVE Final  Comment: (NOTE) The Xpert Xpress SARS-CoV-2/FLU/RSV plus assay is intended as an aid in the diagnosis of influenza from Nasopharyngeal swab specimens and should not be used as a sole basis for treatment. Nasal washings and aspirates are unacceptable for Xpert Xpress SARS-CoV-2/FLU/RSV testing.  Fact Sheet for Patients: BloggerCourse.com  Fact Sheet for Healthcare Providers: SeriousBroker.it  This test is not yet approved or cleared by the [place] FDA and has been authorized for detection and/or diagnosis of SARS-CoV-2 by FDA under an Emergency Use Authorization (EUA). This EUA will remain in effect (meaning this test can be used) for the duration of the COVID-19 declaration under Section 564(b)(1) of the Act, 21 U.S.C. section 360bbb-3(b)(1), unless the authorization is terminated or revoked.  Performed at [facility], [address] [place], [place] [address]   Resp Panel by RT-PCR (Flu A&B, Covid) Nasopharyngeal Swab     Status: None   Collection Time: 03/14/20  6:22 PM   Specimen: Nasopharyngeal Swab; Nasopharyngeal(NP) swabs in vial transport medium  Result Value Ref Range Status   SARS Coronavirus 2 by RT PCR NEGATIVE NEGATIVE Final    Comment: (NOTE) SARS-CoV-2 target nucleic acids are NOT DETECTED.  The SARS-CoV-2 RNA is generally detectable in upper respiratory specimens during the acute phase of infection. The lowest concentration of SARS-CoV-2 viral copies this assay can detect is 138 copies/mL. A negative result does not preclude SARS-Cov-2 infection and should not be used as the sole basis for  treatment or other patient management decisions. A negative result may occur with  improper specimen collection/handling, submission of specimen other than nasopharyngeal swab, presence of viral mutation(s) within the areas targeted by this assay, and inadequate number of viral copies(<138 copies/mL). A negative result must be combined with clinical observations, patient history, and epidemiological information. The expected result is Negative.  Fact Sheet for Patients:  BloggerCourse.com  Fact Sheet for Healthcare Providers:  SeriousBroker.it  This test is no t yet approved or cleared by the [place] FDA and  has been authorized for detection and/or diagnosis of SARS-CoV-2 by FDA under an Emergency Use Authorization (EUA). This EUA will remain  in effect (meaning this test can be used) for the duration of the COVID-19 declaration under Section 564(b)(1) of the Act, 21 U.S.C.section 360bbb-3(b)(1), unless the authorization is terminated  or revoked sooner.       Influenza A by PCR NEGATIVE NEGATIVE Final   Influenza B by PCR NEGATIVE NEGATIVE Final    Comment: (NOTE) The Xpert Xpress SARS-CoV-2/FLU/RSV plus assay is intended as an aid in the diagnosis of influenza from Nasopharyngeal swab specimens and should not be used as a sole basis for treatment. Nasal washings and aspirates are unacceptable for Xpert Xpress SARS-CoV-2/FLU/RSV testing.  Fact Sheet for Patients: BloggerCourse.com  Fact Sheet for Healthcare Providers: SeriousBroker.it  This test is not yet approved or cleared by the [place] FDA and has been authorized for detection and/or diagnosis of SARS-CoV-2 by FDA under an Emergency Use Authorization (EUA). This EUA will remain in effect (meaning this test can be used) for the duration of the COVID-19 declaration under Section 564(b)(1) of the Act, 21  U.S.C. section 360bbb-3(b)(1), unless the authorization is terminated or revoked.  Performed at [facility], [address] [place], [place] [address]          Radiology Studies: No results found.      Scheduled Meds: . enoxaparin (LOVENOX) injection  40 mg Subcutaneous Q24H  . fluticasone  1 spray Each Nare Daily   Continuous Infusions: . dextrose 5% lactated ringers with KCl 20 mEq/L 125 mL/hr at 03/16/20 0920     LOS: 3 days    Time spent:40 min    Dusten Ellinwood, Roselind MessierURTIS J, MD [facility] Pager [phone]  If 7PM-7AM, please contact night-coverage [email or web address] Password [email or web address] 03/17/2020, 11:58 AM

## 2020-03-17 NOTE — TOC Initial Note (Signed)
Transition of Care [facility]) - Initial/Assessment Note    Patient Details  Name: Cory Hansen MRN: [id number] Date of Birth: [birth year]  [facility]) CM/SW Contact:    Ida Rogue, LCSW Phone Number: 03/17/2020, 12:50 PM  Clinical Narrative:   Patient seen in follow up to OT recommendation of 24 hour supervision/SNF.  [name] was seated in recliner by bed, daughter [name] was visiting, with whom he gave me permission to speak freely.  Mr Boeding lives alone, states he takes care of transfers, toileting  and ambulates without difficulty, despite limiting weakness as observed by OT.He spent a significant amount of time letting me know why he did not need to go to rehab, and despite [name] and my best efforts, was noncommittal.  He did, however, agree that I could send out his information to local SNF's and let him know of bed offers while he works with PT here to prove he does not need to go to rehab. Bed search initiated. Insurance authorization initiated. TOC will continue to follow during the course of hospitalization.                 Expected Discharge Plan: Skilled Nursing Facility Barriers to Discharge: SNF Pending bed offer   Patient Goals and CMS Choice     Choice offered to / list presented to : Patient,Adult Children  Expected Discharge Plan and Services Expected Discharge Plan: Skilled Nursing Facility   Discharge Planning Services: CM Consult Post Acute Care Choice: Nursing Home Living arrangements for the past 2 months: Single Family Home                                      Prior Living Arrangements/Services Living arrangements for the past 2 months: Single Family Home Lives with:: Self Patient language and need for interpreter reviewed:: Yes        Need for Family Participation in Patient Care: Yes (Comment) Care giver support system in place?: Yes (comment)   Criminal Activity/Legal Involvement Pertinent to Current  Situation/Hospitalization: No - Comment as needed  Activities of Daily Living Home Assistive Devices/Equipment: Wheelchair,Dentures (specify type),Walker (specify type) (full set dentures) ADL Screening (condition at time of admission) Patient's cognitive ability adequate to safely complete daily activities?: No Is the patient deaf or have difficulty hearing?: Yes Does the patient have difficulty seeing, even when wearing glasses/contacts?: Yes Does the patient have difficulty concentrating, remembering, or making decisions?: Yes Patient able to express need for assistance with ADLs?: No Does the patient have difficulty dressing or bathing?: Yes Independently performs ADLs?: No Communication: Needs assistance Is this a change from baseline?: Change from baseline, expected to last >3 days Dressing (OT): Needs assistance Is this a change from baseline?: Pre-admission baseline Grooming: Needs assistance Is this a change from baseline?: Pre-admission baseline Feeding: Needs assistance Is this a change from baseline?: Pre-admission baseline Bathing: Needs assistance Is this a change from baseline?: Pre-admission baseline Toileting: Needs assistance Is this a change from baseline?: Pre-admission baseline In/Out Bed: Needs assistance Is this a change from baseline?: Pre-admission baseline Walks in Home: Dependent Is this a change from baseline?: Pre-admission baseline Does the patient have difficulty walking or climbing stairs?: Yes Weakness of Legs: Both Weakness of Arms/Hands: Both  Permission Sought/Granted Permission sought to share information with : Family Supports Permission granted to share information with : Yes, Verbal Permission Granted  Share Information with  NAME: Daughters, [name] at bedside, Paediatric nurse by phone           Emotional Assessment Appearance:: Appears stated age Attitude/Demeanor/Rapport: Engaged Affect (typically observed): Appropriate Orientation: : Oriented  to Self,Oriented to Place,Oriented to Situation Alcohol / Substance Use: Not Applicable Psych Involvement: No (comment)  Admission diagnosis:  Acute cholecystitis [K81.0] Patient Active Problem List   Diagnosis Date Noted  . Acute cholecystitis 03/14/2020  . AKI (acute kidney injury) (HCC) 03/14/2020  . Hypokalemia 03/14/2020  . SOB (shortness of breath) 08/19/2019  . Acute right ankle pain 05/07/2019  . Diarrhea 10/27/2015  . Urinary dribbling 07/26/2015  . Routine general medical examination at a health care facility 10/13/2014  . CT, CHEST, ABNORMAL 01/23/2009  . Chronic bilateral low back pain with right-sided sciatica 01/17/2009  . NEOPLASM, MALIGNANT, BLADDER, HX OF 01/17/2009  . Hx of benign neoplasm of prostate 01/17/2009  . Hyperlipidemia 01/16/2009  . ARTHRITIS 01/16/2009  . Weakness 01/16/2009   PCP:  [name], MD Pharmacy:   [facility] [address] [place]), [place] - [address] [place] ([place]) [place] [address] Phone: [phone] Fax: [phone]     Social Determinants of Health (SDOH) Interventions    Readmission Risk Interventions No flowsheet data found.

## 2020-03-18 LAB — CBC WITH DIFFERENTIAL/PLATELET
Abs Immature Granulocytes: 0.03 10*3/uL (ref 0.00–0.07)
Basophils Absolute: 0 10*3/uL (ref 0.0–0.1)
Basophils Relative: 0 %
Eosinophils Absolute: 0.3 10*3/uL (ref 0.0–0.5)
Eosinophils Relative: 5 %
HCT: 36 % — ABNORMAL LOW (ref 39.0–52.0)
Hemoglobin: 11.7 g/dL — ABNORMAL LOW (ref 13.0–17.0)
Immature Granulocytes: 0 %
Lymphocytes Relative: 22 %
Lymphs Abs: 1.5 10*3/uL (ref 0.7–4.0)
MCH: 31.3 pg (ref 26.0–34.0)
MCHC: 32.5 g/dL (ref 30.0–36.0)
MCV: 96.3 fL (ref 80.0–100.0)
Monocytes Absolute: 0.8 10*3/uL (ref 0.1–1.0)
Monocytes Relative: 12 %
Neutro Abs: 4.1 10*3/uL (ref 1.7–7.7)
Neutrophils Relative %: 61 %
Platelets: 191 10*3/uL (ref 150–400)
RBC: 3.74 MIL/uL — ABNORMAL LOW (ref 4.22–5.81)
RDW: 13.5 % (ref 11.5–15.5)
WBC: 6.7 10*3/uL (ref 4.0–10.5)
nRBC: 0 % (ref 0.0–0.2)

## 2020-03-18 LAB — ANTI-SMOOTH MUSCLE ANTIBODY, IGG: F-Actin IgG: 11 Units (ref 0–19)

## 2020-03-18 LAB — COMPREHENSIVE METABOLIC PANEL
ALT: 94 U/L — ABNORMAL HIGH (ref 0–44)
AST: 39 U/L (ref 15–41)
Albumin: 2.9 g/dL — ABNORMAL LOW (ref 3.5–5.0)
Alkaline Phosphatase: 130 U/L — ABNORMAL HIGH (ref 38–126)
Anion gap: 8 (ref 5–15)
BUN: 9 mg/dL (ref 8–23)
CO2: 24 mmol/L (ref 22–32)
Calcium: 8.9 mg/dL (ref 8.9–10.3)
Chloride: 111 mmol/L (ref 98–111)
Creatinine, Ser: 0.97 mg/dL (ref 0.61–1.24)
GFR, Estimated: >60 mL/min (ref >60)
Glucose, Bld: 119 mg/dL — ABNORMAL HIGH (ref 70–99)
Potassium: 4.2 mmol/L (ref 3.5–5.1)
Sodium: 143 mmol/L (ref 135–145)
Total Bilirubin: 0.8 mg/dL (ref 0.3–1.2)
Total Protein: 5.5 g/dL — ABNORMAL LOW (ref 6.5–8.1)

## 2020-03-18 LAB — MAGNESIUM: Magnesium: 1.9 mg/dL (ref 1.7–2.4)

## 2020-03-18 LAB — MITOCHONDRIAL ANTIBODIES: Mitochondrial M2 Ab, IgG: <20 Units (ref 0.0–20.0)

## 2020-03-18 LAB — PHOSPHORUS: Phosphorus: 3.3 mg/dL (ref 2.5–4.6)

## 2020-03-18 MED ORDER — GUAIFENESIN-DM 100-10 MG/5ML PO SYRP
5.0000 mL | ORAL_SOLUTION | ORAL | Status: DC | PRN
  Administered 2020-03-18: 15:00:00 5 mL via ORAL
  Filled 2020-03-18: qty 10

## 2020-03-18 MED ORDER — ACETAMINOPHEN 325 MG PO TABS: 650.0000 mg | ORAL_TABLET | Freq: Four times a day (QID) | ORAL | Status: DC | PRN

## 2020-03-18 NOTE — Consult Note (Signed)
Palliative Medicine Inpatient Consult Note  Reason for consult:  "Per family unable to care for patient any longer. Requires SNF. Change to DNR, discuss goals of care."  HPI: Cory Hansen is a 86 year old male with a medical history significant for osteoarthritis, kidney stones, Chronic low back pain and hyperlipidemia, admitted from ED 12/7 with acute cholecystitis, acute kidney injury, and hypokalemia. Surgery has determined surgery is not needed, he is being medically managed.   Clinical Assessment/Goals of Care: I have reviewed medical records including EPIC notes, labs and imaging, received report from bedside RN Claiborne Billings, and  assessed the patient.    I met with Cory Hansen and talked to daughter Danton Clap and daughter Bronte Kropf to further discuss diagnosis prognosis, GOC, EOL wishes, disposition and options. He is har dof hearing and has macular degeneration impacting vision.   I introduced Palliative Medicine as specialized medical care for people living with serious illness. It focuses on providing relief from the symptoms and stress of a serious illness. The goal is to improve quality of life for both the patient and the family.  A detailed discussion was had today regarding advanced directives.  Concepts specific to code status, artifical feeding and hydration, continued IV antibiotics and rehospitalization was had.  The difference between a aggressive medical intervention path  and a palliative comfort care path for this patient at this time was had. 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 He was assigned in the [facility] theatre. 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 He retired at the age of 88, the year after his wife dies. He has 2 sons and 5 children. He lives in his own home and his daughters live nearby. For the last 6 months he has not been as functional and his daughters take turns bring him his  meals, helping him with bathing, cleaning his home  and check on him multiple times a day. They all have their own health concerns. He spends most of his time sitting as he cannot stand long. He denies back pain but endorses weakness in right leg and radiating numbness down the back of his right leg.  He has difficulty with hearing and seeing due to macular degeneration.   Cory Hansen wishes for his daughter [name] to be present to review MOST form and discuss advanced directives. He thinks he has signed one before. There is not one on his hospital chart. [name] does not believe it is a medical form and states it was when he had surgery previously and was time limited. She will look for the form. She plans to be at the hospital tomorrow so advance directive  forms can be reviewed with both of them.   He has agreed to SNF for rehab. His daughters all have health concerns and are having strain with meeting physically meeting his needs.  Current symptoms to manage are pain, leg weakness, and cough. At home is uses camphor with vaseline on his joints and occasionally takes tylenol. He denies nausea currently.  Discussed the importance of continued conversation with family and their  medical providers regarding overall plan of care and treatment options, ensuring decisions are within the context of the patients values and GOCs.  Decision Maker:Currently patient, He states [name] (youngest daughter) will make decisions for him if he cannot.   SUMMARY OF RECOMMENDATIONS     Code Status/Advance Care Planning: FULL CODE  currently  Symptom Management:  Chronic joint pain with Osetoarthritis- morphine for severe pain, acetaminophen for moderate pain Cough- Robitussin prn Nausea- odansetron prn Leg weakness- physical therapy  Psycho-social/Spiritual:   Desire for further Chaplaincy support: Declined  Additional Recommendations: When [name] is present Sunday, Palliative medicine can review  advanced directives forms with patient and daughter.    Prognosis: Good with appropriate rehab to improve strength and function.  Discharge Planning: inpatient rehab for deconditioning to improve strength and function. Family preference is for [facility] SNF placement.   Vitals with BMI 03/18/2020 03/17/2020 03/17/2020  Height - - -  Weight - - -  BMI - - -  Systolic 595 396 728  Diastolic 80 82 76  Pulse 84 66 70    PPS:50%   This conversation/these recommendations were discussed with patient primary care team, Dr. Sherral Hammers  Thank you for the opportunity to participate in the care of this patient and family.   Time VT:9150 Time Out: 9:25 Total Time: 70 minutes Greater than 50%  of this time was spent counseling and coordinating care related to the above assessment and plan.  Lindell Spar, NP [facility] Palliative Medicine Team Team Cell Phone: [phone] Please utilize secure chat with additional questions, if there is no response within 30 minutes please call the above phone number  Palliative Medicine Team providers are available by phone from 7am to 7pm daily and can be reached through the team cell phone.  Should this patient require assistance outside of these hours, please call the patient's attending physician.

## 2020-03-18 NOTE — Progress Notes (Signed)
PROGRESS NOTE    Cory Hansen  XBD:[id number] DOB: 1920/10/17 DOA: 03/14/2020 PCP: [name], MD     Brief Narrative:  Cory Hansen is a 86 y.o. WM PMHx osteoarthritis, nephrolithiasis,chronic low back pain, HLD,  Presents to the ER with abdominal pain dark urine weakness and some slurred speech since yesterday.  He lives alone at home but daughter has been checking in on him repeatedly.  She found him in the extubation today.  She was worried about his condition and brought him to the ER thinking he may have UTI.  Patient was seen and evaluated in the ER.  He has elevated liver function test with abdominal pain and right upper quadrant abdominal tenderness.  Also found to be in acute kidney injury hypokalemia.  Ultrasound of the abdomen confirmed sludge with gallbladder thickening consistent with possible acute cholecystitis.  Surgery consulted and recommends admission to medical service for presumed acute cholecystitis and surgery will follow.  Patient has some nausea but no vomiting.  Has not had any diarrhea.  Patient will be admitted therefore for further evaluation and treatment..  ED Course: Temperature 97.9 blood pressure 95/62 pulse 87 respiratory rate of 18 oxygen sat 178.  White count is 13.3 hemoglobin 13.2 and platelets 214.  Sodium 137 potassium 3.3 chloride 105.  CO2 of 18 BUN 33 creatinine 2.03 and calcium 9.0 glucose 106.  Viral screen including COVID-19 is negative ultrasound abdomen showed distended gallbladder negative Murphy sign but telltale sign of cholecystitis.  Ammonia is 47 AST 378 ALT 294 total bilirubin 4.0 with GFR of 29.  Alkaline phosphatase 196.  Head CT without contrast is negative.  Based on patient's finding surgery consulted and recommends admission for possible surgical evaluation and treatment    Subjective: 12/11 afebrile overnight.  A/O x4.  Sitting in chair comfortably   Assessment & Plan: Covid vaccination; vaccinated   Principal  Problem:   Acute cholecystitis Active Problems:   Hyperlipidemia   Hx of benign neoplasm of prostate   AKI (acute kidney injury) (HCC)   Hypokalemia   acute cholecystitis with cholelithiasis:  -Liver WNL by [place] -See elevated LFT -12/9 CCS signed off case from their standpoint believe patient is safe for discharge.  This was discussed with daughter [name] per CTS note  Elevated LFTs -Evaluated by surgery and GI -Conservative management.  Not cholecystitis or cholelithiasis. -12/9 GI signed off case do not suspect underlying liver disease.  weakness with slurred speech:  -Negative slurred speech -12/9 consult PT/OT  AKI: Most likely prerenal.   Lab Results  Component Value Date   CREATININE 0.97 03/18/2020   CREATININE 0.91 03/17/2020   CREATININE 1.18 03/16/2020   CREATININE 1.39 (H) 03/15/2020   CREATININE 2.03 (H) 03/14/2020  -Resolving  HLD -12/9 LDL = 81    Hypokalemia -Potassium goal> 4   Goals of care ADDENDUM 12/10 upon arrival this a.m. there is a secure chat from last night RN stating daughter states family cannot take care of father and therefore is upset that he is going to be discharged on 12/10 even though on 12/9 daughter was spoken to by myself, GI, CCS and assured patient safe for discharge. -12/10 Palliative Care Consult;Per family unable to care for patient any longer.  Requires SNF.  Change to DNR, discuss goals of care     DVT prophylaxis: Lovenox Code Status: Full Family Communication: 12/10 daughter at bedside for discussion of plan of care answered all questions Status is: Inpatient  Dispo: The patient is from: Home              Anticipated d/c is to: Home              Anticipated d/c date is: 12/10              Patient currently unstable      Consultants:    Procedures/Significant Events:    I have personally reviewed and interpreted all radiology studies and my findings are as above.  VENTILATOR  SETTINGS:    Cultures   Antimicrobials:   Devices    LINES / TUBES:      Continuous Infusions: . dextrose 5% lactated ringers with KCl 20 mEq/L 125 mL/hr at 03/18/20 0956     Objective: Vitals:   03/17/20 1458 03/17/20 2105 03/18/20 0515 03/18/20 1406  BP: 130/76 125/82 137/80 (!) 149/82  Pulse: 70 66 84 94  Resp: 16 15 17 16   Temp: (!) 97.3 F (36.3 C) 97.6 F (36.4 C) 97.6 F (36.4 C) 97.7 F (36.5 C)  TempSrc: Oral   Oral  SpO2: 100% 96% 97% 96%  Weight:      Height:        Intake/Output Summary (Last 24 hours) at 03/18/2020 1433 Last data filed at 03/18/2020 1406 Gross per 24 hour  Intake 3190.99 ml  Output 2100 ml  Net 1090.99 ml   Filed Weights   03/14/20 1555  Weight: 81.6 kg    Examination:  General: A/O x4, No acute respiratory distress Eyes: negative scleral hemorrhage, negative anisocoria, negative icterus ENT: Negative Runny nose, negative gingival bleeding, Neck:  Negative scars, masses, torticollis, lymphadenopathy, JVD Lungs: Clear to auscultation bilaterally without wheezes or crackles Cardiovascular: Regular rate and rhythm without murmur gallop or rub normal S1 and S2 Abdomen: negative abdominal pain, nondistended, positive soft, bowel sounds, no rebound, no ascites, no appreciable mass Extremities: No significant cyanosis, clubbing, or edema bilateral lower extremities Skin: Negative rashes, lesions, ulcers Psychiatric:  Negative depression, negative anxiety, negative fatigue, negative mania  Central nervous system:  Cranial nerves II through XII intact, tongue/uvula midline, all extremities muscle strength 5/5, sensation intact throughout, negative dysarthria, negative expressive aphasia, negative receptive aphasia.  .     Data Reviewed: Care during the described time interval was provided by me .  I have reviewed this patient's available data, including medical history, events of note, physical examination, and all test  results as part of my evaluation.  CBC: Recent Labs  Lab 03/14/20 1616 03/15/20 0612 03/16/20 0457 03/17/20 0533 03/18/20 0538  WBC 13.3* 7.9 6.8 7.6 6.7  NEUTROABS  --   --  4.5 4.9 4.1  HGB 13.2 12.1* 11.5* 12.3* 11.7*  HCT 40.6 37.2* 35.6* 38.4* 36.0*  MCV 96.4 94.2 95.7 97.5 96.3  PLT 214 177 185 190 191   Basic Metabolic Panel: Recent Labs  Lab 03/14/20 1616 03/15/20 0612 03/15/20 1636 03/16/20 0457 03/17/20 0533 03/18/20 0538  NA 137 138  --  139 142 143  K 3.3* 3.2* 3.9 4.5 3.9 4.2  CL 105 109  --  111 111 111  CO2 18* 19*  --  24 22 24   GLUCOSE 106* 118*  --  116* 99 119*  BUN 33* 29*  --  20 13 9   CREATININE 2.03* 1.39*  --  1.18 0.91 0.97  CALCIUM 9.0 8.4*  --  8.7* 8.8* 8.9  MG  --   --  1.9 1.8 1.9 1.9  PHOS  --  2.9  --  2.4* 3.2 3.3   GFR: Estimated Creatinine Clearance: 46.4 mL/min (by C-G formula based on SCr of 0.97 mg/dL). Liver Function Tests: Recent Labs  Lab 03/14/20 1616 03/15/20 0612 03/16/20 0457 03/17/20 0533 03/18/20 0538  AST 378* 203* 103* 60* 39  ALT 294* 208* 160* 126* 94*  ALKPHOS 196* 155* 145* 155* 130*  BILITOT 4.0* 2.6* 1.5* 1.5* 0.8  PROT 6.9 5.5* 5.3* 5.8* 5.5*  ALBUMIN 4.0 3.1* 3.0* 3.1* 2.9*   Recent Labs  Lab 03/14/20 2100  LIPASE 27   Recent Labs  Lab 03/14/20 2100  AMMONIA 47*   Coagulation Profile: No results for input(s): INR, PROTIME in the last 168 hours. Cardiac Enzymes: Recent Labs  Lab 03/14/20 2100  CKTOTAL 5,854*   BNP (last 3 results) No results for input(s): PROBNP in the last 8760 hours. HbA1C: No results for input(s): HGBA1C in the last 72 hours. CBG: Recent Labs  Lab 03/14/20 1605 03/15/20 2033  GLUCAP 94 114*   Lipid Profile: Recent Labs    03/16/20 0457  CHOL 126  HDL 37*  LDLCALC 81  TRIG 41  CHOLHDL 3.4   Thyroid Function Tests: No results for input(s): TSH, T4TOTAL, FREET4, T3FREE, THYROIDAB in the last 72 hours. Anemia Panel: Recent Labs    03/16/20 0457   FERRITIN 57  TIBC 285  IRON 60   Sepsis Labs: Recent Labs  Lab 03/15/20 1636 03/16/20 0457 03/17/20 0533  PROCALCITON 3.63 2.52 0.96    Recent Results (from the past 240 hour(s))  Resp Panel by RT-PCR (Flu A&B, Covid) Nasopharyngeal Swab     Status: None   Collection Time: 03/14/20  6:22 PM   Specimen: Nasopharyngeal Swab; Nasopharyngeal(NP) swabs in vial transport medium  Result Value Ref Range Status   SARS Coronavirus 2 by RT PCR NEGATIVE NEGATIVE Final    Comment: (NOTE) SARS-CoV-2 target nucleic acids are NOT DETECTED.  The SARS-CoV-2 RNA is generally detectable in upper respiratory specimens during the acute phase of infection. The lowest concentration of SARS-CoV-2 viral copies this assay can detect is 138 copies/mL. A negative result does not preclude SARS-Cov-2 infection and should not be used as the sole basis for treatment or other patient management decisions. A negative result may occur with  improper specimen collection/handling, submission of specimen other than nasopharyngeal swab, presence of viral mutation(s) within the areas targeted by this assay, and inadequate number of viral copies(<138 copies/mL). A negative result must be combined with clinical observations, patient history, and epidemiological information. The expected result is Negative.  Fact Sheet for Patients:  BloggerCourse.com  Fact Sheet for Healthcare Providers:  SeriousBroker.it  This test is no t yet approved or cleared by the [place] FDA and  has been authorized for detection and/or diagnosis of SARS-CoV-2 by FDA under an Emergency Use Authorization (EUA). This EUA will remain  in effect (meaning this test can be used) for the duration of the COVID-19 declaration under Section 564(b)(1) of the Act, 21 U.S.C.section 360bbb-3(b)(1), unless the authorization is terminated  or revoked sooner.       Influenza A by PCR NEGATIVE  NEGATIVE Final   Influenza B by PCR NEGATIVE NEGATIVE Final    Comment: (NOTE) The Xpert Xpress SARS-CoV-2/FLU/RSV plus assay is intended as an aid in the diagnosis of influenza from Nasopharyngeal swab specimens and should not be used as a sole basis for treatment. Nasal washings and aspirates are unacceptable for Xpert Xpress SARS-CoV-2/FLU/RSV testing.  Fact Sheet for Patients: BloggerCourse.com  Fact Sheet for Healthcare Providers: SeriousBroker.ithttps://www.fda.gov/media/152162/download  This test is not yet approved or cleared by the [place] FDA and has been authorized for detection and/or diagnosis of SARS-CoV-2 by FDA under an Emergency Use Authorization (EUA). This EUA will remain in effect (meaning this test can be used) for the duration of the COVID-19 declaration under Section 564(b)(1) of the Act, 21 U.S.C. section 360bbb-3(b)(1), unless the authorization is terminated or revoked.  Performed at [facility], [address] [place], [place] [address]   SARS Coronavirus 2 by RT PCR (hospital order, performed in [facility] lab) Nasopharyngeal Nasopharyngeal Swab     Status: None   Collection Time: 03/17/20  2:30 PM   Specimen: Nasopharyngeal Swab  Result Value Ref Range Status   SARS Coronavirus 2 NEGATIVE NEGATIVE Final    Comment: (NOTE) SARS-CoV-2 target nucleic acids are NOT DETECTED.  The SARS-CoV-2 RNA is generally detectable in upper and lower respiratory specimens during the acute phase of infection. The lowest concentration of SARS-CoV-2 viral copies this assay can detect is 250 copies / mL. A negative result does not preclude SARS-CoV-2 infection and should not be used as the sole basis for treatment or other patient management decisions.  A negative result may occur with improper specimen collection / handling, submission of specimen other than nasopharyngeal swab, presence of viral mutation(s) within  the areas targeted by this assay, and inadequate number of viral copies (<250 copies / mL). A negative result must be combined with clinical observations, patient history, and epidemiological information.  Fact Sheet for Patients:   BoilerBrush.com.cyhttps://www.fda.gov/media/136312/download  Fact Sheet for Healthcare Providers: https://pope.com/https://www.fda.gov/media/136313/download  This test is not yet approved or  cleared by the [place] FDA and has been authorized for detection and/or diagnosis of SARS-CoV-2 by FDA under an Emergency Use Authorization (EUA).  This EUA will remain in effect (meaning this test can be used) for the duration of the COVID-19 declaration under Section 564(b)(1) of the Act, 21 U.S.C. section 360bbb-3(b)(1), unless the authorization is terminated or revoked sooner.  Performed at [facility], [address] [place], [place] [address]          Radiology Studies: No results found.      Scheduled Meds: . enoxaparin (LOVENOX) injection  40 mg Subcutaneous Q24H  . fluticasone  1 spray Each Nare Daily   Continuous Infusions: . dextrose 5% lactated ringers with KCl 20 mEq/L 125 mL/hr at 03/18/20 0956     LOS: 4 days    Time spent:40 min    Zela Sobieski, Roselind MessierURTIS J, MD [facility] Pager [phone]  If 7PM-7AM, please contact night-coverage [email or web address] Password [email or web address] 03/18/2020, 2:33 PM

## 2020-03-18 NOTE — TOC Progression Note (Signed)
Transition of Care [facility]) - Progression Note    Patient Details  Name: Cory Hansen MRN: [id number] Date of Birth: Aug 12, 1920  [facility] CM/SW Contact  Clearance Coots, LCSW Phone Number: 03/18/2020, 9:33 AM  Clinical Narrative:  Re: SNF placement for transition rehab.    [facility] authorization initiated.  OEH#[id number], Requested clinicals faxed for review.  CSW will follow up with SNF bed offers today.    Expected Discharge Plan: Skilled Nursing Facility Barriers to Discharge: SNF Pending bed offer,Insurance Authorization  Expected Discharge Plan and Services Expected Discharge Plan: Skilled Nursing Facility   Discharge Planning Services: CM Consult Post Acute Care Choice: Nursing Home Living arrangements for the past 2 months: Single Family Home                                       Social Determinants of Health (SDOH) Interventions    Readmission Risk Interventions No flowsheet data found.

## 2020-03-19 DIAGNOSIS — Z7189 Other specified counseling: Secondary | ICD-10-CM

## 2020-03-19 DIAGNOSIS — Z515 Encounter for palliative care: Secondary | ICD-10-CM

## 2020-03-19 DIAGNOSIS — R531 Weakness: Secondary | ICD-10-CM

## 2020-03-19 LAB — CBC WITH DIFFERENTIAL/PLATELET
Abs Immature Granulocytes: 0.04 10*3/uL (ref 0.00–0.07)
Basophils Absolute: 0.1 10*3/uL (ref 0.0–0.1)
Basophils Relative: 1 %
Eosinophils Absolute: 0.3 10*3/uL (ref 0.0–0.5)
Eosinophils Relative: 3 %
HCT: 37.9 % — ABNORMAL LOW (ref 39.0–52.0)
Hemoglobin: 12 g/dL — ABNORMAL LOW (ref 13.0–17.0)
Immature Granulocytes: 0 %
Lymphocytes Relative: 18 %
Lymphs Abs: 1.8 10*3/uL (ref 0.7–4.0)
MCH: 31.3 pg (ref 26.0–34.0)
MCHC: 31.7 g/dL (ref 30.0–36.0)
MCV: 99 fL (ref 80.0–100.0)
Monocytes Absolute: 1 10*3/uL (ref 0.1–1.0)
Monocytes Relative: 11 %
Neutro Abs: 6.6 10*3/uL (ref 1.7–7.7)
Neutrophils Relative %: 67 %
Platelets: 205 10*3/uL (ref 150–400)
RBC: 3.83 MIL/uL — ABNORMAL LOW (ref 4.22–5.81)
RDW: 13.8 % (ref 11.5–15.5)
WBC: 9.8 10*3/uL (ref 4.0–10.5)
nRBC: 0 % (ref 0.0–0.2)

## 2020-03-19 LAB — PHOSPHORUS: Phosphorus: 3.7 mg/dL (ref 2.5–4.6)

## 2020-03-19 LAB — COMPREHENSIVE METABOLIC PANEL
ALT: 85 U/L — ABNORMAL HIGH (ref 0–44)
AST: 36 U/L (ref 15–41)
Albumin: 3.1 g/dL — ABNORMAL LOW (ref 3.5–5.0)
Alkaline Phosphatase: 128 U/L — ABNORMAL HIGH (ref 38–126)
Anion gap: 9 (ref 5–15)
BUN: 7 mg/dL — ABNORMAL LOW (ref 8–23)
CO2: 27 mmol/L (ref 22–32)
Calcium: 9.1 mg/dL (ref 8.9–10.3)
Chloride: 108 mmol/L (ref 98–111)
Creatinine, Ser: 0.92 mg/dL (ref 0.61–1.24)
GFR, Estimated: >60 mL/min (ref >60)
Glucose, Bld: 110 mg/dL — ABNORMAL HIGH (ref 70–99)
Potassium: 4.7 mmol/L (ref 3.5–5.1)
Sodium: 144 mmol/L (ref 135–145)
Total Bilirubin: 0.9 mg/dL (ref 0.3–1.2)
Total Protein: 5.8 g/dL — ABNORMAL LOW (ref 6.5–8.1)

## 2020-03-19 LAB — MAGNESIUM: Magnesium: 1.8 mg/dL (ref 1.7–2.4)

## 2020-03-19 NOTE — Progress Notes (Signed)
Daily Progress Note   Patient Name: Cory Hansen       Date: 03/19/2020 DOB: 12-11-1920  Age: 86 y.o. MRN#: [id number] Attending Physician: Drema Dallas, MD Primary Care Physician: [name], MD Admit Date: 03/14/2020  Reason for Consultation/Follow-up: Establishing goals of care  Subjective:  on bedside commode, no distress, daughter [name] at bedside.   Length of Stay: 5  Current Medications: Scheduled Meds:  . enoxaparin (LOVENOX) injection  40 mg Subcutaneous Q24H  . fluticasone  1 spray Each Nare Daily    Continuous Infusions: . dextrose 5% lactated ringers with KCl 20 mEq/L 125 mL/hr at 03/18/20 1854    PRN Meds: acetaminophen, guaiFENesin-dextromethorphan, morphine injection, ondansetron **OR** ondansetron (ZOFRAN) IV  Physical Exam         Awake alert No distress Regular work of breathing Abdomen not distended Non focal.  Vital Signs: BP (!) 152/98 (BP Location: Right Arm)   Pulse 91   Temp 98.2 F (36.8 C) (Oral)   Resp 17   Ht 5\' 11"  (1.803 m)   Wt 81.6 kg   SpO2 97%   BMI 25.10 kg/m  SpO2: SpO2: 97 % O2 Device: O2 Device: Room Air O2 Flow Rate:    Intake/output summary:   Intake/Output Summary (Last 24 hours) at 03/19/2020 1308 Last data filed at 03/19/2020 1300 Gross per 24 hour  Intake 3409.68 ml  Output 2215 ml  Net 1194.68 ml   LBM: Last BM Date: 03/15/20 Baseline Weight: Weight: 81.6 kg Most recent weight: Weight: 81.6 kg       Palliative Assessment/Data:      Patient Active Problem List   Diagnosis Date Noted  . Acute cholecystitis 03/14/2020  . AKI (acute kidney injury) (HCC) 03/14/2020  . Hypokalemia 03/14/2020  . SOB (shortness of breath) 08/19/2019  . Acute right ankle pain 05/07/2019  . Diarrhea  10/27/2015  . Urinary dribbling 07/26/2015  . Routine general medical examination at a health care facility 10/13/2014  . CT, CHEST, ABNORMAL 01/23/2009  . Chronic bilateral low back pain with right-sided sciatica 01/17/2009  . NEOPLASM, MALIGNANT, BLADDER, HX OF 01/17/2009  . Hx of benign neoplasm of prostate 01/17/2009  . Hyperlipidemia 01/16/2009  . ARTHRITIS 01/16/2009  . Weakness 01/16/2009    Palliative Care Assessment & Plan   Patient Profile:  Assessment:  acute cholecystitis with cholelithiasis Weakness AKI   Recommendations/Plan:   discussed with daughter [name] who has arrived at the bedside, patient has elected [name] to be his designated Geographical information systems officer. Daughter probably has advanced directives paperwork that she is trying to review.  Full code To SNF rehab with palliative on discharge.  No additional PMT specific recommendations at this time. Anticipate TOC follow up in am.    Code Status:    Code Status Orders  (From admission, onward)         Start     Ordered   03/14/20 2301  Full code  Continuous        03/14/20 2300        Code Status History    Date Active Date Inactive Code Status Order ID Comments User Context   02/22/2014 1751 02/25/2014 1725 Full Code [id number]  Toni Arthurs, MD Inpatient   02/11/2014 1232 02/14/2014 1837 Full Code [id number]  Abram Sander, MD Inpatient   Advance Care Planning Activity       Prognosis:   Unable to determine  Discharge Planning:  Skilled Nursing Facility for rehab with Palliative care service follow-up  Care plan was discussed with patient, daughter [name] at bedside.    Thank you for allowing the Palliative Medicine Team to assist in the care of this patient.   Time In: 12 Time Out: 12.35 Total Time 35 Prolonged Time Billed  no       Greater than 50%  of this time was spent counseling and coordinating care related to the above assessment and plan.  Rosalin Hawking, MD  Please contact  Palliative Medicine Team phone at [phone] for questions and concerns.

## 2020-03-19 NOTE — Progress Notes (Signed)
Occupational Therapy Treatment Patient Details Name: Cory Hansen MRN: [id number] DOB: 02-Sep-1920 Today's Date: 03/19/2020    History of present illness Patient is a 86 year old male PMH of osteoarthritis and kidney stones, chronic low back pain and hyperlipidemia who presents to the ER with abdominal pain, dark urine, weakness. Admitted with acute cholecystitis.   OT comments  Pt agreeable to OOB and agreeqable to rehab- family cant provide this level of A  Follow Up Recommendations  SNF (vs SNF if unable to stay with DTRs)    Equipment Recommendations  None recommended by OT    Recommendations for Other Services      Precautions / Restrictions Precautions Precautions: Fall Precaution Comments: patient reports 2 falls in past 6 months       Mobility Bed Mobility Overal bed mobility: Needs Assistance Bed Mobility: Supine to Sit     Supine to sit: Mod assist        Transfers Overall transfer level: Needs assistance Equipment used: 2 person hand held assist Transfers: Sit to/from Stand Sit to Stand: Max assist;From elevated surface Stand pivot transfers: Max assist            Balance Overall balance assessment: History of Falls;Needs assistance Sitting-balance support: Feet supported Sitting balance-Leahy Scale: Fair     Standing balance support: Single extremity supported Standing balance-Leahy Scale: Poor                             ADL either performed or assessed with clinical judgement   ADL Overall ADL's : Needs assistance/impaired     Grooming: Wash/dry face;Wash/dry hands;Sitting;Cueing for sequencing;Minimal assistance                   Toilet Transfer: Stand-pivot;Cueing for safety;Cueing for sequencing;BSC;Maximal assistance;+2 for physical assistance;+2 for safety/equipment Toilet Transfer Details (indicate cue type and reason): bed to chair           General ADL Comments: patient requiring increased assistance  with transfer this day               Cognition Arousal/Alertness: Awake/alert Behavior During Therapy: WFL for tasks assessed/performed Overall Cognitive Status: Within Functional Limits for tasks assessed                                                     Pertinent Vitals/ Pain       Pain Assessment: No/denies pain  Home Living Family/patient expects to be discharged to:: Private residence Living Arrangements: Alone Available Help at Discharge: Family;Available PRN/intermittently Type of Home: House Home Access: Ramped entrance     Home Layout: One level;Other (Comment) (does not have  to go down  basement)               Home Equipment: Hand held shower head;Shower seat;Grab bars - tub/shower;Walker - 2 wheels;Bedside commode;Wheelchair - manual;Hospital bed          Prior Functioning/Environment Level of Independence: Needs assistance  Gait / Transfers Assistance Needed: patient reports ~6 months using wheelchair more vs walker         Frequency  Min 2X/week        Progress Toward Goals  OT Goals(current goals can now be found in the care plan section)  Progress towards OT goals: Progressing  toward goals  Acute Rehab OT Goals Patient Stated Goal: agreeable to rehab  Plan Discharge plan needs to be updated    Co-evaluation                 AM-PAC OT "6 Clicks" Daily Activity     Outcome Measure   Help from another person eating meals?: A Little Help from another person taking care of personal grooming?: A Little Help from another person toileting, which includes using toliet, bedpan, or urinal?: A Lot Help from another person bathing (including washing, rinsing, drying)?: A Lot Help from another person to put on and taking off regular upper body clothing?: A Little Help from another person to put on and taking off regular lower body clothing?: A Lot 6 Click Score: 15    End of Session Equipment Utilized During  Treatment: Gait belt  OT Visit Diagnosis: Other abnormalities of gait and mobility (R26.89);Muscle weakness (generalized) (M62.81)   Activity Tolerance Patient tolerated treatment well   Patient Left in chair;with call bell/phone within reach;with family/visitor present   Nurse Communication Mobility status        Time: 7425-9563 OT Time Calculation (min): 13 min  Charges: OT General Charges $OT Visit: 1 Visit OT Treatments $Self Care/Home Management : 8-22 mins  Lise Auer, OT Acute Rehabilitation Services [phone] Office- [phone]      Cory Hansen, Cory Hansen 03/19/2020, 11:15 AM

## 2020-03-19 NOTE — Progress Notes (Signed)
PROGRESS NOTE    Cory Hansen  MVH:[id number] DOB: 02/24/1921 DOA: 03/14/2020 PCP: [name], MD     Brief Narrative:  Cory Hansen is a 86 y.o. WM PMHx osteoarthritis, nephrolithiasis,chronic low back pain, HLD,  Presents to the ER with abdominal pain dark urine weakness and some slurred speech since yesterday.  He lives alone at home but daughter has been checking in on him repeatedly.  She found him in the extubation today.  She was worried about his condition and brought him to the ER thinking he may have UTI.  Patient was seen and evaluated in the ER.  He has elevated liver function test with abdominal pain and right upper quadrant abdominal tenderness.  Also found to be in acute kidney injury hypokalemia.  Ultrasound of the abdomen confirmed sludge with gallbladder thickening consistent with possible acute cholecystitis.  Surgery consulted and recommends admission to medical service for presumed acute cholecystitis and surgery will follow.  Patient has some nausea but no vomiting.  Has not had any diarrhea.  Patient will be admitted therefore for further evaluation and treatment..  ED Course: Temperature 97.9 blood pressure 95/62 pulse 87 respiratory rate of 18 oxygen sat 178.  White count is 13.3 hemoglobin 13.2 and platelets 214.  Sodium 137 potassium 3.3 chloride 105.  CO2 of 18 BUN 33 creatinine 2.03 and calcium 9.0 glucose 106.  Viral screen including COVID-19 is negative ultrasound abdomen showed distended gallbladder negative Murphy sign but telltale sign of cholecystitis.  Ammonia is 47 AST 378 ALT 294 total bilirubin 4.0 with GFR of 29.  Alkaline phosphatase 196.  Head CT without contrast is negative.  Based on patient's finding surgery consulted and recommends admission for possible surgical evaluation and treatment    Subjective: 12/12 afebrile overnight A/O x4, sitting in chair comfortably.    Assessment & Plan: Covid vaccination; vaccinated   Principal  Problem:   Acute cholecystitis Active Problems:   Hyperlipidemia   Hx of benign neoplasm of prostate   AKI (acute kidney injury) (HCC)   Hypokalemia   acute cholecystitis with cholelithiasis:  -Liver WNL by [place] -See elevated LFT -12/9 CCS signed off case from their standpoint believe patient is safe for discharge.  This was discussed with daughter [name] per CTS note  Elevated LFTs -Evaluated by surgery and GI -Conservative management.  Not cholecystitis or cholelithiasis. -12/9 GI signed off case do not suspect underlying liver disease.  weakness with slurred speech:  -Negative slurred speech -12/9 consult PT/OT  AKI: Most likely prerenal.   Lab Results  Component Value Date   CREATININE 0.92 03/19/2020   CREATININE 0.97 03/18/2020   CREATININE 0.91 03/17/2020   CREATININE 1.18 03/16/2020   CREATININE 1.39 (H) 03/15/2020  -Resolving  HLD -12/9 LDL = 81    Hypokalemia -Potassium goal> 4   Goals of care ADDENDUM 12/10 upon arrival this a.m. there is a secure chat from last night RN stating daughter states family cannot take care of father and therefore is upset that he is going to be discharged on 12/10 even though on 12/9 daughter was spoken to by myself, GI, CCS and assured patient safe for discharge. -12/10 Palliative Care Consult;Per family unable to care for patient any longer.  Requires SNF.  Change to DNR, discuss goals of care     DVT prophylaxis: Lovenox Code Status: Full Family Communication: 12/12 daughter at bedside for discussion of plan of care answered all questions Status is: Inpatient    Dispo: The  patient is from: Home              Anticipated d/c is to: Home              Anticipated d/c date is: 12/10              Patient currently unstable      Consultants:    Procedures/Significant Events:    I have personally reviewed and interpreted all radiology studies and my findings are as above.  VENTILATOR  SETTINGS:    Cultures   Antimicrobials:   Devices    LINES / TUBES:      Continuous Infusions: . dextrose 5% lactated ringers with KCl 20 mEq/L 125 mL/hr at 03/18/20 1854     Objective: Vitals:   03/18/20 0515 03/18/20 1406 03/18/20 2201 03/19/20 0557  BP: 137/80 (!) 149/82 (!) 160/93 (!) 152/98  Pulse: 84 94 (!) 103 91  Resp: 17 16 16 17   Temp: 97.6 F (36.4 C) 97.7 F (36.5 C) 98.1 F (36.7 C) 98.2 F (36.8 C)  TempSrc:  Oral  Oral  SpO2: 97% 96% 96% 97%  Weight:      Height:        Intake/Output Summary (Last 24 hours) at 03/19/2020 1120 Last data filed at 03/19/2020 0915 Gross per 24 hour  Intake 3169.68 ml  Output 2365 ml  Net 804.68 ml   Filed Weights   03/14/20 1555  Weight: 81.6 kg    Examination:  General: A/O x4, No acute respiratory distress Eyes: negative scleral hemorrhage, negative anisocoria, negative icterus ENT: Negative Runny nose, negative gingival bleeding, Neck:  Negative scars, masses, torticollis, lymphadenopathy, JVD Lungs: Clear to auscultation bilaterally without wheezes or crackles Cardiovascular: Regular rate and rhythm without murmur gallop or rub normal S1 and S2 Abdomen: negative abdominal pain, nondistended, positive soft, bowel sounds, no rebound, no ascites, no appreciable mass Extremities: No significant cyanosis, clubbing, or edema bilateral lower extremities Skin: Negative rashes, lesions, ulcers Psychiatric:  Negative depression, negative anxiety, negative fatigue, negative mania  Central nervous system:  Cranial nerves II through XII intact, tongue/uvula midline, all extremities muscle strength 5/5, sensation intact throughout, negative dysarthria, negative expressive aphasia, negative receptive aphasia.  .     Data Reviewed: Care during the described time interval was provided by me .  I have reviewed this patient's available data, including medical history, events of note, physical examination, and all test  results as part of my evaluation.  CBC: Recent Labs  Lab 03/15/20 0612 03/16/20 0457 03/17/20 0533 03/18/20 0538 03/19/20 0514  WBC 7.9 6.8 7.6 6.7 9.8  NEUTROABS  --  4.5 4.9 4.1 6.6  HGB 12.1* 11.5* 12.3* 11.7* 12.0*  HCT 37.2* 35.6* 38.4* 36.0* 37.9*  MCV 94.2 95.7 97.5 96.3 99.0  PLT 177 185 190 191 205   Basic Metabolic Panel: Recent Labs  Lab 03/15/20 0612 03/15/20 1636 03/16/20 0457 03/17/20 0533 03/18/20 0538 03/19/20 0514  NA 138  --  139 142 143 144  K 3.2* 3.9 4.5 3.9 4.2 4.7  CL 109  --  111 111 111 108  CO2 19*  --  24 22 24 27   GLUCOSE 118*  --  116* 99 119* 110*  BUN 29*  --  20 13 9  7*  CREATININE 1.39*  --  1.18 0.91 0.97 0.92  CALCIUM 8.4*  --  8.7* 8.8* 8.9 9.1  MG  --  1.9 1.8 1.9 1.9 1.8  PHOS 2.9  --  2.4*  3.2 3.3 3.7   GFR: Estimated Creatinine Clearance: 48.9 mL/min (by C-G formula based on SCr of 0.92 mg/dL). Liver Function Tests: Recent Labs  Lab 03/15/20 0612 03/16/20 0457 03/17/20 0533 03/18/20 0538 03/19/20 0514  AST 203* 103* 60* 39 36  ALT 208* 160* 126* 94* 85*  ALKPHOS 155* 145* 155* 130* 128*  BILITOT 2.6* 1.5* 1.5* 0.8 0.9  PROT 5.5* 5.3* 5.8* 5.5* 5.8*  ALBUMIN 3.1* 3.0* 3.1* 2.9* 3.1*   Recent Labs  Lab 03/14/20 2100  LIPASE 27   Recent Labs  Lab 03/14/20 2100  AMMONIA 47*   Coagulation Profile: No results for input(s): INR, PROTIME in the last 168 hours. Cardiac Enzymes: Recent Labs  Lab 03/14/20 2100  CKTOTAL 5,854*   BNP (last 3 results) No results for input(s): PROBNP in the last 8760 hours. HbA1C: No results for input(s): HGBA1C in the last 72 hours. CBG: Recent Labs  Lab 03/14/20 1605 03/15/20 2033  GLUCAP 94 114*   Lipid Profile: No results for input(s): CHOL, HDL, LDLCALC, TRIG, CHOLHDL, LDLDIRECT in the last 72 hours. Thyroid Function Tests: No results for input(s): TSH, T4TOTAL, FREET4, T3FREE, THYROIDAB in the last 72 hours. Anemia Panel: No results for input(s): VITAMINB12, FOLATE,  FERRITIN, TIBC, IRON, RETICCTPCT in the last 72 hours. Sepsis Labs: Recent Labs  Lab 03/15/20 1636 03/16/20 0457 03/17/20 0533  PROCALCITON 3.63 2.52 0.96    Recent Results (from the past 240 hour(s))  Resp Panel by RT-PCR (Flu A&B, Covid) Nasopharyngeal Swab     Status: None   Collection Time: 03/14/20  6:22 PM   Specimen: Nasopharyngeal Swab; Nasopharyngeal(NP) swabs in vial transport medium  Result Value Ref Range Status   SARS Coronavirus 2 by RT PCR NEGATIVE NEGATIVE Final    Comment: (NOTE) SARS-CoV-2 target nucleic acids are NOT DETECTED.  The SARS-CoV-2 RNA is generally detectable in upper respiratory specimens during the acute phase of infection. The lowest concentration of SARS-CoV-2 viral copies this assay can detect is 138 copies/mL. A negative result does not preclude SARS-Cov-2 infection and should not be used as the sole basis for treatment or other patient management decisions. A negative result may occur with  improper specimen collection/handling, submission of specimen other than nasopharyngeal swab, presence of viral mutation(s) within the areas targeted by this assay, and inadequate number of viral copies(<138 copies/mL). A negative result must be combined with clinical observations, patient history, and epidemiological information. The expected result is Negative.  Fact Sheet for Patients:  BloggerCourse.com  Fact Sheet for Healthcare Providers:  SeriousBroker.it  This test is no t yet approved or cleared by the [place] FDA and  has been authorized for detection and/or diagnosis of SARS-CoV-2 by FDA under an Emergency Use Authorization (EUA). This EUA will remain  in effect (meaning this test can be used) for the duration of the COVID-19 declaration under Section 564(b)(1) of the Act, 21 U.S.C.section 360bbb-3(b)(1), unless the authorization is terminated  or revoked sooner.       Influenza A  by PCR NEGATIVE NEGATIVE Final   Influenza B by PCR NEGATIVE NEGATIVE Final    Comment: (NOTE) The Xpert Xpress SARS-CoV-2/FLU/RSV plus assay is intended as an aid in the diagnosis of influenza from Nasopharyngeal swab specimens and should not be used as a sole basis for treatment. Nasal washings and aspirates are unacceptable for Xpert Xpress SARS-CoV-2/FLU/RSV testing.  Fact Sheet for Patients: BloggerCourse.com  Fact Sheet for Healthcare Providers: SeriousBroker.it  This test is not yet approved or cleared  by the [place] FDA and has been authorized for detection and/or diagnosis of SARS-CoV-2 by FDA under an Emergency Use Authorization (EUA). This EUA will remain in effect (meaning this test can be used) for the duration of the COVID-19 declaration under Section 564(b)(1) of the Act, 21 U.S.C. section 360bbb-3(b)(1), unless the authorization is terminated or revoked.  Performed at [facility], [address] [place], [place] [address]   SARS Coronavirus 2 by RT PCR (hospital order, performed in [facility] hospital lab) Nasopharyngeal Nasopharyngeal Swab     Status: None   Collection Time: 03/17/20  2:30 PM   Specimen: Nasopharyngeal Swab  Result Value Ref Range Status   SARS Coronavirus 2 NEGATIVE NEGATIVE Final    Comment: (NOTE) SARS-CoV-2 target nucleic acids are NOT DETECTED.  The SARS-CoV-2 RNA is generally detectable in upper and lower respiratory specimens during the acute phase of infection. The lowest concentration of SARS-CoV-2 viral copies this assay can detect is 250 copies / mL. A negative result does not preclude SARS-CoV-2 infection and should not be used as the sole basis for treatment or other patient management decisions.  A negative result may occur with improper specimen collection / handling, submission of specimen other than nasopharyngeal swab, presence of viral mutation(s)  within the areas targeted by this assay, and inadequate number of viral copies (<250 copies / mL). A negative result must be combined with clinical observations, patient history, and epidemiological information.  Fact Sheet for Patients:   BoilerBrush.com.cyhttps://www.fda.gov/media/136312/download  Fact Sheet for Healthcare Providers: https://pope.com/https://www.fda.gov/media/136313/download  This test is not yet approved or  cleared by the [place] FDA and has been authorized for detection and/or diagnosis of SARS-CoV-2 by FDA under an Emergency Use Authorization (EUA).  This EUA will remain in effect (meaning this test can be used) for the duration of the COVID-19 declaration under Section 564(b)(1) of the Act, 21 U.S.C. section 360bbb-3(b)(1), unless the authorization is terminated or revoked sooner.  Performed at [facility], [address] [place], [place] [address]          Radiology Studies: No results found.      Scheduled Meds: . enoxaparin (LOVENOX) injection  40 mg Subcutaneous Q24H  . fluticasone  1 spray Each Nare Daily   Continuous Infusions: . dextrose 5% lactated ringers with KCl 20 mEq/L 125 mL/hr at 03/18/20 1854     LOS: 5 days    Time spent:40 min    Gwenna Fuston, Roselind MessierURTIS J, MD [facility] Pager [phone]  If 7PM-7AM, please contact night-coverage [email or web address] Password [email or web address] 03/19/2020, 11:20 AM

## 2020-03-20 DIAGNOSIS — Z7189 Other specified counseling: Secondary | ICD-10-CM | POA: Diagnosis not present

## 2020-03-20 DIAGNOSIS — M2550 Pain in unspecified joint: Secondary | ICD-10-CM | POA: Diagnosis not present

## 2020-03-20 DIAGNOSIS — M5431 Sciatica, right side: Secondary | ICD-10-CM | POA: Diagnosis not present

## 2020-03-20 DIAGNOSIS — K810 Acute cholecystitis: Secondary | ICD-10-CM | POA: Diagnosis not present

## 2020-03-20 DIAGNOSIS — E785 Hyperlipidemia, unspecified: Secondary | ICD-10-CM | POA: Diagnosis not present

## 2020-03-20 DIAGNOSIS — Z7401 Bed confinement status: Secondary | ICD-10-CM | POA: Diagnosis not present

## 2020-03-20 DIAGNOSIS — E876 Hypokalemia: Secondary | ICD-10-CM | POA: Diagnosis not present

## 2020-03-20 DIAGNOSIS — R531 Weakness: Secondary | ICD-10-CM | POA: Diagnosis not present

## 2020-03-20 DIAGNOSIS — R0989 Other specified symptoms and signs involving the circulatory and respiratory systems: Secondary | ICD-10-CM | POA: Diagnosis not present

## 2020-03-20 DIAGNOSIS — M1990 Unspecified osteoarthritis, unspecified site: Secondary | ICD-10-CM | POA: Diagnosis not present

## 2020-03-20 DIAGNOSIS — L89151 Pressure ulcer of sacral region, stage 1: Secondary | ICD-10-CM | POA: Diagnosis not present

## 2020-03-20 DIAGNOSIS — E7800 Pure hypercholesterolemia, unspecified: Secondary | ICD-10-CM | POA: Diagnosis not present

## 2020-03-20 DIAGNOSIS — M1993 Secondary osteoarthritis, unspecified site: Secondary | ICD-10-CM | POA: Diagnosis not present

## 2020-03-20 DIAGNOSIS — N179 Acute kidney failure, unspecified: Secondary | ICD-10-CM | POA: Diagnosis not present

## 2020-03-20 DIAGNOSIS — R269 Unspecified abnormalities of gait and mobility: Secondary | ICD-10-CM | POA: Diagnosis not present

## 2020-03-20 DIAGNOSIS — Z743 Need for continuous supervision: Secondary | ICD-10-CM | POA: Diagnosis not present

## 2020-03-20 DIAGNOSIS — R29898 Other symptoms and signs involving the musculoskeletal system: Secondary | ICD-10-CM | POA: Diagnosis not present

## 2020-03-20 DIAGNOSIS — R7401 Elevation of levels of liver transaminase levels: Secondary | ICD-10-CM | POA: Diagnosis not present

## 2020-03-20 DIAGNOSIS — R5381 Other malaise: Secondary | ICD-10-CM | POA: Diagnosis not present

## 2020-03-20 LAB — CBC WITH DIFFERENTIAL/PLATELET
Abs Immature Granulocytes: 0.05 10*3/uL (ref 0.00–0.07)
Basophils Absolute: 0.1 10*3/uL (ref 0.0–0.1)
Basophils Relative: 1 %
Eosinophils Absolute: 0.2 10*3/uL (ref 0.0–0.5)
Eosinophils Relative: 2 %
HCT: 39.1 % (ref 39.0–52.0)
Hemoglobin: 12.1 g/dL — ABNORMAL LOW (ref 13.0–17.0)
Immature Granulocytes: 1 %
Lymphocytes Relative: 13 %
Lymphs Abs: 1.4 10*3/uL (ref 0.7–4.0)
MCH: 31.1 pg (ref 26.0–34.0)
MCHC: 30.9 g/dL (ref 30.0–36.0)
MCV: 100.5 fL — ABNORMAL HIGH (ref 80.0–100.0)
Monocytes Absolute: 1 10*3/uL (ref 0.1–1.0)
Monocytes Relative: 10 %
Neutro Abs: 7.7 10*3/uL (ref 1.7–7.7)
Neutrophils Relative %: 73 %
Platelets: 206 10*3/uL (ref 150–400)
RBC: 3.89 MIL/uL — ABNORMAL LOW (ref 4.22–5.81)
RDW: 13.7 % (ref 11.5–15.5)
WBC: 10.5 10*3/uL (ref 4.0–10.5)
nRBC: 0 % (ref 0.0–0.2)

## 2020-03-20 LAB — COMPREHENSIVE METABOLIC PANEL
ALT: 74 U/L — ABNORMAL HIGH (ref 0–44)
AST: 30 U/L (ref 15–41)
Albumin: 3.1 g/dL — ABNORMAL LOW (ref 3.5–5.0)
Alkaline Phosphatase: 122 U/L (ref 38–126)
Anion gap: 9 (ref 5–15)
BUN: 6 mg/dL — ABNORMAL LOW (ref 8–23)
CO2: 26 mmol/L (ref 22–32)
Calcium: 8.9 mg/dL (ref 8.9–10.3)
Chloride: 108 mmol/L (ref 98–111)
Creatinine, Ser: 0.9 mg/dL (ref 0.61–1.24)
GFR, Estimated: >60 mL/min (ref >60)
Glucose, Bld: 105 mg/dL — ABNORMAL HIGH (ref 70–99)
Potassium: 4.6 mmol/L (ref 3.5–5.1)
Sodium: 143 mmol/L (ref 135–145)
Total Bilirubin: 0.8 mg/dL (ref 0.3–1.2)
Total Protein: 5.7 g/dL — ABNORMAL LOW (ref 6.5–8.1)

## 2020-03-20 LAB — MAGNESIUM: Magnesium: 1.8 mg/dL (ref 1.7–2.4)

## 2020-03-20 LAB — PHOSPHORUS: Phosphorus: 3.6 mg/dL (ref 2.5–4.6)

## 2020-03-20 MED ORDER — ACETAMINOPHEN 325 MG PO TABS: 650.0000 mg | ORAL_TABLET | Freq: Four times a day (QID) | ORAL | 0 refills | Status: DC | PRN

## 2020-03-20 MED ORDER — ONDANSETRON HCL 4 MG PO TABS: 4.0000 mg | ORAL_TABLET | Freq: Four times a day (QID) | ORAL | 0 refills | Status: DC | PRN

## 2020-03-20 MED ORDER — GUAIFENESIN-DM 100-10 MG/5ML PO SYRP: 5.0000 mL | ORAL_SOLUTION | ORAL | 0 refills | Status: DC | PRN

## 2020-03-20 NOTE — Progress Notes (Signed)
PMT progress note  Resting comfortably in bed. No distress no family at bedside BP (!) 149/90 (BP Location: Right Arm)   Pulse (!) 102   Temp 98.4 F (36.9 C) (Oral)   Resp 18   Ht 5\' 11"  (1.803 m)   Wt 81.6 kg   SpO2 93%   BMI 25.10 kg/m  Labs and chart noted. No new PMT recommendations.   SNF rehab with palliative on discharge.  MD [place] palliative.

## 2020-03-20 NOTE — Progress Notes (Signed)
SW made aware family had questions regarding care after SNF d/c.

## 2020-03-20 NOTE — TOC Transition Note (Addendum)
Transition of Care [facility]) - CM/SW Discharge Note   Patient Details  Name: Cory Hansen MRN: [id number] Date of Birth: 02-17-1921  [facility] CM/SW Contact:  Clearance Coots, LCSW Phone Number: 03/20/2020, 12:38 PM   Clinical Narrative:    Daughter Cory Hansen chose bed at [facility].  CSW confirm bed availability and provided Auth details. To SNF staff [facility] #[id number] 13th next review day 15th.  Patient vaccinated. Staff confirm 12/10 test is sufficient  PTAR to transport.  Nurse call report to: [phone]     Final next level of care: Skilled Nursing Facility Barriers to Discharge: Barriers Resolved   Patient Goals and CMS Choice Patient states their goals for this hospitalization and ongoing recovery are:: regain strength and return home CMS Medicare.gov Compare Post Acute Care list provided to:: Patient Choice offered to / list presented to : Patient,Adult Children  Discharge Placement PASRR number recieved: 03/16/20            Patient chooses bed at: [facility] Patient to be transferred to facility by: PTAR Name of family member notified: Daughter Cory Hansen Patient and family notified of of transfer: 03/20/20  Discharge Plan and Services   Discharge Planning Services: CM Consult Post Acute Care Choice: Nursing Home                               Social Determinants of Health (SDOH) Interventions     Readmission Risk Interventions No flowsheet data found.

## 2020-03-20 NOTE — Progress Notes (Signed)
Attempted to call report to [facility]. 

## 2020-03-20 NOTE — Discharge Summary (Signed)
Physician Discharge Summary  Cory Hansen WUJ:[id number] DOB: January 19, 1921 DOA: 03/14/2020  PCP: [name], MD  Admit date: 03/14/2020 Discharge date: 03/20/2020  Time spent: 35 minutes  Recommendations for Outpatient Follow-up:    acute cholecystitis with cholelithiasis: -Liver WNL by [place] -See elevated LFT -12/9 [facility] signed off case from their standpoint believe patient is safe for discharge.  This was discussed with daughter [name] per CTS note  Elevated LFTs -Evaluated by surgery and GI -Conservative management.  Not cholecystitis or cholelithiasis. -12/9 GI signed off case do not suspect underlying liver disease. -12/13 LFTs continue to trend down  weakness with slurred speech: -Negative slurred speech -12/9 consult PT/OT; recommend SNF  NFA:OZHY likely prerenal.  Lab Results  Component Value Date   CREATININE 0.90 03/20/2020   CREATININE 0.92 03/19/2020   CREATININE 0.97 03/18/2020   CREATININE 0.91 03/17/2020   CREATININE 1.18 03/16/2020  -Within normal limits  HLD -12/9 LDL = 81   Hypokalemia -Potassium goal> 4   Goals of care ADDENDUM 12/10 upon arrival this a.m. there is a secure chat from last night RN stating daughter states family cannot take care of father and therefore is upset that he is going to be discharged on 12/10 even though on 12/9 daughter was spoken to by myself, GI, [facility] and assured patient safe for discharge. -12/10 Palliative Care Consult;Per family unable to care for patient any longer.  Requires SNF.  Change to DNR, discuss goals of care     Discharge Diagnoses:  Principal Problem:   Acute cholecystitis Active Problems:   Hyperlipidemia   Hx of benign neoplasm of prostate   AKI (acute kidney injury) (HCC)   Hypokalemia   Discharge Condition: Stable  Diet recommendation: Diet soft  Filed Weights   03/14/20 1555  Weight: 81.6 kg    History of present illness:  86 y.o.WM PMHx osteoarthritis,  nephrolithiasis,chronic low back pain, HLD,  Presents to the ER with abdominal pain dark urine weakness and some slurred speech since yesterday. He lives alone at home but daughter has been checking in on him repeatedly. She found him in the extubation today. She was worried about his condition and brought him to the ER thinking he may have UTI. Patient was seen and evaluated in the ER. He has elevated liver function test with abdominal pain and right upper quadrant abdominal tenderness. Also found to be in acute kidney injury hypokalemia. Ultrasound of the abdomen confirmed sludge with gallbladder thickening consistent with possible acute cholecystitis. Surgery consulted and recommends admission to medical service for presumed acute cholecystitis and surgery will follow. Patient has some nausea but no vomiting. Has not had any diarrhea. Patient will be admitted therefore for further evaluation and treatment..  ED Course:Temperature 97.9 blood pressure 95/62 pulse 87 respiratory rate of 18 oxygen sat 178. White count is 13.3 hemoglobin 13.2 and platelets 214. Sodium 137 potassium 3.3 chloride 105. CO2 of 18 BUN 33 creatinine 2.03 and calcium 9.0 glucose 106. Viral screen including COVID-19 is negative ultrasound abdomen showed distended gallbladder negative Murphy sign but telltale sign of cholecystitis. Ammonia is 47 AST 378 ALT 294 total bilirubin 4.0 with GFR of 29. Alkaline phosphatase 196. Head CT without contrast is negative. Based on patient's finding surgery consulted and recommends admission for possible surgical evaluation and treatment  Hospital Course:  See above   Consultations: [facility] Palliative care GI   Cultures   12/1 SARS coronavirus negative 12/1 influenza A/B negative 12/7 SARS coronavirus negative 12/7 influenza A/B  negative 12/9 acute hepatitis panel negative  Antibiotics Anti-infectives (From admission, onward)   Start     Ordered Stop   03/15/20  0500  piperacillin-tazobactam (ZOSYN) IVPB 3.375 g  Status:  Discontinued        03/14/20 2121 03/15/20 2025   03/14/20 2100  piperacillin-tazobactam (ZOSYN) IVPB 3.375 g        03/14/20 2052 03/14/20 2200     Discharge Exam: Vitals:   03/19/20 0557 03/19/20 1402 03/19/20 2145 03/20/20 0539  BP: (!) 152/98 138/79 (!) 160/102 (!) 149/90  Pulse: 91 82 94 (!) 102  Resp: 17 18 18 18   Temp: 98.2 F (36.8 C) (!) 97.5 F (36.4 C) 97.8 F (36.6 C) 98.4 F (36.9 C)  TempSrc: Oral Oral Oral Oral  SpO2: 97% 97% 96% 93%  Weight:      Height:        General: A/O x4, No acute respiratory distress Eyes: negative scleral hemorrhage, negative anisocoria, negative icterus ENT: Negative Runny nose, negative gingival bleeding, Neck:  Negative scars, masses, torticollis, lymphadenopathy, JVD Lungs: Clear to auscultation bilaterally without wheezes or crackles Cardiovascular: Regular rate and rhythm without murmur gallop or rub normal S1 and S2   Discharge Instructions   Allergies as of 03/20/2020   No Known Allergies     Medication List    TAKE these medications   acetaminophen 325 MG tablet Commonly known as: TYLENOL Take 2 tablets (650 mg total) by mouth every 6 (six) hours as needed for moderate pain.   diphenoxylate-atropine 2.5-0.025 MG tablet Commonly known as: Lomotil Take 1 tablet by mouth 4 (four) times daily as needed for diarrhea or loose stools.   fluticasone 50 MCG/ACT nasal spray Commonly known as: FLONASE Place 1 spray into both nostrils daily.   glucosamine-chondroitin 500-400 MG tablet Take 1 tablet by mouth daily.   guaiFENesin-dextromethorphan 100-10 MG/5ML syrup Commonly known as: ROBITUSSIN DM Take 5 mLs by mouth every 4 (four) hours as needed for cough (chest congestion).   ibuprofen 200 MG tablet Commonly known as: ADVIL Take 200-400 mg by mouth in the morning and at bedtime.   ondansetron 4 MG tablet Commonly known as: ZOFRAN Take 1 tablet (4 mg  total) by mouth every 6 (six) hours as needed for nausea.   vitamin B-12 1000 MCG tablet Commonly known as: CYANOCOBALAMIN Take 1 tablet (1,000 mcg total) by mouth daily. What changed: when to take this      No Known Allergies  Contact information for after-discharge care    Destination    [facility] .   Service: Skilled Nursing Contact information: [address] [place] [id number] [phone]                   The results of significant diagnostics from this hospitalization (including imaging, microbiology, ancillary and laboratory) are listed below for reference.    Significant Diagnostic Studies: CT Head Wo Contrast  Result Date: 03/14/2020 CLINICAL DATA:  Delirium, weakness, slurred speech, hallucinations, altered mental status, possible UTI EXAM: CT HEAD WITHOUT CONTRAST TECHNIQUE: Contiguous axial images were obtained from the base of the skull through the vertex without intravenous contrast. Sagittal and coronal MPR images reconstructed from axial data set. COMPARISON:  None FINDINGS: Brain: Generalized atrophy. Normal ventricular morphology. No midline shift or mass effect. Small vessel chronic ischemic changes of deep cerebral white matter. No intracranial hemorrhage, mass lesion, evidence of acute infarction, or extra-axial fluid collection. Vascular: Atherosclerotic calcification of internal carotid and vertebral arteries  at skull base Skull: Demineralized but intact Sinuses/Orbits: Clear Other: N/A IMPRESSION: Atrophy with small vessel chronic ischemic changes of deep cerebral white matter. No acute intracranial abnormalities. Electronically Signed   By: Ulyses Southward M.D.   On: 03/14/2020 18:55   US Abdomen Limited  Result Date: 03/14/2020 CLINICAL DATA:  Transaminitis EXAM: ULTRASOUND ABDOMEN LIMITED RIGHT UPPER QUADRANT COMPARISON:  None. FINDINGS: Gallbladder: There is gallbladder sludge with mild gallbladder wall thickening. The  gallbladder wall measures up to approximately 3.4 mm. The sonographic Eulah Pont sign is negative. The gallbladder is distended. Common bile duct: Diameter: 7 mm. Liver: No focal lesion identified. Within normal limits in parenchymal echogenicity. Portal vein is patent on color Doppler imaging with normal direction of blood flow towards the liver. Other: None. IMPRESSION: 1. Distended gallbladder with gallbladder sludge and mild gallbladder wall thickening. However, the sonographic Eulah Pont sign is reported as negative. As such, findings are equivocal for acute cholecystitis. Consider HIDA scan for further evaluation as clinically indicated. 2. The common bile duct measures at the upper limits of normal. Electronically Signed   By: Katherine Mantle M.D.   On: 03/14/2020 18:50    Microbiology: Recent Results (from the past 240 hour(s))  Resp Panel by RT-PCR (Flu A&B, Covid) Nasopharyngeal Swab     Status: None   Collection Time: 03/14/20  6:22 PM   Specimen: Nasopharyngeal Swab; Nasopharyngeal(NP) swabs in vial transport medium  Result Value Ref Range Status   SARS Coronavirus 2 by RT PCR NEGATIVE NEGATIVE Final    Comment: (NOTE) SARS-CoV-2 target nucleic acids are NOT DETECTED.  The SARS-CoV-2 RNA is generally detectable in upper respiratory specimens during the acute phase of infection. The lowest concentration of SARS-CoV-2 viral copies this assay can detect is 138 copies/mL. A negative result does not preclude SARS-Cov-2 infection and should not be used as the sole basis for treatment or other patient management decisions. A negative result may occur with  improper specimen collection/handling, submission of specimen other than nasopharyngeal swab, presence of viral mutation(s) within the areas targeted by this assay, and inadequate number of viral copies(<138 copies/mL). A negative result must be combined with clinical observations, patient history, and epidemiological information. The  expected result is Negative.  Fact Sheet for Patients:  BloggerCourse.com  Fact Sheet for Healthcare Providers:  SeriousBroker.it  This test is no t yet approved or cleared by the [place] FDA and  has been authorized for detection and/or diagnosis of SARS-CoV-2 by FDA under an Emergency Use Authorization (EUA). This EUA will remain  in effect (meaning this test can be used) for the duration of the COVID-19 declaration under Section 564(b)(1) of the Act, 21 U.S.C.section 360bbb-3(b)(1), unless the authorization is terminated  or revoked sooner.       Influenza A by PCR NEGATIVE NEGATIVE Final   Influenza B by PCR NEGATIVE NEGATIVE Final    Comment: (NOTE) The Xpert Xpress SARS-CoV-2/FLU/RSV plus assay is intended as an aid in the diagnosis of influenza from Nasopharyngeal swab specimens and should not be used as a sole basis for treatment. Nasal washings and aspirates are unacceptable for Xpert Xpress SARS-CoV-2/FLU/RSV testing.  Fact Sheet for Patients: BloggerCourse.com  Fact Sheet for Healthcare Providers: SeriousBroker.it  This test is not yet approved or cleared by the [place] FDA and has been authorized for detection and/or diagnosis of SARS-CoV-2 by FDA under an Emergency Use Authorization (EUA). This EUA will remain in effect (meaning this test can be used) for the duration of the COVID-19  declaration under Section 564(b)(1) of the Act, 21 U.S.C. section 360bbb-3(b)(1), unless the authorization is terminated or revoked.  Performed at [facility], [address] [place], [place] [address]   SARS Coronavirus 2 by RT PCR (hospital order, performed in [facility] hospital lab) Nasopharyngeal Nasopharyngeal Swab     Status: None   Collection Time: 03/17/20  2:30 PM   Specimen: Nasopharyngeal Swab  Result Value Ref Range Status   SARS  Coronavirus 2 NEGATIVE NEGATIVE Final    Comment: (NOTE) SARS-CoV-2 target nucleic acids are NOT DETECTED.  The SARS-CoV-2 RNA is generally detectable in upper and lower respiratory specimens during the acute phase of infection. The lowest concentration of SARS-CoV-2 viral copies this assay can detect is 250 copies / mL. A negative result does not preclude SARS-CoV-2 infection and should not be used as the sole basis for treatment or other patient management decisions.  A negative result may occur with improper specimen collection / handling, submission of specimen other than nasopharyngeal swab, presence of viral mutation(s) within the areas targeted by this assay, and inadequate number of viral copies (<250 copies / mL). A negative result must be combined with clinical observations, patient history, and epidemiological information.  Fact Sheet for Patients:   BoilerBrush.com.cy  Fact Sheet for Healthcare Providers: https://pope.com/  This test is not yet approved or  cleared by the [place] FDA and has been authorized for detection and/or diagnosis of SARS-CoV-2 by FDA under an Emergency Use Authorization (EUA).  This EUA will remain in effect (meaning this test can be used) for the duration of the COVID-19 declaration under Section 564(b)(1) of the Act, 21 U.S.C. section 360bbb-3(b)(1), unless the authorization is terminated or revoked sooner.  Performed at [facility], [address], [place], [place] [address]      Labs: Basic Metabolic Panel: Recent Labs  Lab 03/16/20 0457 03/17/20 0533 03/18/20 0538 03/19/20 0514 03/20/20 0450  NA 139 142 143 144 143  K 4.5 3.9 4.2 4.7 4.6  CL 111 111 111 108 108  CO2 GLUCOSE 116* 99 119* 110* 105*  BUN 7* 6*  CREATININE 1.18 0.91 0.97 0.92 0.90  CALCIUM 8.7* 8.8* 8.9 9.1 8.9  MG 1.8 1.9 1.9 1.8 1.8  PHOS 2.4* 3.2 3.3 3.7 3.6    Liver Function Tests: Recent Labs  Lab 03/16/20 0457 03/17/20 0533 03/18/20 0538 03/19/20 0514 03/20/20 0450  AST 103* 60* 39 36 30  ALT 160* 126* 94* 85* 74*  ALKPHOS 145* 155* 130* 128* 122  BILITOT 1.5* 1.5* 0.8 0.9 0.8  PROT 5.3* 5.8* 5.5* 5.8* 5.7*  ALBUMIN 3.0* 3.1* 2.9* 3.1* 3.1*   Recent Labs  Lab 03/14/20 2100  LIPASE 27   Recent Labs  Lab 03/14/20 2100  AMMONIA 47*   CBC: Recent Labs  Lab 03/16/20 0457 03/17/20 0533 03/18/20 0538 03/19/20 0514 03/20/20 0450  WBC 6.8 7.6 6.7 9.8 10.5  NEUTROABS 4.5 4.9 4.1 6.6 7.7  HGB 11.5* 12.3* 11.7* 12.0* 12.1*  HCT 35.6* 38.4* 36.0* 37.9* 39.1  MCV 95.7 97.5 96.3 99.0 100.5*  PLT 185 190 191 205 206   Cardiac Enzymes: Recent Labs  Lab 03/14/20 2100  CKTOTAL 5,854*   BNP: BNP (last 3 results) No results for input(s): BNP in the last 8760 hours.  ProBNP (last 3 results) No results for input(s): PROBNP in the last 8760 hours.  CBG: Recent Labs  Lab 03/14/20 1605 03/15/20  2033  GLUCAP 94 114*       Signed:  Carolyne Littles, MD [facility] [phone] pager

## 2020-03-20 NOTE — Care Management Important Message (Signed)
Important Message  Patient Details IM Letter given to the Patient. Name: Cory Hansen MRN: [id number] Date of Birth: November 14, 1920   Medicare Important Message Given:  Yes     Caren Macadam 03/20/2020, 1:48 PM

## 2020-03-20 NOTE — Progress Notes (Signed)
Physical Therapy Treatment Patient Details Name: Cory Hansen MRN: [id number] DOB: Feb 13, 1921 Today's Date: 03/20/2020    History of Present Illness Patient is a 86 year old male PMH of osteoarthritis and kidney stones, chronic low back pain and hyperlipidemia who presents to the ER with abdominal pain, dark urine, weakness. Admitted with acute cholecystitis.    PT Comments    Patient required increased assist today for bed mobility and transfers with RW. Mod assist required with cues to sequence supine to sit transfer and Mod-Max assist required for sit<>stand from EOB 2x/ He had tendency for posterior lean and cues required for position of feet and anterior chest lean for power up. Mod-Max assist for RW management to guide turn to step to recliner and Max to control sit in recliner. LE exercises completed in sitting as pt's LE's fatigued with prolonged standing during transfer and he was unable to progress to further gait today. Acute PT will continue to progress as able. Recommendation for SNF follow up remains appropriate.   Follow Up Recommendations  SNF     Equipment Recommendations  None recommended by PT    Recommendations for Other Services       Precautions / Restrictions Precautions Precautions: Fall Restrictions Weight Bearing Restrictions: No    Mobility  Bed Mobility Overal bed mobility: Needs Assistance Bed Mobility: Supine to Sit     Supine to sit: Mod assist     General bed mobility comments: HOB elevated, pt required cues to sequence bringing LE's off EOB and to use bed rail. Mod assist to raise trunk upright.  Transfers Overall transfer level: Needs assistance Equipment used: Rolling walker (2 wheeled) Transfers: Sit to/from [employer] Sit to Stand: From elevated surface;Mod assist;Max assist Stand pivot transfers: Max assist;Mod assist       General transfer comment: pt with decreased strength compared to prior sessions. pt  required Mod-Max assist for power up from EOB. Pt with tendency for posterior lean with rising and cues needed for anterior weight shift and to keep feet behind knees prior to stand. pt unable to take steps initially due to posterior lean and seated rest at EOB provided. Tactile/verbal cues to "raise chest toward ceiling" for more upright posture improved pt's balance. Mod assist to manage RW and guid turn to sit in recliner. Max assist to control slow descent to sit in recliner.  Ambulation/Gait                 Stairs             Wheelchair Mobility    Modified Rankin (Stroke Patients Only)       Balance Overall balance assessment: History of Falls;Needs assistance Sitting-balance support: Feet supported Sitting balance-Leahy Scale: Fair     Standing balance support: Bilateral upper extremity supported Standing balance-Leahy Scale: Poor                              Cognition Arousal/Alertness: Awake/alert Behavior During Therapy: WFL for tasks assessed/performed Overall Cognitive Status: Within Functional Limits for tasks assessed                                 General Comments: oriented to place, self. tangential at times but very pleasant and cooperative, follows commands consistently with some extra time      Exercises General Exercises - Lower Extremity Long  Arc Quad: AROM;Both;Seated;15 reps Hip Flexion/Marching: AROM;Both;Seated;15 reps    General Comments        Pertinent Vitals/Pain Pain Assessment: No/denies pain    Home Living                      Prior Function            PT Goals (current goals can now be found in the care plan section) Acute Rehab PT Goals PT Goal Formulation: With patient Time For Goal Achievement: 03/31/20 Potential to Achieve Goals: Good Progress towards PT goals: Not progressing toward goals - comment (pt wtih decreased strength this date)    Frequency    Min  2X/week      PT Plan Current plan remains appropriate    Co-evaluation              AM-PAC PT "6 Clicks" Mobility   Outcome Measure  Help needed turning from your back to your side while in a flat bed without using bedrails?: A Little Help needed moving from lying on your back to sitting on the side of a flat bed without using bedrails?: A Lot Help needed moving to and from a bed to a chair (including a wheelchair)?: A Lot Help needed standing up from a chair using your arms (e.g., wheelchair or bedside chair)?: A Lot Help needed to walk in hospital room?: A Lot Help needed climbing 3-5 steps with a railing? : Total 6 Click Score: 12    End of Session Equipment Utilized During Treatment: Gait belt Activity Tolerance: Patient tolerated treatment well Patient left: in chair;with call bell/phone within reach;with chair alarm set;with family/visitor present Nurse Communication: Mobility status PT Visit Diagnosis: Difficulty in walking, not elsewhere classified (R26.2);Other abnormalities of gait and mobility (R26.89);Muscle weakness (generalized) (M62.81)     Time: 3546-5681 PT Time Calculation (min) (ACUTE ONLY): 30 min  Charges:  $Therapeutic Activity: 23-37 mins                     Wynn Maudlin, DPT Acute Rehabilitation Services  Office [phone] Pager [phone]  03/20/2020 12:18 PM

## 2020-03-22 DIAGNOSIS — R269 Unspecified abnormalities of gait and mobility: Secondary | ICD-10-CM | POA: Diagnosis not present

## 2020-03-22 DIAGNOSIS — R29898 Other symptoms and signs involving the musculoskeletal system: Secondary | ICD-10-CM | POA: Diagnosis not present

## 2020-03-22 DIAGNOSIS — N179 Acute kidney failure, unspecified: Secondary | ICD-10-CM | POA: Diagnosis not present

## 2020-03-22 DIAGNOSIS — R7401 Elevation of levels of liver transaminase levels: Secondary | ICD-10-CM | POA: Diagnosis not present

## 2020-04-05 ENCOUNTER — Encounter: Payer: Self-pay | Admitting: Internal Medicine

## 2020-04-05 DIAGNOSIS — R269 Unspecified abnormalities of gait and mobility: Secondary | ICD-10-CM | POA: Diagnosis not present

## 2020-04-05 DIAGNOSIS — R7401 Elevation of levels of liver transaminase levels: Secondary | ICD-10-CM | POA: Diagnosis not present

## 2020-04-05 DIAGNOSIS — R5381 Other malaise: Secondary | ICD-10-CM | POA: Diagnosis not present

## 2020-04-05 DIAGNOSIS — R0989 Other specified symptoms and signs involving the circulatory and respiratory systems: Secondary | ICD-10-CM | POA: Diagnosis not present

## 2020-04-06 ENCOUNTER — Telehealth: Payer: Self-pay | Admitting: Internal Medicine

## 2020-04-06 ENCOUNTER — Encounter: Payer: Self-pay | Admitting: Internal Medicine

## 2020-04-06 NOTE — Telephone Encounter (Signed)
Pt's daughter states the patient was d/c'd from rehab without having the paperwork completed.  The rehab facility called daughter back & instructed her to come back there tomorrow at 10a to get it completed as the patient did mention while he was there that he wanted to complete DNR paperwork.

## 2020-04-06 NOTE — Telephone Encounter (Signed)
   Brandy from [facility] calling to request Cory Hansen be contacted to discuss DNR form Patient was discharged from rehab today without a signed DNR form Family is wanting form completed in the event there is an issue over the weekend. Patient scheduled to see Dr Okey Dupre 04/10/20  Please advise

## 2020-04-08 DIAGNOSIS — R4781 Slurred speech: Secondary | ICD-10-CM | POA: Diagnosis not present

## 2020-04-08 DIAGNOSIS — M1990 Unspecified osteoarthritis, unspecified site: Secondary | ICD-10-CM | POA: Diagnosis not present

## 2020-04-08 DIAGNOSIS — Z7951 Long term (current) use of inhaled steroids: Secondary | ICD-10-CM | POA: Diagnosis not present

## 2020-04-08 DIAGNOSIS — K810 Acute cholecystitis: Secondary | ICD-10-CM | POA: Diagnosis not present

## 2020-04-08 DIAGNOSIS — Z9181 History of falling: Secondary | ICD-10-CM | POA: Diagnosis not present

## 2020-04-08 DIAGNOSIS — E876 Hypokalemia: Secondary | ICD-10-CM | POA: Diagnosis not present

## 2020-04-08 DIAGNOSIS — E785 Hyperlipidemia, unspecified: Secondary | ICD-10-CM | POA: Diagnosis not present

## 2020-04-08 DIAGNOSIS — M5431 Sciatica, right side: Secondary | ICD-10-CM | POA: Diagnosis not present

## 2020-04-08 DIAGNOSIS — R29898 Other symptoms and signs involving the musculoskeletal system: Secondary | ICD-10-CM | POA: Diagnosis not present

## 2020-04-08 DIAGNOSIS — R269 Unspecified abnormalities of gait and mobility: Secondary | ICD-10-CM | POA: Diagnosis not present

## 2020-04-10 ENCOUNTER — Telehealth: Payer: Self-pay | Admitting: Internal Medicine

## 2020-04-10 ENCOUNTER — Ambulatory Visit: Payer: Medicare Other | Admitting: Internal Medicine

## 2020-04-10 NOTE — Telephone Encounter (Signed)
  HH ORDERS   Caller Name: [facility] Agency Name:  [facility] Callback Phone #: [phone] Service Requested: nurins (examples: OT/PT/Skilled Nursing/Social Work/Speech Therapy/Wound Care) Frequency of Visits: 1w2, 1eow4

## 2020-04-11 DIAGNOSIS — Z7409 Other reduced mobility: Secondary | ICD-10-CM | POA: Diagnosis not present

## 2020-04-11 DIAGNOSIS — E785 Hyperlipidemia, unspecified: Secondary | ICD-10-CM | POA: Diagnosis not present

## 2020-04-11 DIAGNOSIS — R4781 Slurred speech: Secondary | ICD-10-CM | POA: Diagnosis not present

## 2020-04-11 DIAGNOSIS — E876 Hypokalemia: Secondary | ICD-10-CM | POA: Diagnosis not present

## 2020-04-11 DIAGNOSIS — Z9181 History of falling: Secondary | ICD-10-CM | POA: Diagnosis not present

## 2020-04-11 DIAGNOSIS — R269 Unspecified abnormalities of gait and mobility: Secondary | ICD-10-CM | POA: Diagnosis not present

## 2020-04-11 DIAGNOSIS — R531 Weakness: Secondary | ICD-10-CM | POA: Diagnosis not present

## 2020-04-11 DIAGNOSIS — R29898 Other symptoms and signs involving the musculoskeletal system: Secondary | ICD-10-CM | POA: Diagnosis not present

## 2020-04-11 DIAGNOSIS — M1990 Unspecified osteoarthritis, unspecified site: Secondary | ICD-10-CM | POA: Diagnosis not present

## 2020-04-11 DIAGNOSIS — K810 Acute cholecystitis: Secondary | ICD-10-CM | POA: Diagnosis not present

## 2020-04-11 DIAGNOSIS — M5431 Sciatica, right side: Secondary | ICD-10-CM | POA: Diagnosis not present

## 2020-04-11 DIAGNOSIS — Z7951 Long term (current) use of inhaled steroids: Secondary | ICD-10-CM | POA: Diagnosis not present

## 2020-04-11 DIAGNOSIS — R0602 Shortness of breath: Secondary | ICD-10-CM | POA: Diagnosis not present

## 2020-04-11 NOTE — Telephone Encounter (Signed)
Would need virtual visit prior to orders.

## 2020-04-11 NOTE — Telephone Encounter (Signed)
Patient is scheduled with PCP 04/18/20 @ 1:20.

## 2020-04-13 DIAGNOSIS — Z9181 History of falling: Secondary | ICD-10-CM | POA: Diagnosis not present

## 2020-04-13 DIAGNOSIS — Z7951 Long term (current) use of inhaled steroids: Secondary | ICD-10-CM | POA: Diagnosis not present

## 2020-04-13 DIAGNOSIS — K810 Acute cholecystitis: Secondary | ICD-10-CM | POA: Diagnosis not present

## 2020-04-13 DIAGNOSIS — E876 Hypokalemia: Secondary | ICD-10-CM | POA: Diagnosis not present

## 2020-04-13 DIAGNOSIS — M1990 Unspecified osteoarthritis, unspecified site: Secondary | ICD-10-CM | POA: Diagnosis not present

## 2020-04-13 DIAGNOSIS — R29898 Other symptoms and signs involving the musculoskeletal system: Secondary | ICD-10-CM | POA: Diagnosis not present

## 2020-04-13 DIAGNOSIS — M5431 Sciatica, right side: Secondary | ICD-10-CM | POA: Diagnosis not present

## 2020-04-13 DIAGNOSIS — E785 Hyperlipidemia, unspecified: Secondary | ICD-10-CM | POA: Diagnosis not present

## 2020-04-13 DIAGNOSIS — R269 Unspecified abnormalities of gait and mobility: Secondary | ICD-10-CM | POA: Diagnosis not present

## 2020-04-13 DIAGNOSIS — R4781 Slurred speech: Secondary | ICD-10-CM | POA: Diagnosis not present

## 2020-04-15 DIAGNOSIS — R0602 Shortness of breath: Secondary | ICD-10-CM | POA: Diagnosis not present

## 2020-04-15 DIAGNOSIS — R531 Weakness: Secondary | ICD-10-CM | POA: Diagnosis not present

## 2020-04-18 ENCOUNTER — Ambulatory Visit (INDEPENDENT_AMBULATORY_CARE_PROVIDER_SITE_OTHER): Payer: Medicare Other | Admitting: Internal Medicine

## 2020-04-18 ENCOUNTER — Encounter: Payer: Self-pay | Admitting: Internal Medicine

## 2020-04-18 ENCOUNTER — Other Ambulatory Visit: Payer: Self-pay

## 2020-04-18 VITALS — BP 101/60 | HR 72 | Temp 97.6°F | Ht 71.0 in | Wt 173.0 lb

## 2020-04-18 DIAGNOSIS — K810 Acute cholecystitis: Secondary | ICD-10-CM

## 2020-04-18 DIAGNOSIS — R531 Weakness: Secondary | ICD-10-CM | POA: Diagnosis not present

## 2020-04-18 DIAGNOSIS — M1990 Unspecified osteoarthritis, unspecified site: Secondary | ICD-10-CM

## 2020-04-18 DIAGNOSIS — E785 Hyperlipidemia, unspecified: Secondary | ICD-10-CM

## 2020-04-18 DIAGNOSIS — M5431 Sciatica, right side: Secondary | ICD-10-CM

## 2020-04-18 DIAGNOSIS — E876 Hypokalemia: Secondary | ICD-10-CM | POA: Diagnosis not present

## 2020-04-18 DIAGNOSIS — R4781 Slurred speech: Secondary | ICD-10-CM | POA: Diagnosis not present

## 2020-04-18 DIAGNOSIS — Z7951 Long term (current) use of inhaled steroids: Secondary | ICD-10-CM

## 2020-04-18 DIAGNOSIS — R29898 Other symptoms and signs involving the musculoskeletal system: Secondary | ICD-10-CM

## 2020-04-18 DIAGNOSIS — R269 Unspecified abnormalities of gait and mobility: Secondary | ICD-10-CM | POA: Diagnosis not present

## 2020-04-18 DIAGNOSIS — N179 Acute kidney failure, unspecified: Secondary | ICD-10-CM

## 2020-04-18 DIAGNOSIS — Z9181 History of falling: Secondary | ICD-10-CM

## 2020-04-18 LAB — CBC
HCT: 42.9 % (ref 39.0–52.0)
Hemoglobin: 13.8 g/dL (ref 13.0–17.0)
MCHC: 32.2 g/dL (ref 30.0–36.0)
MCV: 94.6 fl (ref 78.0–100.0)
Platelets: 173 10*3/uL (ref 150.0–400.0)
RBC: 4.53 Mil/uL (ref 4.22–5.81)
RDW: 14.3 % (ref 11.5–15.5)
WBC: 6.4 10*3/uL (ref 4.0–10.5)

## 2020-04-18 LAB — COMPREHENSIVE METABOLIC PANEL
ALT: 15 U/L (ref 0–53)
AST: 24 U/L (ref 0–37)
Albumin: 4.1 g/dL (ref 3.5–5.2)
Alkaline Phosphatase: 86 U/L (ref 39–117)
BUN: 21 mg/dL (ref 6–23)
CO2: 27 mEq/L (ref 19–32)
Calcium: 9.8 mg/dL (ref 8.4–10.5)
Chloride: 108 mEq/L (ref 96–112)
Creatinine, Ser: 1.19 mg/dL (ref 0.40–1.50)
GFR: 51.33 mL/min — ABNORMAL LOW (ref >60.00)
Glucose, Bld: 69 mg/dL — ABNORMAL LOW (ref 70–99)
Potassium: 5.3 mEq/L — ABNORMAL HIGH (ref 3.5–5.1)
Sodium: 140 mEq/L (ref 135–145)
Total Bilirubin: 0.7 mg/dL (ref 0.2–1.2)
Total Protein: 7 g/dL (ref 6.0–8.3)

## 2020-04-18 LAB — LIPASE: Lipase: 24 U/L (ref 11.0–59.0)

## 2020-04-18 MED ORDER — PANTOPRAZOLE SODIUM 40 MG PO TBEC: 40.0000 mg | DELAYED_RELEASE_TABLET | Freq: Every day | ORAL | 3 refills | Status: DC

## 2020-04-18 NOTE — Patient Instructions (Addendum)
We will get the rails for the bed.  We are checking the labs.   We have sent in the protonix (pantoprazole) to the mail order for heartburn.  [phone] for the covid-19 booster at home.

## 2020-04-18 NOTE — Progress Notes (Signed)
   Subjective:   Patient ID: Cory Hansen, male    DOB: [birth year], 87 y.o.   MRN: [id number]  HPI The patient is a 87 YO man coming in for rehab follow up (in hospital with cholecystitis and elevated LFTs, no surgery needed, did recover gradually). He did have stay at rehab facility with some improvement. Still doing PT at home. Overall doing well, some weakness. Denies abdominal pain, nausea, vomiting. Denies constipation or diarrhea. Denies blood in stool. Appetite still poor but improving. Drinking plenty of fluids.   PMH, [facility], social history reviewed and updated  Review of Systems  Constitutional: Positive for activity change, appetite change and fatigue.  HENT: Negative.   Eyes: Negative.   Respiratory: Negative for cough, chest tightness and shortness of breath.   Cardiovascular: Negative for chest pain, palpitations and leg swelling.  Gastrointestinal: Negative for abdominal distention, abdominal pain, constipation, diarrhea, nausea and vomiting.  Musculoskeletal: Negative.   Skin: Negative.   Neurological: Negative.   Psychiatric/Behavioral: Negative.     Objective:  Physical Exam Constitutional:      Appearance: He is well-developed and well-nourished.  HENT:     Head: Normocephalic and atraumatic.  Eyes:     Extraocular Movements: EOM normal.  Cardiovascular:     Rate and Rhythm: Normal rate and regular rhythm.  Pulmonary:     Effort: Pulmonary effort is normal. No respiratory distress.     Breath sounds: Normal breath sounds. No wheezing or rales.  Abdominal:     General: Bowel sounds are normal. There is no distension.     Palpations: Abdomen is soft.     Tenderness: There is no abdominal tenderness. There is no rebound.  Musculoskeletal:        General: No edema.     Cervical back: Normal range of motion.  Skin:    General: Skin is warm and dry.  Neurological:     Mental Status: He is alert and oriented to person, place, and time.     Coordination:  Coordination abnormal.     Comments: Wheelchair for ambulation long distances, can transfer  Psychiatric:        Mood and Affect: Mood and affect normal.     Vitals:   04/18/20 1314  BP: 101/60  Pulse: 72  Temp: 97.6 F (36.4 C)  TempSrc: Oral  SpO2: 96%  Weight: 173 lb (78.5 kg)  Height: 5\' 11"  (1.803 m)    This visit occurred during the SARS-CoV-2 public health emergency.  Safety protocols were in place, including screening questions prior to the visit, additional usage of staff PPE, and extensive cleaning of exam room while observing appropriate contact time as indicated for disinfecting solutions.   Assessment & Plan:

## 2020-04-20 DIAGNOSIS — R4781 Slurred speech: Secondary | ICD-10-CM | POA: Diagnosis not present

## 2020-04-20 DIAGNOSIS — Z7951 Long term (current) use of inhaled steroids: Secondary | ICD-10-CM | POA: Diagnosis not present

## 2020-04-20 DIAGNOSIS — R269 Unspecified abnormalities of gait and mobility: Secondary | ICD-10-CM | POA: Diagnosis not present

## 2020-04-20 DIAGNOSIS — M5431 Sciatica, right side: Secondary | ICD-10-CM | POA: Diagnosis not present

## 2020-04-20 DIAGNOSIS — R29898 Other symptoms and signs involving the musculoskeletal system: Secondary | ICD-10-CM | POA: Diagnosis not present

## 2020-04-20 DIAGNOSIS — M1990 Unspecified osteoarthritis, unspecified site: Secondary | ICD-10-CM | POA: Diagnosis not present

## 2020-04-20 DIAGNOSIS — Z9181 History of falling: Secondary | ICD-10-CM | POA: Diagnosis not present

## 2020-04-20 DIAGNOSIS — E876 Hypokalemia: Secondary | ICD-10-CM | POA: Diagnosis not present

## 2020-04-20 DIAGNOSIS — K810 Acute cholecystitis: Secondary | ICD-10-CM | POA: Diagnosis not present

## 2020-04-20 DIAGNOSIS — E785 Hyperlipidemia, unspecified: Secondary | ICD-10-CM | POA: Diagnosis not present

## 2020-04-21 DIAGNOSIS — R269 Unspecified abnormalities of gait and mobility: Secondary | ICD-10-CM | POA: Diagnosis not present

## 2020-04-21 DIAGNOSIS — K810 Acute cholecystitis: Secondary | ICD-10-CM | POA: Diagnosis not present

## 2020-04-21 DIAGNOSIS — M5431 Sciatica, right side: Secondary | ICD-10-CM | POA: Diagnosis not present

## 2020-04-21 DIAGNOSIS — R4781 Slurred speech: Secondary | ICD-10-CM | POA: Diagnosis not present

## 2020-04-21 DIAGNOSIS — E785 Hyperlipidemia, unspecified: Secondary | ICD-10-CM | POA: Diagnosis not present

## 2020-04-21 DIAGNOSIS — E876 Hypokalemia: Secondary | ICD-10-CM | POA: Diagnosis not present

## 2020-04-21 DIAGNOSIS — Z9181 History of falling: Secondary | ICD-10-CM | POA: Diagnosis not present

## 2020-04-21 DIAGNOSIS — M1990 Unspecified osteoarthritis, unspecified site: Secondary | ICD-10-CM | POA: Diagnosis not present

## 2020-04-21 DIAGNOSIS — Z7951 Long term (current) use of inhaled steroids: Secondary | ICD-10-CM | POA: Diagnosis not present

## 2020-04-21 DIAGNOSIS — R29898 Other symptoms and signs involving the musculoskeletal system: Secondary | ICD-10-CM | POA: Diagnosis not present

## 2020-04-21 NOTE — Assessment & Plan Note (Signed)
Still doing PT at home and overall improving. Continue with exercises and care with ambulation and supportive devices.

## 2020-04-21 NOTE — Assessment & Plan Note (Signed)
Checking CMP and CBC for stability. Symptoms are resolving.

## 2020-04-21 NOTE — Assessment & Plan Note (Signed)
Checking CMP and CBC today. Adjust as needed.

## 2020-04-25 NOTE — Telephone Encounter (Signed)
Patients daughter called and was requesting that an order be sent for her father to have half bed rails. She said they can be sent to [facility].

## 2020-04-27 DIAGNOSIS — R29898 Other symptoms and signs involving the musculoskeletal system: Secondary | ICD-10-CM | POA: Diagnosis not present

## 2020-04-27 DIAGNOSIS — M5431 Sciatica, right side: Secondary | ICD-10-CM | POA: Diagnosis not present

## 2020-04-27 DIAGNOSIS — M1990 Unspecified osteoarthritis, unspecified site: Secondary | ICD-10-CM | POA: Diagnosis not present

## 2020-04-27 DIAGNOSIS — R269 Unspecified abnormalities of gait and mobility: Secondary | ICD-10-CM | POA: Diagnosis not present

## 2020-04-27 DIAGNOSIS — E876 Hypokalemia: Secondary | ICD-10-CM | POA: Diagnosis not present

## 2020-04-27 DIAGNOSIS — Z9181 History of falling: Secondary | ICD-10-CM | POA: Diagnosis not present

## 2020-04-27 DIAGNOSIS — K810 Acute cholecystitis: Secondary | ICD-10-CM | POA: Diagnosis not present

## 2020-04-27 DIAGNOSIS — E785 Hyperlipidemia, unspecified: Secondary | ICD-10-CM | POA: Diagnosis not present

## 2020-04-27 DIAGNOSIS — Z7951 Long term (current) use of inhaled steroids: Secondary | ICD-10-CM | POA: Diagnosis not present

## 2020-04-27 DIAGNOSIS — R4781 Slurred speech: Secondary | ICD-10-CM | POA: Diagnosis not present

## 2020-04-28 NOTE — Telephone Encounter (Signed)
Patients daughter calling extremely upset because this still hasn't been done almost a month later, wondering if we can send that in today

## 2020-05-01 NOTE — Telephone Encounter (Addendum)
    Patient's daughter [name] requesting to speak with supervisor Please call [phone]

## 2020-05-02 DIAGNOSIS — Z9181 History of falling: Secondary | ICD-10-CM | POA: Diagnosis not present

## 2020-05-02 DIAGNOSIS — M5431 Sciatica, right side: Secondary | ICD-10-CM | POA: Diagnosis not present

## 2020-05-02 DIAGNOSIS — E876 Hypokalemia: Secondary | ICD-10-CM | POA: Diagnosis not present

## 2020-05-02 DIAGNOSIS — R269 Unspecified abnormalities of gait and mobility: Secondary | ICD-10-CM | POA: Diagnosis not present

## 2020-05-02 DIAGNOSIS — R29898 Other symptoms and signs involving the musculoskeletal system: Secondary | ICD-10-CM | POA: Diagnosis not present

## 2020-05-02 DIAGNOSIS — E785 Hyperlipidemia, unspecified: Secondary | ICD-10-CM | POA: Diagnosis not present

## 2020-05-02 DIAGNOSIS — R4781 Slurred speech: Secondary | ICD-10-CM | POA: Diagnosis not present

## 2020-05-02 DIAGNOSIS — M1990 Unspecified osteoarthritis, unspecified site: Secondary | ICD-10-CM | POA: Diagnosis not present

## 2020-05-02 DIAGNOSIS — K810 Acute cholecystitis: Secondary | ICD-10-CM | POA: Diagnosis not present

## 2020-05-02 DIAGNOSIS — Z7951 Long term (current) use of inhaled steroids: Secondary | ICD-10-CM | POA: Diagnosis not present

## 2020-05-02 NOTE — Telephone Encounter (Signed)
Cory Hansen expressed her concern that the half rails they requested almost 1 month ago have not been called to [facility].  Addressed her concern with review of chart & with CMAs. Called [facility], spoke to [facility] who took verbal order for half rails for bed.  She was able to repeat back order. Updated Cory Hansen on call; verb understanding & expressed appreciation.

## 2020-05-03 ENCOUNTER — Other Ambulatory Visit: Payer: Self-pay

## 2020-05-03 ENCOUNTER — Encounter: Payer: Self-pay | Admitting: Internal Medicine

## 2020-05-03 ENCOUNTER — Telehealth (INDEPENDENT_AMBULATORY_CARE_PROVIDER_SITE_OTHER): Payer: Medicare Other | Admitting: Internal Medicine

## 2020-05-03 DIAGNOSIS — R0602 Shortness of breath: Secondary | ICD-10-CM | POA: Diagnosis not present

## 2020-05-03 MED ORDER — DOXYCYCLINE HYCLATE 100 MG PO TABS: 100.0000 mg | ORAL_TABLET | Freq: Two times a day (BID) | ORAL | 0 refills | Status: DC

## 2020-05-03 MED ORDER — PREDNISONE 20 MG PO TABS: 40.0000 mg | ORAL_TABLET | Freq: Every day | ORAL | 0 refills | Status: DC

## 2020-05-03 NOTE — Progress Notes (Signed)
Virtual Visit via Video Note  I connected with Cory Hansen on 05/03/20 at 10:20 AM EST by a video enabled telemedicine application and verified that I am speaking with the correct person using two identifiers.  The patient and the provider were at separate locations throughout the entire encounter. Patient location: home, Provider location: work   I discussed the limitations of evaluation and management by telemedicine and the availability of in person appointments. The patient expressed understanding and agreed to proceed. The patient and the provider were the only parties present for the visit unless noted in HPI below.  History of Present Illness: The patient is a 87 y.o. man with visit for cough and congestion. Started last week and daughter had same. Denies fevers or chills. No known exposure to covid-19. Having some wheezing and tired more. Seems like he is not improving and is worsening some. Daughter is concerned if he is unable to get well he may have to return to a facility for rehab. They do have home health. Has tried otc cough medicine which helps some with coughing  Observations/Objective: Appearance: normal, breathing appears normal some coughing during visit and appears tired, casual grooming, abdomen does not appear distended, mental status is A and O   Assessment and Plan: See problem oriented charting  Follow Up Instructions: Rx prednisone and doxycycline for possible CAP  I discussed the assessment and treatment plan with the patient. The patient was provided an opportunity to ask questions and all were answered. The patient agreed with the plan and demonstrated an understanding of the instructions.   The patient was advised to call back or seek an in-person evaluation if the symptoms worsen or if the condition fails to improve as anticipated.  [name], MD

## 2020-05-03 NOTE — Assessment & Plan Note (Signed)
Rx prednisone and doxycycline for possible CAP. Can continue otc cough medicine if needed.

## 2020-05-04 DIAGNOSIS — K810 Acute cholecystitis: Secondary | ICD-10-CM | POA: Diagnosis not present

## 2020-05-04 DIAGNOSIS — Z7951 Long term (current) use of inhaled steroids: Secondary | ICD-10-CM | POA: Diagnosis not present

## 2020-05-04 DIAGNOSIS — R29898 Other symptoms and signs involving the musculoskeletal system: Secondary | ICD-10-CM | POA: Diagnosis not present

## 2020-05-04 DIAGNOSIS — Z9181 History of falling: Secondary | ICD-10-CM | POA: Diagnosis not present

## 2020-05-04 DIAGNOSIS — M5431 Sciatica, right side: Secondary | ICD-10-CM | POA: Diagnosis not present

## 2020-05-04 DIAGNOSIS — E785 Hyperlipidemia, unspecified: Secondary | ICD-10-CM | POA: Diagnosis not present

## 2020-05-04 DIAGNOSIS — M1990 Unspecified osteoarthritis, unspecified site: Secondary | ICD-10-CM | POA: Diagnosis not present

## 2020-05-04 DIAGNOSIS — R269 Unspecified abnormalities of gait and mobility: Secondary | ICD-10-CM | POA: Diagnosis not present

## 2020-05-04 DIAGNOSIS — E876 Hypokalemia: Secondary | ICD-10-CM | POA: Diagnosis not present

## 2020-05-04 DIAGNOSIS — R4781 Slurred speech: Secondary | ICD-10-CM | POA: Diagnosis not present

## 2020-05-09 DIAGNOSIS — E876 Hypokalemia: Secondary | ICD-10-CM | POA: Diagnosis not present

## 2020-05-09 DIAGNOSIS — E785 Hyperlipidemia, unspecified: Secondary | ICD-10-CM | POA: Diagnosis not present

## 2020-05-09 DIAGNOSIS — R29898 Other symptoms and signs involving the musculoskeletal system: Secondary | ICD-10-CM | POA: Diagnosis not present

## 2020-05-09 DIAGNOSIS — R269 Unspecified abnormalities of gait and mobility: Secondary | ICD-10-CM | POA: Diagnosis not present

## 2020-05-09 DIAGNOSIS — R4781 Slurred speech: Secondary | ICD-10-CM | POA: Diagnosis not present

## 2020-05-09 DIAGNOSIS — K810 Acute cholecystitis: Secondary | ICD-10-CM | POA: Diagnosis not present

## 2020-05-09 DIAGNOSIS — M5431 Sciatica, right side: Secondary | ICD-10-CM | POA: Diagnosis not present

## 2020-05-09 DIAGNOSIS — Z9181 History of falling: Secondary | ICD-10-CM | POA: Diagnosis not present

## 2020-05-09 DIAGNOSIS — M1990 Unspecified osteoarthritis, unspecified site: Secondary | ICD-10-CM | POA: Diagnosis not present

## 2020-05-09 DIAGNOSIS — Z7951 Long term (current) use of inhaled steroids: Secondary | ICD-10-CM | POA: Diagnosis not present

## 2020-05-12 DIAGNOSIS — Z7409 Other reduced mobility: Secondary | ICD-10-CM | POA: Diagnosis not present

## 2020-05-12 DIAGNOSIS — R531 Weakness: Secondary | ICD-10-CM | POA: Diagnosis not present

## 2020-05-12 DIAGNOSIS — R0602 Shortness of breath: Secondary | ICD-10-CM | POA: Diagnosis not present

## 2020-05-13 DIAGNOSIS — K810 Acute cholecystitis: Secondary | ICD-10-CM | POA: Diagnosis not present

## 2020-05-13 DIAGNOSIS — M1990 Unspecified osteoarthritis, unspecified site: Secondary | ICD-10-CM | POA: Diagnosis not present

## 2020-05-13 DIAGNOSIS — M5431 Sciatica, right side: Secondary | ICD-10-CM | POA: Diagnosis not present

## 2020-05-13 DIAGNOSIS — Z9181 History of falling: Secondary | ICD-10-CM | POA: Diagnosis not present

## 2020-05-13 DIAGNOSIS — R4781 Slurred speech: Secondary | ICD-10-CM | POA: Diagnosis not present

## 2020-05-13 DIAGNOSIS — E876 Hypokalemia: Secondary | ICD-10-CM | POA: Diagnosis not present

## 2020-05-13 DIAGNOSIS — Z7951 Long term (current) use of inhaled steroids: Secondary | ICD-10-CM | POA: Diagnosis not present

## 2020-05-13 DIAGNOSIS — E785 Hyperlipidemia, unspecified: Secondary | ICD-10-CM | POA: Diagnosis not present

## 2020-05-13 DIAGNOSIS — R29898 Other symptoms and signs involving the musculoskeletal system: Secondary | ICD-10-CM | POA: Diagnosis not present

## 2020-05-13 DIAGNOSIS — R269 Unspecified abnormalities of gait and mobility: Secondary | ICD-10-CM | POA: Diagnosis not present

## 2020-05-16 DIAGNOSIS — R0602 Shortness of breath: Secondary | ICD-10-CM | POA: Diagnosis not present

## 2020-05-16 DIAGNOSIS — R531 Weakness: Secondary | ICD-10-CM | POA: Diagnosis not present

## 2020-05-17 DIAGNOSIS — R269 Unspecified abnormalities of gait and mobility: Secondary | ICD-10-CM | POA: Diagnosis not present

## 2020-05-17 DIAGNOSIS — Z7951 Long term (current) use of inhaled steroids: Secondary | ICD-10-CM | POA: Diagnosis not present

## 2020-05-17 DIAGNOSIS — E876 Hypokalemia: Secondary | ICD-10-CM | POA: Diagnosis not present

## 2020-05-17 DIAGNOSIS — E785 Hyperlipidemia, unspecified: Secondary | ICD-10-CM | POA: Diagnosis not present

## 2020-05-17 DIAGNOSIS — Z9181 History of falling: Secondary | ICD-10-CM | POA: Diagnosis not present

## 2020-05-17 DIAGNOSIS — M5431 Sciatica, right side: Secondary | ICD-10-CM | POA: Diagnosis not present

## 2020-05-17 DIAGNOSIS — M1990 Unspecified osteoarthritis, unspecified site: Secondary | ICD-10-CM | POA: Diagnosis not present

## 2020-05-17 DIAGNOSIS — K810 Acute cholecystitis: Secondary | ICD-10-CM | POA: Diagnosis not present

## 2020-05-17 DIAGNOSIS — R29898 Other symptoms and signs involving the musculoskeletal system: Secondary | ICD-10-CM | POA: Diagnosis not present

## 2020-05-17 DIAGNOSIS — R4781 Slurred speech: Secondary | ICD-10-CM | POA: Diagnosis not present

## 2020-05-24 DIAGNOSIS — Z9181 History of falling: Secondary | ICD-10-CM | POA: Diagnosis not present

## 2020-05-24 DIAGNOSIS — R29898 Other symptoms and signs involving the musculoskeletal system: Secondary | ICD-10-CM | POA: Diagnosis not present

## 2020-05-24 DIAGNOSIS — M1990 Unspecified osteoarthritis, unspecified site: Secondary | ICD-10-CM | POA: Diagnosis not present

## 2020-05-24 DIAGNOSIS — E876 Hypokalemia: Secondary | ICD-10-CM | POA: Diagnosis not present

## 2020-05-24 DIAGNOSIS — K810 Acute cholecystitis: Secondary | ICD-10-CM | POA: Diagnosis not present

## 2020-05-24 DIAGNOSIS — E785 Hyperlipidemia, unspecified: Secondary | ICD-10-CM | POA: Diagnosis not present

## 2020-05-24 DIAGNOSIS — Z7951 Long term (current) use of inhaled steroids: Secondary | ICD-10-CM | POA: Diagnosis not present

## 2020-05-24 DIAGNOSIS — M5431 Sciatica, right side: Secondary | ICD-10-CM | POA: Diagnosis not present

## 2020-05-24 DIAGNOSIS — R4781 Slurred speech: Secondary | ICD-10-CM | POA: Diagnosis not present

## 2020-05-24 DIAGNOSIS — R269 Unspecified abnormalities of gait and mobility: Secondary | ICD-10-CM | POA: Diagnosis not present

## 2020-05-31 DIAGNOSIS — R269 Unspecified abnormalities of gait and mobility: Secondary | ICD-10-CM | POA: Diagnosis not present

## 2020-05-31 DIAGNOSIS — R4781 Slurred speech: Secondary | ICD-10-CM | POA: Diagnosis not present

## 2020-05-31 DIAGNOSIS — K810 Acute cholecystitis: Secondary | ICD-10-CM | POA: Diagnosis not present

## 2020-05-31 DIAGNOSIS — Z9181 History of falling: Secondary | ICD-10-CM | POA: Diagnosis not present

## 2020-05-31 DIAGNOSIS — M1990 Unspecified osteoarthritis, unspecified site: Secondary | ICD-10-CM | POA: Diagnosis not present

## 2020-05-31 DIAGNOSIS — M5431 Sciatica, right side: Secondary | ICD-10-CM | POA: Diagnosis not present

## 2020-05-31 DIAGNOSIS — R29898 Other symptoms and signs involving the musculoskeletal system: Secondary | ICD-10-CM | POA: Diagnosis not present

## 2020-05-31 DIAGNOSIS — E785 Hyperlipidemia, unspecified: Secondary | ICD-10-CM | POA: Diagnosis not present

## 2020-05-31 DIAGNOSIS — Z7951 Long term (current) use of inhaled steroids: Secondary | ICD-10-CM | POA: Diagnosis not present

## 2020-05-31 DIAGNOSIS — E876 Hypokalemia: Secondary | ICD-10-CM | POA: Diagnosis not present

## 2020-06-08 DIAGNOSIS — H353134 Nonexudative age-related macular degeneration, bilateral, advanced atrophic with subfoveal involvement: Secondary | ICD-10-CM | POA: Diagnosis not present

## 2020-06-08 DIAGNOSIS — H52203 Unspecified astigmatism, bilateral: Secondary | ICD-10-CM | POA: Diagnosis not present

## 2020-06-08 DIAGNOSIS — H47293 Other optic atrophy, bilateral: Secondary | ICD-10-CM | POA: Diagnosis not present

## 2020-06-08 DIAGNOSIS — Z961 Presence of intraocular lens: Secondary | ICD-10-CM | POA: Diagnosis not present

## 2020-06-08 DIAGNOSIS — H524 Presbyopia: Secondary | ICD-10-CM | POA: Diagnosis not present

## 2020-06-08 DIAGNOSIS — H548 Legal blindness, as defined in USA: Secondary | ICD-10-CM | POA: Diagnosis not present

## 2020-06-09 DIAGNOSIS — R0602 Shortness of breath: Secondary | ICD-10-CM | POA: Diagnosis not present

## 2020-06-09 DIAGNOSIS — R531 Weakness: Secondary | ICD-10-CM | POA: Diagnosis not present

## 2020-06-09 DIAGNOSIS — Z7409 Other reduced mobility: Secondary | ICD-10-CM | POA: Diagnosis not present

## 2020-06-13 DIAGNOSIS — R0602 Shortness of breath: Secondary | ICD-10-CM | POA: Diagnosis not present

## 2020-06-13 DIAGNOSIS — R531 Weakness: Secondary | ICD-10-CM | POA: Diagnosis not present

## 2020-06-19 ENCOUNTER — Inpatient Hospital Stay (HOSPITAL_COMMUNITY)
Admission: EM | Admit: 2020-06-19 | Discharge: 2020-06-29 | DRG: 177 | Disposition: A | Discharge status: Skilled Nursing Facility | Payer: Medicare Other | Source: Ambulatory Visit | Attending: Internal Medicine | Admitting: Internal Medicine

## 2020-06-19 ENCOUNTER — Encounter (HOSPITAL_COMMUNITY): Payer: Self-pay

## 2020-06-19 ENCOUNTER — Encounter: Payer: Self-pay | Admitting: Internal Medicine

## 2020-06-19 ENCOUNTER — Other Ambulatory Visit: Payer: Self-pay

## 2020-06-19 ENCOUNTER — Ambulatory Visit (INDEPENDENT_AMBULATORY_CARE_PROVIDER_SITE_OTHER): Payer: Medicare Other | Admitting: Internal Medicine

## 2020-06-19 ENCOUNTER — Emergency Department (HOSPITAL_COMMUNITY): Payer: Medicare Other

## 2020-06-19 VITALS — BP 132/62 | HR 99 | Temp 97.6°F | Ht 71.0 in | Wt 192.8 lb

## 2020-06-19 DIAGNOSIS — E8770 Fluid overload, unspecified: Secondary | ICD-10-CM

## 2020-06-19 DIAGNOSIS — I5031 Acute diastolic (congestive) heart failure: Secondary | ICD-10-CM | POA: Diagnosis present

## 2020-06-19 DIAGNOSIS — U071 COVID-19: Principal | ICD-10-CM | POA: Diagnosis present

## 2020-06-19 DIAGNOSIS — M129 Arthropathy, unspecified: Secondary | ICD-10-CM | POA: Diagnosis not present

## 2020-06-19 DIAGNOSIS — R410 Disorientation, unspecified: Secondary | ICD-10-CM

## 2020-06-19 DIAGNOSIS — R531 Weakness: Secondary | ICD-10-CM | POA: Diagnosis not present

## 2020-06-19 DIAGNOSIS — I5032 Chronic diastolic (congestive) heart failure: Secondary | ICD-10-CM

## 2020-06-19 DIAGNOSIS — K219 Gastro-esophageal reflux disease without esophagitis: Secondary | ICD-10-CM | POA: Diagnosis not present

## 2020-06-19 DIAGNOSIS — I509 Heart failure, unspecified: Secondary | ICD-10-CM | POA: Diagnosis not present

## 2020-06-19 DIAGNOSIS — Z7401 Bed confinement status: Secondary | ICD-10-CM | POA: Diagnosis not present

## 2020-06-19 DIAGNOSIS — Z743 Need for continuous supervision: Secondary | ICD-10-CM | POA: Diagnosis not present

## 2020-06-19 DIAGNOSIS — R5381 Other malaise: Secondary | ICD-10-CM | POA: Diagnosis not present

## 2020-06-19 DIAGNOSIS — E876 Hypokalemia: Secondary | ICD-10-CM | POA: Diagnosis present

## 2020-06-19 DIAGNOSIS — Z8551 Personal history of malignant neoplasm of bladder: Secondary | ICD-10-CM | POA: Diagnosis not present

## 2020-06-19 DIAGNOSIS — Z89422 Acquired absence of other left toe(s): Secondary | ICD-10-CM | POA: Diagnosis not present

## 2020-06-19 DIAGNOSIS — I5033 Acute on chronic diastolic (congestive) heart failure: Secondary | ICD-10-CM | POA: Diagnosis not present

## 2020-06-19 DIAGNOSIS — M545 Low back pain, unspecified: Secondary | ICD-10-CM | POA: Diagnosis not present

## 2020-06-19 DIAGNOSIS — Z781 Physical restraint status: Secondary | ICD-10-CM | POA: Diagnosis not present

## 2020-06-19 DIAGNOSIS — I480 Paroxysmal atrial fibrillation: Secondary | ICD-10-CM | POA: Diagnosis present

## 2020-06-19 DIAGNOSIS — G8929 Other chronic pain: Secondary | ICD-10-CM | POA: Diagnosis not present

## 2020-06-19 DIAGNOSIS — I340 Nonrheumatic mitral (valve) insufficiency: Secondary | ICD-10-CM | POA: Diagnosis not present

## 2020-06-19 DIAGNOSIS — Z66 Do not resuscitate: Secondary | ICD-10-CM | POA: Diagnosis not present

## 2020-06-19 DIAGNOSIS — U099 Post covid-19 condition, unspecified: Secondary | ICD-10-CM | POA: Diagnosis not present

## 2020-06-19 DIAGNOSIS — M2550 Pain in unspecified joint: Secondary | ICD-10-CM | POA: Diagnosis not present

## 2020-06-19 DIAGNOSIS — I4891 Unspecified atrial fibrillation: Secondary | ICD-10-CM

## 2020-06-19 DIAGNOSIS — M6281 Muscle weakness (generalized): Secondary | ICD-10-CM | POA: Diagnosis not present

## 2020-06-19 DIAGNOSIS — J90 Pleural effusion, not elsewhere classified: Secondary | ICD-10-CM | POA: Diagnosis not present

## 2020-06-19 DIAGNOSIS — I4821 Permanent atrial fibrillation: Secondary | ICD-10-CM | POA: Insufficient documentation

## 2020-06-19 DIAGNOSIS — Z79899 Other long term (current) drug therapy: Secondary | ICD-10-CM

## 2020-06-19 DIAGNOSIS — E785 Hyperlipidemia, unspecified: Secondary | ICD-10-CM | POA: Diagnosis present

## 2020-06-19 DIAGNOSIS — R6889 Other general symptoms and signs: Secondary | ICD-10-CM | POA: Diagnosis not present

## 2020-06-19 DIAGNOSIS — M5441 Lumbago with sciatica, right side: Secondary | ICD-10-CM | POA: Diagnosis not present

## 2020-06-19 DIAGNOSIS — N179 Acute kidney failure, unspecified: Secondary | ICD-10-CM | POA: Diagnosis present

## 2020-06-19 DIAGNOSIS — R0602 Shortness of breath: Secondary | ICD-10-CM | POA: Diagnosis not present

## 2020-06-19 DIAGNOSIS — Z8616 Personal history of COVID-19: Secondary | ICD-10-CM | POA: Diagnosis not present

## 2020-06-19 DIAGNOSIS — I361 Nonrheumatic tricuspid (valve) insufficiency: Secondary | ICD-10-CM | POA: Diagnosis not present

## 2020-06-19 DIAGNOSIS — R404 Transient alteration of awareness: Secondary | ICD-10-CM | POA: Diagnosis not present

## 2020-06-19 DIAGNOSIS — R451 Restlessness and agitation: Secondary | ICD-10-CM | POA: Diagnosis not present

## 2020-06-19 DIAGNOSIS — R0600 Dyspnea, unspecified: Secondary | ICD-10-CM | POA: Diagnosis not present

## 2020-06-19 HISTORY — DX: Unspecified atrial fibrillation: I48.91

## 2020-06-19 LAB — CBC WITH DIFFERENTIAL/PLATELET
Abs Immature Granulocytes: 0 10*3/uL (ref 0.00–0.07)
Basophils Absolute: 0 10*3/uL (ref 0.0–0.1)
Basophils Relative: 1 %
Eosinophils Absolute: 0.3 10*3/uL (ref 0.0–0.5)
Eosinophils Relative: 4 %
HCT: 38.9 % — ABNORMAL LOW (ref 39.0–52.0)
Hemoglobin: 12 g/dL — ABNORMAL LOW (ref 13.0–17.0)
Immature Granulocytes: 0 %
Lymphocytes Relative: 20 %
Lymphs Abs: 1.2 10*3/uL (ref 0.7–4.0)
MCH: 30.4 pg (ref 26.0–34.0)
MCHC: 30.8 g/dL (ref 30.0–36.0)
MCV: 98.5 fL (ref 80.0–100.0)
Monocytes Absolute: 0.7 10*3/uL (ref 0.1–1.0)
Monocytes Relative: 11 %
Neutro Abs: 3.9 10*3/uL (ref 1.7–7.7)
Neutrophils Relative %: 64 %
Platelets: 164 10*3/uL (ref 150–400)
RBC: 3.95 MIL/uL — ABNORMAL LOW (ref 4.22–5.81)
RDW: 16.3 % — ABNORMAL HIGH (ref 11.5–15.5)
WBC: 6.1 10*3/uL (ref 4.0–10.5)
nRBC: 0 % (ref 0.0–0.2)

## 2020-06-19 LAB — BASIC METABOLIC PANEL
Anion gap: 13 (ref 5–15)
BUN: 21 mg/dL (ref 8–23)
CO2: 22 mmol/L (ref 22–32)
Calcium: 9.4 mg/dL (ref 8.9–10.3)
Chloride: 110 mmol/L (ref 98–111)
Creatinine, Ser: 1.32 mg/dL — ABNORMAL HIGH (ref 0.61–1.24)
GFR, Estimated: 49 mL/min — ABNORMAL LOW (ref >60)
Glucose, Bld: 95 mg/dL (ref 70–99)
Potassium: 4.2 mmol/L (ref 3.5–5.1)
Sodium: 145 mmol/L (ref 135–145)

## 2020-06-19 LAB — TROPONIN I (HIGH SENSITIVITY)
Troponin I (High Sensitivity): 10 ng/L (ref <18)
Troponin I (High Sensitivity): 9 ng/L (ref <18)

## 2020-06-19 LAB — TSH: TSH: 2.908 u[IU]/mL (ref 0.350–4.500)

## 2020-06-19 LAB — PROTIME-INR
INR: 1.1 (ref 0.8–1.2)
Prothrombin Time: 14.2 seconds (ref 11.4–15.2)

## 2020-06-19 LAB — BRAIN NATRIURETIC PEPTIDE: B Natriuretic Peptide: 472.5 pg/mL — ABNORMAL HIGH (ref 0.0–100.0)

## 2020-06-19 LAB — GLUCOSE, CAPILLARY: Glucose-Capillary: 106 mg/dL — ABNORMAL HIGH (ref 70–99)

## 2020-06-19 LAB — MAGNESIUM: Magnesium: 2.1 mg/dL (ref 1.7–2.4)

## 2020-06-19 MED ORDER — PANTOPRAZOLE SODIUM 40 MG PO TBEC
40.0000 mg | DELAYED_RELEASE_TABLET | Freq: Every day | ORAL | Status: DC
  Administered 2020-06-21 – 2020-06-29 (×9): 40 mg via ORAL
  Filled 2020-06-19 (×10): qty 1

## 2020-06-19 MED ORDER — APIXABAN 2.5 MG PO TABS: 2.5000 mg | ORAL_TABLET | Freq: Two times a day (BID) | ORAL | Status: DC

## 2020-06-19 MED ORDER — ACETAMINOPHEN 650 MG RE SUPP: 650.0000 mg | Freq: Four times a day (QID) | RECTAL | Status: DC | PRN

## 2020-06-19 MED ORDER — SODIUM CHLORIDE 0.9% FLUSH
3.0000 mL | Freq: Two times a day (BID) | INTRAVENOUS | Status: DC
  Administered 2020-06-19 – 2020-06-28 (×19): 3 mL via INTRAVENOUS

## 2020-06-19 MED ORDER — HALOPERIDOL LACTATE 5 MG/ML IJ SOLN
2.0000 mg | Freq: Four times a day (QID) | INTRAMUSCULAR | Status: DC | PRN
Start: 2020-06-19 — End: 2020-06-29
  Administered 2020-06-19: 2 mg via INTRAVENOUS

## 2020-06-19 MED ORDER — POLYETHYLENE GLYCOL 3350 17 G PO PACK
17.0000 g | PACK | Freq: Every day | ORAL | Status: DC | PRN
Start: 2020-06-19 — End: 2020-06-29
  Administered 2020-06-25: 17 g via ORAL
  Filled 2020-06-19: qty 1

## 2020-06-19 MED ORDER — HALOPERIDOL LACTATE 5 MG/ML IJ SOLN
INTRAMUSCULAR | Status: AC
  Filled 2020-06-19: qty 1

## 2020-06-19 MED ORDER — LORAZEPAM 2 MG/ML IJ SOLN
0.5000 mg | Freq: Once | INTRAMUSCULAR | Status: AC | PRN
  Administered 2020-06-20: 0.5 mg via INTRAVENOUS
  Filled 2020-06-19: qty 1

## 2020-06-19 MED ORDER — FUROSEMIDE 10 MG/ML IJ SOLN
40.0000 mg | Freq: Once | INTRAMUSCULAR | Status: AC
  Administered 2020-06-19: 40 mg via INTRAVENOUS
  Filled 2020-06-19: qty 4

## 2020-06-19 MED ORDER — ENOXAPARIN SODIUM 40 MG/0.4ML ~~LOC~~ SOLN: 40.0000 mg | SUBCUTANEOUS | Status: DC

## 2020-06-19 MED ORDER — FUROSEMIDE 10 MG/ML IJ SOLN
40.0000 mg | Freq: Two times a day (BID) | INTRAMUSCULAR | Status: DC
  Administered 2020-06-20 – 2020-06-21 (×3): 40 mg via INTRAVENOUS
  Filled 2020-06-19 (×3): qty 4

## 2020-06-19 MED ORDER — APIXABAN 5 MG PO TABS
5.0000 mg | ORAL_TABLET | Freq: Two times a day (BID) | ORAL | Status: DC
  Administered 2020-06-20 – 2020-06-29 (×19): 5 mg via ORAL
  Filled 2020-06-19 (×11): qty 1
  Filled 2020-06-19: qty 2
  Filled 2020-06-19 (×8): qty 1

## 2020-06-19 MED ORDER — ACETAMINOPHEN 325 MG PO TABS
650.0000 mg | ORAL_TABLET | Freq: Four times a day (QID) | ORAL | Status: DC | PRN
  Administered 2020-06-20 – 2020-06-23 (×2): 650 mg via ORAL
  Filled 2020-06-19 (×2): qty 2

## 2020-06-19 MED ORDER — ADULT MULTIVITAMIN W/MINERALS CH
1.0000 | ORAL_TABLET | Freq: Every day | ORAL | Status: DC
  Administered 2020-06-21 – 2020-06-29 (×9): 1 via ORAL
  Filled 2020-06-19 (×10): qty 1

## 2020-06-19 MED ORDER — METOPROLOL TARTRATE 5 MG/5ML IV SOLN
5.0000 mg | INTRAVENOUS | Status: DC | PRN
  Administered 2020-06-20: 5 mg via INTRAVENOUS
  Filled 2020-06-19 (×2): qty 5

## 2020-06-19 NOTE — Plan of Care (Signed)
  Problem: Education: Goal: Knowledge of General Education information will improve Description: Including pain rating scale, medication(s)/side effects and non-pharmacologic comfort measures Outcome: Progressing   Problem: Coping: Goal: Level of anxiety will decrease Outcome: Progressing   Problem: Pain Managment: Goal: General experience of comfort will improve Outcome: Progressing   

## 2020-06-19 NOTE — Progress Notes (Addendum)
Cross-coverage note:   Patient with hyperactive delirium, unable to be redirected, removing monitoring equipment and out of bed. Family unable to be reached. Failing non-pharmacologic interventions. Plan for low-dose Haldol now, delirium precautions, safety sitter.   ADDENDUM: Remained agitated after Haldol, daughter now in room trying to help redirect him. Had to use soft-restraints for patient and staff safety, will d/c as soon as possible. After discussion with daughter, will try a low dose of Ativan x1. His a fib has become rapid, sustaining in 120-130s with normal BP, planning to give IV Lopressor.

## 2020-06-19 NOTE — H&P (Signed)
History and Physical   Cory Hansen EAV:[id number] DOB: [birth year] DOA: 06/19/2020  PCP: Cory Hansen, Elizabeth A, MD   Patient coming from: Home via PCP office  Chief Complaint: Shortness of breath and edema  HPI: Cory Hansen is a 87 y.o. male with medical history significant of arthritis, chronic low back pain, hyperlipidemia, history of bladder cancer who presents with ongoing shortness of breath and edema.  Patient states that he has had a weight gain of about 20 pounds in the last few weeks.  He is also been experiencing increasing lower extremity edema for the past 1 to 2 weeks.  And in the past 2 days he has had some increasing shortness of breath.  Due to his symptoms he went to see his PCP today who found that he had new onset A. fib and he was sent to the ED for further evaluation.  Daughter reports some upper airway wheezes that seem to come from his there.  Patient denies fevers, chills, chest pain, abdominal pain, constipation, diarrhea, nausea, vomiting.  ED Course: Vital signs in the ED were stable.  Lab work-up showed BMP with creatinine of 1.32 which is elevated from baseline of around 1.0.  CBC showed hemoglobin stable at 12.  PT and INR within normal limits.  Troponin negative with repeat pending.  BNP elevated at 472.  Respiratory panel for flu and Covid pending.  Chest x-ray showed small bilateral pleural effusions with fluid in the fissures.  Chronic calcified plaques also noted.  Heart size was to be on the upper limit of normal.  Patient received 40 mg IV Lasix in ED.  Review of Systems: As per HPI otherwise all other systems reviewed and are negative.  Past Medical History:  Diagnosis Date  . Arthritis   . Atrial fibrillation (HCC)   . Kidney stones   . Shingles     Past Surgical History:  Procedure Laterality Date  . AMPUTATION Left 02/22/2014   Procedure: INCISION AND DRAINAGE LEFT FOREFOOT AMPUTATION FIRST RAY;  Surgeon: Toni ArthursJohn Hewitt, MD;  Location: [facility] OR;   Service: Orthopedics;  Laterality: Left;  . APPENDECTOMY    . EYE SURGERY Bilateral    cataract surgery w/ lens implant  . HERNIA REPAIR     inguinal hernia repair    Social History  reports that he has never smoked. He has never used smokeless tobacco. He reports current alcohol use. He reports that he does not use drugs.  No Known Allergies  History reviewed. No pertinent family history.  Negative for any history of heart disease in his mother or father. Reviewed on admission  Prior to Admission medications   Medication Sig Start Date End Date Taking? Authorizing Provider  diphenhydrAMINE HCl (ALKA-SELTZER PLUS ALLERGY PO) Take 1 tablet by mouth as needed.    [provider]  fluticasone (FLONASE) 50 MCG/ACT nasal spray Place 1 spray into both nostrils as needed.    [provider]  glucosamine-chondroitin 500-400 MG tablet Take 1 tablet by mouth daily.     [provider]  guaiFENesin-dextromethorphan (ROBITUSSIN DM) 100-10 MG/5ML syrup Take 5 mLs by mouth every 4 (four) hours as needed for cough (chest congestion). 03/20/20   Drema DallasWoods, [name], MD  ibuprofen (ADVIL) 200 MG tablet Take 200-400 mg by mouth in the morning and at bedtime.    [provider]  Multiple Vitamin (MULTIVITAMIN PO) Take 1 each by mouth daily.    [provider]  pantoprazole (PROTONIX) 40 MG tablet Take  1 tablet (40 mg total) by mouth daily. 04/18/20   Cory Broker, MD  vitamin B-12 (CYANOCOBALAMIN) 1000 MCG tablet Take 1 tablet (1,000 mcg total) by mouth daily. 10/14/14   Cory Broker, MD    Physical Exam: Vitals:   06/19/20 1715 06/19/20 1728 06/19/20 1800 06/19/20 1830  BP: (!) 142/109 (!) 157/98 (!) 144/100 (!) 141/82  Pulse:  68 70 66  Resp: (!) 22 (!) 21 18 18   Temp:  (!) 97.5 F (36.4 C)    TempSrc:  Oral    SpO2:  93% 93% 94%   Physical Exam Constitutional:      General: He is not in acute distress.    Appearance: Normal  appearance.  HENT:     Head: Normocephalic and atraumatic.     Mouth/Throat:     Mouth: Mucous membranes are moist.     Pharynx: Oropharynx is clear.  Eyes:     Extraocular Movements: Extraocular movements intact.     Pupils: Pupils are equal, round, and reactive to light.  Cardiovascular:     Rate and Rhythm: Normal rate. Rhythm irregular.     Pulses: Normal pulses.     Heart sounds: Normal heart sounds.  Pulmonary:     Effort: Pulmonary effort is normal. No respiratory distress.     Breath sounds: Rales present.  Abdominal:     General: Bowel sounds are normal. There is no distension.     Palpations: Abdomen is soft.     Tenderness: There is no abdominal tenderness.  Musculoskeletal:        General: No swelling or deformity.  Skin:    General: Skin is warm and dry.  Neurological:     General: No focal deficit present.     Mental Status: Mental status is at baseline.    Labs on Admission: I have personally reviewed following labs and imaging studies  CBC: Recent Labs  Lab 06/19/20 1715  WBC 6.1  NEUTROABS 3.9  HGB 12.0*  HCT 38.9*  MCV 98.5  PLT 164    Basic Metabolic Panel: Recent Labs  Lab 06/19/20 1715  NA 145  K 4.2  CL 110  CO2 22  GLUCOSE 95  BUN 21  CREATININE 1.32*  CALCIUM 9.4    GFR: Estimated Creatinine Clearance: 34.1 mL/min (A) (by C-G formula based on SCr of 1.32 mg/dL (H)).  Liver Function Tests: No results for input(s): AST, ALT, ALKPHOS, BILITOT, PROT, ALBUMIN in the last 168 hours.  Urine analysis:    Component Value Date/Time   COLORURINE AMBER (A) 03/14/2020 2300   APPEARANCEUR HAZY (A) 03/14/2020 2300   LABSPEC 1.014 03/14/2020 2300   PHURINE 5.0 03/14/2020 2300   GLUCOSEU NEGATIVE 03/14/2020 2300   GLUCOSEU NEGATIVE 10/31/2017 1407   HGBUR MODERATE (A) 03/14/2020 2300   BILIRUBINUR NEGATIVE 03/14/2020 2300   KETONESUR NEGATIVE 03/14/2020 2300   PROTEINUR 30 (A) 03/14/2020 2300   UROBILINOGEN 0.2 10/31/2017 1407    NITRITE NEGATIVE 03/14/2020 2300   LEUKOCYTESUR NEGATIVE 03/14/2020 2300    Radiological Exams on Admission: DG Chest 2 View  Result Date: 06/19/2020 CLINICAL DATA:  Shortness of breath. Bilateral lower extremity swelling. No onset atrial fibrillation. EXAM: CHEST - 2 VIEW COMPARISON:  Radiograph 08/28/2009 FINDINGS: Upper normal heart size. Normal mediastinal contours with aortic atherosclerosis. There are bilateral calcified pleural plaques, also seen on prior exam. Small pleural effusions with fluid in the fissures. No pulmonary edema. No pneumothorax. No acute osseous abnormalities are seen. IMPRESSION: 1.  Small pleural effusions with fluid in the fissures. 2. Upper normal heart size.  Aortic Atherosclerosis (ICD10-I70.0). 3. Chronic bilateral calcified pleural plaques. Electronically Signed   By: Narda Rutherford M.D.   On: 06/19/2020 16:56    EKG: Independently reviewed.  Atrial fibrillation at 100 bpm.  PVCs noted.  T wave flattening in inferior leads which is similar to previous EKG.  A. fib is new from previous EKG.  Assessment/Plan Principal Problem:   Acute CHF (HCC) Active Problems:   AKI (acute kidney injury) (HCC)   New onset atrial fibrillation (HCC)  New onset heart failure > Patient presents with weight gain, edema, shortness of breath which has been progressive. > BNP elevated to 472 in the ED and chest x-ray with small pleural effusions and fluid in the fissure. > This is in the setting of new onset A. fib as below which is likely contributing. - Monitor on telemetry - Continue with 40 mg IV Lasix twice daily - Strict I/O, daily weights - We will check magnesium - Echocardiogram - Trend renal function electrolytes  New onset atrial fibrillation > Patient found to have new onset A. fib at evaluation by PCP today.  Persistent in the ED. Unclear how long this has been going on. > As above also found to have likely new onset heart failure.  Will obtain  echocardiogram. - Rate remains controlled for now will consider beta-blocker after echocardiogram evaluation of ventricular function. -Patient has has bled of 1 only due to his age.  He walks with a walker or uses a [employer]; family states that he does not have falls as there is always someone with him out of his 6 children.. - Check TSH - Due to age start low-dose Eliquis 2.5 mg twice daily - May benefit from referral to A. fib clinic on discharge  AKI > creatinine elevated to 1.32 up from a baseline of around 1.0. > In the setting of heart failure as above with fluid overload - Continue diuresis as above - Avoid nephrotoxic agents - Trend renal function and electrolytes  DVT prophylaxis: Eliquis Code Status:   DNR  Family Communication:  Family updated at bedside  Disposition Plan:   Patient is from:  Home  Anticipated DC to:  Home  Anticipated DC date:  1 to 3 days  Anticipated DC barriers: None  Consults called:  None  Admission status:  Observation, telemetry   Severity of Illness: The appropriate patient status for this patient is OBSERVATION. Observation status is judged to be reasonable and necessary in order to provide the required intensity of service to ensure the patient's safety. The patient's presenting symptoms, physical exam findings, and initial radiographic and laboratory data in the context of their medical condition is felt to place them at decreased risk for further clinical deterioration. Furthermore, it is anticipated that the patient will be medically stable for discharge from the hospital within 2 midnights of admission. The following factors support the patient status of observation.   " The patient's presenting symptoms include shortness of breath, edema, weight gain. " The physical exam findings include irregular heart rhythm, lower extremity edema, rales. " The initial radiographic and laboratory data are chest x-ray with small pleural effusions and fluid  in the fissure, creatinine 1.32 up from 1.0.  Hemoglobin of 12.  BNP elevated at 472.Marland Kitchen   Synetta Fail MD [facility]  How to contact the [facility] Attending or Consulting provider 7A - 7P or covering provider during after hours  7P -7A, for this patient?   1. Check the care team in [facility] and look for a) attending/consulting [facility] provider listed and b) the [facility] team listed 2. Log into [email or web address] and use [facility]'s universal password to access. If you do not have the password, please contact the hospital operator. 3. Locate the [facility] provider you are looking for under [facility] and page to a number that you can be directly reached. 4. If you still have difficulty reaching the provider, please page the [facility] (Director on Call) for the Hospitalists listed on amion for assistance.  06/19/2020, 7:50 PM

## 2020-06-19 NOTE — Progress Notes (Signed)
   Subjective:   Patient ID: Cory Hansen, male    DOB: [birth year], 87 y.o.   MRN: [id number]  HPI The patient is a 87 YO man coming in for concerns about SOB/wheezing (denies chest pains or heart palpitations, SOB with exertion, denies worsening SOB with lying flat, legs are swollen, cough but mostly non-productive, sounds to have a loud wheeze with movement)  and legs swollen (in the last week or so, not elevating legs, denies change in diet, denies medication change, weight is up 20 pounds) and weight gain (20 pounds in the past 2 months, denies increase in appetite).   PMH, [facility], social history reviewed and updated  Review of Systems  Constitutional: Positive for activity change, fatigue and unexpected weight change.  HENT: Negative.   Eyes: Negative.   Respiratory: Positive for shortness of breath and wheezing. Negative for cough and chest tightness.   Cardiovascular: Positive for leg swelling. Negative for chest pain and palpitations.  Gastrointestinal: Negative for abdominal distention, abdominal pain, constipation, diarrhea, nausea and vomiting.  Musculoskeletal: Negative.   Skin: Negative.   Neurological: Negative.   Psychiatric/Behavioral: Negative.     Objective:  Physical Exam Constitutional:      Appearance: He is well-developed.  HENT:     Head: Normocephalic and atraumatic.  Cardiovascular:     Rate and Rhythm: Normal rate. Rhythm irregular.  Pulmonary:     Effort: Pulmonary effort is normal. No respiratory distress.     Breath sounds: No wheezing or rales.     Comments: Poor air movement Abdominal:     General: Bowel sounds are normal. There is no distension.     Palpations: Abdomen is soft.     Tenderness: There is no abdominal tenderness. There is no rebound.  Musculoskeletal:     Cervical back: Normal range of motion.  Skin:    General: Skin is warm and dry.  Neurological:     Mental Status: He is alert and oriented to person, place, and time.      Coordination: Coordination abnormal.     Comments: wheelchair     Vitals:   06/19/20 1446  BP: 132/62  Pulse: 99  Temp: 97.6 F (36.4 C)  TempSrc: Oral  SpO2: 94%  Weight: 192 lb 12.8 oz (87.5 kg)  Height: 5\' 11"  (1.803 m)   EKG: Rate 95, axis normal, interval normal, appears to be a fib, no st or t wave changes, new A fib since 2019  This visit occurred during the SARS-CoV-2 public health emergency.  Safety protocols were in place, including screening questions prior to the visit, additional usage of staff PPE, and extensive cleaning of exam room while observing appropriate contact time as indicated for disinfecting solutions.   Assessment & Plan:

## 2020-06-19 NOTE — Assessment & Plan Note (Signed)
Suspect pulmonary edema and needs ER for treatment and likely diuresis. With new A fib. 20 pound weight gain since Jan 2022.

## 2020-06-19 NOTE — ED Notes (Signed)
Pt daughter took pt's cloths with her.

## 2020-06-19 NOTE — ED Provider Notes (Signed)
[facility]-EMERGENCY DEPT Provider Note   CSN: [id number] Arrival date & time: 06/19/20  1611     History Chief Complaint  Patient presents with  . new onset atrial fib  . Leg Swelling    Cory Hansen is a 87 y.o. male.  Presented to the emergency room with concern for multiple complaints.  Over the past few weeks, patient has had significant weight gain, approximately 20 pounds.  Patient has been feeling more short of breath lately, worse with exertion, improved with rest.  Currently mild.  Also over the last couple weeks has noted increase in his leg swelling.  No numbness, weakness, skin color changes.  Swelling is equal in both legs.  Went to his primary care doctor today, was told he has A. fib.  Reports this is a new problem.  Additional history obtained from patient's daughter at bedside.  Initial history obtained from chart review.  HPI     Past Medical History:  Diagnosis Date  . Arthritis   . Atrial fibrillation (HCC)   . Kidney stones   . Shingles     Patient Active Problem List   Diagnosis Date Noted  . New onset atrial fibrillation (HCC) 06/19/2020  . Hypokalemia 03/14/2020  . SOB (shortness of breath) 08/19/2019  . Acute right ankle pain 05/07/2019  . Diarrhea 10/27/2015  . Urinary dribbling 07/26/2015  . Routine general medical examination at a health care facility 10/13/2014  . CT, CHEST, ABNORMAL 01/23/2009  . Chronic bilateral low back pain with right-sided sciatica 01/17/2009  . NEOPLASM, MALIGNANT, BLADDER, HX OF 01/17/2009  . Hx of benign neoplasm of prostate 01/17/2009  . Hyperlipidemia 01/16/2009  . ARTHRITIS 01/16/2009  . Weakness 01/16/2009    Past Surgical History:  Procedure Laterality Date  . AMPUTATION Left 02/22/2014   Procedure: INCISION AND DRAINAGE LEFT FOREFOOT AMPUTATION FIRST RAY;  Surgeon: Toni Arthurs, MD;  Location: [facility] OR;  Service: Orthopedics;  Laterality: Left;  . APPENDECTOMY    . EYE SURGERY  Bilateral    cataract surgery w/ lens implant  . HERNIA REPAIR     inguinal hernia repair       History reviewed. No pertinent family history.  Social History   Tobacco Use  . Smoking status: Never Smoker  . Smokeless tobacco: Never Used  Vaping Use  . Vaping Use: Never used  Substance Use Topics  . Alcohol use: Yes    Alcohol/week: 0.0 standard drinks    Comment: very rare  . Drug use: No    Home Medications Prior to Admission medications   Medication Sig Start Date End Date Taking? Authorizing Provider  diphenhydrAMINE HCl (ALKA-SELTZER PLUS ALLERGY PO) Take 1 tablet by mouth as needed.    [provider]  fluticasone (FLONASE) 50 MCG/ACT nasal spray Place 1 spray into both nostrils as needed.    [provider]  glucosamine-chondroitin 500-400 MG tablet Take 1 tablet by mouth daily.     [provider]  guaiFENesin-dextromethorphan (ROBITUSSIN DM) 100-10 MG/5ML syrup Take 5 mLs by mouth every 4 (four) hours as needed for cough (chest congestion). 03/20/20   Drema Dallas, MD  ibuprofen (ADVIL) 200 MG tablet Take 200-400 mg by mouth in the morning and at bedtime.    [provider]  Multiple Vitamin (MULTIVITAMIN PO) Take 1 each by mouth daily.    [provider]  pantoprazole (PROTONIX) 40 MG tablet Take 1 tablet (40 mg total) by mouth daily. 04/18/20  [name], MD  vitamin B-12 (CYANOCOBALAMIN) 1000 MCG tablet Take 1 tablet (1,000 mcg total) by mouth daily. 10/14/14   [name], MD    Allergies    Patient has no known allergies.  Review of Systems   Review of Systems  Constitutional: Negative for chills and fever.  HENT: Negative for ear pain and sore throat.   Eyes: Negative for pain and visual disturbance.  Respiratory: Negative for cough and shortness of breath.   Cardiovascular: Positive for leg swelling. Negative for chest pain and palpitations.  Gastrointestinal: Negative for abdominal pain  and vomiting.  Genitourinary: Negative for dysuria and hematuria.  Musculoskeletal: Positive for arthralgias. Negative for back pain.  Skin: Negative for color change and rash.  Neurological: Negative for seizures and syncope.  All other systems reviewed and are negative.   Physical Exam Updated Vital Signs BP (!) 141/82   Pulse 66   Temp (!) 97.5 F (36.4 C) (Oral)   Resp 18   SpO2 94%   Physical Exam Vitals and nursing note reviewed.  Constitutional:      Appearance: He is well-developed.  HENT:     Head: Normocephalic and atraumatic.  Eyes:     Conjunctiva/sclera: Conjunctivae normal.  Cardiovascular:     Rate and Rhythm: Normal rate and regular rhythm.     Heart sounds: No murmur heard.   Pulmonary:     Comments: Mild tachypnea, speaking in slightly shortened phrases but no frank respiratory distress, mild crackles at bases Abdominal:     Palpations: Abdomen is soft.     Tenderness: There is no abdominal tenderness.  Musculoskeletal:     Cervical back: Neck supple.     Comments: Bilateral pitting edema in lower legs, extremities are warm, well-perfused, normal DP pulse bilaterally  Skin:    General: Skin is warm and dry.  Neurological:     Mental Status: He is alert.     ED Results / Procedures / Treatments   Labs (all labs ordered are listed, but only abnormal results are displayed) Labs Reviewed  CBC WITH DIFFERENTIAL/PLATELET - Abnormal; Notable for the following components:      Result Value   RBC 3.95 (*)    Hemoglobin 12.0 (*)    HCT 38.9 (*)    RDW 16.3 (*)    All other components within normal limits  BASIC METABOLIC PANEL - Abnormal; Notable for the following components:   Creatinine, Ser 1.32 (*)    GFR, Estimated 49 (*)    All other components within normal limits  PROTIME-INR  BRAIN NATRIURETIC PEPTIDE  TROPONIN I (HIGH SENSITIVITY)  TROPONIN I (HIGH SENSITIVITY)    EKG EKG Interpretation  Date/Time:  Monday June 19 2020 16:23:19  EDT Ventricular Rate:  100 PR Interval:    QRS Duration: 96 QT Interval:  388 QTC Calculation: 451 R Axis:   21 Text Interpretation: Atrial fibrillation Low voltage, precordial leads Borderline T abnormalities, inferior leads 12 Lead; Mason-Likar when compared to prior, afib is new Confirmed by Marianna Fuss ([id number]) on 06/19/2020 5:04:15 PM   Radiology DG Chest 2 View  Result Date: 06/19/2020 CLINICAL DATA:  Shortness of breath. Bilateral lower extremity swelling. No onset atrial fibrillation. EXAM: CHEST - 2 VIEW COMPARISON:  Radiograph 08/28/2009 FINDINGS: Upper normal heart size. Normal mediastinal contours with aortic atherosclerosis. There are bilateral calcified pleural plaques, also seen on prior exam. Small pleural effusions with fluid in the fissures. No pulmonary edema. No pneumothorax. No acute osseous abnormalities  are seen. IMPRESSION: 1. Small pleural effusions with fluid in the fissures. 2. Upper normal heart size.  Aortic Atherosclerosis (ICD10-I70.0). 3. Chronic bilateral calcified pleural plaques. Electronically Signed   By: Narda Rutherford M.D.   On: 06/19/2020 16:56    Procedures Procedures   Medications Ordered in ED Medications  furosemide (LASIX) injection 40 mg (has no administration in time range)    ED Course  I have reviewed the triage vital signs and the nursing notes.  Pertinent labs & imaging results that were available during my care of the patient were reviewed by me and considered in my medical decision making (see chart for details).    MDM Rules/Calculators/A&P                         87 year old male presenting to ER with concern for leg swelling, weight gain, shortness of breath, new onset A. fib.  On evaluation, patient noted to have mild tachypnea, bilateral leg swelling.  CXR concerning for small effusions and fluid in the fissures.  Suspect heart failure.  Regarding A. fib, patient is currently at a reasonable rate.  Will defer decision  regarding anticoagulation to admitting hospitalist.  Will initiate IV diuresis, consult hospitalist for admission for new onset heart failure, afib.  Final Clinical Impression(s) / ED Diagnoses Final diagnoses:  Heart failure, unspecified HF chronicity, unspecified heart failure type (HCC)  Hypervolemia, unspecified hypervolemia type  Atrial fibrillation, unspecified type [facility])    Rx / DC Orders ED Discharge Orders    None       Milagros Loll, MD 06/19/20 1846

## 2020-06-19 NOTE — Assessment & Plan Note (Signed)
Concern that he has flash pulmonary edema with poor lung sounds and new 20 pound weight gain. Asked to go to ER today for treatment and evaluation.

## 2020-06-19 NOTE — ED Triage Notes (Signed)
Patient's daughter reports that the patient has been having SOB since yesterday and swelling in bilateral lower extremities x 1 week. Patient went to his PCP today and had a new onset of atrial fib.

## 2020-06-20 ENCOUNTER — Observation Stay (HOSPITAL_COMMUNITY): Payer: Medicare Other

## 2020-06-20 DIAGNOSIS — M545 Low back pain, unspecified: Secondary | ICD-10-CM | POA: Diagnosis present

## 2020-06-20 DIAGNOSIS — I361 Nonrheumatic tricuspid (valve) insufficiency: Secondary | ICD-10-CM

## 2020-06-20 DIAGNOSIS — I4891 Unspecified atrial fibrillation: Secondary | ICD-10-CM | POA: Diagnosis not present

## 2020-06-20 DIAGNOSIS — R451 Restlessness and agitation: Secondary | ICD-10-CM | POA: Diagnosis present

## 2020-06-20 DIAGNOSIS — R5381 Other malaise: Secondary | ICD-10-CM | POA: Diagnosis present

## 2020-06-20 DIAGNOSIS — G8929 Other chronic pain: Secondary | ICD-10-CM | POA: Diagnosis present

## 2020-06-20 DIAGNOSIS — Z79899 Other long term (current) drug therapy: Secondary | ICD-10-CM | POA: Diagnosis not present

## 2020-06-20 DIAGNOSIS — Z89422 Acquired absence of other left toe(s): Secondary | ICD-10-CM | POA: Diagnosis not present

## 2020-06-20 DIAGNOSIS — I5031 Acute diastolic (congestive) heart failure: Secondary | ICD-10-CM | POA: Diagnosis present

## 2020-06-20 DIAGNOSIS — Z66 Do not resuscitate: Secondary | ICD-10-CM | POA: Diagnosis present

## 2020-06-20 DIAGNOSIS — R0600 Dyspnea, unspecified: Secondary | ICD-10-CM | POA: Diagnosis not present

## 2020-06-20 DIAGNOSIS — I340 Nonrheumatic mitral (valve) insufficiency: Secondary | ICD-10-CM | POA: Diagnosis not present

## 2020-06-20 DIAGNOSIS — I480 Paroxysmal atrial fibrillation: Secondary | ICD-10-CM | POA: Diagnosis present

## 2020-06-20 DIAGNOSIS — I5032 Chronic diastolic (congestive) heart failure: Secondary | ICD-10-CM

## 2020-06-20 DIAGNOSIS — Z781 Physical restraint status: Secondary | ICD-10-CM | POA: Diagnosis not present

## 2020-06-20 DIAGNOSIS — U071 COVID-19: Secondary | ICD-10-CM | POA: Diagnosis present

## 2020-06-20 DIAGNOSIS — N179 Acute kidney failure, unspecified: Secondary | ICD-10-CM | POA: Diagnosis present

## 2020-06-20 DIAGNOSIS — E876 Hypokalemia: Secondary | ICD-10-CM | POA: Diagnosis present

## 2020-06-20 DIAGNOSIS — Z8551 Personal history of malignant neoplasm of bladder: Secondary | ICD-10-CM | POA: Diagnosis not present

## 2020-06-20 DIAGNOSIS — K219 Gastro-esophageal reflux disease without esophagitis: Secondary | ICD-10-CM | POA: Diagnosis present

## 2020-06-20 DIAGNOSIS — I509 Heart failure, unspecified: Secondary | ICD-10-CM | POA: Diagnosis present

## 2020-06-20 DIAGNOSIS — E785 Hyperlipidemia, unspecified: Secondary | ICD-10-CM | POA: Diagnosis present

## 2020-06-20 LAB — BASIC METABOLIC PANEL
Anion gap: 10 (ref 5–15)
BUN: 16 mg/dL (ref 8–23)
CO2: 28 mmol/L (ref 22–32)
Calcium: 9.1 mg/dL (ref 8.9–10.3)
Chloride: 106 mmol/L (ref 98–111)
Creatinine, Ser: 1.14 mg/dL (ref 0.61–1.24)
GFR, Estimated: 58 mL/min — ABNORMAL LOW (ref >60)
Glucose, Bld: 92 mg/dL (ref 70–99)
Potassium: 3.3 mmol/L — ABNORMAL LOW (ref 3.5–5.1)
Sodium: 144 mmol/L (ref 135–145)

## 2020-06-20 LAB — CBC
HCT: 39.2 % (ref 39.0–52.0)
Hemoglobin: 12.3 g/dL — ABNORMAL LOW (ref 13.0–17.0)
MCH: 30 pg (ref 26.0–34.0)
MCHC: 31.4 g/dL (ref 30.0–36.0)
MCV: 95.6 fL (ref 80.0–100.0)
Platelets: 163 10*3/uL (ref 150–400)
RBC: 4.1 MIL/uL — ABNORMAL LOW (ref 4.22–5.81)
RDW: 16.1 % — ABNORMAL HIGH (ref 11.5–15.5)
WBC: 7.2 10*3/uL (ref 4.0–10.5)
nRBC: 0 % (ref 0.0–0.2)

## 2020-06-20 LAB — ECHOCARDIOGRAM LIMITED
Area-P 1/2: 3.77 cm2
Height: 71 in
S' Lateral: 3.4 cm
Weight: 2874.8 oz

## 2020-06-20 LAB — SARS CORONAVIRUS 2 (TAT 6-24 HRS): SARS Coronavirus 2: POSITIVE — AB

## 2020-06-20 MED ORDER — POTASSIUM CHLORIDE CRYS ER 20 MEQ PO TBCR
40.0000 meq | EXTENDED_RELEASE_TABLET | Freq: Once | ORAL | Status: AC
  Administered 2020-06-20: 40 meq via ORAL
  Filled 2020-06-20: qty 2

## 2020-06-20 MED ORDER — SODIUM CHLORIDE 0.9 % IV SOLN
100.0000 mg | Freq: Every day | INTRAVENOUS | Status: AC
  Administered 2020-06-21 – 2020-06-22 (×2): 100 mg via INTRAVENOUS
  Filled 2020-06-20 (×2): qty 20

## 2020-06-20 MED ORDER — SODIUM CHLORIDE 0.9 % IV SOLN
200.0000 mg | Freq: Once | INTRAVENOUS | Status: AC
  Administered 2020-06-20: 200 mg via INTRAVENOUS
  Filled 2020-06-20: qty 40

## 2020-06-20 NOTE — Plan of Care (Signed)
Patient confused throughout shift, daughter states mentation varies at home.  Up to chair with 2 max assist, continues to diurese.  Maintains oxygen saturation 95 to100% on room air.  Patient with poor appetite, ate 1/2 of a grilled cheese sandwich and drank 2 juices and one coke as entire intake for 7 a to 7 p shift.

## 2020-06-20 NOTE — Plan of Care (Signed)
Problem: Education: Goal: Knowledge of General Education information will improve Description: Including pain rating scale, medication(s)/side effects and non-pharmacologic comfort measures 06/20/2020 2252 by Rick Duff, RN Outcome: Progressing 06/20/2020 2252 by Rick Duff, RN Outcome: Progressing   Problem: Health Behavior/Discharge Planning: Goal: Ability to manage health-related needs will improve 06/20/2020 2252 by Rick Duff, RN Outcome: Progressing 06/20/2020 2252 by Rick Duff, RN Outcome: Progressing   Problem: Clinical Measurements: Goal: Ability to maintain clinical measurements within normal limits will improve 06/20/2020 2252 by Rick Duff, RN Outcome: Progressing 06/20/2020 2252 by Rick Duff, RN Outcome: Progressing Goal: Will remain free from infection 06/20/2020 2252 by Rick Duff, RN Outcome: Progressing 06/20/2020 2252 by Rick Duff, RN Outcome: Progressing Goal: Diagnostic test results will improve 06/20/2020 2252 by Rick Duff, RN Outcome: Progressing 06/20/2020 2252 by Rick Duff, RN Outcome: Progressing Goal: Respiratory complications will improve 06/20/2020 2252 by Rick Duff, RN Outcome: Progressing 06/20/2020 2252 by Rick Duff, RN Outcome: Progressing Goal: Cardiovascular complication will be avoided 06/20/2020 2252 by Rick Duff, RN Outcome: Progressing 06/20/2020 2252 by Rick Duff, RN Outcome: Progressing   Problem: Activity: Goal: Risk for activity intolerance will decrease 06/20/2020 2252 by Rick Duff, RN Outcome: Progressing 06/20/2020 2252 by Rick Duff, RN Outcome: Progressing   Problem: Nutrition: Goal: Adequate nutrition will be maintained 06/20/2020 2252 by Rick Duff, RN Outcome: Progressing 06/20/2020 2252 by Rick Duff, RN Outcome: Progressing   Problem: Coping: Goal: Level of anxiety will decrease 06/20/2020 2252 by Rick Duff, RN Outcome:  Progressing 06/20/2020 2252 by Rick Duff, RN Outcome: Progressing   Problem: Elimination: Goal: Will not experience complications related to bowel motility 06/20/2020 2252 by Rick Duff, RN Outcome: Progressing 06/20/2020 2252 by Rick Duff, RN Outcome: Progressing Goal: Will not experience complications related to urinary retention 06/20/2020 2252 by Rick Duff, RN Outcome: Progressing 06/20/2020 2252 by Rick Duff, RN Outcome: Progressing   Problem: Pain Managment: Goal: General experience of comfort will improve 06/20/2020 2252 by Rick Duff, RN Outcome: Progressing 06/20/2020 2252 by Rick Duff, RN Outcome: Progressing   Problem: Safety: Goal: Ability to remain free from injury will improve 06/20/2020 2252 by Rick Duff, RN Outcome: Progressing 06/20/2020 2252 by Rick Duff, RN Outcome: Progressing   Problem: Skin Integrity: Goal: Risk for impaired skin integrity will decrease 06/20/2020 2252 by Rick Duff, RN Outcome: Progressing 06/20/2020 2252 by Rick Duff, RN Outcome: Progressing   Problem: Education: Goal: Knowledge of disease or condition will improve 06/20/2020 2252 by Rick Duff, RN Outcome: Progressing 06/20/2020 2252 by Rick Duff, RN Outcome: Progressing Goal: Understanding of medication regimen will improve 06/20/2020 2252 by Rick Duff, RN Outcome: Progressing 06/20/2020 2252 by Rick Duff, RN Outcome: Progressing Goal: Individualized Educational Video(s) 06/20/2020 2252 by Rick Duff, RN Outcome: Progressing 06/20/2020 2252 by Rick Duff, RN Outcome: Progressing   Problem: Activity: Goal: Ability to tolerate increased activity will improve 06/20/2020 2252 by Rick Duff, RN Outcome: Progressing 06/20/2020 2252 by Rick Duff, RN Outcome: Progressing   Problem: Cardiac: Goal: Ability to achieve and maintain adequate cardiopulmonary perfusion will  improve 06/20/2020 2252 by Rick Duff, RN Outcome: Progressing 06/20/2020 2252 by Rick Duff, RN Outcome: Progressing   Problem: Health Behavior/Discharge Planning: Goal: Ability to safely manage health-related needs after discharge will improve 06/20/2020 2252 by Rick Duff,  RN Outcome: Progressing 06/20/2020 2252 by Rick Duff, RN Outcome: Progressing   Problem: Safety: Goal: Non-violent Restraint(s) 06/20/2020 2252 by Rick Duff, RN Outcome: Progressing 06/20/2020 2252 by Rick Duff, RN Outcome: Progressing   Problem: Education: Goal: Knowledge of risk factors and measures for prevention of condition will improve Outcome: Progressing   Problem: Coping: Goal: Psychosocial and spiritual needs will be supported Outcome: Progressing   Problem: Respiratory: Goal: Will maintain a patent airway Outcome: Progressing Goal: Complications related to the disease process, condition or treatment will be avoided or minimized Outcome: Progressing

## 2020-06-20 NOTE — Progress Notes (Signed)
Pt is Covid positive. CN,Dr. Opyd and pt daughter made aware.  Pt transferred to room 1443 via bed. Pt in NAD. Report given to [facility]

## 2020-06-20 NOTE — Progress Notes (Signed)
Patient Eliquis dose changed to 5 mg twice daily as he only met 1 out of 3 criteria for 2.5 mg.

## 2020-06-20 NOTE — Discharge Instructions (Signed)

## 2020-06-20 NOTE — Progress Notes (Signed)
  Echocardiogram 2D Echocardiogram has been performed.  Cory Hansen 06/20/2020, 3:01 PM

## 2020-06-20 NOTE — Progress Notes (Signed)
PROGRESS NOTE    Cory Hansen  FWY:[id number] DOB: Oct 31, 1920 DOA: 06/19/2020 PCP: [name], MD   Chief Complaint  Patient presents with  . new onset atrial fib  . Leg Swelling    Brief Narrative:  87 year old gentleman prior history of chronic low back pain, hyperlipidemia, history of bladder cancer, presents from home with shortness of breath lower extremity edema for 1 to 2 weeks.  No history of fevers or chills.  Chest x-ray showed small bilateral pleural effusions.  BNP elevated at 472.  Covid 19 is positive.  He was admitted for duration of new onset CHF.   Assessment & Plan:   Principal Problem:   Acute CHF (HCC) Active Problems:   AKI (acute kidney injury) (HCC)   New onset atrial fibrillation (HCC)   Elevated BNP, shortness of breath 20 pound weight gain and lower extremity edema and chest x-ray showing small bilateral pleural effusions Possibly CHF. Echocardiogram ordered, started the patient on 40 mg of Lasix IV twice daily and he has diuresed about 4 L since admission.  Continue with strict intake and output and daily weights.  Monitor patient on telemetry.   New onset atrial fibrillation Patient was found to have A. fib by evaluation by PCP today, unclear how long this has been going on. TSH within normal limits, as needed metoprolol for rate control. Started the patient on low-dose Eliquis 25 mg twice daily, Rate controlled at this time. Echocardiogram ordered for further evaluation. Cardiology will be consulted for further evaluation.    Mild AKI probably secondary to CHF Creatinine has improved to 1 from 1.3 Continue diuresis and monitor renal parameters    Hypokalemia Replaced.   Covid infection Patient was started on remdesivir x3 doses Patient is on room air with saturations 100%   Acute encephalopathy unclear if its new vs ? Does pt have baseline dementia  Vs hospital acquired delirious  He appears to be confused , delirious.   Daughter would like to pursue SNF. PT evaluation ordered.   As per the daughter patient has difficulty swallowing, we will get SLP evaluation.     DVT prophylaxis: Eliquis  code Status: DNR Family Communication: Discussed with [name] on the phone and answered all her questions  disposition:   Status is: Observation  The patient will require care spanning > 2 midnights and should be moved to inpatient because: Ongoing diagnostic testing needed not appropriate for outpatient work up, IV treatments appropriate due to intensity of illness or inability to take PO and Inpatient level of care appropriate due to severity of illness  Dispo: The patient is from: Home              Anticipated d/c is to: SNF              Patient currently is not medically stable to d/c.   Difficult to place patient No       Consultants:   None  Procedures: None Antimicrobials: None  Subjective: .  Confused no distress noted  Objective: Vitals:   06/20/20 0500 06/20/20 0620 06/20/20 0817 06/20/20 0818  BP:  (!) 144/80  (!) 145/92  Pulse:  (!) 101  (!) 106  Resp:  20  20  Temp:  98.4 F (36.9 C)  98.4 F (36.9 C)  TempSrc:  Oral  Oral  SpO2:  93% 98% 100%  Weight: 81.5 kg     Height:        Intake/Output Summary (Last 24 hours)  at 06/20/2020 1306 Last data filed at 06/20/2020 0948 Gross per 24 hour  Intake 290 ml  Output 5000 ml  Net -4710 ml   Filed Weights   06/19/20 2110 06/20/20 0500  Weight: 86.6 kg 81.5 kg    Examination:  General exam: Elderly gentleman appears to be comfortable no distress noted, on room air at this time Respiratory system: No tachypnea, diminished air entry at bases, no wheezing heard Cardiovascular system: S1 & S2 heard, Irregularly irregular,  Gastrointestinal system: Abdomen is nondistended, soft and nontender. Normal bowel sounds heard. Central nervous system: Wakes up to verbal cues, following simple commands, not oriented Extremities: improving  edema or cyanosis Skin: No rashes, Psychiatry: Pleasantly confused    Data Reviewed: I have personally reviewed following labs and imaging studies  CBC: Recent Labs  Lab 06/19/20 1715 06/20/20 0803  WBC 6.1 7.2  NEUTROABS 3.9  --   HGB 12.0* 12.3*  HCT 38.9* 39.2  MCV 98.5 95.6  PLT 164 163    Basic Metabolic Panel: Recent Labs  Lab 06/19/20 1715 06/19/20 1908 06/20/20 0803  NA 145  --  144  K 4.2  --  3.3*  CL 110  --  106  CO2 22  --  28  GLUCOSE 95  --  92  BUN 21  --  16  CREATININE 1.32*  --  1.14  CALCIUM 9.4  --  9.1  MG  --  2.1  --     GFR: Estimated Creatinine Clearance: 39.4 mL/min (by C-G formula based on SCr of 1.14 mg/dL).  Liver Function Tests: No results for input(s): AST, ALT, ALKPHOS, BILITOT, PROT, ALBUMIN in the last 168 hours.  CBG: Recent Labs  Lab 06/19/20 2320  GLUCAP 106*     Recent Results (from the past 240 hour(s))  SARS CORONAVIRUS 2 (TAT 6-24 HRS) Nasopharyngeal Nasopharyngeal Swab     Status: Abnormal   Collection Time: 06/19/20  7:08 PM   Specimen: Nasopharyngeal Swab  Result Value Ref Range Status   SARS Coronavirus 2 POSITIVE (A) NEGATIVE Final    Comment: (NOTE) SARS-CoV-2 target nucleic acids are DETECTED.  The SARS-CoV-2 RNA is generally detectable in upper and lower respiratory specimens during the acute phase of infection. Positive results are indicative of the presence of SARS-CoV-2 RNA. Clinical correlation with patient history and other diagnostic information is  necessary to determine patient infection status. Positive results do not rule out bacterial infection or co-infection with other viruses.  The expected result is Negative.  Fact Sheet for Patients: HairSlick.no  Fact Sheet for Healthcare Providers: quierodirigir.com  This test is not yet approved or cleared by the [place] FDA and  has been authorized for detection and/or diagnosis of  SARS-CoV-2 by FDA under an Emergency Use Authorization (EUA). This EUA will remain  in effect (meaning this test can be used) for the duration of the COVID-19 declaration under Section 564(b)(1) of the Act, 21 U. S.C. section 360bbb-3(b)(1), unless the authorization is terminated or revoked sooner.   Performed at [facility], [address], [place], [place] [address]          Radiology Studies: DG Chest 2 View  Result Date: 06/19/2020 CLINICAL DATA:  Shortness of breath. Bilateral lower extremity swelling. No onset atrial fibrillation. EXAM: CHEST - 2 VIEW COMPARISON:  Radiograph 08/28/2009 FINDINGS: Upper normal heart size. Normal mediastinal contours with aortic atherosclerosis. There are bilateral calcified pleural plaques, also seen on prior exam. Small pleural effusions with  fluid in the fissures. No pulmonary edema. No pneumothorax. No acute osseous abnormalities are seen. IMPRESSION: 1. Small pleural effusions with fluid in the fissures. 2. Upper normal heart size.  Aortic Atherosclerosis (ICD10-I70.0). 3. Chronic bilateral calcified pleural plaques. Electronically Signed   By: Narda Rutherford M.D.   On: 06/19/2020 16:56        Scheduled Meds: . apixaban  5 mg Oral BID  . furosemide  40 mg Intravenous BID  . multivitamin with minerals  1 tablet Oral Daily  . pantoprazole  40 mg Oral Daily  . sodium chloride flush  3 mL Intravenous Q12H   Continuous Infusions: . [START ON 06/21/2020] remdesivir 100 mg in NS 100 mL       LOS: 0 days       Kathlen Mody, MD [facility]   To contact the attending provider between 7A-7P or the covering provider during after hours 7P-7A, please log into the web site [email or web address] and access using universal [place] password for that web site. If you do not have the password, please call the hospital operator.  06/20/2020, 1:06 PM

## 2020-06-20 NOTE — Plan of Care (Signed)
  Problem: Education: Goal: Knowledge of General Education information will improve Description: Including pain rating scale, medication(s)/side effects and non-pharmacologic comfort measures Outcome: Progressing   Problem: Health Behavior/Discharge Planning: Goal: Ability to manage health-related needs will improve Outcome: Progressing   Problem: Clinical Measurements: Goal: Ability to maintain clinical measurements within normal limits will improve Outcome: Progressing Goal: Will remain free from infection Outcome: Progressing Goal: Diagnostic test results will improve Outcome: Progressing Goal: Respiratory complications will improve Outcome: Progressing Goal: Cardiovascular complication will be avoided Outcome: Progressing   Problem: Activity: Goal: Risk for activity intolerance will decrease Outcome: Progressing   Problem: Nutrition: Goal: Adequate nutrition will be maintained Outcome: Progressing   Problem: Coping: Goal: Level of anxiety will decrease Outcome: Progressing   Problem: Elimination: Goal: Will not experience complications related to bowel motility Outcome: Progressing Goal: Will not experience complications related to urinary retention Outcome: Progressing   Problem: Pain Managment: Goal: General experience of comfort will improve Outcome: Progressing   Problem: Safety: Goal: Ability to remain free from injury will improve Outcome: Progressing   Problem: Skin Integrity: Goal: Risk for impaired skin integrity will decrease Outcome: Progressing   Problem: Education: Goal: Knowledge of disease or condition will improve Outcome: Progressing Goal: Understanding of medication regimen will improve Outcome: Progressing Goal: Individualized Educational Video(s) Outcome: Progressing   Problem: Activity: Goal: Ability to tolerate increased activity will improve Outcome: Progressing   Problem: Cardiac: Goal: Ability to achieve and maintain  adequate cardiopulmonary perfusion will improve Outcome: Progressing   Problem: Health Behavior/Discharge Planning: Goal: Ability to safely manage health-related needs after discharge will improve Outcome: Progressing   Problem: Safety: Goal: Non-violent Restraint(s) Outcome: Progressing   

## 2020-06-21 DIAGNOSIS — I5031 Acute diastolic (congestive) heart failure: Secondary | ICD-10-CM

## 2020-06-21 MED ORDER — METOPROLOL TARTRATE 25 MG PO TABS
12.5000 mg | ORAL_TABLET | Freq: Two times a day (BID) | ORAL | Status: DC
  Administered 2020-06-21 – 2020-06-29 (×16): 12.5 mg via ORAL
  Filled 2020-06-21 (×16): qty 1

## 2020-06-21 MED ORDER — FUROSEMIDE 40 MG PO TABS
40.0000 mg | ORAL_TABLET | Freq: Two times a day (BID) | ORAL | Status: DC
  Administered 2020-06-21: 40 mg via ORAL
  Filled 2020-06-21: qty 1

## 2020-06-21 NOTE — Evaluation (Signed)
Clinical/Bedside Swallow Evaluation Patient Details  Name: Cory Hansen MRN: [id number] Date of Birth: Jul 26, 1920  Today's Date: 06/21/2020 Time: SLP Start Time (ACUTE ONLY): 1705 SLP Stop Time (ACUTE ONLY): 1735 SLP Time Calculation (min) (ACUTE ONLY): 30 min  Past Medical History:  Past Medical History:  Diagnosis Date  . Arthritis   . Atrial fibrillation (HCC)   . Kidney stones   . Shingles    Past Surgical History:  Past Surgical History:  Procedure Laterality Date  . AMPUTATION Left 02/22/2014   Procedure: INCISION AND DRAINAGE LEFT FOREFOOT AMPUTATION FIRST RAY;  Surgeon: Toni Arthurs, MD;  Location: [facility] OR;  Service: Orthopedics;  Laterality: Left;  . APPENDECTOMY    . EYE SURGERY Bilateral    cataract surgery w/ lens implant  . HERNIA REPAIR     inguinal hernia repair   HPI:  87 yo male adm to [facility] confused with poor po, imaging showed pleural effusions and found to have incidental COVID.  Swallow evaluation ordered.  Pt denies dysphagia and reports he takes his medications with liquids.  RN denies pt having dysphagia and taking pills with liquids well.   Assessment / Plan / Recommendation Clinical Impression  Patient presents with functional oropharyngeal swallow ability based on clinical swallow evaluation  He is able to feed himself despite having arthritis.  Finger food are easier for him to manage.  Subtle cough x1 only with thin liquid however no further episodes noted.  Pt denies dysphagia and has no significant neuro disorder.  He does admit the does not remove his dentures nightly and SlP advised he should remove and clean nightly - as well as brushing his gums/dentition.  All education completed with pt - no follow up needed.  If pt had transient dysphagia, suspect it was due to AMS, which appears to have now resolved. Of note, pt does report recent issue with potential infection of his uvula approx 3 weeks ago.  Upon observation of anterior uvula, it appeared pink  and healthy. SLP Visit Diagnosis: Dysphagia, unspecified (R13.10)    Aspiration Risk  Mild aspiration risk    Diet Recommendation     Liquid Administration via: Cup;Straw Medication Administration: Whole meds with liquid Supervision: Patient able to self feed Compensations: Minimize environmental distractions;Slow rate Postural Changes: Seated upright at 90 degrees;Remain upright for at least 30 minutes after po intake    Other  Recommendations   n/a - dentures out nightly and cleaned, brush gums/tongue, dentition   Follow up Recommendations   none     Frequency and Duration     none       Prognosis   n/a     Swallow Study   General Date of Onset: 06/21/20 HPI: 87 yo male adm to [facility] confused with poor po, imaging showed pleural effusions and found to have incidental COVID.  Swallow evaluation ordered.  Pt denies dysphagia and reports he takes his medications with liquids.  RN denies pt having dysphagia and taking pills with liquids well. Type of Study: Bedside Swallow Evaluation Diet Prior to this Study: Regular;Thin liquids Temperature Spikes Noted: No Respiratory Status: Room air Behavior/Cognition: Alert Oral Cavity Assessment: Within Functional Limits Oral Care Completed by SLP: No Oral Cavity - Dentition: Dentures, bottom Vision: Functional for self-feeding Self-Feeding Abilities: Able to feed self Patient Positioning: Upright in bed Baseline Vocal Quality: Normal Volitional Cough: Strong Volitional Swallow: Able to elicit    Oral/Motor/Sensory Function Overall Oral Motor/Sensory Function: Within functional limits   Ice Chips  Ice chips: Not tested   Thin Liquid Thin Liquid: Within functional limits Presentation: Straw    Nectar Thick Nectar Thick Liquid: Not tested   Honey Thick Honey Thick Liquid: Not tested   Puree Puree: Within functional limits   Solid     Solid: Within functional limits      Cory Hansen 06/21/2020,6:15 PM  Cory Infante, MS  [facility] SLP Acute Rehab Services Office [phone] Pager [phone]

## 2020-06-21 NOTE — Progress Notes (Signed)
PROGRESS NOTE    IKTAN AIKMAN  YQI:[id number] DOB: [birth year] DOA: 06/19/2020 PCP: [name], MD    Brief Narrative:  SLAYTON LUBITZ is a 87 year old male with past medical history significant for bladder cancer, hyperlipidemia, low back pain, who presented to the ED with progressive shortness of breath and lower extremity edema.  Reports weight gain of about 20 pounds in the last few weeks.  Lower extremity edema present now 1-2 weeks.  Patient saw his PCP who found patient in atrial fibrillation, which is new and he was sent to the ED for further evaluation.  In the ED, sodium 145, potassium 4.2, chloride 110, CO2 22, glucose 95, BUN 21, creatinine 1.32.  BNP elevated 472.5.  Troponin 10>9.  WBC 6.1, hemoglobin 12.0, platelets 164.  Covid-19 test positive.  Chest x-ray with small pleural effusions, upper normal heart size.  EKG with A. fib, rate 100, no concerning ST elevation/depressions or T wave inversions.  Hospital service consulted for further evaluation and management of dyspnea and new onset atrial fibrillation.   Assessment & Plan:   Principal Problem:   Acute CHF (HCC) Active Problems:   AKI (acute kidney injury) (HCC)   New onset atrial fibrillation (HCC)   CHF (congestive heart failure) (HCC)   Paroxysmal atrial fibrillation Patient presenting to ED with shortness of breath, found to be in atrial fibrillation.  No previous history.  TSH within normal limits.  TTE with LVEF 55-60%, no LV wall motion abnormalities, normal IVC. --Start metoprolol tartrate 12.5mg  PO BID --Started Eliquis 5 mg p.o. twice daily --Continue to monitor on telemetry  Acute diastolic congestive heart failure Patient presented with progressive shortness of breath, lower extremity edema.  Found to have vascular congestion, pleural effusions on chest x-ray with elevated BNP.  TTE with LVEF 55-60% with no regional wall motion abnormalities. --net negative 3.4L past 24h and net negative  7.2L since admission --wt 86.6>81.9kg --Change furosemide to 40 mg p.o. twice daily --Continue beta-blocker as above --Strict I's and O's and daily weights --Repeat BMP in the a.m.  Covid-19 viral infection Covid-19 test positive on admission.  Vaccinated with Pfizer vaccine on 5/6 and 09/02/2019.  Oxygenating well on room air. --Continue remdesivir, plan 3-day course  GERD: Continue PPI  Weakness/deconditioning/debility: --PT/OT evaluation   DVT prophylaxis: Eliquis   Code Status: DNR Family Communication: Updated patient's daughter this morning  Disposition Plan:  Level of care: Telemetry Status is: Inpatient  Remains inpatient appropriate because:Ongoing diagnostic testing needed not appropriate for outpatient work up, Unsafe d/c plan, IV treatments appropriate due to intensity of illness or inability to take PO and Inpatient level of care appropriate due to severity of illness   Dispo: The patient is from: Home              Anticipated d/c is to: SNF              Patient currently is not medically stable to d/c.   Difficult to place patient No   Consultants:   None  Procedures:   TTE  Antimicrobials:   None   Subjective: Patient seen and examined at bedside, resting comfortably.  No specific complaints.  Updated daughter was present in hallway.  Shortness of breath now resolved.  Awaiting PT/OT evaluation.  Denies headache, no fever/chills/night sweats, no nausea/vomiting/diarrhea, no chest pain, palpitations, no shortness of breath, no abdominal pain.  No acute events overnight per nursing staff.  Objective: Vitals:   06/20/20 0818 06/20/20 1349 06/20/20  2035 06/21/20 0424  BP: (!) 145/92 128/82 (!) 130/99 118/89  Pulse: (!) 106 (!) 102 100 98  Resp: 20 20 18 20   Temp: 98.4 F (36.9 C) 97.6 F (36.4 C) 98 F (36.7 C) 98 F (36.7 C)  TempSrc: Oral Axillary Axillary Oral  SpO2: 100% 99% 100% 100%  Weight:    81.9 kg  Height:        Intake/Output  Summary (Last 24 hours) at 06/21/2020 1440 Last data filed at 06/21/2020 0544 Gross per 24 hour  Intake 40 ml  Output 1950 ml  Net -1910 ml   Filed Weights   06/19/20 2110 06/20/20 0500 06/21/20 0424  Weight: 86.6 kg 81.5 kg 81.9 kg    Examination:  General exam: Appears calm and comfortable, elderly in appearance Respiratory system: Clear to auscultation. Respiratory effort normal.  Oxygenating well on room air Cardiovascular system: S1 & S2 heard, RRR. No JVD, murmurs, rubs, gallops or clicks. No pedal edema. Gastrointestinal system: Abdomen is nondistended, soft and nontender. No organomegaly or masses felt. Normal bowel sounds heard. Central nervous system: Alert and oriented. No focal neurological deficits. Extremities: Symmetric 5 x 5 power. Skin: No rashes, lesions or ulcers Psychiatry: Judgement and insight appear normal. Mood & affect appropriate.     Data Reviewed: I have personally reviewed following labs and imaging studies  CBC: Recent Labs  Lab 06/19/20 1715 06/20/20 0803  WBC 6.1 7.2  NEUTROABS 3.9  --   HGB 12.0* 12.3*  HCT 38.9* 39.2  MCV 98.5 95.6  PLT 164 163   Basic Metabolic Panel: Recent Labs  Lab 06/19/20 1715 06/19/20 1908 06/20/20 0803  NA 145  --  144  K 4.2  --  3.3*  CL 110  --  106  CO2 22  --  28  GLUCOSE 95  --  92  BUN 21  --  16  CREATININE 1.32*  --  1.14  CALCIUM 9.4  --  9.1  MG  --  2.1  --    GFR: Estimated Creatinine Clearance: 39.4 mL/min (by C-G formula based on SCr of 1.14 mg/dL). Liver Function Tests: No results for input(s): AST, ALT, ALKPHOS, BILITOT, PROT, ALBUMIN in the last 168 hours. No results for input(s): LIPASE, AMYLASE in the last 168 hours. No results for input(s): AMMONIA in the last 168 hours. Coagulation Profile: Recent Labs  Lab 06/19/20 1715  INR 1.1   Cardiac Enzymes: No results for input(s): CKTOTAL, CKMB, CKMBINDEX, TROPONINI in the last 168 hours. BNP (last 3 results) No results for  input(s): PROBNP in the last 8760 hours. HbA1C: No results for input(s): HGBA1C in the last 72 hours. CBG: Recent Labs  Lab 06/19/20 2320  GLUCAP 106*   Lipid Profile: No results for input(s): CHOL, HDL, LDLCALC, TRIG, CHOLHDL, LDLDIRECT in the last 72 hours. Thyroid Function Tests: Recent Labs    06/19/20 1926  TSH 2.908   Anemia Panel: No results for input(s): VITAMINB12, FOLATE, FERRITIN, TIBC, IRON, RETICCTPCT in the last 72 hours. Sepsis Labs: No results for input(s): PROCALCITON, LATICACIDVEN in the last 168 hours.  Recent Results (from the past 240 hour(s))  SARS CORONAVIRUS 2 (TAT 6-24 HRS) Nasopharyngeal Nasopharyngeal Swab     Status: Abnormal   Collection Time: 06/19/20  7:08 PM   Specimen: Nasopharyngeal Swab  Result Value Ref Range Status   SARS Coronavirus 2 POSITIVE (A) NEGATIVE Final    Comment: (NOTE) SARS-CoV-2 target nucleic acids are DETECTED.  The SARS-CoV-2 RNA is generally  detectable in upper and lower respiratory specimens during the acute phase of infection. Positive results are indicative of the presence of SARS-CoV-2 RNA. Clinical correlation with patient history and other diagnostic information is  necessary to determine patient infection status. Positive results do not rule out bacterial infection or co-infection with other viruses.  The expected result is Negative.  Fact Sheet for Patients: HairSlick.no  Fact Sheet for Healthcare Providers: quierodirigir.com  This test is not yet approved or cleared by the [place] FDA and  has been authorized for detection and/or diagnosis of SARS-CoV-2 by FDA under an Emergency Use Authorization (EUA). This EUA will remain  in effect (meaning this test can be used) for the duration of the COVID-19 declaration under Section 564(b)(1) of the Act, 21 U. S.C. section 360bbb-3(b)(1), unless the authorization is terminated or revoked  sooner.   Performed at [facility], [address], [place], [place] [address]          Radiology Studies: DG Chest 2 View  Result Date: 06/19/2020 CLINICAL DATA:  Shortness of breath. Bilateral lower extremity swelling. No onset atrial fibrillation. EXAM: CHEST - 2 VIEW COMPARISON:  Radiograph 08/28/2009 FINDINGS: Upper normal heart size. Normal mediastinal contours with aortic atherosclerosis. There are bilateral calcified pleural plaques, also seen on prior exam. Small pleural effusions with fluid in the fissures. No pulmonary edema. No pneumothorax. No acute osseous abnormalities are seen. IMPRESSION: 1. Small pleural effusions with fluid in the fissures. 2. Upper normal heart size.  Aortic Atherosclerosis (ICD10-I70.0). 3. Chronic bilateral calcified pleural plaques. Electronically Signed   By: Narda Rutherford M.D.   On: 06/19/2020 16:56   ECHOCARDIOGRAM LIMITED  Result Date: 06/20/2020    ECHOCARDIOGRAM LIMITED REPORT   Patient Name:   RAMONDO DIETZE Date of Exam: 06/20/2020 Medical Rec #:  [id number]      Height:       71.0 in Accession #:    [id number]     Weight:       179.7 lb Date of Birth:  March 21, 1921     BSA:          2.015 m Patient Age:    89 years       BP:           128/82 mmHg Patient Gender: M              HR:           102 bpm. Exam Location:  Inpatient Procedure: Limited Echo, Cardiac Doppler and Color Doppler Indications:    Dyspnea  History:        Patient has no prior history of Echocardiogram examinations.                 Arrythmias:Atrial Fibrillation, Signs/Symptoms:Dyspnea, LE edema                 and Altered Mental Status; Risk Factors:Dyslipidemia. COVID+.  Sonographer:    Lavenia Atlas Referring Phys: [id number] Cecille Po MELVIN  Sonographer Comments: Suboptimal apical window. COVID+ IMPRESSIONS  1. Left ventricular ejection fraction, by estimation, is 55 to 60%. The left ventricle has normal function. The left ventricle has no regional wall motion  abnormalities. Left ventricular diastolic function could not be evaluated.  2. Right ventricular systolic function is normal. The right ventricular size is normal. There is mildly elevated pulmonary artery systolic pressure. The estimated right ventricular systolic pressure is 44.7 mmHg.  3. Left atrial size was mildly dilated.  4. The  mitral valve is normal in structure. Mild to moderate mitral valve regurgitation. No evidence of mitral stenosis.  5. Tricuspid valve regurgitation is moderate.  6. The aortic valve is normal in structure. Aortic valve regurgitation is mild. Mild aortic valve sclerosis is present, with no evidence of aortic valve stenosis.  7. The inferior vena cava is normal in size with greater than 50% respiratory variability, suggesting right atrial pressure of 3 mmHg. FINDINGS  Left Ventricle: Left ventricular ejection fraction, by estimation, is 55 to 60%. The left ventricle has normal function. The left ventricle has no regional wall motion abnormalities. The left ventricular internal cavity size was normal in size. There is  no left ventricular hypertrophy. Left ventricular diastolic function could not be evaluated. Left ventricular diastolic function could not be evaluated due to atrial fibrillation. Right Ventricle: The right ventricular size is normal. No increase in right ventricular wall thickness. Right ventricular systolic function is normal. There is mildly elevated pulmonary artery systolic pressure. The tricuspid regurgitant velocity is 3.23  m/s, and with an assumed right atrial pressure of 3 mmHg, the estimated right ventricular systolic pressure is 44.7 mmHg. Left Atrium: Left atrial size was mildly dilated. Right Atrium: Right atrial size was normal in size. Pericardium: There is no evidence of pericardial effusion. Mitral Valve: The mitral valve is normal in structure. Mild mitral annular calcification. Mild to moderate mitral valve regurgitation. No evidence of mitral valve  stenosis. Tricuspid Valve: The tricuspid valve is normal in structure. Tricuspid valve regurgitation is moderate . No evidence of tricuspid stenosis. Aortic Valve: The aortic valve is normal in structure. Aortic valve regurgitation is mild. Mild aortic valve sclerosis is present, with no evidence of aortic valve stenosis. Pulmonic Valve: The pulmonic valve was normal in structure. Pulmonic valve regurgitation is mild. No evidence of pulmonic stenosis. Aorta: The aortic root is normal in size and structure. Venous: The inferior vena cava is normal in size with greater than 50% respiratory variability, suggesting right atrial pressure of 3 mmHg. IAS/Shunts: No atrial level shunt detected by color flow Doppler. LEFT VENTRICLE PLAX 2D LVIDd:         4.80 cm  Diastology LVIDs:         3.40 cm  LV e' medial:    5.44 cm/s LV PW:         1.20 cm  LV E/e' medial:  19.1 LV IVS:        1.30 cm  LV e' lateral:   7.29 cm/s LVOT diam:     2.40 cm  LV E/e' lateral: 14.3 LVOT Area:     4.52 cm  RIGHT VENTRICLE RV S prime:     11.90 cm/s LEFT ATRIUM         Index LA diam:    4.10 cm 2.04 cm/m   AORTA Ao Root diam: 3.30 cm MITRAL VALVE                TRICUSPID VALVE MV Area (PHT): 3.77 cm     TR Peak grad:   41.7 mmHg MV Decel Time: 201 msec     TR Vmax:        323.00 cm/s MV E velocity: 104.00 cm/s MV A velocity: 25.10 cm/s   SHUNTS MV E/A ratio:  4.14         Systemic Diam: 2.40 cm Rachelle Hora Croitoru MD Electronically signed by Thurmon Fair MD Signature Date/Time: 06/20/2020/3:50:25 PM    Final  Scheduled Meds: . apixaban  5 mg Oral BID  . furosemide  40 mg Intravenous BID  . multivitamin with minerals  1 tablet Oral Daily  . pantoprazole  40 mg Oral Daily  . sodium chloride flush  3 mL Intravenous Q12H   Continuous Infusions: . remdesivir 100 mg in NS 100 mL 100 mg (06/21/20 1021)     LOS: 1 day    Time spent: 38 minutes spent on chart review, discussion with nursing staff, consultants, updating family  and interview/physical exam; more than 50% of that time was spent in counseling and/or coordination of care.    Alvira Philips [place], DO [facility] Available via Epic secure chat 7am-7pm After these hours, please refer to coverage provider listed on [email or web address] 06/21/2020, 2:40 PM

## 2020-06-21 NOTE — Evaluation (Signed)
Physical Therapy Evaluation Patient Details Name: Cory Hansen MRN: [id number] DOB: 08/29/1920 Today's Date: 06/21/2020   History of Present Illness  87 yo male admitted with acute CHF, COVID(+). Hx of hearing loss, back pain, Afib, shingles, OA, L foot 1st ray amputaiton  Clinical Impression  On eval, pt required Min assist for mobility. He walked ~15 feet around the room. R knee buckled x 1 while ambulating-pt reports chronic knee issues and that he sometimes wears a brace. He tolerated activity well. He is very willing to mobilize. No family present during session. Unsure of d/c plan-will need family input. If he will have 24 hour supervision/assist, he could d/c home as long as his family feels like they can manage his care. If not, then he may need SNF placement. Will continue to follow and progress activity as tolerated.     Follow Up Recommendations Home health PT;Supervision/Assistance - 24 hour vs SNF (depending on continued progress and family input)    Equipment Recommendations  None recommended by PT    Recommendations for Other Services       Precautions / Restrictions Precautions Precautions: Fall Restrictions Weight Bearing Restrictions: No      Mobility  Bed Mobility Overal bed mobility: Needs Assistance Bed Mobility: Supine to Sit;Sit to Supine     Supine to sit: HOB elevated;Supervision Sit to supine: HOB elevated;Supervision   General bed mobility comments: Supv for safety.    Transfers Overall transfer level: Needs assistance Equipment used: Rolling walker (2 wheeled) Transfers: Sit to/from Stand Sit to Stand: Min assist;From elevated surface         General transfer comment: Pt was unable to stand from bed until it was elevated. Increased time. Cues for safety, technique, hand placement.  Ambulation/Gait Ambulation/Gait assistance: Min assist Gait Distance (Feet): 15 Feet Assistive device: Rolling walker (2 wheeled) Gait Pattern/deviations:  Step-to pattern;Step-through pattern     General Gait Details: R knee buckled x 1 after ~7 feet. After standing for a bit, pt was able to finish walk around the bed. Assist to stabilize throughout distance. Fall risk. Pt stated he sometimes wears a brace for the R knee (it was not present at time of eval).  Stairs            Wheelchair Mobility    Modified Rankin (Stroke Patients Only)       Balance Overall balance assessment: Needs assistance         Standing balance support: Bilateral upper extremity supported Standing balance-Leahy Scale: Poor                               Pertinent Vitals/Pain Pain Assessment: No/denies pain    Home Living Family/patient expects to be discharged to:: Private residence Living Arrangements: Children Available Help at Discharge: Family;Available PRN/intermittently Type of Home: House Home Access: Ramped entrance     Home Layout: One level Home Equipment: Hand held shower head;Shower seat;Grab bars - tub/shower;Walker - 2 wheels;Bedside commode;Wheelchair - manual;Hospital bed      Prior Function Level of Independence: Needs assistance   Gait / Transfers Assistance Needed: uses RW for ambulation  ADL's / Homemaking Assistance Needed: patient reports I with dressing, toileting, DTRs help him wash his back, neck, bottom of his feet. DTRs do IADLs meal prep, cleaning, laundry, grocery shopping        Hand Dominance        Extremity/Trunk Assessment   Upper Extremity  Assessment Upper Extremity Assessment: Defer to OT evaluation    Lower Extremity Assessment Lower Extremity Assessment: Generalized weakness    Cervical / Trunk Assessment Cervical / Trunk Assessment: Kyphotic  Communication   Communication: HOH  Cognition Arousal/Alertness: Awake/alert Behavior During Therapy: WFL for tasks assessed/performed Overall Cognitive Status: History of cognitive impairments - at baseline                                  General Comments: per chart, has periods of impaired mentation. Mostly WFL during PT session today      General Comments      Exercises     Assessment/Plan    PT Assessment Patient needs continued PT services  PT Problem List Decreased strength;Decreased mobility;Decreased balance;Decreased activity tolerance       PT Treatment Interventions DME instruction;Gait training;Therapeutic activities;Therapeutic exercise;Patient/family education;Balance training;Functional mobility training    PT Goals (Current goals can be found in the Care Plan section)  Acute Rehab PT Goals Patient Stated Goal: home PT Goal Formulation: With patient Time For Goal Achievement: 07/05/20 Potential to Achieve Goals: Good    Frequency Min 3X/week   Barriers to discharge        Co-evaluation               AM-PAC PT "6 Clicks" Mobility  Outcome Measure Help needed turning from your back to your side while in a flat bed without using bedrails?: None Help needed moving from lying on your back to sitting on the side of a flat bed without using bedrails?: None Help needed moving to and from a bed to a chair (including a wheelchair)?: A Little Help needed standing up from a chair using your arms (e.g., wheelchair or bedside chair)?: A Little Help needed to walk in hospital room?: A Little Help needed climbing 3-5 steps with a railing? : A Lot 6 Click Score: 19    End of Session Equipment Utilized During Treatment: Gait belt Activity Tolerance: Patient tolerated treatment well Patient left: in bed;with call bell/phone within reach   PT Visit Diagnosis: Muscle weakness (generalized) (M62.81);Other abnormalities of gait and mobility (R26.89)    Time: 8101-7510 PT Time Calculation (min) (ACUTE ONLY): 22 min   Charges:   PT Evaluation $PT Eval Moderate Complexity: 1 Mod             Faye Ramsay, PT Acute Rehabilitation  Office: [phone] Pager:  [phone]

## 2020-06-21 NOTE — Plan of Care (Signed)
  Problem: Education: Goal: Knowledge of General Education information will improve Description: Including pain rating scale, medication(s)/side effects and non-pharmacologic comfort measures Outcome: Progressing   Problem: Health Behavior/Discharge Planning: Goal: Ability to manage health-related needs will improve Outcome: Progressing   Problem: Clinical Measurements: Goal: Ability to maintain clinical measurements within normal limits will improve Outcome: Progressing Goal: Will remain free from infection Outcome: Progressing Goal: Diagnostic test results will improve Outcome: Progressing Goal: Respiratory complications will improve Outcome: Progressing Goal: Cardiovascular complication will be avoided Outcome: Progressing   Problem: Activity: Goal: Risk for activity intolerance will decrease Outcome: Progressing   Problem: Nutrition: Goal: Adequate nutrition will be maintained Outcome: Progressing   Problem: Coping: Goal: Level of anxiety will decrease Outcome: Progressing   Problem: Elimination: Goal: Will not experience complications related to bowel motility Outcome: Progressing Goal: Will not experience complications related to urinary retention Outcome: Progressing   Problem: Pain Managment: Goal: General experience of comfort will improve Outcome: Progressing   Problem: Safety: Goal: Ability to remain free from injury will improve Outcome: Progressing   Problem: Skin Integrity: Goal: Risk for impaired skin integrity will decrease Outcome: Progressing   Problem: Education: Goal: Knowledge of disease or condition will improve Outcome: Progressing Goal: Understanding of medication regimen will improve Outcome: Progressing Goal: Individualized Educational Video(s) Outcome: Progressing   Problem: Activity: Goal: Ability to tolerate increased activity will improve Outcome: Progressing   Problem: Cardiac: Goal: Ability to achieve and maintain  adequate cardiopulmonary perfusion will improve Outcome: Progressing   Problem: Health Behavior/Discharge Planning: Goal: Ability to safely manage health-related needs after discharge will improve Outcome: Progressing   Problem: Safety: Goal: Non-violent Restraint(s) Outcome: Progressing   Problem: Education: Goal: Knowledge of risk factors and measures for prevention of condition will improve Outcome: Progressing   Problem: Coping: Goal: Psychosocial and spiritual needs will be supported Outcome: Progressing   Problem: Respiratory: Goal: Will maintain a patent airway Outcome: Progressing Goal: Complications related to the disease process, condition or treatment will be avoided or minimized Outcome: Progressing   

## 2020-06-21 NOTE — Evaluation (Signed)
SLP Cancellation Note  Patient Details Name: Cory Hansen MRN: [id number] DOB: 04-Dec-1920   Cancelled treatment:       Reason Eval/Treat Not Completed: Other (comment) (pt receiving assist from NT to use bathroom when SLP attempted visit)   [name] 06/21/2020, 2:21 PM

## 2020-06-22 LAB — BASIC METABOLIC PANEL
Anion gap: 16 — ABNORMAL HIGH (ref 5–15)
BUN: 22 mg/dL (ref 8–23)
CO2: 26 mmol/L (ref 22–32)
Calcium: 9.4 mg/dL (ref 8.9–10.3)
Chloride: 102 mmol/L (ref 98–111)
Creatinine, Ser: 1.51 mg/dL — ABNORMAL HIGH (ref 0.61–1.24)
GFR, Estimated: 42 mL/min — ABNORMAL LOW (ref >60)
Glucose, Bld: 88 mg/dL (ref 70–99)
Potassium: 3.8 mmol/L (ref 3.5–5.1)
Sodium: 144 mmol/L (ref 135–145)

## 2020-06-22 LAB — LIPID PANEL
Cholesterol: 176 mg/dL (ref 0–200)
HDL: 55 mg/dL (ref >40)
LDL Cholesterol: 116 mg/dL — ABNORMAL HIGH (ref 0–99)
Total CHOL/HDL Ratio: 3.2 RATIO
Triglycerides: 26 mg/dL (ref <150)
VLDL: 5 mg/dL (ref 0–40)

## 2020-06-22 LAB — MAGNESIUM: Magnesium: 1.9 mg/dL (ref 1.7–2.4)

## 2020-06-22 MED ORDER — FUROSEMIDE 20 MG PO TABS
20.0000 mg | ORAL_TABLET | Freq: Every day | ORAL | Status: DC
  Administered 2020-06-23: 20 mg via ORAL
  Filled 2020-06-22: qty 1

## 2020-06-22 MED ORDER — FUROSEMIDE 40 MG PO TABS
40.0000 mg | ORAL_TABLET | Freq: Every day | ORAL | Status: DC
  Administered 2020-06-22: 40 mg via ORAL
  Filled 2020-06-22: qty 1

## 2020-06-22 MED ORDER — DICLOFENAC SODIUM 1 % EX GEL
4.0000 g | Freq: Four times a day (QID) | CUTANEOUS | Status: DC
  Administered 2020-06-22 – 2020-06-29 (×26): 4 g via TOPICAL
  Filled 2020-06-22: qty 100

## 2020-06-22 NOTE — Evaluation (Signed)
Occupational Therapy Evaluation Patient Details Name: Cory Hansen MRN: [id number] DOB: 1921/02/08 Today's Date: 06/22/2020    History of Present Illness 87 yo male admitted with acute CHF, COVID(+). Hx of hearing loss, back pain, Afib, shingles, OA, L foot 1st ray amputaiton   Clinical Impression   [name] is a 87 year old man who presents with above medical history. On evaluation he is very confused and alert only to himself but able to follow most commands verbally. Patient presents with functional strength of upper extremities but complaining of difficulty with RLE - reporting history of arthritis in the knee. Patient initially min assist to stand from bed and ambulate to the bathroom with two mild posterior loss of balances. Patient min assist to transfer onto toilet - after some difficulty standing over the toilet resulting in misplacement of walker and urinating on the floor. Patient able to wash lower legs and assist with donning socks (after getting over the toes) but began demonstrating increasing difficulty with balance and endurance. Patient mod assist to rise from toilet and attempts to ambulate back into room difficult as patient began reporting difficulty using RLE. Patient seated patient on [facility] and then pulled recliner close. Patient able to walk additional 5 feet with tactile cues and assistance with walker management to safely be seated in recliner. Patient needed assistance for setting up food tray due to significant arthritic changes in bilateral hands. Therapist recommends supervision for eating as patient dropping and spilling food and once attempted to bite blanket mistaking it for food. Patient will benefit from skilled OT services while in hospital to improve deficits and learn compensatory strategies as needed in order to return to PLOF. Patient predominantly limited by confusion and if ambulation improves recommend patient return home to familiar environment with 24/7  assistance. Recommend short term rehab if patient unable to provide assistance.      Follow Up Recommendations  Home health OT;SNF    Equipment Recommendations  Other (comment) (Unsure of home equipment)    Recommendations for Other Services       Precautions / Restrictions Precautions Precautions: Fall Precaution Comments: R knee pain, arthritis Restrictions Weight Bearing Restrictions: No      Mobility Bed Mobility               General bed mobility comments: seated at edge of bed    Transfers Overall transfer level: Needs assistance Equipment used: Rolling walker (2 wheeled) Transfers: Sit to/from Stand Sit to Stand: Min assist;From elevated surface         General transfer comment: Min assist to stand from bed. Mod assist to stand from toilet (after toileting, donning socks and LB bathing and increased fatigue). Min-mod assist to maintain balance and manage walker. Patient complaining of right knee/RLE stating "I don't normally have problems with this leg." On questioning states it his knee and "it acts up sometimes"    Balance Overall balance assessment: Needs assistance Sitting-balance support: Feet supported Sitting balance-Leahy Scale: Fair     Standing balance support: Bilateral upper extremity supported Standing balance-Leahy Scale: Poor                             ADL either performed or assessed with clinical judgement   ADL Overall ADL's : Needs assistance/impaired Eating/Feeding: Set up;Supervision/ safety;Sitting Eating/Feeding Details (indicate cue type and reason): needs supervision - with attempts at grabbing dropped piece of food - picked up blanket  and bit it. Grooming: Set up;Wash/dry face;Sitting;Supervision/safety   Upper Body Bathing: Set up;Supervision/ safety;Cueing for sequencing;Sitting   Lower Body Bathing: Minimal assistance;Sitting/lateral leans;Set up;Supervison/ safety Lower Body Bathing Details (indicate cue  type and reason): able to wash lower legs, needs help with feet Upper Body Dressing : Set up;Supervision/safety;Sitting   Lower Body Dressing: Moderate assistance;Sit to/from stand Lower Body Dressing Details (indicate cue type and reason): difficulty donning socks, able to pull up after placing over toes (socks too tight), unstable and needs assistance to pull clothing up Toilet Transfer: Minimal Investment banker, corporate Details (indicate cue type and reason): min assist for tactile cues to transfer safely on toilet. Mod assist to rise. Toileting- Architect and Hygiene: Sit to/from stand;Moderate assistance Toileting - Clothing Manipulation Details (indicate cue type and reason): difficulty with balance resulting in needing assistance for clothing. Can wipe in seated position             Vision   Vision Assessment?: No apparent visual deficits     Perception     Praxis      Pertinent Vitals/Pain Pain Assessment: No/denies pain     Hand Dominance     Extremity/Trunk Assessment Upper Extremity Assessment Upper Extremity Assessment: Overall WFL for tasks assessed   Lower Extremity Assessment Lower Extremity Assessment: Defer to PT evaluation   Cervical / Trunk Assessment Cervical / Trunk Assessment: Kyphotic   Communication Communication Communication: HOH   Cognition Arousal/Alertness: Awake/alert Behavior During Therapy: WFL for tasks assessed/performed Overall Cognitive Status: No family/caregiver present to determine baseline cognitive functioning                                 General Comments: per chart, has periods of impaired mentation. Patient very confused but able to follow most commands. At times needs reiteration or tactile/verbal cues.   General Comments       Exercises     Shoulder Instructions      Home Living Family/patient expects to be discharged to:: Private residence Living  Arrangements: Children Available Help at Discharge: Family;Available PRN/intermittently Type of Home: House Home Access: Ramped entrance     Home Layout: One level     Bathroom Shower/Tub: Chief Strategy Officer: Handicapped height     Home Equipment: Hand held shower head;Shower seat;Grab bars - tub/shower;Walker - 2 wheels;Bedside commode;Wheelchair - manual;Hospital bed          Prior Functioning/Environment Level of Independence: Needs assistance  Gait / Transfers Assistance Needed: uses RW for ambulation ADL's / Homemaking Assistance Needed: patient reports I with dressing, toileting, DTRs help him wash his back, neck, bottom of his feet. DTRs do IADLs meal prep, cleaning, laundry, grocery shopping            OT Problem List: Decreased strength;Decreased range of motion;Decreased activity tolerance;Impaired balance (sitting and/or standing);Decreased cognition;Decreased safety awareness;Decreased knowledge of use of DME or AE;Pain      OT Treatment/Interventions: Self-care/ADL training;Therapeutic exercise;DME and/or AE instruction;Patient/family education;Balance training;Therapeutic activities    OT Goals(Current goals can be found in the care plan section) Acute Rehab OT Goals OT Goal Formulation: Patient unable to participate in goal setting Potential to Achieve Goals: Good  OT Frequency: Min 2X/week   Barriers to D/C:            Co-evaluation              AM-PAC OT "6 Clicks" Daily  Activity     Outcome Measure Help from another person eating meals?: A Little Help from another person taking care of personal grooming?: A Little Help from another person toileting, which includes using toliet, bedpan, or urinal?: A Lot Help from another person bathing (including washing, rinsing, drying)?: A Little Help from another person to put on and taking off regular upper body clothing?: A Little Help from another person to put on and taking off regular  lower body clothing?: A Lot 6 Click Score: 16   End of Session Equipment Utilized During Treatment: Gait belt;Rolling walker Nurse Communication: Mobility status  Activity Tolerance: Patient tolerated treatment well Patient left: in chair;with call bell/phone within reach;with chair alarm set  OT Visit Diagnosis: Unsteadiness on feet (R26.81);Other abnormalities of gait and mobility (R26.89);Other symptoms and signs involving cognitive function                Time: 6301-6010 OT Time Calculation (min): 29 min Charges:  OT General Charges $OT Visit: 1 Visit OT Evaluation $OT Eval Moderate Complexity: 1 Mod OT Treatments $Self Care/Home Management : 8-22 mins  Terris Germano, OTR/L Acute Care Rehab Services  Office [phone] Pager: [phone]   Kelli Churn 06/22/2020, 11:02 AM

## 2020-06-22 NOTE — Progress Notes (Signed)
PROGRESS NOTE    Cory Hansen  ZOX:[id number] DOB: 08-04-1920 DOA: 06/19/2020 PCP: [name], MD    Brief Narrative:  Cory Hansen is a 87 year old male with past medical history significant for bladder cancer, hyperlipidemia, low back pain, who presented to the ED with progressive shortness of breath and lower extremity edema.  Reports weight gain of about 20 pounds in the last few weeks.  Lower extremity edema present now 1-2 weeks.  Patient saw his PCP who found patient in atrial fibrillation, which is new and he was sent to the ED for further evaluation.  In the ED, sodium 145, potassium 4.2, chloride 110, CO2 22, glucose 95, BUN 21, creatinine 1.32.  BNP elevated 472.5.  Troponin 10>9.  WBC 6.1, hemoglobin 12.0, platelets 164.  Covid-19 test positive.  Chest x-ray with small pleural effusions, upper normal heart size.  EKG with A. fib, rate 100, no concerning ST elevation/depressions or T wave inversions.  Hospital service consulted for further evaluation and management of dyspnea and new onset atrial fibrillation.   Assessment & Plan:   Principal Problem:   Acute CHF (HCC) Active Problems:   AKI (acute kidney injury) (HCC)   New onset atrial fibrillation (HCC)   CHF (congestive heart failure) (HCC)   Paroxysmal atrial fibrillation Patient presenting to ED with shortness of breath, found to be in atrial fibrillation.  No previous history.  TSH within normal limits.  TTE with LVEF 55-60%, no LV wall motion abnormalities, normal IVC. --Metoprolol tartrate 12.5mg  PO BID --Started Eliquis 5 mg p.o. twice daily --Continue to monitor on telemetry  Acute diastolic congestive heart failure Patient presented with progressive shortness of breath, lower extremity edema.  Found to have vascular congestion, pleural effusions on chest x-ray with elevated BNP.  TTE with LVEF 55-60% with no regional wall motion abnormalities. --net negative past 24h and net negative 8.0L  since admission --wt 86.6>81.9>76.4kg --Change furosemide to 20 mg p.o. daily --Continue beta-blocker as above --Strict I's and O's and daily weights --Repeat BMP in the a.m.  Covid-19 viral infection Covid-19 test positive on admission.  Vaccinated with Pfizer vaccine on 5/6 and 09/02/2019.  Oxygenating well on room air.  Completed 3-day course of remdesivir.  GERD: Continue PPI  Weakness/deconditioning/debility: Patient being followed by PT and OT.  Continues with difficulty mobilizing from a sitting to standing position with gait disturbance with ability to ambulate roughly 5 feet today with OT.  Discussed with patient's daughter, given his severe deconditioning unable to support at home and elected for SNF placement. --Continue PT/OT efforts while inpatient --TOC for SNF placement   DVT prophylaxis: Eliquis   Code Status: DNR Family Communication: Updated patient's daughter, Harriett Sine this afternoon at bedside  Disposition Plan:  Level of care: Telemetry Status is: Inpatient  Remains inpatient appropriate because:Ongoing diagnostic testing needed not appropriate for outpatient work up, Unsafe d/c plan, IV treatments appropriate due to intensity of illness or inability to take PO and Inpatient level of care appropriate due to severity of illness   Dispo: The patient is from: Home              Anticipated d/c is to: SNF              Patient currently is medically stable to d/c.   Difficult to place patient No   Consultants:   None  Procedures:   TTE  Antimicrobials:   None   Subjective: Patient seen and examined at bedside, resting comfortably.  No specific complaints.  Updated daughter at bedside.  Continues with difficulty ambulating and mobilizing from a lying/sitting to standing position.  Daughter requesting SNF placement for short-term rehab given family cannot support his current level of functioning at home and reports that he did well previously with return home  during the past rehabilitation.  Further denies headache, no fever/chills/night sweats, no nausea/vomiting/diarrhea, no chest pain, palpitations, no shortness of breath, no abdominal pain.  No acute events overnight per nursing staff.  Objective: Vitals:   06/21/20 2037 06/22/20 0500 06/22/20 0513 06/22/20 1249  BP: 114/78  136/85 (!) 98/55  Pulse: 86  (!) 110 81  Resp: 20  20 20   Temp: 98.4 F (36.9 C)  97.6 F (36.4 C) 97.8 F (36.6 C)  TempSrc: Oral  Oral Oral  SpO2: 97%  96% 94%  Weight:  76.4 kg    Height:        Intake/Output Summary (Last 24 hours) at 06/22/2020 1417 Last data filed at 06/22/2020 0600 Gross per 24 hour  Intake --  Output 900 ml  Net -900 ml   Filed Weights   06/20/20 0500 06/21/20 0424 06/22/20 0500  Weight: 81.5 kg 78.4 kg 76.4 kg    Examination:  General exam: Appears calm and comfortable, elderly in appearance Respiratory system: Clear to auscultation. Respiratory effort normal.  Oxygenating well on room air Cardiovascular system: S1 & S2 heard, RRR. No JVD, murmurs, rubs, gallops or clicks. No pedal edema. Gastrointestinal system: Abdomen is nondistended, soft and nontender. No organomegaly or masses felt. Normal bowel sounds heard. Central nervous system: Alert and oriented. No focal neurological deficits. Extremities: Symmetric 5 x 5 power. Skin: No rashes, lesions or ulcers Psychiatry: Judgement and insight appear poor. Mood & affect appropriate.     Data Reviewed: I have personally reviewed following labs and imaging studies  CBC: Recent Labs  Lab 06/19/20 1715 06/20/20 0803  WBC 6.1 7.2  NEUTROABS 3.9  --   HGB 12.0* 12.3*  HCT 38.9* 39.2  MCV 98.5 95.6  PLT 164 163   Basic Metabolic Panel: Recent Labs  Lab 06/19/20 1715 06/19/20 1908 06/20/20 0803 06/22/20 0446  NA 145  --  144 144  K 4.2  --  3.3* 3.8  CL 110  --  106 102  CO2 22  --  28 26  GLUCOSE 95  --  92 88  BUN 21  --  16 22  CREATININE 1.32*  --  1.14 1.51*   CALCIUM 9.4  --  9.1 9.4  MG  --  2.1  --  1.9   GFR: Estimated Creatinine Clearance: 29.8 mL/min (A) (by C-G formula based on SCr of 1.51 mg/dL (H)). Liver Function Tests: No results for input(s): AST, ALT, ALKPHOS, BILITOT, PROT, ALBUMIN in the last 168 hours. No results for input(s): LIPASE, AMYLASE in the last 168 hours. No results for input(s): AMMONIA in the last 168 hours. Coagulation Profile: Recent Labs  Lab 06/19/20 1715  INR 1.1   Cardiac Enzymes: No results for input(s): CKTOTAL, CKMB, CKMBINDEX, TROPONINI in the last 168 hours. BNP (last 3 results) No results for input(s): PROBNP in the last 8760 hours. HbA1C: No results for input(s): HGBA1C in the last 72 hours. CBG: Recent Labs  Lab 06/19/20 2320  GLUCAP 106*   Lipid Profile: Recent Labs    06/22/20 0446  CHOL 176  HDL 55  LDLCALC 116*  TRIG 26  CHOLHDL 3.2   Thyroid Function Tests: Recent Labs  06/19/20 1926  TSH 2.908   Anemia Panel: No results for input(s): VITAMINB12, FOLATE, FERRITIN, TIBC, IRON, RETICCTPCT in the last 72 hours. Sepsis Labs: No results for input(s): PROCALCITON, LATICACIDVEN in the last 168 hours.  Recent Results (from the past 240 hour(s))  SARS CORONAVIRUS 2 (TAT 6-24 HRS) Nasopharyngeal Nasopharyngeal Swab     Status: Abnormal   Collection Time: 06/19/20  7:08 PM   Specimen: Nasopharyngeal Swab  Result Value Ref Range Status   SARS Coronavirus 2 POSITIVE (A) NEGATIVE Final    Comment: (NOTE) SARS-CoV-2 target nucleic acids are DETECTED.  The SARS-CoV-2 RNA is generally detectable in upper and lower respiratory specimens during the acute phase of infection. Positive results are indicative of the presence of SARS-CoV-2 RNA. Clinical correlation with patient history and other diagnostic information is  necessary to determine patient infection status. Positive results do not rule out bacterial infection or co-infection with other viruses.  The expected result is  Negative.  Fact Sheet for Patients: HairSlick.no  Fact Sheet for Healthcare Providers: quierodirigir.com  This test is not yet approved or cleared by the [place] FDA and  has been authorized for detection and/or diagnosis of SARS-CoV-2 by FDA under an Emergency Use Authorization (EUA). This EUA will remain  in effect (meaning this test can be used) for the duration of the COVID-19 declaration under Section 564(b)(1) of the Act, 21 U. S.C. section 360bbb-3(b)(1), unless the authorization is terminated or revoked sooner.   Performed at [facility], [address] [place], [place] [address]          Radiology Studies: ECHOCARDIOGRAM LIMITED  Result Date: 06/20/2020    ECHOCARDIOGRAM LIMITED REPORT   Patient Name:   TAIKI BUCKWALTER Date of Exam: 06/20/2020 Medical Rec #:  [id number]      Height:       71.0 in Accession #:    [id number]     Weight:       179.7 lb Date of Birth:  [birth year]     BSA:          2.015 m Patient Age:    89 years       BP:           128/82 mmHg Patient Gender: M              HR:           102 bpm. Exam Location:  Inpatient Procedure: Limited Echo, Cardiac Doppler and Color Doppler Indications:    Dyspnea  History:        Patient has no prior history of Echocardiogram examinations.                 Arrythmias:Atrial Fibrillation, Signs/Symptoms:Dyspnea, LE edema                 and Altered Mental Status; Risk Factors:Dyslipidemia. COVID+.  Sonographer:    Lavenia Atlas Referring Phys: [id number] Cecille Po MELVIN  Sonographer Comments: Suboptimal apical window. COVID+ IMPRESSIONS  1. Left ventricular ejection fraction, by estimation, is 55 to 60%. The left ventricle has normal function. The left ventricle has no regional wall motion abnormalities. Left ventricular diastolic function could not be evaluated.  2. Right ventricular systolic function is normal. The right ventricular size is normal. There is  mildly elevated pulmonary artery systolic pressure. The estimated right ventricular systolic pressure is 44.7 mmHg.  3. Left atrial size was mildly dilated.  4. The mitral valve is normal in structure. Mild  to moderate mitral valve regurgitation. No evidence of mitral stenosis.  5. Tricuspid valve regurgitation is moderate.  6. The aortic valve is normal in structure. Aortic valve regurgitation is mild. Mild aortic valve sclerosis is present, with no evidence of aortic valve stenosis.  7. The inferior vena cava is normal in size with greater than 50% respiratory variability, suggesting right atrial pressure of 3 mmHg. FINDINGS  Left Ventricle: Left ventricular ejection fraction, by estimation, is 55 to 60%. The left ventricle has normal function. The left ventricle has no regional wall motion abnormalities. The left ventricular internal cavity size was normal in size. There is  no left ventricular hypertrophy. Left ventricular diastolic function could not be evaluated. Left ventricular diastolic function could not be evaluated due to atrial fibrillation. Right Ventricle: The right ventricular size is normal. No increase in right ventricular wall thickness. Right ventricular systolic function is normal. There is mildly elevated pulmonary artery systolic pressure. The tricuspid regurgitant velocity is 3.23  m/s, and with an assumed right atrial pressure of 3 mmHg, the estimated right ventricular systolic pressure is 44.7 mmHg. Left Atrium: Left atrial size was mildly dilated. Right Atrium: Right atrial size was normal in size. Pericardium: There is no evidence of pericardial effusion. Mitral Valve: The mitral valve is normal in structure. Mild mitral annular calcification. Mild to moderate mitral valve regurgitation. No evidence of mitral valve stenosis. Tricuspid Valve: The tricuspid valve is normal in structure. Tricuspid valve regurgitation is moderate . No evidence of tricuspid stenosis. Aortic Valve: The aortic  valve is normal in structure. Aortic valve regurgitation is mild. Mild aortic valve sclerosis is present, with no evidence of aortic valve stenosis. Pulmonic Valve: The pulmonic valve was normal in structure. Pulmonic valve regurgitation is mild. No evidence of pulmonic stenosis. Aorta: The aortic root is normal in size and structure. Venous: The inferior vena cava is normal in size with greater than 50% respiratory variability, suggesting right atrial pressure of 3 mmHg. IAS/Shunts: No atrial level shunt detected by color flow Doppler. LEFT VENTRICLE PLAX 2D LVIDd:         4.80 cm  Diastology LVIDs:         3.40 cm  LV e' medial:    5.44 cm/s LV PW:         1.20 cm  LV E/e' medial:  19.1 LV IVS:        1.30 cm  LV e' lateral:   7.29 cm/s LVOT diam:     2.40 cm  LV E/e' lateral: 14.3 LVOT Area:     4.52 cm  RIGHT VENTRICLE RV S prime:     11.90 cm/s LEFT ATRIUM         Index LA diam:    4.10 cm 2.04 cm/m   AORTA Ao Root diam: 3.30 cm MITRAL VALVE                TRICUSPID VALVE MV Area (PHT): 3.77 cm     TR Peak grad:   41.7 mmHg MV Decel Time: 201 msec     TR Vmax:        323.00 cm/s MV E velocity: 104.00 cm/s MV A velocity: 25.10 cm/s   SHUNTS MV E/A ratio:  4.14         Systemic Diam: 2.40 cm Rachelle HoraMihai Croitoru MD Electronically signed by Thurmon FairMihai Croitoru MD Signature Date/Time: 06/20/2020/3:50:25 PM    Final         Scheduled Meds: . apixaban  5 mg  Oral BID  . diclofenac Sodium  4 g Topical QID  . [START ON 06/23/2020] furosemide  20 mg Oral Daily  . metoprolol tartrate  12.5 mg Oral BID  . multivitamin with minerals  1 tablet Oral Daily  . pantoprazole  40 mg Oral Daily  . sodium chloride flush  3 mL Intravenous Q12H   Continuous Infusions:    LOS: 2 days    Time spent: 36 minutes spent on chart review, discussion with nursing staff, consultants, updating family and interview/physical exam; more than 50% of that time was spent in counseling and/or coordination of care.    Alvira Philips [place],  DO [facility] Available via Epic secure chat 7am-7pm After these hours, please refer to coverage provider listed on [email or web address] 06/22/2020, 2:17 PM

## 2020-06-23 LAB — BASIC METABOLIC PANEL
Anion gap: 13 (ref 5–15)
BUN: 24 mg/dL — ABNORMAL HIGH (ref 8–23)
CO2: 30 mmol/L (ref 22–32)
Calcium: 9.3 mg/dL (ref 8.9–10.3)
Chloride: 99 mmol/L (ref 98–111)
Creatinine, Ser: 1.36 mg/dL — ABNORMAL HIGH (ref 0.61–1.24)
GFR, Estimated: 47 mL/min — ABNORMAL LOW (ref >60)
Glucose, Bld: 89 mg/dL (ref 70–99)
Potassium: 3.2 mmol/L — ABNORMAL LOW (ref 3.5–5.1)
Sodium: 142 mmol/L (ref 135–145)

## 2020-06-23 LAB — MAGNESIUM: Magnesium: 2.5 mg/dL — ABNORMAL HIGH (ref 1.7–2.4)

## 2020-06-23 MED ORDER — POTASSIUM CHLORIDE CRYS ER 20 MEQ PO TBCR
40.0000 meq | EXTENDED_RELEASE_TABLET | ORAL | Status: AC
  Administered 2020-06-23 (×2): 40 meq via ORAL
  Filled 2020-06-23 (×2): qty 2

## 2020-06-23 NOTE — Plan of Care (Signed)
  Problem: Clinical Measurements: Goal: Ability to maintain clinical measurements within normal limits will improve Outcome: Progressing Goal: Will remain free from infection Outcome: Progressing Goal: Diagnostic test results will improve Outcome: Progressing Goal: Respiratory complications will improve Outcome: Progressing Goal: Cardiovascular complication will be avoided Outcome: Progressing   Problem: Coping: Goal: Level of anxiety will decrease Outcome: Progressing   Problem: Elimination: Goal: Will not experience complications related to bowel motility Outcome: Progressing Goal: Will not experience complications related to urinary retention Outcome: Progressing   

## 2020-06-23 NOTE — Care Management Important Message (Signed)
Important Message  Patient Details IM Letter given placed in Patient's door caddy. Name: MUKUND WEINREB MRN: [id number] Date of Birth: May 13, 1920   Medicare Important Message Given:  Yes     Caren Macadam 06/23/2020, 11:04 AM

## 2020-06-23 NOTE — Progress Notes (Signed)
Physical Therapy Treatment Patient Details Name: Cory Hansen MRN: [id number] DOB: [birth year] Today's Date: 06/23/2020    History of Present Illness 87 yo male admitted with acute CHF, COVID(+). Hx of hearing loss, back pain, Afib, shingles, OA, L foot 1st ray amputaiton    PT Comments    Pt with improvement in functional status this session, also daughter in room notes pt to be more alert and closer to baseline than yesterday. Pt tolerates BLE strengthening exercises with cues for motor control. Pt fluctuates between min A and min G assist to power up from seated position, cues for glute/quad activation and UE assist to rise. Cued for return of hand to seated surface to assist in controlled lowering, pt ultimately requiring min A for controlled return to sitting in chair. Pt able to ambulate 20 ft with RW and additional 10 ft after seated rest break. Pt tolerates sidesteps, backwards steps and turns without LOB. Overall, pt limited by chronic R knee pain, no knee buckling noted with mobility. Pt pleasant, motivated and without SOB throughout session. Daughter in room states family is unable to assist pt at home if he is needing physical assistance, continue to recommend SNF vs HHPT pending progress acutely. Will progress as able.  Follow Up Recommendations  Home health PT;Supervision/Assistance - 24 hour;SNF (depending on continued progress and family input)     Equipment Recommendations  None recommended by PT    Recommendations for Other Services       Precautions / Restrictions Precautions Precautions: Fall Precaution Comments: R knee pain, arthritis Restrictions Weight Bearing Restrictions: No    Mobility  Bed Mobility Overal bed mobility: Modified Independent Bed Mobility: Sit to Supine    General bed mobility comments: pt able to lift BLE back into bed and reposition to comfort in center of bed without cues or assistance    Transfers Overall transfer level: Needs  assistance Equipment used: Rolling walker (2 wheeled) Transfers: Sit to/from Stand Sit to Stand: Min assist;Min guard    General transfer comment: pt varies from min A to min G with powering up to stand from chair and EOB, cues for quad/glute activation and UE assisting to power up  Ambulation/Gait Ambulation/Gait assistance: Min guard Gait Distance (Feet): 20 Feet (additional 10' after seated rest break) Assistive device: Rolling walker (2 wheeled) Gait Pattern/deviations: Step-to pattern;Decreased stride length;Trunk flexed Gait velocity: decreased   General Gait Details: pt with trunk slightly flexed, knees maintained at ~20 deg knee flexion throughout gait cycle, able to complete turns, take sidesteps, take backward steps without LOB, limited by R knee pain   Stairs             Wheelchair Mobility    Modified Rankin (Stroke Patients Only)       Balance Overall balance assessment: Needs assistance Sitting-balance support: Feet supported Sitting balance-Leahy Scale: Good  Standing balance support: Bilateral upper extremity supported;During functional activity Standing balance-Leahy Scale: Poor Standing balance comment: reliant on UE support     Cognition Arousal/Alertness: Awake/alert Behavior During Therapy: WFL for tasks assessed/performed Overall Cognitive Status: Within Functional Limits for tasks assessed  General Comments: Pt's daughter in room during session, pt appears to be at baseline cognitively.      Exercises General Exercises - Lower Extremity Long Arc Quad: Seated;AROM;Strengthening;Both;20 reps Other Exercises Other Exercises: STS, 5 reps with RW, minA-min G    General Comments General comments (skin integrity, edema, etc.): No SOB with ambulation, able to continue conversation  Pertinent Vitals/Pain Pain Assessment: Faces Faces Pain Scale: Hurts a little bit Pain Location: R knee with standing Pain Descriptors / Indicators:  Grimacing;Discomfort Pain Intervention(s): Limited activity within patient's tolerance;Monitored during session    Home Living                      Prior Function            PT Goals (current goals can now be found in the care plan section) Acute Rehab PT Goals Patient Stated Goal: home PT Goal Formulation: With patient Time For Goal Achievement: 07/05/20 Potential to Achieve Goals: Good Progress towards PT goals: Progressing toward goals    Frequency    Min 3X/week      PT Plan Current plan remains appropriate    Co-evaluation              AM-PAC PT "6 Clicks" Mobility   Outcome Measure  Help needed turning from your back to your side while in a flat bed without using bedrails?: None Help needed moving from lying on your back to sitting on the side of a flat bed without using bedrails?: None Help needed moving to and from a bed to a chair (including a wheelchair)?: A Little Help needed standing up from a chair using your arms (e.g., wheelchair or bedside chair)?: A Little Help needed to walk in hospital room?: A Little Help needed climbing 3-5 steps with a railing? : A Lot 6 Click Score: 19    End of Session Equipment Utilized During Treatment: Gait belt Activity Tolerance: Patient tolerated treatment well Patient left: in bed;with call bell/phone within reach;with bed alarm set;with family/visitor present Nurse Communication: Mobility status PT Visit Diagnosis: Muscle weakness (generalized) (M62.81);Other abnormalities of gait and mobility (R26.89)     Time: 6811-5726 PT Time Calculation (min) (ACUTE ONLY): 25 min  Charges:  $Gait Training: 8-22 mins $Therapeutic Exercise: 8-22 mins                      Tori Acel Natzke PT, DPT 06/23/20, 2:50 PM

## 2020-06-23 NOTE — TOC Progression Note (Signed)
Transition of Care [facility]) - Progression Note    Patient Details  Name: Cory Hansen MRN: [id number] Date of Birth: 09/17/1920  [facility]) CM/SW Contact  Geni Bers, RN Phone Number: 06/23/2020, 3:28 PM  Clinical Narrative:    Spoke with pt's daughter [name] at bedside concerning discharge plans. [name] states that family would like for pt to go to SNF. Explained at at present time. There are no COVID beds available until after isolation period. Fax pt out to SNF. Waiting for bed offers.   Expected Discharge Plan: Skilled Nursing Facility Barriers to Discharge: No Barriers Identified  Expected Discharge Plan and Services Expected Discharge Plan: Skilled Nursing Facility       Living arrangements for the past 2 months: Single Family Home                                       Social Determinants of Health (SDOH) Interventions    Readmission Risk Interventions No flowsheet data found.

## 2020-06-23 NOTE — [place] FL2 (Signed)
[place] MEDICAID FL2 LEVEL OF CARE SCREENING TOOL     IDENTIFICATION  Patient Name: Cory Hansen Birthdate: October 18, 1920 Sex: male Admission Date (Current Location): 06/19/2020  [facility] and [place] Number:  Producer, television/film/video and Address:  [facility],  [address] [place], [place] [address]      Provider Number: [id number]  Attending Physician Name and Address:  [place], Eric J, DO  Relative Name and Phone Number:  Malhotra,Shirley Daughter [phone]    Current Level of Care: Hospital Recommended Level of Care: Skilled Nursing Facility Prior Approval Number:    Date Approved/Denied:   PASRR Number: [id number]  Discharge Plan: SNF    Current Diagnoses: Patient Active Problem List   Diagnosis Date Noted  . CHF (congestive heart failure) (HCC) 06/20/2020  . New onset atrial fibrillation (HCC) 06/19/2020  . Acute CHF (HCC) 06/19/2020  . AKI (acute kidney injury) (HCC) 03/14/2020  . Hypokalemia 03/14/2020  . SOB (shortness of breath) 08/19/2019  . Acute right ankle pain 05/07/2019  . Diarrhea 10/27/2015  . Urinary dribbling 07/26/2015  . Routine general medical examination at a health care facility 10/13/2014  . CT, CHEST, ABNORMAL 01/23/2009  . Chronic bilateral low back pain with right-sided sciatica 01/17/2009  . NEOPLASM, MALIGNANT, BLADDER, HX OF 01/17/2009  . Hx of benign neoplasm of prostate 01/17/2009  . Hyperlipidemia 01/16/2009  . ARTHRITIS 01/16/2009  . Weakness 01/16/2009    Orientation RESPIRATION BLADDER Height & Weight     Self  Normal Incontinent Weight: 74 kg Height:  5\' 11"  (180.3 cm)  BEHAVIORAL SYMPTOMS/MOOD NEUROLOGICAL BOWEL NUTRITION STATUS      Incontinent Diet (Heart Healthy)  AMBULATORY STATUS COMMUNICATION OF NEEDS Skin   Extensive Assist Verbally Normal                       Personal Care Assistance Level of Assistance  Bathing,Feeding,Dressing Bathing Assistance: Maximum assistance Feeding assistance:  Maximum assistance Dressing Assistance: Maximum assistance     Functional Limitations Info  Sight,Hearing,Speech Sight Info: Impaired ) Hearing Info: Impaired Speech Info: Adequate    SPECIAL CARE FACTORS FREQUENCY  PT (By licensed PT),OT (By licensed OT)     PT Frequency: x5 week OT Frequency: x5 week            Contractures Contractures Info: Not present    Additional Factors Info  Code Status,Allergies Code Status Info: DNR Allergies Info: No Known Allergies           Current Medications (06/23/2020):  This is the current hospital active medication list Current Facility-Administered Medications  Medication Dose Route Frequency Provider Last Rate Last Admin  . acetaminophen (TYLENOL) tablet 650 mg  650 mg Oral Q6H PRN 06/25/2020, MD   650 mg at 06/20/20 1755   Or  . acetaminophen (TYLENOL) suppository 650 mg  650 mg Rectal Q6H PRN 06/22/20, MD      . apixaban Synetta Fail) tablet 5 mg  5 mg Oral BID Everlene Balls, MD   5 mg at 06/23/20 0848  . diclofenac Sodium (VOLTAREN) 1 % topical gel 4 g  4 g Topical QID 06/25/20, Eric J, DO   4 g at 06/23/20 06/25/20  . furosemide (LASIX) tablet 20 mg  20 mg Oral Daily [id number], [place], DO   20 mg at 06/23/20 0846  . haloperidol lactate (HALDOL) injection 2 mg  2 mg Intravenous Q6H PRN Opyd, 06/25/20, MD   2 mg at  06/19/20 2224  . metoprolol tartrate (LOPRESSOR) injection 5 mg  5 mg Intravenous Q2H PRN Opyd, Lavone Neri, MD   5 mg at 06/20/20 0002  . metoprolol tartrate (LOPRESSOR) tablet 12.5 mg  12.5 mg Oral BID [place], Eric J, DO   12.5 mg at 06/23/20 0845  . multivitamin with minerals tablet 1 tablet  1 tablet Oral Daily Synetta Fail, MD   1 tablet at 06/23/20 0850  . pantoprazole (PROTONIX) EC tablet 40 mg  40 mg Oral Daily Synetta Fail, MD   40 mg at 06/23/20 0844  . polyethylene glycol (MIRALAX / GLYCOLAX) packet 17 g  17 g Oral Daily PRN Synetta Fail, MD      . sodium  chloride flush (NS) 0.9 % injection 3 mL  3 mL Intravenous Q12H Synetta Fail, MD   3 mL at 06/23/20 0938     Discharge Medications: Please see discharge summary for a list of discharge medications.  Relevant Imaging Results:  Relevant Lab Results:   Additional Information SS#[id number]  Geni Bers, RN

## 2020-06-23 NOTE — Plan of Care (Signed)

## 2020-06-23 NOTE — Progress Notes (Addendum)
PROGRESS NOTE    Cory Hansen  ZOX:[id number] DOB: 02-Sep-1920 DOA: 06/19/2020 PCP: [name], MD    Brief Narrative:  Cory Hansen is a 87 year old male with past medical history significant for bladder cancer, hyperlipidemia, low back pain, who presented to the ED with progressive shortness of breath and lower extremity edema.  Reports weight gain of about 20 pounds in the last few weeks.  Lower extremity edema present now 1-2 weeks.  Patient saw his PCP who found patient in atrial fibrillation, which is new and he was sent to the ED for further evaluation.  In the ED, sodium 145, potassium 4.2, chloride 110, CO2 22, glucose 95, BUN 21, creatinine 1.32.  BNP elevated 472.5.  Troponin 10>9.  WBC 6.1, hemoglobin 12.0, platelets 164.  Covid-19 test positive.  Chest x-ray with small pleural effusions, upper normal heart size.  EKG with A. fib, rate 100, no concerning ST elevation/depressions or T wave inversions.  Hospital service consulted for further evaluation and management of dyspnea and new onset atrial fibrillation.   Assessment & Plan:   Principal Problem:   Acute CHF (HCC) Active Problems:   AKI (acute kidney injury) (HCC)   New onset atrial fibrillation (HCC)   CHF (congestive heart failure) (HCC)   Paroxysmal atrial fibrillation Patient presenting to ED with shortness of breath, found to be in atrial fibrillation.  No previous history.  TSH within normal limits.  TTE with LVEF 55-60%, no LV wall motion abnormalities, normal IVC. --Metoprolol tartrate 12.5mg  PO BID --Started Eliquis 5 mg p.o. twice daily --Continue to monitor on telemetry  Acute diastolic congestive heart failure Patient presented with progressive shortness of breath, lower extremity edema.  Found to have vascular congestion, pleural effusions on chest x-ray with elevated BNP.  TTE with LVEF 55-60% with no regional wall motion abnormalities. --net negative past 24h and net negative 8.0L  since admission --wt 86.6>81.9>76.4kg --Furosemide 20 mg p.o. daily --Continue beta-blocker as above --Strict I's and O's and daily weights --Repeat BMP in the a.m.  Covid-19 viral infection Covid-19 test positive on admission.  Vaccinated with Pfizer vaccine on 5/6 and 09/02/2019.  Oxygenating well on room air.  Completed 3-day course of remdesivir.  Isolation on 06/29/2020.  Hypokalemia Potassium 3.2, will replete. --Repeat potassium and magnesium level in a.m.  GERD: Continue PPI  Weakness/deconditioning/debility: Patient being followed by PT and OT.  Continues with difficulty mobilizing from a sitting to standing position with gait disturbance with ability to ambulate roughly 5 feet today with OT.  Discussed with patient's daughter, given his severe deconditioning unable to support at home and elected for SNF placement. --Continue PT/OT efforts while inpatient --TOC for SNF placement   DVT prophylaxis: Eliquis   Code Status: DNR Family Communication: Updated patient's daughter, Harriett Sine this afternoon at bedside  Disposition Plan:  Level of care: Telemetry Status is: Inpatient  Remains inpatient appropriate because:Ongoing diagnostic testing needed not appropriate for outpatient work up, Unsafe d/c plan, IV treatments appropriate due to intensity of illness or inability to take PO and Inpatient level of care appropriate due to severity of illness   Dispo: The patient is from: Home              Anticipated d/c is to: SNF              Patient currently is medically stable to d/c.   Difficult to place patient No   Consultants:   None  Procedures:   TTE  Antimicrobials:   None   Subjective: Patient seen and examined at bedside, resting comfortably.  No specific complaints.  Updated daughter at bedside.  Continues with difficulty ambulating and mobilizing from a lying/sitting to standing position.  Patient denies headache, no fever/chills/night sweats, no  nausea/vomiting/diarrhea, no chest pain, palpitations, no shortness of breath, no abdominal pain.  No acute events overnight per nursing staff.  Objective: Vitals:   06/22/20 1943 06/22/20 1944 06/23/20 0500 06/23/20 0543  BP: 123/70   140/81  Pulse: 76   85  Resp: 16   16  Temp: (!) 97.3 F (36.3 C) 97.7 F (36.5 C)  97.7 F (36.5 C)  TempSrc: Oral Oral  Oral  SpO2: 90%   94%  Weight:   74 kg   Height:        Intake/Output Summary (Last 24 hours) at 06/23/2020 1447 Last data filed at 06/23/2020 0618 Gross per 24 hour  Intake -  Output 600 ml  Net -600 ml   Filed Weights   06/21/20 0424 06/22/20 0500 06/23/20 0500  Weight: 78.4 kg 76.4 kg 74 kg    Examination:  General exam: Appears calm and comfortable, elderly in appearance Respiratory system: Clear to auscultation. Respiratory effort normal.  Oxygenating well on room air Cardiovascular system: S1 & S2 heard, RRR. No JVD, murmurs, rubs, gallops or clicks. No pedal edema. Gastrointestinal system: Abdomen is nondistended, soft and nontender. No organomegaly or masses felt. Normal bowel sounds heard. Central nervous system: Alert and oriented. No focal neurological deficits. Extremities: Symmetric 5 x 5 power. Skin: No rashes, lesions or ulcers Psychiatry: Judgement and insight appear poor. Mood & affect appropriate.     Data Reviewed: I have personally reviewed following labs and imaging studies  CBC: Recent Labs  Lab 06/19/20 1715 06/20/20 0803  WBC 6.1 7.2  NEUTROABS 3.9  --   HGB 12.0* 12.3*  HCT 38.9* 39.2  MCV 98.5 95.6  PLT 164 163   Basic Metabolic Panel: Recent Labs  Lab 06/19/20 1715 06/19/20 1908 06/20/20 0803 06/22/20 0446 06/23/20 0431  NA 145  --  144 144 142  K 4.2  --  3.3* 3.8 3.2*  CL 110  --  106 102 99  CO2 22  --  28 26 30   GLUCOSE 95  --  92 88 89  BUN 21  --  16 22 24*  CREATININE 1.32*  --  1.14 1.51* 1.36*  CALCIUM 9.4  --  9.1 9.4 9.3  MG  --  2.1  --  1.9 2.5*    GFR: Estimated Creatinine Clearance: 32.5 mL/min (A) (by C-G formula based on SCr of 1.36 mg/dL (H)). Liver Function Tests: No results for input(s): AST, ALT, ALKPHOS, BILITOT, PROT, ALBUMIN in the last 168 hours. No results for input(s): LIPASE, AMYLASE in the last 168 hours. No results for input(s): AMMONIA in the last 168 hours. Coagulation Profile: Recent Labs  Lab 06/19/20 1715  INR 1.1   Cardiac Enzymes: No results for input(s): CKTOTAL, CKMB, CKMBINDEX, TROPONINI in the last 168 hours. BNP (last 3 results) No results for input(s): PROBNP in the last 8760 hours. HbA1C: No results for input(s): HGBA1C in the last 72 hours. CBG: Recent Labs  Lab 06/19/20 2320  GLUCAP 106*   Lipid Profile: Recent Labs    06/22/20 0446  CHOL 176  HDL 55  LDLCALC 116*  TRIG 26  CHOLHDL 3.2   Thyroid Function Tests: No results for input(s): TSH, T4TOTAL, FREET4, T3FREE, THYROIDAB in  the last 72 hours. Anemia Panel: No results for input(s): VITAMINB12, FOLATE, FERRITIN, TIBC, IRON, RETICCTPCT in the last 72 hours. Sepsis Labs: No results for input(s): PROCALCITON, LATICACIDVEN in the last 168 hours.  Recent Results (from the past 240 hour(s))  SARS CORONAVIRUS 2 (TAT 6-24 HRS) Nasopharyngeal Nasopharyngeal Swab     Status: Abnormal   Collection Time: 06/19/20  7:08 PM   Specimen: Nasopharyngeal Swab  Result Value Ref Range Status   SARS Coronavirus 2 POSITIVE (A) NEGATIVE Final    Comment: (NOTE) SARS-CoV-2 target nucleic acids are DETECTED.  The SARS-CoV-2 RNA is generally detectable in upper and lower respiratory specimens during the acute phase of infection. Positive results are indicative of the presence of SARS-CoV-2 RNA. Clinical correlation with patient history and other diagnostic information is  necessary to determine patient infection status. Positive results do not rule out bacterial infection or co-infection with other viruses.  The expected result is  Negative.  Fact Sheet for Patients: HairSlick.no  Fact Sheet for Healthcare Providers: quierodirigir.com  This test is not yet approved or cleared by the [place] FDA and  has been authorized for detection and/or diagnosis of SARS-CoV-2 by FDA under an Emergency Use Authorization (EUA). This EUA will remain  in effect (meaning this test can be used) for the duration of the COVID-19 declaration under Section 564(b)(1) of the Act, 21 U. S.C. section 360bbb-3(b)(1), unless the authorization is terminated or revoked sooner.   Performed at [facility], [address] [place], [place] [address]          Radiology Studies: No results found.      Scheduled Meds: . apixaban  5 mg Oral BID  . diclofenac Sodium  4 g Topical QID  . furosemide  20 mg Oral Daily  . metoprolol tartrate  12.5 mg Oral BID  . multivitamin with minerals  1 tablet Oral Daily  . pantoprazole  40 mg Oral Daily  . potassium chloride  40 mEq Oral Q3H  . sodium chloride flush  3 mL Intravenous Q12H   Continuous Infusions:    LOS: 3 days    Time spent: 35 minutes spent on chart review, discussion with nursing staff, consultants, updating family and interview/physical exam; more than 50% of that time was spent in counseling and/or coordination of care.    Alvira Philips [place], DO [facility] Available via Epic secure chat 7am-7pm After these hours, please refer to coverage provider listed on [email or web address] 06/23/2020, 2:47 PM

## 2020-06-24 LAB — BASIC METABOLIC PANEL
Anion gap: 12 (ref 5–15)
BUN: 31 mg/dL — ABNORMAL HIGH (ref 8–23)
CO2: 29 mmol/L (ref 22–32)
Calcium: 9.2 mg/dL (ref 8.9–10.3)
Chloride: 100 mmol/L (ref 98–111)
Creatinine, Ser: 1.57 mg/dL — ABNORMAL HIGH (ref 0.61–1.24)
GFR, Estimated: 40 mL/min — ABNORMAL LOW (ref >60)
Glucose, Bld: 84 mg/dL (ref 70–99)
Potassium: 4.1 mmol/L (ref 3.5–5.1)
Sodium: 141 mmol/L (ref 135–145)

## 2020-06-24 LAB — MAGNESIUM: Magnesium: 2.3 mg/dL (ref 1.7–2.4)

## 2020-06-24 NOTE — Plan of Care (Signed)
°  Problem: Clinical Measurements: Goal: Ability to maintain clinical measurements within normal limits will improve Outcome: Progressing Goal: Will remain free from infection Outcome: Progressing Goal: Diagnostic test results will improve Outcome: Progressing Goal: Respiratory complications will improve Outcome: Progressing Goal: Cardiovascular complication will be avoided Outcome: Progressing   Problem: Activity: Goal: Risk for activity intolerance will decrease Outcome: Progressing   Problem: Nutrition: Goal: Adequate nutrition will be maintained Outcome: Progressing   Problem: Coping: Goal: Level of anxiety will decrease Outcome: Progressing

## 2020-06-24 NOTE — Progress Notes (Signed)
PROGRESS NOTE    Cory Hansen  TKZ:[id number] DOB: 11/28/1920 DOA: 06/19/2020 PCP: [name], MD    Brief Narrative:  Cory Hansen is a 87 year old male with past medical history significant for bladder cancer, hyperlipidemia, low back pain, who presented to the ED with progressive shortness of breath and lower extremity edema.  Reports weight gain of about 20 pounds in the last few weeks.  Lower extremity edema present now 1-2 weeks.  Patient saw his PCP who found patient in atrial fibrillation, which is new and he was sent to the ED for further evaluation.  In the ED, sodium 145, potassium 4.2, chloride 110, CO2 22, glucose 95, BUN 21, creatinine 1.32.  BNP elevated 472.5.  Troponin 10>9.  WBC 6.1, hemoglobin 12.0, platelets 164.  Covid-19 test positive.  Chest x-ray with small pleural effusions, upper normal heart size.  EKG with A. fib, rate 100, no concerning ST elevation/depressions or T wave inversions.  Hospital service consulted for further evaluation and management of dyspnea and new onset atrial fibrillation.   Assessment & Plan:   Principal Problem:   Acute CHF (HCC) Active Problems:   AKI (acute kidney injury) (HCC)   New onset atrial fibrillation (HCC)   CHF (congestive heart failure) (HCC)   Paroxysmal atrial fibrillation Patient presenting to ED with shortness of breath, found to be in atrial fibrillation.  No previous history.  TSH within normal limits.  TTE with LVEF 55-60%, no LV wall motion abnormalities, normal IVC. --Metoprolol tartrate 12.5mg  PO BID --Started Eliquis 5 mg p.o. twice daily --Continue to monitor on telemetry  Acute diastolic congestive heart failure Patient presented with progressive shortness of breath, lower extremity edema.  Found to have vascular congestion, pleural effusions on chest x-ray with elevated BNP.  TTE with LVEF 55-60% with no regional wall motion abnormalities. --net negative past 24h and net negative 8.0L  since admission --wt 86.6>81.9>76.4>74.3kg --Continue beta-blocker as above --Strict I's and O's and daily weights --Repeat BMP in the a.m.  Covid-19 viral infection Covid-19 test positive on admission.  Vaccinated with Pfizer vaccine on 5/6 and 09/02/2019.  Oxygenating well on room air.  Completed 3-day course of remdesivir.  Isolation on 06/29/2020.  Hypokalemia Potassium 4.1,  --Repeat potassium and magnesium level in a.m.  GERD: Continue PPI  Weakness/deconditioning/debility: Patient being followed by PT and OT.  Continues with difficulty mobilizing from a sitting to standing position with gait disturbance with ability to ambulate roughly 5 feet today with OT.  Discussed with patient's daughter, given his severe deconditioning unable to support at home and elected for SNF placement. --Continue PT/OT efforts while inpatient --TOC for SNF placement   DVT prophylaxis: Eliquis   Code Status: DNR Family Communication: No family present at bedside this morning, updated patient's daughter Harriett Sine yesterday afternoon  Disposition Plan:  Level of care: Telemetry Status is: Inpatient  Remains inpatient appropriate because:Ongoing diagnostic testing needed not appropriate for outpatient work up, Unsafe d/c plan, IV treatments appropriate due to intensity of illness or inability to take PO and Inpatient level of care appropriate due to severity of illness   Dispo: The patient is from: Home              Anticipated d/c is to: SNF              Patient currently is medically stable to d/c.   Difficult to place patient No   Consultants:   None  Procedures:   TTE  Antimicrobials:  None   Subjective: Patient seen and examined at bedside, resting comfortably.  No specific complaints.  No family present.  Patient denies headache, no fever/chills/night sweats, no nausea/vomiting/diarrhea, no chest pain, palpitations, no shortness of breath, no abdominal pain.  No acute events  overnight per nursing staff.  Pending SNF placement.  Objective: Vitals:   06/23/20 1525 06/23/20 2210 06/23/20 2342 06/24/20 0652  BP: 100/62  123/77 132/76  Pulse: 82  98 98  Resp: 18  16 18   Temp: (!) 97.4 F (36.3 C)  98.3 F (36.8 C) 98.6 F (37 C)  TempSrc: Oral  Oral Oral  SpO2: 94%  97% 99%  Weight:  74.3 kg    Height:        Intake/Output Summary (Last 24 hours) at 06/24/2020 1306 Last data filed at 06/24/2020 0400 Gross per 24 hour  Intake 240 ml  Output 500 ml  Net -260 ml   Filed Weights   06/22/20 0500 06/23/20 0500 06/23/20 2210  Weight: 76.4 kg 74 kg 74.3 kg    Examination:  General exam: Appears calm and comfortable, elderly in appearance Respiratory system: Clear to auscultation. Respiratory effort normal.  Oxygenating well on room air Cardiovascular system: S1 & S2 heard, RRR. No JVD, murmurs, rubs, gallops or clicks. No pedal edema. Gastrointestinal system: Abdomen is nondistended, soft and nontender. No organomegaly or masses felt. Normal bowel sounds heard. Central nervous system: Alert and oriented. No focal neurological deficits. Extremities: Symmetric 5 x 5 power. Skin: No rashes, lesions or ulcers Psychiatry: Judgement and insight appear poor. Mood & affect appropriate.     Data Reviewed: I have personally reviewed following labs and imaging studies  CBC: Recent Labs  Lab 06/19/20 1715 06/20/20 0803  WBC 6.1 7.2  NEUTROABS 3.9  --   HGB 12.0* 12.3*  HCT 38.9* 39.2  MCV 98.5 95.6  PLT 164 163   Basic Metabolic Panel: Recent Labs  Lab 06/19/20 1715 06/19/20 1908 06/20/20 0803 06/22/20 0446 06/23/20 0431 06/24/20 0425  NA 145  --  144 144 142 141  K 4.2  --  3.3* 3.8 3.2* 4.1  CL 110  --  106 102 99 100  CO2 22  --  28 26 30 29   GLUCOSE 95  --  92 88 89 84  BUN 21  --  16 22 24* 31*  CREATININE 1.32*  --  1.14 1.51* 1.36* 1.57*  CALCIUM 9.4  --  9.1 9.4 9.3 9.2  MG  --  2.1  --  1.9 2.5* 2.3   GFR: Estimated Creatinine  Clearance: 28.3 mL/min (A) (by C-G formula based on SCr of 1.57 mg/dL (H)). Liver Function Tests: No results for input(s): AST, ALT, ALKPHOS, BILITOT, PROT, ALBUMIN in the last 168 hours. No results for input(s): LIPASE, AMYLASE in the last 168 hours. No results for input(s): AMMONIA in the last 168 hours. Coagulation Profile: Recent Labs  Lab 06/19/20 1715  INR 1.1   Cardiac Enzymes: No results for input(s): CKTOTAL, CKMB, CKMBINDEX, TROPONINI in the last 168 hours. BNP (last 3 results) No results for input(s): PROBNP in the last 8760 hours. HbA1C: No results for input(s): HGBA1C in the last 72 hours. CBG: Recent Labs  Lab 06/19/20 2320  GLUCAP 106*   Lipid Profile: Recent Labs    06/22/20 0446  CHOL 176  HDL 55  LDLCALC 116*  TRIG 26  CHOLHDL 3.2   Thyroid Function Tests: No results for input(s): TSH, T4TOTAL, FREET4, T3FREE, THYROIDAB in the  last 72 hours. Anemia Panel: No results for input(s): VITAMINB12, FOLATE, FERRITIN, TIBC, IRON, RETICCTPCT in the last 72 hours. Sepsis Labs: No results for input(s): PROCALCITON, LATICACIDVEN in the last 168 hours.  Recent Results (from the past 240 hour(s))  SARS CORONAVIRUS 2 (TAT 6-24 HRS) Nasopharyngeal Nasopharyngeal Swab     Status: Abnormal   Collection Time: 06/19/20  7:08 PM   Specimen: Nasopharyngeal Swab  Result Value Ref Range Status   SARS Coronavirus 2 POSITIVE (A) NEGATIVE Final    Comment: (NOTE) SARS-CoV-2 target nucleic acids are DETECTED.  The SARS-CoV-2 RNA is generally detectable in upper and lower respiratory specimens during the acute phase of infection. Positive results are indicative of the presence of SARS-CoV-2 RNA. Clinical correlation with patient history and other diagnostic information is  necessary to determine patient infection status. Positive results do not rule out bacterial infection or co-infection with other viruses.  The expected result is Negative.  Fact Sheet for  Patients: HairSlick.no  Fact Sheet for Healthcare Providers: quierodirigir.com  This test is not yet approved or cleared by the [place] FDA and  has been authorized for detection and/or diagnosis of SARS-CoV-2 by FDA under an Emergency Use Authorization (EUA). This EUA will remain  in effect (meaning this test can be used) for the duration of the COVID-19 declaration under Section 564(b)(1) of the Act, 21 U. S.C. section 360bbb-3(b)(1), unless the authorization is terminated or revoked sooner.   Performed at [facility], [address] [place], [place] [address]          Radiology Studies: No results found.      Scheduled Meds: . apixaban  5 mg Oral BID  . diclofenac Sodium  4 g Topical QID  . metoprolol tartrate  12.5 mg Oral BID  . multivitamin with minerals  1 tablet Oral Daily  . pantoprazole  40 mg Oral Daily  . sodium chloride flush  3 mL Intravenous Q12H   Continuous Infusions:    LOS: 4 days    Time spent: 35 minutes spent on chart review, discussion with nursing staff, consultants, updating family and interview/physical exam; more than 50% of that time was spent in counseling and/or coordination of care.    Alvira Philips [place], DO [facility] Available via Epic secure chat 7am-7pm After these hours, please refer to coverage provider listed on [email or web address] 06/24/2020, 1:06 PM

## 2020-06-25 LAB — BASIC METABOLIC PANEL
Anion gap: 11 (ref 5–15)
BUN: 30 mg/dL — ABNORMAL HIGH (ref 8–23)
CO2: 28 mmol/L (ref 22–32)
Calcium: 9.4 mg/dL (ref 8.9–10.3)
Chloride: 100 mmol/L (ref 98–111)
Creatinine, Ser: 1.39 mg/dL — ABNORMAL HIGH (ref 0.61–1.24)
GFR, Estimated: 46 mL/min — ABNORMAL LOW (ref >60)
Glucose, Bld: 94 mg/dL (ref 70–99)
Potassium: 4.1 mmol/L (ref 3.5–5.1)
Sodium: 139 mmol/L (ref 135–145)

## 2020-06-25 NOTE — Plan of Care (Signed)
  Problem: Education: Goal: Knowledge of General Education information will improve Description: Including pain rating scale, medication(s)/side effects and non-pharmacologic comfort measures Outcome: Progressing   Problem: Health Behavior/Discharge Planning: Goal: Ability to manage health-related needs will improve Outcome: Progressing   Problem: Clinical Measurements: Goal: Ability to maintain clinical measurements within normal limits will improve Outcome: Progressing Goal: Will remain free from infection Outcome: Progressing Goal: Diagnostic test results will improve Outcome: Progressing Goal: Respiratory complications will improve Outcome: Progressing Goal: Cardiovascular complication will be avoided Outcome: Progressing   Problem: Activity: Goal: Risk for activity intolerance will decrease Outcome: Progressing   Problem: Nutrition: Goal: Adequate nutrition will be maintained Outcome: Progressing   Problem: Coping: Goal: Level of anxiety will decrease Outcome: Progressing   Problem: Elimination: Goal: Will not experience complications related to bowel motility Outcome: Progressing Goal: Will not experience complications related to urinary retention Outcome: Progressing   Problem: Pain Managment: Goal: General experience of comfort will improve Outcome: Progressing   Problem: Safety: Goal: Ability to remain free from injury will improve Outcome: Progressing   Problem: Skin Integrity: Goal: Risk for impaired skin integrity will decrease Outcome: Progressing   Problem: Education: Goal: Knowledge of disease or condition will improve Outcome: Progressing Goal: Understanding of medication regimen will improve Outcome: Progressing Goal: Individualized Educational Video(s) Outcome: Progressing   Problem: Activity: Goal: Ability to tolerate increased activity will improve Outcome: Progressing   Problem: Cardiac: Goal: Ability to achieve and maintain  adequate cardiopulmonary perfusion will improve Outcome: Progressing   Problem: Health Behavior/Discharge Planning: Goal: Ability to safely manage health-related needs after discharge will improve Outcome: Progressing   Problem: Safety: Goal: Non-violent Restraint(s) Outcome: Progressing   Problem: Education: Goal: Knowledge of risk factors and measures for prevention of condition will improve Outcome: Progressing   Problem: Coping: Goal: Psychosocial and spiritual needs will be supported Outcome: Progressing   Problem: Respiratory: Goal: Will maintain a patent airway Outcome: Progressing Goal: Complications related to the disease process, condition or treatment will be avoided or minimized Outcome: Progressing

## 2020-06-25 NOTE — Progress Notes (Signed)
PROGRESS NOTE    Cory Hansen  AYT:[id number] DOB: July 13, 1920 DOA: 06/19/2020 PCP: [name], MD    Brief Narrative:  Cory Hansen is a 87 year old male with past medical history significant for bladder cancer, hyperlipidemia, low back pain, who presented to the ED with progressive shortness of breath and lower extremity edema.  Reports weight gain of about 20 pounds in the last few weeks.  Lower extremity edema present now 1-2 weeks.  Patient saw his PCP who found patient in atrial fibrillation, which is new and he was sent to the ED for further evaluation.  In the ED, sodium 145, potassium 4.2, chloride 110, CO2 22, glucose 95, BUN 21, creatinine 1.32.  BNP elevated 472.5.  Troponin 10>9.  WBC 6.1, hemoglobin 12.0, platelets 164.  Covid-19 test positive.  Chest x-ray with small pleural effusions, upper normal heart size.  EKG with A. fib, rate 100, no concerning ST elevation/depressions or T wave inversions.  Hospital service consulted for further evaluation and management of dyspnea and new onset atrial fibrillation.   Assessment & Plan:   Principal Problem:   Acute CHF (HCC) Active Problems:   AKI (acute kidney injury) (HCC)   New onset atrial fibrillation (HCC)   CHF (congestive heart failure) (HCC)   Paroxysmal atrial fibrillation Patient presenting to ED with shortness of breath, found to be in atrial fibrillation.  No previous history.  TSH within normal limits.  TTE with LVEF 55-60%, no LV wall motion abnormalities, normal IVC. --Metoprolol tartrate 12.5mg  PO BID --Started Eliquis 5 mg p.o. twice daily --Continue to monitor on telemetry  Acute diastolic congestive heart failure Patient presented with progressive shortness of breath, lower extremity edema.  Found to have vascular congestion, pleural effusions on chest x-ray with elevated BNP.  TTE with LVEF 55-60% with no regional wall motion abnormalities. --net negative past 24h and net negative 8.0L  since admission --wt 86.6>81.9>76.4>74.3>77.6kg --Continue beta-blocker as above --Strict I's and O's and daily weights --Repeat BMP in the a.m.  Covid-19 viral infection Covid-19 test positive on admission.  Vaccinated with Pfizer vaccine on 5/6 and 09/02/2019.  Oxygenating well on room air.  Completed 3-day course of remdesivir.  Isolation on 06/29/2020.  Hypokalemia Potassium 4.1 --Repeat potassium and magnesium level in a.m.  GERD: Continue PPI  Weakness/deconditioning/debility: Patient being followed by PT and OT.  Continues with difficulty mobilizing from a sitting to standing position with gait disturbance with ability to ambulate roughly 5 feet today with OT.  Discussed with patient's daughter, given his severe deconditioning unable to support at home and elected for SNF placement. --Continue PT/OT efforts while inpatient --TOC for SNF placement   DVT prophylaxis: Eliquis   Code Status: DNR Family Communication: No family present at bedside this morning  Disposition Plan:  Level of care: Telemetry Status is: Inpatient  Remains inpatient appropriate because:Ongoing diagnostic testing needed not appropriate for outpatient work up, Unsafe d/c plan, IV treatments appropriate due to intensity of illness or inability to take PO and Inpatient level of care appropriate due to severity of illness   Dispo: The patient is from: Home              Anticipated d/c is to: SNF              Patient currently is medically stable to d/c.   Difficult to place patient No   Consultants:   None  Procedures:   TTE  Antimicrobials:   None   Subjective: Patient  seen and examined at bedside, resting comfortably.  Eating breakfast.  No specific complaints.  No family present.  Patient denies headache, no fever/chills/night sweats, no nausea/vomiting/diarrhea, no chest pain, palpitations, no shortness of breath, no abdominal pain.  No acute events overnight per nursing staff.  Pending  SNF placement.  Objective: Vitals:   06/24/20 1318 06/24/20 2024 06/25/20 0331 06/25/20 1147  BP: 96/64 105/68 117/79 92/68  Pulse: 79 82 93 74  Resp: 20 20 20 16   Temp: 97.9 F (36.6 C) 98 F (36.7 C) 97.8 F (36.6 C) 97.7 F (36.5 C)  TempSrc: Oral Oral Oral Oral  SpO2: 97% 100% 95% 96%  Weight:  77.6 kg    Height:        Intake/Output Summary (Last 24 hours) at 06/25/2020 1244 Last data filed at 06/25/2020 06/27/2020 Gross per 24 hour  Intake 80 ml  Output 1025 ml  Net -945 ml   Filed Weights   06/23/20 0500 06/23/20 2210 06/24/20 2024  Weight: 74 kg 74.3 kg 77.6 kg    Examination:  General exam: Appears calm and comfortable, elderly in appearance Respiratory system: Clear to auscultation. Respiratory effort normal.  Oxygenating well on room air Cardiovascular system: S1 & S2 heard, RRR. No JVD, murmurs, rubs, gallops or clicks. No pedal edema. Gastrointestinal system: Abdomen is nondistended, soft and nontender. No organomegaly or masses felt. Normal bowel sounds heard. Central nervous system: Alert and oriented. No focal neurological deficits. Extremities: Symmetric 5 x 5 power. Skin: No rashes, lesions or ulcers Psychiatry: Judgement and insight appear poor. Mood & affect appropriate.     Data Reviewed: I have personally reviewed following labs and imaging studies  CBC: Recent Labs  Lab 06/19/20 1715 06/20/20 0803  WBC 6.1 7.2  NEUTROABS 3.9  --   HGB 12.0* 12.3*  HCT 38.9* 39.2  MCV 98.5 95.6  PLT 164 163   Basic Metabolic Panel: Recent Labs  Lab 06/19/20 1908 06/20/20 0803 06/22/20 0446 06/23/20 0431 06/24/20 0425 06/25/20 0437  NA  --  144 144 142 141 139  K  --  3.3* 3.8 3.2* 4.1 4.1  CL  --  106 102 99 100 100  CO2  --  28 26 30 29 28   GLUCOSE  --  92 88 89 84 94  BUN  --  16 22 24* 31* 30*  CREATININE  --  1.14 1.51* 1.36* 1.57* 1.39*  CALCIUM  --  9.1 9.4 9.3 9.2 9.4  MG 2.1  --  1.9 2.5* 2.3  --    GFR: Estimated Creatinine  Clearance: 32.4 mL/min (A) (by C-G formula based on SCr of 1.39 mg/dL (H)). Liver Function Tests: No results for input(s): AST, ALT, ALKPHOS, BILITOT, PROT, ALBUMIN in the last 168 hours. No results for input(s): LIPASE, AMYLASE in the last 168 hours. No results for input(s): AMMONIA in the last 168 hours. Coagulation Profile: Recent Labs  Lab 06/19/20 1715  INR 1.1   Cardiac Enzymes: No results for input(s): CKTOTAL, CKMB, CKMBINDEX, TROPONINI in the last 168 hours. BNP (last 3 results) No results for input(s): PROBNP in the last 8760 hours. HbA1C: No results for input(s): HGBA1C in the last 72 hours. CBG: Recent Labs  Lab 06/19/20 2320  GLUCAP 106*   Lipid Profile: No results for input(s): CHOL, HDL, LDLCALC, TRIG, CHOLHDL, LDLDIRECT in the last 72 hours. Thyroid Function Tests: No results for input(s): TSH, T4TOTAL, FREET4, T3FREE, THYROIDAB in the last 72 hours. Anemia Panel: No results for input(s):  VITAMINB12, FOLATE, FERRITIN, TIBC, IRON, RETICCTPCT in the last 72 hours. Sepsis Labs: No results for input(s): PROCALCITON, LATICACIDVEN in the last 168 hours.  Recent Results (from the past 240 hour(s))  SARS CORONAVIRUS 2 (TAT 6-24 HRS) Nasopharyngeal Nasopharyngeal Swab     Status: Abnormal   Collection Time: 06/19/20  7:08 PM   Specimen: Nasopharyngeal Swab  Result Value Ref Range Status   SARS Coronavirus 2 POSITIVE (A) NEGATIVE Final    Comment: (NOTE) SARS-CoV-2 target nucleic acids are DETECTED.  The SARS-CoV-2 RNA is generally detectable in upper and lower respiratory specimens during the acute phase of infection. Positive results are indicative of the presence of SARS-CoV-2 RNA. Clinical correlation with patient history and other diagnostic information is  necessary to determine patient infection status. Positive results do not rule out bacterial infection or co-infection with other viruses.  The expected result is Negative.  Fact Sheet for  Patients: HairSlick.no  Fact Sheet for Healthcare Providers: quierodirigir.com  This test is not yet approved or cleared by the [place] FDA and  has been authorized for detection and/or diagnosis of SARS-CoV-2 by FDA under an Emergency Use Authorization (EUA). This EUA will remain  in effect (meaning this test can be used) for the duration of the COVID-19 declaration under Section 564(b)(1) of the Act, 21 U. S.C. section 360bbb-3(b)(1), unless the authorization is terminated or revoked sooner.   Performed at [facility], [address], [place], [place] [address]          Radiology Studies: No results found.      Scheduled Meds: . apixaban  5 mg Oral BID  . diclofenac Sodium  4 g Topical QID  . metoprolol tartrate  12.5 mg Oral BID  . multivitamin with minerals  1 tablet Oral Daily  . pantoprazole  40 mg Oral Daily  . sodium chloride flush  3 mL Intravenous Q12H   Continuous Infusions:    LOS: 5 days    Time spent: 35 minutes spent on chart review, discussion with nursing staff, consultants, updating family and interview/physical exam; more than 50% of that time was spent in counseling and/or coordination of care.    Alvira Philips [place], DO [facility] Available via Epic secure chat 7am-7pm After these hours, please refer to coverage provider listed on [email or web address] 06/25/2020, 12:44 PM

## 2020-06-25 NOTE — Plan of Care (Signed)
  Problem: Education: Goal: Knowledge of General Education information will improve Description: Including pain rating scale, medication(s)/side effects and non-pharmacologic comfort measures Outcome: Progressing   Problem: Health Behavior/Discharge Planning: Goal: Ability to manage health-related needs will improve Outcome: Progressing   Problem: Clinical Measurements: Goal: Ability to maintain clinical measurements within normal limits will improve Outcome: Progressing Goal: Will remain free from infection Outcome: Progressing Goal: Diagnostic test results will improve Outcome: Progressing Goal: Respiratory complications will improve Outcome: Progressing Goal: Cardiovascular complication will be avoided Outcome: Progressing   Problem: Activity: Goal: Risk for activity intolerance will decrease Outcome: Progressing   Problem: Nutrition: Goal: Adequate nutrition will be maintained Outcome: Progressing   Problem: Coping: Goal: Level of anxiety will decrease Outcome: Progressing   Problem: Elimination: Goal: Will not experience complications related to bowel motility Outcome: Progressing Goal: Will not experience complications related to urinary retention Outcome: Progressing   Problem: Pain Managment: Goal: General experience of comfort will improve Outcome: Progressing   Problem: Safety: Goal: Ability to remain free from injury will improve Outcome: Progressing   Problem: Skin Integrity: Goal: Risk for impaired skin integrity will decrease Outcome: Progressing   Problem: Education: Goal: Knowledge of disease or condition will improve Outcome: Progressing Goal: Understanding of medication regimen will improve Outcome: Progressing Goal: Individualized Educational Video(s) Outcome: Progressing   Problem: Activity: Goal: Ability to tolerate increased activity will improve Outcome: Progressing   Problem: Cardiac: Goal: Ability to achieve and maintain  adequate cardiopulmonary perfusion will improve Outcome: Progressing   Problem: Health Behavior/Discharge Planning: Goal: Ability to safely manage health-related needs after discharge will improve Outcome: Progressing   Problem: Safety: Goal: Non-violent Restraint(s) Outcome: Progressing   Problem: Education: Goal: Knowledge of risk factors and measures for prevention of condition will improve Outcome: Progressing   Problem: Coping: Goal: Psychosocial and spiritual needs will be supported Outcome: Progressing   Problem: Respiratory: Goal: Will maintain a patent airway Outcome: Progressing Goal: Complications related to the disease process, condition or treatment will be avoided or minimized Outcome: Progressing   

## 2020-06-26 LAB — BASIC METABOLIC PANEL
Anion gap: 10 (ref 5–15)
BUN: 28 mg/dL — ABNORMAL HIGH (ref 8–23)
CO2: 27 mmol/L (ref 22–32)
Calcium: 9.5 mg/dL (ref 8.9–10.3)
Chloride: 101 mmol/L (ref 98–111)
Creatinine, Ser: 1.41 mg/dL — ABNORMAL HIGH (ref 0.61–1.24)
GFR, Estimated: 45 mL/min — ABNORMAL LOW (ref >60)
Glucose, Bld: 88 mg/dL (ref 70–99)
Potassium: 4.4 mmol/L (ref 3.5–5.1)
Sodium: 138 mmol/L (ref 135–145)

## 2020-06-26 NOTE — Progress Notes (Signed)
PROGRESS NOTE    [name]  FAO:[id number] DOB: [birth year] DOA: 06/19/2020 PCP: [name], MD    Brief Narrative:  Cory Hansen is a 87 year old male with past medical history significant for bladder cancer, hyperlipidemia, low back pain, who presented to the ED with progressive shortness of breath and lower extremity edema.  Reports weight gain of about 20 pounds in the last few weeks.  Lower extremity edema present now 1-2 weeks.  Patient saw his PCP who found patient in atrial fibrillation, which is new and he was sent to the ED for further evaluation.  In the ED, sodium 145, potassium 4.2, chloride 110, CO2 22, glucose 95, BUN 21, creatinine 1.32.  BNP elevated 472.5.  Troponin 10>9.  WBC 6.1, hemoglobin 12.0, platelets 164.  Covid-19 test positive.  Chest x-ray with small pleural effusions, upper normal heart size.  EKG with A. fib, rate 100, no concerning ST elevation/depressions or T wave inversions.  Hospital service consulted for further evaluation and management of dyspnea and new onset atrial fibrillation.   Assessment & Plan:   Principal Problem:   Acute CHF (HCC) Active Problems:   AKI (acute kidney injury) (HCC)   New onset atrial fibrillation (HCC)   CHF (congestive heart failure) (HCC)   Paroxysmal atrial fibrillation Patient presenting to ED with shortness of breath, found to be in atrial fibrillation.  No previous history.  TSH within normal limits.  TTE with LVEF 55-60%, no LV wall motion abnormalities, normal IVC. --Metoprolol tartrate 12.5mg  PO BID --Started Eliquis 5 mg p.o. twice daily --Continue to monitor on telemetry  Acute diastolic congestive heart failure Patient presented with progressive shortness of breath, lower extremity edema.  Found to have vascular congestion, pleural effusions on chest x-ray with elevated BNP.  TTE with LVEF 55-60% with no regional wall motion abnormalities. --net positive past 24h and net negative 9.5L  since admission --wt 86.6>81.9>76.4>74.3>77.6>72.5kg --Continue beta-blocker as above --Strict I's and O's and daily weights --Repeat BMP in the a.m.  Covid-19 viral infection Covid-19 test positive on admission.  Vaccinated with Pfizer vaccine on 5/6 and 09/02/2019.  Oxygenating well on room air.  Completed 3-day course of remdesivir.  Isolation on 06/29/2020.  Hypokalemia Potassium 4.1 --Repeat potassium and magnesium level in a.m.  GERD: Continue PPI  Weakness/deconditioning/debility: Patient being followed by PT and OT.  Continues with difficulty mobilizing from a sitting to standing position with gait disturbance with ability to ambulate roughly 5 feet today with OT.  Discussed with patient's daughter, given his severe deconditioning unable to support at home and elected for SNF placement. --Continue PT/OT efforts while inpatient --TOC for SNF placement   DVT prophylaxis: Eliquis   Code Status: DNR Family Communication: Updated patient's daughter Harriett Sine present at bedside this morning.  Disposition Plan:  Level of care: Telemetry Status is: Inpatient  Remains inpatient appropriate because:Ongoing diagnostic testing needed not appropriate for outpatient work up, Unsafe d/c plan, IV treatments appropriate due to intensity of illness or inability to take PO and Inpatient level of care appropriate due to severity of illness   Dispo: The patient is from: Home              Anticipated d/c is to: SNF              Patient currently is medically stable to d/c.   Difficult to place patient No   Consultants:   None  Procedures:   TTE  Antimicrobials:   None  Subjective: Patient seen and examined at bedside, resting comfortably.  Eating breakfast.  No specific complaints.  Daughter present.  Patient denies headache, no fever/chills/night sweats, no nausea/vomiting/diarrhea, no chest pain, palpitations, no shortness of breath, no abdominal pain.  No acute events overnight  per nursing staff.  Pending SNF placement.  Objective: Vitals:   06/25/20 1147 06/25/20 2139 06/26/20 0358 06/26/20 1100  BP: 92/68 (!) 151/74 119/81   Pulse: 74 77 84   Resp: 16 (!) 21 20   Temp: 97.7 F (36.5 C) (!) 97.5 F (36.4 C) 98 F (36.7 C)   TempSrc: Oral  Oral   SpO2: 96% 95% 96%   Weight:    72.5 kg  Height:        Intake/Output Summary (Last 24 hours) at 06/26/2020 1209 Last data filed at 06/26/2020 0900 Gross per 24 hour  Intake 940 ml  Output 750 ml  Net 190 ml   Filed Weights   06/23/20 2210 06/24/20 2024 06/26/20 1100  Weight: 74.3 kg 77.6 kg 72.5 kg    Examination:  General exam: Appears calm and comfortable, elderly in appearance Respiratory system: Clear to auscultation. Respiratory effort normal.  Oxygenating well on room air Cardiovascular system: S1 & S2 heard, RRR. No JVD, murmurs, rubs, gallops or clicks. No pedal edema. Gastrointestinal system: Abdomen is nondistended, soft and nontender. No organomegaly or masses felt. Normal bowel sounds heard. Central nervous system: Alert. No focal neurological deficits. Extremities: Symmetric 5 x 5 power. Skin: No rashes, lesions or ulcers Psychiatry: Judgement and insight appear poor. Mood & affect appropriate.     Data Reviewed: I have personally reviewed following labs and imaging studies  CBC: Recent Labs  Lab 06/19/20 1715 06/20/20 0803  WBC 6.1 7.2  NEUTROABS 3.9  --   HGB 12.0* 12.3*  HCT 38.9* 39.2  MCV 98.5 95.6  PLT 164 163   Basic Metabolic Panel: Recent Labs  Lab 06/19/20 1908 06/20/20 0803 06/22/20 0446 06/23/20 0431 06/24/20 0425 06/25/20 0437 06/26/20 0446  NA  --    < > 144 142 141 139 138  K  --    < > 3.8 3.2* 4.1 4.1 4.4  CL  --    < > 102 99 100 100 101  CO2  --    < > 26 30 29 28 27   GLUCOSE  --    < > 88 89 84 94 88  BUN  --    < > 22 24* 31* 30* 28*  CREATININE  --    < > 1.51* 1.36* 1.57* 1.39* 1.41*  CALCIUM  --    < > 9.4 9.3 9.2 9.4 9.5  MG 2.1  --  1.9  2.5* 2.3  --   --    < > = values in this interval not displayed.   GFR: Estimated Creatinine Clearance: 30.7 mL/min (A) (by C-G formula based on SCr of 1.41 mg/dL (H)). Liver Function Tests: No results for input(s): AST, ALT, ALKPHOS, BILITOT, PROT, ALBUMIN in the last 168 hours. No results for input(s): LIPASE, AMYLASE in the last 168 hours. No results for input(s): AMMONIA in the last 168 hours. Coagulation Profile: Recent Labs  Lab 06/19/20 1715  INR 1.1   Cardiac Enzymes: No results for input(s): CKTOTAL, CKMB, CKMBINDEX, TROPONINI in the last 168 hours. BNP (last 3 results) No results for input(s): PROBNP in the last 8760 hours. HbA1C: No results for input(s): HGBA1C in the last 72 hours. CBG: Recent Labs  Lab  06/19/20 2320  GLUCAP 106*   Lipid Profile: No results for input(s): CHOL, HDL, LDLCALC, TRIG, CHOLHDL, LDLDIRECT in the last 72 hours. Thyroid Function Tests: No results for input(s): TSH, T4TOTAL, FREET4, T3FREE, THYROIDAB in the last 72 hours. Anemia Panel: No results for input(s): VITAMINB12, FOLATE, FERRITIN, TIBC, IRON, RETICCTPCT in the last 72 hours. Sepsis Labs: No results for input(s): PROCALCITON, LATICACIDVEN in the last 168 hours.  Recent Results (from the past 240 hour(s))  SARS CORONAVIRUS 2 (TAT 6-24 HRS) Nasopharyngeal Nasopharyngeal Swab     Status: Abnormal   Collection Time: 06/19/20  7:08 PM   Specimen: Nasopharyngeal Swab  Result Value Ref Range Status   SARS Coronavirus 2 POSITIVE (A) NEGATIVE Final    Comment: (NOTE) SARS-CoV-2 target nucleic acids are DETECTED.  The SARS-CoV-2 RNA is generally detectable in upper and lower respiratory specimens during the acute phase of infection. Positive results are indicative of the presence of SARS-CoV-2 RNA. Clinical correlation with patient history and other diagnostic information is  necessary to determine patient infection status. Positive results do not rule out bacterial infection or  co-infection with other viruses.  The expected result is Negative.  Fact Sheet for Patients: HairSlick.no  Fact Sheet for Healthcare Providers: quierodirigir.com  This test is not yet approved or cleared by the [place] FDA and  has been authorized for detection and/or diagnosis of SARS-CoV-2 by FDA under an Emergency Use Authorization (EUA). This EUA will remain  in effect (meaning this test can be used) for the duration of the COVID-19 declaration under Section 564(b)(1) of the Act, 21 U. S.C. section 360bbb-3(b)(1), unless the authorization is terminated or revoked sooner.   Performed at [facility], [address], [place], [place] [address]          Radiology Studies: No results found.      Scheduled Meds: . apixaban  5 mg Oral BID  . diclofenac Sodium  4 g Topical QID  . metoprolol tartrate  12.5 mg Oral BID  . multivitamin with minerals  1 tablet Oral Daily  . pantoprazole  40 mg Oral Daily  . sodium chloride flush  3 mL Intravenous Q12H   Continuous Infusions:    LOS: 6 days    Time spent: 35 minutes spent on chart review, discussion with nursing staff, consultants, updating family and interview/physical exam; more than 50% of that time was spent in counseling and/or coordination of care.    Alvira Philips [place], DO [facility] Available via Epic secure chat 7am-7pm After these hours, please refer to coverage provider listed on [email or web address] 06/26/2020, 12:09 PM

## 2020-06-26 NOTE — Progress Notes (Addendum)
Physical Therapy Treatment Patient Details Name: ZEIN HELBING MRN: [id number] DOB: Nov 20, 1920 Today's Date: 06/26/2020    History of Present Illness 87 yo male admitted with acute CHF, COVID(+). Hx of hearing loss, back pain, Afib, shingles, OA, L foot 1st ray amputaiton    PT Comments    Pt attempting EOB without assist.  Just woke up from nap and needed to void.  General bed mobility comments: self able to EOB requesting to use bathroom.  General transfer comment: mainly assist for decreased vision.  General Gait Details: mainly required assist due to low vision.  Assisted with amb to and from bathroom with mild c/o "aches" and "stiffness" throughout.slightly forward flex posture.  Assisted back to bed per pt request. Pt will need ST Rehab at SNF to address gait instability, weakness and balance.    Follow Up Recommendations  SNF     Equipment Recommendations  None recommended by PT    Recommendations for Other Services       Precautions / Restrictions Precautions Precautions: Fall Precaution Comments: decreased vision, R knee arthritis    Mobility  Bed Mobility Overal bed mobility: Modified Independent             General bed mobility comments: self able to EOB requesting to use bathroom    Transfers Overall transfer level: Needs assistance Equipment used: Rolling walker (2 wheeled) Transfers: Sit to/from [employer] Sit to Stand: Min assist;Min guard Stand pivot transfers: Min guard;Min assist       General transfer comment: mainly assist for decreased vision  Ambulation/Gait Ambulation/Gait assistance: Min guard;Min assist Gait Distance (Feet): 24 Feet (12 feet x 2 to and from bathroom) Assistive device: Rolling walker (2 wheeled) Gait Pattern/deviations: Step-to pattern;Decreased stride length;Trunk flexed Gait velocity: decreased   General Gait Details: mainly required assist due to low vision.  Assisted with amb to and from bathroom  with mild c/o "aches" and "stiffness" throughout.slightly forward flex posture   Stairs             Wheelchair Mobility    Modified Rankin (Stroke Patients Only)       Balance                                            Cognition Arousal/Alertness: Awake/alert Behavior During Therapy: WFL for tasks assessed/performed Overall Cognitive Status: Within Functional Limits for tasks assessed                                 General Comments: A x O x 4 pleasant      Exercises      General Comments        Pertinent Vitals/Pain Pain Assessment: Faces Faces Pain Scale: Hurts a little bit Pain Location: R knee with standing Pain Descriptors / Indicators: Grimacing;Discomfort Pain Intervention(s): Monitored during session    Home Living                      Prior Function            PT Goals (current goals can now be found in the care plan section) Progress towards PT goals: Progressing toward goals    Frequency    Min 3X/week      PT Plan Current plan remains appropriate  Co-evaluation              AM-PAC PT "6 Clicks" Mobility   Outcome Measure  Help needed turning from your back to your side while in a flat bed without using bedrails?: None Help needed moving from lying on your back to sitting on the side of a flat bed without using bedrails?: None Help needed moving to and from a bed to a chair (including a wheelchair)?: A Little Help needed standing up from a chair using your arms (e.g., wheelchair or bedside chair)?: A Little Help needed to walk in hospital room?: A Little Help needed climbing 3-5 steps with a railing? : A Lot 6 Click Score: 19    End of Session Equipment Utilized During Treatment: Gait belt Activity Tolerance: Patient tolerated treatment well Patient left: in bed;with call bell/phone within reach;with bed alarm set;with family/visitor present Nurse Communication: Mobility  status PT Visit Diagnosis: Muscle weakness (generalized) (M62.81);Other abnormalities of gait and mobility (R26.89)     Time: 1410-1435 PT Time Calculation (min) (ACUTE ONLY): 25 min  Charges:  $Gait Training: 8-22 mins $Therapeutic Activity: 8-22 mins                     Felecia Shelling  PTA Acute  Rehabilitation Services Pager      [phone] Office      [phone]

## 2020-06-26 NOTE — Care Management Important Message (Signed)
Important Message  Patient Details IM Letter mailed to Patient's daughter. Name: Cory Hansen MRN: [id number] Date of Birth: [birth year]   Medicare Important Message Given:  Yes     Caren Macadam 06/26/2020, 2:10 PM

## 2020-06-26 NOTE — TOC Progression Note (Addendum)
Transition of Care [facility]) - Progression Note    Patient Details  Name: Cory Hansen MRN: [id number] Date of Birth: 1920-09-23  Transition of Care [facility]) CM/SW Contact  Geni Bers, RN Phone Number: 06/26/2020, 12:32 PM  Clinical Narrative:    Bed offers given to pt's daughter [name] at pt's bedside. Waiting for pt's daughter to select a facility. [facility] authorization started. Ref [id number].    Expected Discharge Plan: Skilled Nursing Facility Barriers to Discharge: No Barriers Identified  Expected Discharge Plan and Services Expected Discharge Plan: Skilled Nursing Facility       Living arrangements for the past 2 months: Single Family Home                                       Social Determinants of Health (SDOH) Interventions    Readmission Risk Interventions No flowsheet data found.

## 2020-06-26 NOTE — TOC Progression Note (Signed)
Transition of Care [facility]) - Progression Note    Patient Details  Name: Cory Hansen MRN: [id number] Date of Birth: 1921/01/17  Transition of Care [facility]) CM/SW Contact  Geni Bers, RN Phone Number: 06/26/2020, 1:19 PM  Clinical Narrative:    Daughter selected [facility]. Waiting for insurance auth.    Expected Discharge Plan: Skilled Nursing Facility Barriers to Discharge: No Barriers Identified  Expected Discharge Plan and Services Expected Discharge Plan: Skilled Nursing Facility       Living arrangements for the past 2 months: Single Family Home                                       Social Determinants of Health (SDOH) Interventions    Readmission Risk Interventions No flowsheet data found.

## 2020-06-27 NOTE — TOC Progression Note (Signed)
Transition of Care [facility]) - Progression Note    Patient Details  Name: Cory Hansen MRN: [id number] Date of Birth: Sep 03, 1920  Transition of Care [facility]) CM/SW Contact  Geni Bers, RN Phone Number: 06/27/2020, 12:50 PM  Clinical Narrative:    Spoke with pt's daughter this AM concerning discharge plans. After speaking with Admission coordinator at [facility], facility is unable to take pt for 30 days related to pt being positive for COVID on 3/14. Spoke with pt's daughters who selected [facility].  Pt may go to [facility] after 10 full days after being positive for COVID. Will need to restart insurance authorization for Thursday.   Expected Discharge Plan: Skilled Nursing Facility Barriers to Discharge: No Barriers Identified  Expected Discharge Plan and Services Expected Discharge Plan: Skilled Nursing Facility       Living arrangements for the past 2 months: Single Family Home                                       Social Determinants of Health (SDOH) Interventions    Readmission Risk Interventions No flowsheet data found.

## 2020-06-27 NOTE — Plan of Care (Signed)
  Problem: Education: Goal: Knowledge of General Education information will improve Description: Including pain rating scale, medication(s)/side effects and non-pharmacologic comfort measures Outcome: Progressing   Problem: Health Behavior/Discharge Planning: Goal: Ability to manage health-related needs will improve Outcome: Progressing   Problem: Clinical Measurements: Goal: Ability to maintain clinical measurements within normal limits will improve Outcome: Progressing Goal: Will remain free from infection Outcome: Progressing Goal: Diagnostic test results will improve Outcome: Progressing Goal: Respiratory complications will improve Outcome: Progressing Goal: Cardiovascular complication will be avoided Outcome: Progressing   Problem: Activity: Goal: Risk for activity intolerance will decrease Outcome: Progressing   Problem: Nutrition: Goal: Adequate nutrition will be maintained Outcome: Progressing   Problem: Coping: Goal: Level of anxiety will decrease Outcome: Progressing   Problem: Elimination: Goal: Will not experience complications related to bowel motility Outcome: Progressing Goal: Will not experience complications related to urinary retention Outcome: Progressing   Problem: Pain Managment: Goal: General experience of comfort will improve Outcome: Progressing   Problem: Safety: Goal: Ability to remain free from injury will improve Outcome: Progressing   Problem: Skin Integrity: Goal: Risk for impaired skin integrity will decrease Outcome: Progressing   Problem: Education: Goal: Knowledge of disease or condition will improve Outcome: Progressing Goal: Understanding of medication regimen will improve Outcome: Progressing Goal: Individualized Educational Video(s) Outcome: Progressing   Problem: Activity: Goal: Ability to tolerate increased activity will improve Outcome: Progressing   Problem: Cardiac: Goal: Ability to achieve and maintain  adequate cardiopulmonary perfusion will improve Outcome: Progressing   Problem: Health Behavior/Discharge Planning: Goal: Ability to safely manage health-related needs after discharge will improve Outcome: Progressing   Problem: Safety: Goal: Non-violent Restraint(s) Outcome: Progressing   Problem: Education: Goal: Knowledge of risk factors and measures for prevention of condition will improve Outcome: Progressing   Problem: Coping: Goal: Psychosocial and spiritual needs will be supported Outcome: Progressing   Problem: Respiratory: Goal: Will maintain a patent airway Outcome: Progressing Goal: Complications related to the disease process, condition or treatment will be avoided or minimized Outcome: Progressing   

## 2020-06-27 NOTE — Progress Notes (Signed)
PROGRESS NOTE    COLIE FUGITT  ZOX:[id number] DOB: May 25, 1920 DOA: 06/19/2020 PCP: [name], MD    Brief Narrative:  Cory Hansen is a 87 year old male with past medical history significant for bladder cancer, hyperlipidemia, low back pain, who presented to the ED with progressive shortness of breath and lower extremity edema.  Reports weight gain of about 20 pounds in the last few weeks.  Lower extremity edema present now 1-2 weeks.  Patient saw his PCP who found patient in atrial fibrillation, which is new and he was sent to the ED for further evaluation.  In the ED, sodium 145, potassium 4.2, chloride 110, CO2 22, glucose 95, BUN 21, creatinine 1.32.  BNP elevated 472.5.  Troponin 10>9.  WBC 6.1, hemoglobin 12.0, platelets 164.  Covid-19 test positive.  Chest x-ray with small pleural effusions, upper normal heart size.  EKG with A. fib, rate 100, no concerning ST elevation/depressions or T wave inversions.  Hospital service consulted for further evaluation and management of dyspnea and new onset atrial fibrillation.   Assessment & Plan:   Principal Problem:   Acute CHF (HCC) Active Problems:   AKI (acute kidney injury) (HCC)   New onset atrial fibrillation (HCC)   CHF (congestive heart failure) (HCC)   Paroxysmal atrial fibrillation Patient presenting to ED with shortness of breath, found to be in atrial fibrillation.  No previous history.  TSH within normal limits.  TTE with LVEF 55-60%, no LV wall motion abnormalities, normal IVC. --Metoprolol tartrate 12.5mg  PO BID --Started Eliquis 5 mg p.o. twice daily  Acute diastolic congestive heart failure Patient presented with progressive shortness of breath, lower extremity edema.  Found to have vascular congestion, pleural effusions on chest x-ray with elevated BNP.  TTE with LVEF 55-60% with no regional wall motion abnormalities. --net positive past 24h and net negative 9.5L since admission --wt  86.6>81.9>76.4>74.3>77.6>72.5kg>no weight recorded today --Continue beta-blocker as above --Strict I's and O's and daily weights --Repeat BMP in the a.m.  Covid-19 viral infection Covid-19 test positive on admission.  Vaccinated with Pfizer vaccine on 5/6 and 09/02/2019.  Oxygenating well on room air.  Completed 3-day course of remdesivir.  Isolation on 06/29/2020.  Hypokalemia Potassium 4.1 --Repeat potassium and magnesium level in a.m.  GERD: Continue PPI  Weakness/deconditioning/debility: Patient being followed by PT and OT.  Continues with difficulty mobilizing from a sitting to standing position with gait disturbance with ability to ambulate roughly 5 feet today with OT.  Discussed with patient's daughter, given his severe deconditioning unable to support at home and elected for SNF placement. --Continue PT/OT efforts while inpatient --TOC for SNF placement, pending 10 day isolation period   DVT prophylaxis: Eliquis   Code Status: DNR Family Communication: Updated patient's daughter Harriett Sine present at bedside yesterday  Disposition Plan:  Level of care: Telemetry Status is: Inpatient  Remains inpatient appropriate because:Ongoing diagnostic testing needed not appropriate for outpatient work up, Unsafe d/c plan, IV treatments appropriate due to intensity of illness or inability to take PO and Inpatient level of care appropriate due to severity of illness   Dispo: The patient is from: Home              Anticipated d/c is to: SNF              Patient currently is medically stable to d/c.   Difficult to place patient No   Consultants:   None  Procedures:   TTE  Antimicrobials:   None  Subjective: Patient seen and examined at bedside, resting comfortably.  Eating breakfast.  No specific complaints.  No family present this morning.  Patient denies headache, no fever/chills/night sweats, no nausea/vomiting/diarrhea, no chest pain, palpitations, no shortness of breath, no  abdominal pain.  No acute events overnight per nursing staff.  Pending SNF placement after 10-day isolation..  Objective: Vitals:   06/26/20 1312 06/26/20 2138 06/27/20 0456 06/27/20 1152  BP: 118/85 127/68 126/86 111/74  Pulse: 92 84 82 79  Resp: 16 20 18    Temp: 97.6 F (36.4 C) 97.8 F (36.6 C) 97.8 F (36.6 C)   TempSrc: Oral     SpO2: 96% 97% 97% 98%  Weight:      Height:        Intake/Output Summary (Last 24 hours) at 06/27/2020 1241 Last data filed at 06/27/2020 0845 Gross per 24 hour  Intake 360 ml  Output 1350 ml  Net -990 ml   Filed Weights   06/23/20 2210 06/24/20 2024 06/26/20 1100  Weight: 74.3 kg 77.6 kg 72.5 kg    Examination:  General exam: Appears calm and comfortable, elderly in appearance Respiratory system: Clear to auscultation. Respiratory effort normal.  Oxygenating well on room air Cardiovascular system: S1 & S2 heard, RRR. No JVD, murmurs, rubs, gallops or clicks. No pedal edema. Gastrointestinal system: Abdomen is nondistended, soft and nontender. No organomegaly or masses felt. Normal bowel sounds heard. Central nervous system: Alert. No focal neurological deficits. Extremities: Symmetric 5 x 5 power. Skin: No rashes, lesions or ulcers Psychiatry: Judgement and insight appear poor. Mood & affect appropriate.     Data Reviewed: I have personally reviewed following labs and imaging studies  CBC: No results for input(s): WBC, NEUTROABS, HGB, HCT, MCV, PLT in the last 168 hours. Basic Metabolic Panel: Recent Labs  Lab 06/22/20 0446 06/23/20 0431 06/24/20 0425 06/25/20 0437 06/26/20 0446  NA 144 142 141 139 138  K 3.8 3.2* 4.1 4.1 4.4  CL 102 99 100 100 101  CO2 26 30 29 28 27   GLUCOSE 88 89 84 94 88  BUN 22 24* 31* 30* 28*  CREATININE 1.51* 1.36* 1.57* 1.39* 1.41*  CALCIUM 9.4 9.3 9.2 9.4 9.5  MG 1.9 2.5* 2.3  --   --    GFR: Estimated Creatinine Clearance: 30.7 mL/min (A) (by C-G formula based on SCr of 1.41 mg/dL (H)). Liver  Function Tests: No results for input(s): AST, ALT, ALKPHOS, BILITOT, PROT, ALBUMIN in the last 168 hours. No results for input(s): LIPASE, AMYLASE in the last 168 hours. No results for input(s): AMMONIA in the last 168 hours. Coagulation Profile: No results for input(s): INR, PROTIME in the last 168 hours. Cardiac Enzymes: No results for input(s): CKTOTAL, CKMB, CKMBINDEX, TROPONINI in the last 168 hours. BNP (last 3 results) No results for input(s): PROBNP in the last 8760 hours. HbA1C: No results for input(s): HGBA1C in the last 72 hours. CBG: No results for input(s): GLUCAP in the last 168 hours. Lipid Profile: No results for input(s): CHOL, HDL, LDLCALC, TRIG, CHOLHDL, LDLDIRECT in the last 72 hours. Thyroid Function Tests: No results for input(s): TSH, T4TOTAL, FREET4, T3FREE, THYROIDAB in the last 72 hours. Anemia Panel: No results for input(s): VITAMINB12, FOLATE, FERRITIN, TIBC, IRON, RETICCTPCT in the last 72 hours. Sepsis Labs: No results for input(s): PROCALCITON, LATICACIDVEN in the last 168 hours.  Recent Results (from the past 240 hour(s))  SARS CORONAVIRUS 2 (TAT 6-24 HRS) Nasopharyngeal Nasopharyngeal Swab     Status: Abnormal  Collection Time: 06/19/20  7:08 PM   Specimen: Nasopharyngeal Swab  Result Value Ref Range Status   SARS Coronavirus 2 POSITIVE (A) NEGATIVE Final    Comment: (NOTE) SARS-CoV-2 target nucleic acids are DETECTED.  The SARS-CoV-2 RNA is generally detectable in upper and lower respiratory specimens during the acute phase of infection. Positive results are indicative of the presence of SARS-CoV-2 RNA. Clinical correlation with patient history and other diagnostic information is  necessary to determine patient infection status. Positive results do not rule out bacterial infection or co-infection with other viruses.  The expected result is Negative.  Fact Sheet for Patients: HairSlick.no  Fact Sheet for  Healthcare Providers: quierodirigir.com  This test is not yet approved or cleared by the [place] FDA and  has been authorized for detection and/or diagnosis of SARS-CoV-2 by FDA under an Emergency Use Authorization (EUA). This EUA will remain  in effect (meaning this test can be used) for the duration of the COVID-19 declaration under Section 564(b)(1) of the Act, 21 U. S.C. section 360bbb-3(b)(1), unless the authorization is terminated or revoked sooner.   Performed at [facility], [address] [place], [place] [address]          Radiology Studies: No results found.      Scheduled Meds: . apixaban  5 mg Oral BID  . diclofenac Sodium  4 g Topical QID  . metoprolol tartrate  12.5 mg Oral BID  . multivitamin with minerals  1 tablet Oral Daily  . pantoprazole  40 mg Oral Daily  . sodium chloride flush  3 mL Intravenous Q12H   Continuous Infusions:    LOS: 7 days    Time spent: 35 minutes spent on chart review, discussion with nursing staff, consultants, updating family and interview/physical exam; more than 50% of that time was spent in counseling and/or coordination of care.    Alvira Philips [place], DO [facility] Available via Epic secure chat 7am-7pm After these hours, please refer to coverage provider listed on [email or web address] 06/27/2020, 12:41 PM

## 2020-06-28 LAB — BASIC METABOLIC PANEL
Anion gap: 9 (ref 5–15)
BUN: 32 mg/dL — ABNORMAL HIGH (ref 8–23)
CO2: 25 mmol/L (ref 22–32)
Calcium: 10 mg/dL (ref 8.9–10.3)
Chloride: 105 mmol/L (ref 98–111)
Creatinine, Ser: 1.26 mg/dL — ABNORMAL HIGH (ref 0.61–1.24)
GFR, Estimated: 52 mL/min — ABNORMAL LOW (ref >60)
Glucose, Bld: 96 mg/dL (ref 70–99)
Potassium: 5.2 mmol/L — ABNORMAL HIGH (ref 3.5–5.1)
Sodium: 139 mmol/L (ref 135–145)

## 2020-06-28 LAB — CBC
HCT: 46.9 % (ref 39.0–52.0)
Hemoglobin: 14.7 g/dL (ref 13.0–17.0)
MCH: 29.9 pg (ref 26.0–34.0)
MCHC: 31.3 g/dL (ref 30.0–36.0)
MCV: 95.3 fL (ref 80.0–100.0)
Platelets: 226 10*3/uL (ref 150–400)
RBC: 4.92 MIL/uL (ref 4.22–5.81)
RDW: 15.5 % (ref 11.5–15.5)
WBC: 8.6 10*3/uL (ref 4.0–10.5)
nRBC: 0 % (ref 0.0–0.2)

## 2020-06-28 LAB — MAGNESIUM: Magnesium: 2.7 mg/dL — ABNORMAL HIGH (ref 1.7–2.4)

## 2020-06-28 NOTE — TOC Progression Note (Signed)
Transition of Care [facility]) - Progression Note    Patient Details  Name: Cory Hansen MRN: [id number] Date of Birth: 1920-11-30  Transition of Care [facility]) CM/SW Contact  Geni Bers, RN Phone Number: 06/28/2020, 1:24 PM  Clinical Narrative:     Insurance auth for SNF started with [place],  Ref# [id number].  Expected Discharge Plan: Skilled Nursing Facility Barriers to Discharge: No Barriers Identified  Expected Discharge Plan and Services Expected Discharge Plan: Skilled Nursing Facility       Living arrangements for the past 2 months: Single Family Home                                       Social Determinants of Health (SDOH) Interventions    Readmission Risk Interventions No flowsheet data found.

## 2020-06-28 NOTE — Plan of Care (Signed)
  Problem: Health Behavior/Discharge Planning: Goal: Ability to manage health-related needs will improve Outcome: Progressing   Problem: Skin Integrity: Goal: Risk for impaired skin integrity will decrease Outcome: Progressing   Problem: Safety: Goal: Ability to remain free from injury will improve Outcome: Progressing   Problem: Activity: Goal: Ability to tolerate increased activity will improve Outcome: Progressing

## 2020-06-28 NOTE — Progress Notes (Signed)
Occupational Therapy Treatment Patient Details Name: Cory Hansen MRN: [id number] DOB: [birth year] Today's Date: 06/28/2020    History of present illness 87 yo male admitted with acute CHF, COVID(+). Hx of hearing loss, back pain, Afib, shingles, OA, L foot 1st ray amputaiton   OT comments  [name] presents alert and oriented to self, place and situation. With standing at sink reports weakness in his legs and in his back and unable to attempt to ambulate to bathroom. Patient able to stand and wash his face at sink with elbows propped on counter. Patient able to back up with rw with verbal cues and sit to recover. BP monitored due to patient's reports of weakness but took machine 3 attempts to get a reading. BP 113/74. Patient wanting to rest due to not resting well the night before. Patient supervision for transferring into supine and then scooting up in bed. Patient is progressing towards goals performing tasks out of bed. Continue to recommend short term rehab prior to return home.   Follow Up Recommendations  SNF    Equipment Recommendations  3 in 1 bedside commode    Recommendations for Other Services      Precautions / Restrictions Precautions Precautions: Fall Precaution Comments: decreased vision, R knee arthritis Restrictions Weight Bearing Restrictions: No       Mobility Bed Mobility   Bed Mobility: Sit to Supine     Supine to sit: Supervision     General bed mobility comments: supervision to transfer back into bed and to scoot himself up in bed.    Transfers Overall transfer level: Needs assistance Equipment used: Rolling walker (2 wheeled) Transfers: Sit to/from [employer] Sit to Stand: Min assist Stand pivot transfers: Min guard       General transfer comment: sit to stand with min assist from recliner. min guard to take steps to sink with recliner. With standing at sink patinet reports his legs feel weak and wobbly and unable  to attempt to walk to bathroom. Able to take steps backward to sit on edge of bed. Reports more weakness in RLE than LLE. BP taken (took 3 attempts with dynamap and BP reading 113/74. No complaints of dizziness.    Balance Overall balance assessment: Needs assistance Sitting-balance support: No upper extremity supported;Feet supported Sitting balance-Leahy Scale: Good     Standing balance support: Bilateral upper extremity supported;During functional activity Standing balance-Leahy Scale: Poor Standing balance comment: reliant on UE support                           ADL either performed or assessed with clinical judgement   ADL       Grooming: Standing;Wash/dry hands;Wash/dry face;Min guard Grooming Details (indicate cue type and reason): Patient standing at sink to wash face and hands with elbows propped on counter. REports his back and legs feel weak.                                     Vision Patient Visual Report: No change from baseline     Perception     Praxis      Cognition Arousal/Alertness: Awake/alert Behavior During Therapy: WFL for tasks assessed/performed Overall Cognitive Status: Within Functional Limits for tasks assessed  General Comments: A x O to self and situation. pleasant and a little hard of hearing.        Exercises     Shoulder Instructions       General Comments      Pertinent Vitals/ Pain       Pain Assessment: No/denies pain  Home Living                                          Prior Functioning/Environment              Frequency  Min 2X/week        Progress Toward Goals  OT Goals(current goals can now be found in the care plan section)  Progress towards OT goals: Progressing toward goals  Acute Rehab OT Goals Patient Stated Goal: get legs stronger OT Goal Formulation: With patient Potential to Achieve Goals: Good  Plan  Discharge plan remains appropriate    Co-evaluation                 AM-PAC OT "6 Clicks" Daily Activity     Outcome Measure   Help from another person eating meals?: A Little Help from another person taking care of personal grooming?: A Little Help from another person toileting, which includes using toliet, bedpan, or urinal?: A Little Help from another person bathing (including washing, rinsing, drying)?: A Little Help from another person to put on and taking off regular upper body clothing?: A Little Help from another person to put on and taking off regular lower body clothing?: A Lot 6 Click Score: 17    End of Session Equipment Utilized During Treatment: Gait belt;Rolling walker  OT Visit Diagnosis: Unsteadiness on feet (R26.81);Other abnormalities of gait and mobility (R26.89);Other symptoms and signs involving cognitive function   Activity Tolerance Patient tolerated treatment well   Patient Left in bed;with bed alarm set;with call bell/phone within reach   Nurse Communication Mobility status        Time: 9678-9381 OT Time Calculation (min): 15 min  Charges: OT General Charges $OT Visit: 1 Visit OT Treatments $Self Care/Home Management : 8-22 mins  Waldron Session, OTR/L Acute Care Rehab Services  Office [phone] Pager: [phone]    Kelli Churn 06/28/2020, 10:17 AM

## 2020-06-28 NOTE — Progress Notes (Signed)
Physical Therapy Treatment Patient Details Name: Cory Hansen MRN: [id number] DOB: May 02, 1920 Today's Date: 06/28/2020    History of Present Illness 87 yo male admitted with acute CHF, COVID(+). Hx of hearing loss, back pain, Afib, shingles, OA, L foot 1st ray amputaiton    PT Comments    General Comments: A x O to self and situation. pleasant and a little hard of hearing and low vision.  difficult for him to know if it is day or night (window is covered) Assisted OOB to amb to bathroom.  General bed mobility comments: supervision to transfer back into bed and to scoot himself up in bed. General transfer comment: 25% VC's on safety with turn completion and 50% VC's for direction due to low vision assisting to bathroom/toilet with white on white contrast.  No c/o of dizziness.  Feeling "weak".  "my legs are tired".  Assisted with peri care as pt stood holding grab bar present with mild instability.General Gait Details: mainly required assist due to low vision.  Assisted with amb to and from bathroom with mild c/o B LE weakness "my legs feel tired" Assisted back to bed per pt request.   Follow Up Recommendations  SNF     Equipment Recommendations  None recommended by PT    Recommendations for Other Services       Precautions / Restrictions Precautions Precautions: Fall Precaution Comments: decreased vision, R knee arthritis Restrictions Weight Bearing Restrictions: No    Mobility  Bed Mobility Overal bed mobility: Needs Assistance Bed Mobility: Supine to Sit;Sit to Supine     Supine to sit: Supervision Sit to supine: HOB elevated;Supervision   General bed mobility comments: supervision to transfer back into bed and to scoot himself up in bed.    Transfers Overall transfer level: Needs assistance Equipment used: Rolling walker (2 wheeled) Transfers: Sit to/from [employer] Sit to Stand: Min assist Stand pivot transfers: Min assist       General  transfer comment: 25% VC's on safety with turn completion and 50% VC's for direction due to low vision assisting to bathroom/toilet with white on white contrast.  No c/o of dizziness.  Feeling "weak".  "my legs are tired".  Assisted with peri care as pt stood holding grab bar present with mild instability.  Ambulation/Gait Ambulation/Gait assistance: Min guard;Min assist   Assistive device: Rolling walker (2 wheeled) Gait Pattern/deviations: Step-to pattern;Decreased stride length;Trunk flexed Gait velocity: decreased   General Gait Details: mainly required assist due to low vision.  Assisted with amb to and from bathroom with mild c/o B LE weakness "my legs feel tired"   Stairs             Wheelchair Mobility    Modified Rankin (Stroke Patients Only)       Balance Overall balance assessment: Needs assistance Sitting-balance support: No upper extremity supported;Feet supported Sitting balance-Leahy Scale: Good     Standing balance support: Bilateral upper extremity supported;During functional activity Standing balance-Leahy Scale: Poor Standing balance comment: reliant on UE support                            Cognition Arousal/Alertness: Awake/alert Behavior During Therapy: WFL for tasks assessed/performed Overall Cognitive Status: Within Functional Limits for tasks assessed                                 General Comments:  A x O to self and situation. pleasant and a little hard of hearing and low vision.  difficult for him to know if it is day or night (window is covered)      Exercises      General Comments        Pertinent Vitals/Pain Pain Assessment: No/denies pain    Home Living                      Prior Function            PT Goals (current goals can now be found in the care plan section) Acute Rehab PT Goals Patient Stated Goal: get legs stronger Progress towards PT goals: Progressing toward goals     Frequency    Min 2X/week      PT Plan Current plan remains appropriate    Co-evaluation              AM-PAC PT "6 Clicks" Mobility   Outcome Measure  Help needed turning from your back to your side while in a flat bed without using bedrails?: A Little Help needed moving from lying on your back to sitting on the side of a flat bed without using bedrails?: A Little Help needed moving to and from a bed to a chair (including a wheelchair)?: A Little Help needed standing up from a chair using your arms (e.g., wheelchair or bedside chair)?: A Little Help needed to walk in hospital room?: A Little Help needed climbing 3-5 steps with a railing? : A Lot 6 Click Score: 17    End of Session Equipment Utilized During Treatment: Gait belt Activity Tolerance: Patient tolerated treatment well Patient left: in bed;with call bell/phone within reach;with bed alarm set;with family/visitor present Nurse Communication: Mobility status PT Visit Diagnosis: Muscle weakness (generalized) (M62.81);Other abnormalities of gait and mobility (R26.89)     Time: 1030-1056 PT Time Calculation (min) (ACUTE ONLY): 26 min  Charges:  $Gait Training: 8-22 mins $Therapeutic Activity: 8-22 mins                     Felecia Shelling  PTA Acute  Rehabilitation Services Pager      [phone] Office      [phone]

## 2020-06-28 NOTE — Progress Notes (Signed)
PROGRESS NOTE    Cory Hansen  IWP:[id number] DOB: [birth year] DOA: 06/19/2020 PCP: [name], MD    Brief Narrative:  Cory Hansen is a 87 year old male with past medical history significant for bladder cancer, hyperlipidemia, low back pain, who presented to the ED with progressive shortness of breath and lower extremity edema.  Reports weight gain of about 20 pounds in the last few weeks.  Lower extremity edema present now 1-2 weeks.  Patient saw his PCP who found patient in atrial fibrillation, which is new and he was sent to the ED for further evaluation.  In the ED, sodium 145, potassium 4.2, chloride 110, CO2 22, glucose 95, BUN 21, creatinine 1.32.  BNP elevated 472.5.  Troponin 10>9.  WBC 6.1, hemoglobin 12.0, platelets 164.  Covid-19 test positive.  Chest x-ray with small pleural effusions, upper normal heart size.  EKG with A. fib, rate 100, no concerning ST elevation/depressions or T wave inversions.  Hospital service consulted for further evaluation and management of dyspnea and new onset atrial fibrillation.   Assessment & Plan:   Principal Problem:   Acute CHF (HCC) Active Problems:   AKI (acute kidney injury) (HCC)   New onset atrial fibrillation (HCC)   CHF (congestive heart failure) (HCC)   Paroxysmal atrial fibrillation Patient presenting to ED with shortness of breath, found to be in atrial fibrillation.  No previous history.  TSH within normal limits.  TTE with LVEF 55-60%, no LV wall motion abnormalities, normal IVC. --Metoprolol tartrate 12.5mg  PO BID --Continue Eliquis 5 mg p.o. twice daily  Acute diastolic congestive heart failure Patient presented with progressive shortness of breath, lower extremity edema.  Found to have vascular congestion, pleural effusions on chest x-ray with elevated BNP.  TTE with LVEF 55-60% with no regional wall motion abnormalities. --Continue beta-blocker as above --Strict I's and O's and daily weights --Repeat BMP in  the a.m.  Covid-19 viral infection Covid-19 test positive on admission.  Vaccinated with Pfizer vaccine on 5/6 and 09/02/2019.  Oxygenating well on room air.  Completed 3-day course of remdesivir.  Isolation on 06/29/2020.  Hypokalemia Potassium 5.2 today - hold supplementation  GERD: Continue PPI  Weakness/deconditioning/debility: Patient being followed by PT and OT.  Continues with difficulty mobilizing from a sitting to standing position with gait disturbance with ability to ambulate roughly 5 feet today with OT.  Discussed with patient's daughter, given his severe deconditioning unable to support at home and elected for SNF placement. --Continue PT/OT efforts while inpatient --TOC for SNF placement, pending 10 day isolation period   DVT prophylaxis: Eliquis   Code Status: DNR Family Communication: Updated patient's daughter Cory Hansen present at bedside yesterday  Disposition Plan:  Level of care: Telemetry Status is: Inpatient  Remains inpatient appropriate because:Ongoing diagnostic testing needed not appropriate for outpatient work up, Unsafe d/c plan, IV treatments appropriate due to intensity of illness or inability to take PO and Inpatient level of care appropriate due to severity of illness   Dispo: The patient is from: Home              Anticipated d/c is to: SNF              Patient currently is medically stable to d/c.   Difficult to place patient No   Consultants:   None  Procedures:   TTE  Antimicrobials:   None   Subjective: Patient seen and examined at bedside, resting comfortably.  Eating breakfast.  No specific complaints.  No family present this morning.  Patient denies headache, no fever/chills/night sweats, no nausea/vomiting/diarrhea, no chest pain, palpitations, no shortness of breath, no abdominal pain.  No acute events overnight per nursing staff.  Pending SNF placement after 10-day isolation..  Objective: Vitals:   06/27/20 2129 06/27/20 2131  06/28/20 0500 06/28/20 0647  BP: (!) 108/56 (!) 108/58  109/62  Pulse: 74 73  85  Resp:  20  19  Temp:  (!) 97.4 F (36.3 C)  98.7 F (37.1 C)  TempSrc:  Oral    SpO2:  97%  94%  Weight:   72.6 kg   Height:        Intake/Output Summary (Last 24 hours) at 06/28/2020 0745 Last data filed at 06/28/2020 0160 Gross per 24 hour  Intake 1174 ml  Output 1775 ml  Net -601 ml   Filed Weights   06/24/20 2024 06/26/20 1100 06/28/20 0500  Weight: 77.6 kg 72.5 kg 72.6 kg    Examination:  General exam: Appears calm and comfortable, elderly in appearance Respiratory system: Clear to auscultation. Respiratory effort normal.  Oxygenating well on room air Cardiovascular system: S1 & S2 heard, RRR. No JVD, murmurs, rubs, gallops or clicks. No pedal edema. Gastrointestinal system: Abdomen is nondistended, soft and nontender. No organomegaly or masses felt. Normal bowel sounds heard. Central nervous system: Alert. No focal neurological deficits. Extremities: Symmetric 5 x 5 power. Skin: No rashes, lesions or ulcers Psychiatry: Judgement and insight appear poor. Mood & affect appropriate.     Data Reviewed: I have personally reviewed following labs and imaging studies  CBC: Recent Labs  Lab 06/28/20 0432  WBC 8.6  HGB 14.7  HCT 46.9  MCV 95.3  PLT 226   Basic Metabolic Panel: Recent Labs  Lab 06/22/20 0446 06/23/20 0431 06/24/20 0425 06/25/20 0437 06/26/20 0446 06/28/20 0432  NA 144 142 141 139 138 139  K 3.8 3.2* 4.1 4.1 4.4 5.2*  CL 102 99 100 100 101 105  CO2 26 30 29 28 27 25   GLUCOSE 88 89 84 94 88 96  BUN 22 24* 31* 30* 28* 32*  CREATININE 1.51* 1.36* 1.57* 1.39* 1.41* 1.26*  CALCIUM 9.4 9.3 9.2 9.4 9.5 10.0  MG 1.9 2.5* 2.3  --   --  2.7*   GFR: Estimated Creatinine Clearance: 34.4 mL/min (A) (by C-G formula based on SCr of 1.26 mg/dL (H)). Liver Function Tests: No results for input(s): AST, ALT, ALKPHOS, BILITOT, PROT, ALBUMIN in the last 168 hours. No results  for input(s): LIPASE, AMYLASE in the last 168 hours. No results for input(s): AMMONIA in the last 168 hours. Coagulation Profile: No results for input(s): INR, PROTIME in the last 168 hours. Cardiac Enzymes: No results for input(s): CKTOTAL, CKMB, CKMBINDEX, TROPONINI in the last 168 hours. BNP (last 3 results) No results for input(s): PROBNP in the last 8760 hours. HbA1C: No results for input(s): HGBA1C in the last 72 hours. CBG: No results for input(s): GLUCAP in the last 168 hours. Lipid Profile: No results for input(s): CHOL, HDL, LDLCALC, TRIG, CHOLHDL, LDLDIRECT in the last 72 hours. Thyroid Function Tests: No results for input(s): TSH, T4TOTAL, FREET4, T3FREE, THYROIDAB in the last 72 hours. Anemia Panel: No results for input(s): VITAMINB12, FOLATE, FERRITIN, TIBC, IRON, RETICCTPCT in the last 72 hours. Sepsis Labs: No results for input(s): PROCALCITON, LATICACIDVEN in the last 168 hours.  Recent Results (from the past 240 hour(s))  SARS CORONAVIRUS 2 (TAT 6-24 HRS) Nasopharyngeal Nasopharyngeal Swab  Status: Abnormal   Collection Time: 06/19/20  7:08 PM   Specimen: Nasopharyngeal Swab  Result Value Ref Range Status   SARS Coronavirus 2 POSITIVE (A) NEGATIVE Final    Comment: (NOTE) SARS-CoV-2 target nucleic acids are DETECTED.  The SARS-CoV-2 RNA is generally detectable in upper and lower respiratory specimens during the acute phase of infection. Positive results are indicative of the presence of SARS-CoV-2 RNA. Clinical correlation with patient history and other diagnostic information is  necessary to determine patient infection status. Positive results do not rule out bacterial infection or co-infection with other viruses.  The expected result is Negative.  Fact Sheet for Patients: HairSlick.no  Fact Sheet for Healthcare Providers: quierodirigir.com  This test is not yet approved or cleared by the [place] FDA and  has been authorized for detection and/or diagnosis of SARS-CoV-2 by FDA under an Emergency Use Authorization (EUA). This EUA will remain  in effect (meaning this test can be used) for the duration of the COVID-19 declaration under Section 564(b)(1) of the Act, 21 U. S.C. section 360bbb-3(b)(1), unless the authorization is terminated or revoked sooner.   Performed at [facility], [address], [place], [place] [address]          Radiology Studies: No results found.      Scheduled Meds: . apixaban  5 mg Oral BID  . diclofenac Sodium  4 g Topical QID  . metoprolol tartrate  12.5 mg Oral BID  . multivitamin with minerals  1 tablet Oral Daily  . pantoprazole  40 mg Oral Daily  . sodium chloride flush  3 mL Intravenous Q12H   Continuous Infusions:    LOS: 8 days    Time spent: 35 minutes spent on chart review, discussion with nursing staff, consultants, updating family and interview/physical exam; more than 50% of that time was spent in counseling and/or coordination of care.    Azucena Fallen, DO [facility] Available via Epic secure chat 7am-7pm After these hours, please refer to coverage provider listed on [email or web address] 06/28/2020, 7:45 AM

## 2020-06-29 DIAGNOSIS — M5441 Lumbago with sciatica, right side: Secondary | ICD-10-CM | POA: Diagnosis not present

## 2020-06-29 DIAGNOSIS — R531 Weakness: Secondary | ICD-10-CM | POA: Diagnosis not present

## 2020-06-29 DIAGNOSIS — R279 Unspecified lack of coordination: Secondary | ICD-10-CM | POA: Diagnosis not present

## 2020-06-29 DIAGNOSIS — Z8616 Personal history of COVID-19: Secondary | ICD-10-CM | POA: Diagnosis not present

## 2020-06-29 DIAGNOSIS — I5031 Acute diastolic (congestive) heart failure: Secondary | ICD-10-CM | POA: Diagnosis not present

## 2020-06-29 DIAGNOSIS — I499 Cardiac arrhythmia, unspecified: Secondary | ICD-10-CM | POA: Diagnosis not present

## 2020-06-29 DIAGNOSIS — R404 Transient alteration of awareness: Secondary | ICD-10-CM | POA: Diagnosis not present

## 2020-06-29 DIAGNOSIS — K219 Gastro-esophageal reflux disease without esophagitis: Secondary | ICD-10-CM | POA: Diagnosis not present

## 2020-06-29 DIAGNOSIS — I5033 Acute on chronic diastolic (congestive) heart failure: Secondary | ICD-10-CM | POA: Diagnosis not present

## 2020-06-29 DIAGNOSIS — M2550 Pain in unspecified joint: Secondary | ICD-10-CM | POA: Diagnosis not present

## 2020-06-29 DIAGNOSIS — N179 Acute kidney failure, unspecified: Secondary | ICD-10-CM | POA: Diagnosis not present

## 2020-06-29 DIAGNOSIS — I4891 Unspecified atrial fibrillation: Secondary | ICD-10-CM | POA: Diagnosis not present

## 2020-06-29 DIAGNOSIS — Z7901 Long term (current) use of anticoagulants: Secondary | ICD-10-CM | POA: Diagnosis not present

## 2020-06-29 DIAGNOSIS — G4489 Other headache syndrome: Secondary | ICD-10-CM | POA: Diagnosis not present

## 2020-06-29 DIAGNOSIS — R6889 Other general symptoms and signs: Secondary | ICD-10-CM | POA: Diagnosis not present

## 2020-06-29 DIAGNOSIS — I480 Paroxysmal atrial fibrillation: Secondary | ICD-10-CM | POA: Diagnosis not present

## 2020-06-29 DIAGNOSIS — Z8551 Personal history of malignant neoplasm of bladder: Secondary | ICD-10-CM | POA: Diagnosis not present

## 2020-06-29 DIAGNOSIS — U099 Post covid-19 condition, unspecified: Secondary | ICD-10-CM | POA: Diagnosis not present

## 2020-06-29 DIAGNOSIS — E876 Hypokalemia: Secondary | ICD-10-CM | POA: Diagnosis not present

## 2020-06-29 DIAGNOSIS — Z7401 Bed confinement status: Secondary | ICD-10-CM | POA: Diagnosis not present

## 2020-06-29 DIAGNOSIS — M6281 Muscle weakness (generalized): Secondary | ICD-10-CM | POA: Diagnosis not present

## 2020-06-29 DIAGNOSIS — M129 Arthropathy, unspecified: Secondary | ICD-10-CM | POA: Diagnosis not present

## 2020-06-29 DIAGNOSIS — Z743 Need for continuous supervision: Secondary | ICD-10-CM | POA: Diagnosis not present

## 2020-06-29 DIAGNOSIS — R4182 Altered mental status, unspecified: Secondary | ICD-10-CM | POA: Diagnosis present

## 2020-06-29 DIAGNOSIS — W19XXXA Unspecified fall, initial encounter: Secondary | ICD-10-CM | POA: Diagnosis not present

## 2020-06-29 MED ORDER — APIXABAN 5 MG PO TABS: 5.0000 mg | ORAL_TABLET | Freq: Two times a day (BID) | ORAL | 0 refills | Status: DC

## 2020-06-29 MED ORDER — POLYETHYLENE GLYCOL 3350 17 G PO PACK: 17.0000 g | PACK | Freq: Every day | ORAL | 0 refills | Status: DC | PRN

## 2020-06-29 MED ORDER — METOPROLOL TARTRATE 25 MG PO TABS: 12.5000 mg | ORAL_TABLET | Freq: Two times a day (BID) | ORAL | 0 refills | Status: DC

## 2020-06-29 NOTE — TOC Progression Note (Signed)
Transition of Care [facility]) - Progression Note    Patient Details  Name: Cory Hansen MRN: [id number] Date of Birth: [birth year]  [facility] CM/SW Contact  Geni Bers, RN Phone Number: 06/29/2020, 9:47 AM  Clinical Narrative:    [facility] was call and ready for pt, asked for Discharge Summary. Daughter Harriett Sine was called to inform her that pt would transfer today. Harriett Sine asked that Nurse call her when PTAR arrive, so that she or one of her sister's will be at facility.    Expected Discharge Plan: Skilled Nursing Facility Barriers to Discharge: No Barriers Identified  Expected Discharge Plan and Services Expected Discharge Plan: Skilled Nursing Facility       Living arrangements for the past 2 months: Single Family Home Expected Discharge Date: 06/29/20                                     Social Determinants of Health (SDOH) Interventions    Readmission Risk Interventions No flowsheet data found.

## 2020-06-29 NOTE — Plan of Care (Signed)
  Problem: Clinical Measurements: Goal: Will remain free from infection Outcome: Progressing   Problem: Education: Goal: Knowledge of disease or condition will improve Outcome: Progressing    Problem: Activity: Goal: Ability to tolerate increased activity will improve Outcome: Progressing

## 2020-06-29 NOTE — Care Management Important Message (Signed)
Important Message  Patient Details IM Letter placed in Patients room. Name: Cory Hansen MRN: [id number] Date of Birth: November 30, 1920   Medicare Important Message Given:  Yes     Caren Macadam 06/29/2020, 11:36 AM

## 2020-06-29 NOTE — Progress Notes (Signed)
Patient discharging to [facility] - report called and given to shae, Charity fundraiser.  IV removed - WNL.  AVS placed in packet to be transported with patient.  Patient aware and agreeable - in NAD.  Daughter Harriett Sine called and made aware that he is on the way to [facility].

## 2020-06-29 NOTE — Discharge Summary (Signed)
Physician Discharge Summary  Cory Hansen RUE:[id number] DOB: [birth year] DOA: 06/19/2020  PCP: Cory Hansen, Cory Hansen  Admit date: 06/19/2020 Discharge date: 06/29/2020  Admitted From: Home Disposition:  SNF  Recommendations for Outpatient Follow-up:  1. Follow up with PCP in 1-2 weeks 2. Please obtain BMP/CBC in one week 3. Please follow up on the following pending results:  Discharge Condition:Stable  CODE STATUS:DNR  Diet recommendation: As tolerated  Brief/Interim Summary: Cory Hansen is a 87 year old male with past medical history significant for bladder cancer, hyperlipidemia, low back pain, who presented to the ED with progressive shortness of breath and lower extremity edema.  Reports weight gain of about 20 pounds in the last few weeks.  Lower extremity edema present now 1-2 weeks.  Patient saw his PCP who found patient in atrial fibrillation, which is new and he was sent to the ED for further evaluation - found to have symptomatic new onset atrial fibrillation and incidentally covid positive.  Patient admitted with acute symptomatic paroxysmal A. fib in the setting of acute diastolic congestive heart failure and COVID-19 viral infection.  Patient was never hypoxic, completed 3-day course of Remdesivir and isolation as of 06/29/2020.  Patient's A. fib is now rate controlled, on Eliquis and tolerating well.  Patient is euvolemic now I's and O's more appropriate without any respiratory symptoms or hypoxia.  PT recommending ongoing physical therapy and rehab at SNF given patient's profound amatory dysfunction and weakness.  Paroxysmal atrial fibrillation Patient presenting to ED with shortness of breath, found to be in atrial fibrillation.  No previous history.  TSH within normal limits.  TTE with LVEF 55-60%, no LV wall motion abnormalities, normal IVC. --Metoprolol tartrate 12.5mg  PO BID --Eliquis 5 mg p.o. twice daily  Acute diastolic congestive heart failure Echo with  LVEF 55-60% with no regional wall motion abnormalities. --Continue beta-blocker as above --Continue daily weights at home - follow up with cardiology/HF team as scheduled  Covid-19 viral infection Covid-19 test positive on admission.  Vaccinated with Pfizer vaccine on 5/6 and 09/02/2019.   Oxygenating well on room air.   Completed 3-day course of remdesivir.  Isolation completed 06/29/2020.  Hypokalemia Potassium 5.2 today - hold supplementation  GERD: Continue PPI  Weakness/deconditioning/debility: Patient being followed by PT and OT.  Continues with difficulty mobilizing from a sitting to standing position with gait disturbance with ability to ambulate roughly 5 feet today with OT.  Discussed with patient's daughter, given his severe deconditioning unable to support at home and elected for SNF placement. --Continue PT/OT at SNF per recs  Discharge Diagnoses:  Principal Problem:   Acute CHF (HCC) Active Problems:   AKI (acute kidney injury) (HCC)   New onset atrial fibrillation (HCC)   CHF (congestive heart failure) (HCC)    Discharge Instructions  Discharge Instructions    (HEART FAILURE PATIENTS) Call Hansen:  Anytime you have any of the following symptoms: 1) 3 pound weight gain in 24 hours or 5 pounds in 1 week 2) shortness of breath, with or without a dry hacking cough 3) swelling in the hands, feet or stomach 4) if you have to sleep on extra pillows at night in order to breathe.   Complete by: As directed    Call Hansen for:  temperature >100.4   Complete by: As directed    Diet - low sodium heart healthy   Complete by: As directed    Increase activity slowly   Complete by: As directed  Allergies as of 06/29/2020   No Known Allergies     Medication List    STOP taking these medications   ibuprofen 200 MG tablet Commonly known as: ADVIL     TAKE these medications   apixaban 5 MG Tabs tablet Commonly known as: ELIQUIS Take 1 tablet (5 mg total) by mouth 2  (two) times daily.   docusate sodium 100 MG capsule Commonly known as: COLACE Take 100 mg by mouth daily as needed for mild constipation.   fluticasone 50 MCG/ACT nasal spray Commonly known as: FLONASE Place 1 spray into both nostrils daily as needed for allergies.   glucosamine-chondroitin 500-400 MG tablet Take 1 tablet by mouth daily.   guaiFENesin-dextromethorphan 100-10 MG/5ML syrup Commonly known as: ROBITUSSIN DM Take 5 mLs by mouth every 4 (four) hours as needed for cough (chest congestion).   metoprolol tartrate 25 MG tablet Commonly known as: LOPRESSOR Take 0.5 tablets (12.5 mg total) by mouth 2 (two) times daily.   MULTIVITAMIN PO Take 1 each by mouth daily.   pantoprazole 40 MG tablet Commonly known as: PROTONIX Take 1 tablet (40 mg total) by mouth daily. What changed:   when to take this  reasons to take this   polyethylene glycol 17 g packet Commonly known as: MIRALAX / GLYCOLAX Take 17 g by mouth daily as needed for mild constipation.   vitamin B-12 1000 MCG tablet Commonly known as: CYANOCOBALAMIN Take 1 tablet (1,000 mcg total) by mouth daily.       Contact information for after-discharge care    Destination    [facility] Preferred SNF .   Service: Skilled Nursing Contact information: [address] [place] [address] [phone]                 No Known Allergies  Consultations:  None   Procedures/Studies: DG Chest 2 View  Result Date: 06/19/2020 CLINICAL DATA:  Shortness of breath. Bilateral lower extremity swelling. No onset atrial fibrillation. EXAM: CHEST - 2 VIEW COMPARISON:  Radiograph 08/28/2009 FINDINGS: Upper normal heart size. Normal mediastinal contours with aortic atherosclerosis. There are bilateral calcified pleural plaques, also seen on prior exam. Small pleural effusions with fluid in the fissures. No pulmonary edema. No pneumothorax. No acute osseous abnormalities are seen.  IMPRESSION: 1. Small pleural effusions with fluid in the fissures. 2. Upper normal heart size.  Aortic Atherosclerosis (ICD10-I70.0). 3. Chronic bilateral calcified pleural plaques. Electronically Signed   By: Narda Rutherford M.D.   On: 06/19/2020 16:56   ECHOCARDIOGRAM LIMITED  Result Date: 06/20/2020    ECHOCARDIOGRAM LIMITED REPORT   Patient Name:   EDREES VALENT Date of Exam: 06/20/2020 Medical Rec #:  [id number]      Height:       71.0 in Accession #:    [id number]     Weight:       179.7 lb Date of Birth:  Dec 16, 1920     BSA:          2.015 m Patient Age:    87 years       BP:           128/82 mmHg Patient Gender: M              HR:           102 bpm. Exam Location:  Inpatient Procedure: Limited Echo, Cardiac Doppler and Color Doppler Indications:    Dyspnea  History:        Patient has  no prior history of Echocardiogram examinations.                 Arrythmias:Atrial Fibrillation, Signs/Symptoms:Dyspnea, LE edema                 and Altered Mental Status; Risk Factors:Dyslipidemia. COVID+.  Sonographer:    Lavenia Atlas Referring Phys: [id number] Cecille Po MELVIN  Sonographer Comments: Suboptimal apical window. COVID+ IMPRESSIONS  1. Left ventricular ejection fraction, by estimation, is 55 to 60%. The left ventricle has normal function. The left ventricle has no regional wall motion abnormalities. Left ventricular diastolic function could not be evaluated.  2. Right ventricular systolic function is normal. The right ventricular size is normal. There is mildly elevated pulmonary artery systolic pressure. The estimated right ventricular systolic pressure is 44.7 mmHg.  3. Left atrial size was mildly dilated.  4. The mitral valve is normal in structure. Mild to moderate mitral valve regurgitation. No evidence of mitral stenosis.  5. Tricuspid valve regurgitation is moderate.  6. The aortic valve is normal in structure. Aortic valve regurgitation is mild. Mild aortic valve sclerosis is present, with no  evidence of aortic valve stenosis.  7. The inferior vena cava is normal in size with greater than 50% respiratory variability, suggesting right atrial pressure of 3 mmHg. FINDINGS  Left Ventricle: Left ventricular ejection fraction, by estimation, is 55 to 60%. The left ventricle has normal function. The left ventricle has no regional wall motion abnormalities. The left ventricular internal cavity size was normal in size. There is  no left ventricular hypertrophy. Left ventricular diastolic function could not be evaluated. Left ventricular diastolic function could not be evaluated due to atrial fibrillation. Right Ventricle: The right ventricular size is normal. No increase in right ventricular wall thickness. Right ventricular systolic function is normal. There is mildly elevated pulmonary artery systolic pressure. The tricuspid regurgitant velocity is 3.23  m/s, and with an assumed right atrial pressure of 3 mmHg, the estimated right ventricular systolic pressure is 44.7 mmHg. Left Atrium: Left atrial size was mildly dilated. Right Atrium: Right atrial size was normal in size. Pericardium: There is no evidence of pericardial effusion. Mitral Valve: The mitral valve is normal in structure. Mild mitral annular calcification. Mild to moderate mitral valve regurgitation. No evidence of mitral valve stenosis. Tricuspid Valve: The tricuspid valve is normal in structure. Tricuspid valve regurgitation is moderate . No evidence of tricuspid stenosis. Aortic Valve: The aortic valve is normal in structure. Aortic valve regurgitation is mild. Mild aortic valve sclerosis is present, with no evidence of aortic valve stenosis. Pulmonic Valve: The pulmonic valve was normal in structure. Pulmonic valve regurgitation is mild. No evidence of pulmonic stenosis. Aorta: The aortic root is normal in size and structure. Venous: The inferior vena cava is normal in size with greater than 50% respiratory variability, suggesting right atrial  pressure of 3 mmHg. IAS/Shunts: No atrial level shunt detected by color flow Doppler. LEFT VENTRICLE PLAX 2D LVIDd:         4.80 cm  Diastology LVIDs:         3.40 cm  LV e' medial:    5.44 cm/s LV PW:         1.20 cm  LV E/e' medial:  19.1 LV IVS:        1.30 cm  LV e' lateral:   7.29 cm/s LVOT diam:     2.40 cm  LV E/e' lateral: 14.3 LVOT Area:     4.52 cm  RIGHT VENTRICLE RV S prime:     11.90 cm/s LEFT ATRIUM         Index LA diam:    4.10 cm 2.04 cm/m   AORTA Ao Root diam: 3.30 cm MITRAL VALVE                TRICUSPID VALVE MV Area (PHT): 3.77 cm     TR Peak grad:   41.7 mmHg MV Decel Time: 201 msec     TR Vmax:        323.00 cm/s MV E velocity: 104.00 cm/s MV A velocity: 25.10 cm/s   SHUNTS MV E/A ratio:  4.14         Systemic Diam: 2.40 cm Mihai Croitoru Hansen Electronically signed by Thurmon Fair Hansen Signature Date/Time: 06/20/2020/3:50:25 PM    Final       Subjective: No acute issues or events overnight   Discharge Exam: Vitals:   06/28/20 2019 06/29/20 0516  BP: 104/66 118/87  Pulse: 72 79  Resp: 20 14  Temp: (!) 97.5 F (36.4 C) 97.6 F (36.4 C)  SpO2: 94% (!) 64%   Vitals:   06/28/20 2016 06/28/20 2019 06/29/20 0500 06/29/20 0516  BP: 104/66 104/66  118/87  Pulse: 63 72  79  Resp:  20  14  Temp:  (!) 97.5 F (36.4 C)  97.6 F (36.4 C)  TempSrc:  Oral  Oral  SpO2:  94%  (!) 64%  Weight:   72.6 kg   Height:        General: Pt is alert, awake, not in acute distress Cardiovascular: RRR, S1/S2 +, no rubs, no gallops Respiratory: CTA bilaterally, no wheezing, no rhonchi Abdominal: Soft, NT, ND, bowel sounds + Extremities: no edema, no cyanosis    The results of significant diagnostics from this hospitalization (including imaging, microbiology, ancillary and laboratory) are listed below for reference.     Microbiology: Recent Results (from the past 240 hour(s))  SARS CORONAVIRUS 2 (TAT 6-24 HRS) Nasopharyngeal Nasopharyngeal Swab     Status: Abnormal   Collection  Time: 06/19/20  7:08 PM   Specimen: Nasopharyngeal Swab  Result Value Ref Range Status   SARS Coronavirus 2 POSITIVE (A) NEGATIVE Final    Comment: (NOTE) SARS-CoV-2 target nucleic acids are DETECTED.  The SARS-CoV-2 RNA is generally detectable in upper and lower respiratory specimens during the acute phase of infection. Positive results are indicative of the presence of SARS-CoV-2 RNA. Clinical correlation with patient history and other diagnostic information is  necessary to determine patient infection status. Positive results do not rule out bacterial infection or co-infection with other viruses.  The expected result is Negative.  Fact Sheet for Patients: HairSlick.no  Fact Sheet for Healthcare Providers: quierodirigir.com  This test is not yet approved or cleared by the [place] FDA and  has been authorized for detection and/or diagnosis of SARS-CoV-2 by FDA under an Emergency Use Authorization (EUA). This EUA will remain  in effect (meaning this test can be used) for the duration of the COVID-19 declaration under Section 564(b)(1) of the Act, 21 U. S.C. section 360bbb-3(b)(1), unless the authorization is terminated or revoked sooner.   Performed at [facility] [place] [facility], [address], [place], [place] [address]      Labs: BNP (last 3 results) Recent Labs    06/19/20 1715  BNP 472.5*   Basic Metabolic Panel: Recent Labs  Lab 06/23/20 0431 06/24/20 0425 06/25/20 0437 06/26/20 0446 06/28/20 0432  NA 142 141  139 138 139  K 3.2* 4.1 4.1 4.4 5.2*  CL 99 100 100 101 105  CO2 GLUCOSE 89 84 94 88 96  BUN 24* 31* 30* 28* 32*  CREATININE 1.36* 1.57* 1.39* 1.41* 1.26*  CALCIUM 9.3 9.2 9.4 9.5 10.0  MG 2.5* 2.3  --   --  2.7*   Liver Function Tests: No results for input(s): AST, ALT, ALKPHOS, BILITOT, PROT, ALBUMIN in the last 168 hours. No results for input(s): LIPASE, AMYLASE in the last  168 hours. No results for input(s): AMMONIA in the last 168 hours. CBC: Recent Labs  Lab 06/28/20 0432  WBC 8.6  HGB 14.7  HCT 46.9  MCV 95.3  PLT 226   Cardiac Enzymes: No results for input(s): CKTOTAL, CKMB, CKMBINDEX, TROPONINI in the last 168 hours. BNP: Invalid input(s): POCBNP CBG: No results for input(s): GLUCAP in the last 168 hours. D-Dimer No results for input(s): DDIMER in the last 72 hours. Hgb A1c No results for input(s): HGBA1C in the last 72 hours. Lipid Profile No results for input(s): CHOL, HDL, LDLCALC, TRIG, CHOLHDL, LDLDIRECT in the last 72 hours. Thyroid function studies No results for input(s): TSH, T4TOTAL, T3FREE, THYROIDAB in the last 72 hours.  Invalid input(s): FREET3 Anemia work up No results for input(s): VITAMINB12, FOLATE, FERRITIN, TIBC, IRON, RETICCTPCT in the last 72 hours. Urinalysis    Component Value Date/Time   COLORURINE AMBER (A) 03/14/2020 2300   APPEARANCEUR HAZY (A) 03/14/2020 2300   LABSPEC 1.014 03/14/2020 2300   PHURINE 5.0 03/14/2020 2300   GLUCOSEU NEGATIVE 03/14/2020 2300   GLUCOSEU NEGATIVE 10/31/2017 1407   HGBUR MODERATE (A) 03/14/2020 2300   BILIRUBINUR NEGATIVE 03/14/2020 2300   KETONESUR NEGATIVE 03/14/2020 2300   PROTEINUR 30 (A) 03/14/2020 2300   UROBILINOGEN 0.2 10/31/2017 1407   NITRITE NEGATIVE 03/14/2020 2300   LEUKOCYTESUR NEGATIVE 03/14/2020 2300   Sepsis Labs Invalid input(s): PROCALCITONIN,  WBC,  LACTICIDVEN Microbiology Recent Results (from the past 240 hour(s))  SARS CORONAVIRUS 2 (TAT 6-24 HRS) Nasopharyngeal Nasopharyngeal Swab     Status: Abnormal   Collection Time: 06/19/20  7:08 PM   Specimen: Nasopharyngeal Swab  Result Value Ref Range Status   SARS Coronavirus 2 POSITIVE (A) NEGATIVE Final    Comment: (NOTE) SARS-CoV-2 target nucleic acids are DETECTED.  The SARS-CoV-2 RNA is generally detectable in upper and lower respiratory specimens during the acute phase of infection.  Positive results are indicative of the presence of SARS-CoV-2 RNA. Clinical correlation with patient history and other diagnostic information is  necessary to determine patient infection status. Positive results do not rule out bacterial infection or co-infection with other viruses.  The expected result is Negative.  Fact Sheet for Patients: HairSlick.no  Fact Sheet for Healthcare Providers: quierodirigir.com  This test is not yet approved or cleared by the [place] FDA and  has been authorized for detection and/or diagnosis of SARS-CoV-2 by FDA under an Emergency Use Authorization (EUA). This EUA will remain  in effect (meaning this test can be used) for the duration of the COVID-19 declaration under Section 564(b)(1) of the Act, 21 U. S.C. section 360bbb-3(b)(1), unless the authorization is terminated or revoked sooner.   Performed at [facility], [address], [place], [place] [address]      Time coordinating discharge: Over 30 minutes  SIGNED:   Azucena Fallen, DO [facility] 06/29/2020, 7:48 AM Pager   If 7PM-7AM, please contact night-coverage [email or web address]

## 2020-06-30 ENCOUNTER — Emergency Department (HOSPITAL_COMMUNITY): Payer: Medicare Other

## 2020-06-30 ENCOUNTER — Emergency Department (HOSPITAL_COMMUNITY)
Admission: EM | Admit: 2020-06-30 | Discharge: 2020-07-01 | Disposition: A | Discharge status: Home / Self Care | Payer: Medicare Other | Attending: Emergency Medicine | Admitting: Emergency Medicine

## 2020-06-30 ENCOUNTER — Other Ambulatory Visit: Payer: Self-pay

## 2020-06-30 ENCOUNTER — Encounter (HOSPITAL_COMMUNITY): Payer: Self-pay

## 2020-06-30 DIAGNOSIS — Z8551 Personal history of malignant neoplasm of bladder: Secondary | ICD-10-CM | POA: Insufficient documentation

## 2020-06-30 DIAGNOSIS — I5031 Acute diastolic (congestive) heart failure: Secondary | ICD-10-CM | POA: Diagnosis not present

## 2020-06-30 DIAGNOSIS — Z7901 Long term (current) use of anticoagulants: Secondary | ICD-10-CM | POA: Insufficient documentation

## 2020-06-30 DIAGNOSIS — I4891 Unspecified atrial fibrillation: Secondary | ICD-10-CM | POA: Diagnosis not present

## 2020-06-30 DIAGNOSIS — G4489 Other headache syndrome: Secondary | ICD-10-CM | POA: Diagnosis not present

## 2020-06-30 DIAGNOSIS — R4182 Altered mental status, unspecified: Secondary | ICD-10-CM | POA: Diagnosis present

## 2020-06-30 DIAGNOSIS — R404 Transient alteration of awareness: Secondary | ICD-10-CM | POA: Diagnosis not present

## 2020-06-30 DIAGNOSIS — Z743 Need for continuous supervision: Secondary | ICD-10-CM | POA: Diagnosis not present

## 2020-06-30 DIAGNOSIS — I499 Cardiac arrhythmia, unspecified: Secondary | ICD-10-CM | POA: Diagnosis not present

## 2020-06-30 LAB — CBC
HCT: 46.1 % (ref 39.0–52.0)
Hemoglobin: 14.3 g/dL (ref 13.0–17.0)
MCH: 30.2 pg (ref 26.0–34.0)
MCHC: 31 g/dL (ref 30.0–36.0)
MCV: 97.5 fL (ref 80.0–100.0)
Platelets: 230 10*3/uL (ref 150–400)
RBC: 4.73 MIL/uL (ref 4.22–5.81)
RDW: 15.6 % — ABNORMAL HIGH (ref 11.5–15.5)
WBC: 7.1 10*3/uL (ref 4.0–10.5)
nRBC: 0 % (ref 0.0–0.2)

## 2020-06-30 LAB — CBG MONITORING, ED: Glucose-Capillary: 91 mg/dL (ref 70–99)

## 2020-06-30 NOTE — ED Provider Notes (Signed)
[facility]-EMERGENCY DEPT Provider Note   CSN: [id number] Arrival date & time: 06/30/20  1943     History Chief Complaint  Patient presents with  . Altered Mental Status    Cory Hansen is a 87 y.o. male.  HPI     87 year old male with a history of bladder cancer, hyperlipidemia, CHF, atrial fibrillation, low back pain, admission March 14 to March 24 for symptomatic new onset atrial fibrillation, incidentally Covid positive, and acute diastolic congestive heart failure who presents with concern for episode of being difficult to wake today at his SNF after discharge from the hospital yesterday. He was discharged from the hospital yesterday and went to rehab/skilled nursing facility given profound generalized weakness.  His daughter was visiting him today at the facility, and she noted that he was sleeping longer than he typically would.  Reports that he would previously sleep 3 to 4 hours, however today was sleeping 4-5 and she became concerned and attempted to wake him up.  Reports that she was unable to wake him he was not responding, and she called the nurse for evaluation who also was not able to wake him and given these concerns called EMS.  They were eventually able to get get him awake.  They did not see seizure-like activity.  Did not recent falls or trauma.  The daughter reports that he has appeared somewhat more confused today.  Denies focal, numbness, weakness, visual changes, speech problems.  He has not complained of chest pain, shortness of breath, cough or other concerns.  He reports he was just pretending to sleep as he was tired of them asking questions.   Past Medical History:  Diagnosis Date  . Arthritis   . Atrial fibrillation (HCC)   . Kidney stones   . Shingles     Patient Active Problem List   Diagnosis Date Noted  . CHF (congestive heart failure) (HCC) 06/20/2020  . New onset atrial fibrillation (HCC) 06/19/2020  . Acute CHF (HCC)  06/19/2020  . AKI (acute kidney injury) (HCC) 03/14/2020  . Hypokalemia 03/14/2020  . SOB (shortness of breath) 08/19/2019  . Acute right ankle pain 05/07/2019  . Diarrhea 10/27/2015  . Urinary dribbling 07/26/2015  . Routine general medical examination at a health care facility 10/13/2014  . CT, CHEST, ABNORMAL 01/23/2009  . Chronic bilateral low back pain with right-sided sciatica 01/17/2009  . NEOPLASM, MALIGNANT, BLADDER, HX OF 01/17/2009  . Hx of benign neoplasm of prostate 01/17/2009  . Hyperlipidemia 01/16/2009  . ARTHRITIS 01/16/2009  . Weakness 01/16/2009    Past Surgical History:  Procedure Laterality Date  . AMPUTATION Left 02/22/2014   Procedure: INCISION AND DRAINAGE LEFT FOREFOOT AMPUTATION FIRST RAY;  Surgeon: Toni Arthurs, MD;  Location: [facility] OR;  Service: Orthopedics;  Laterality: Left;  . APPENDECTOMY    . EYE SURGERY Bilateral    cataract surgery w/ lens implant  . HERNIA REPAIR     inguinal hernia repair       Family History  Problem Relation Age of Onset  . Heart disease Neg Hx        No heart disease in parents    Social History   Tobacco Use  . Smoking status: Never Smoker  . Smokeless tobacco: Never Used  Vaping Use  . Vaping Use: Never used  Substance Use Topics  . Alcohol use: Yes    Alcohol/week: 0.0 standard drinks    Comment: very rare  . Drug use: No  Home Medications Prior to Admission medications   Medication Sig Start Date End Date Taking? Authorizing Provider  apixaban (ELIQUIS) 5 MG TABS tablet Take 1 tablet (5 mg total) by mouth 2 (two) times daily. 06/29/20  Yes Azucena Fallen, MD  docusate sodium (COLACE) 100 MG capsule Take 100 mg by mouth daily as needed for mild constipation.   Yes [provider]  fluticasone (FLONASE) 50 MCG/ACT nasal spray Place 1 spray into both nostrils daily as needed for allergies.   Yes [provider]  glucosamine-chondroitin 500-400 MG tablet Take 1 tablet by mouth in the  morning and at bedtime.   Yes [provider]  guaiFENesin-dextromethorphan (ROBITUSSIN DM) 100-10 MG/5ML syrup Take 5 mLs by mouth every 4 (four) hours as needed for cough (chest congestion). 03/20/20  Yes Drema Dallas, MD  metoprolol tartrate (LOPRESSOR) 25 MG tablet Take 0.5 tablets (12.5 mg total) by mouth 2 (two) times daily. Patient taking differently: Take 25 mg by mouth 2 (two) times daily. 06/29/20  Yes Azucena Fallen, MD  Multiple Vitamin (MULTIVITAMIN PO) Take 1 each by mouth daily.   Yes [provider]  pantoprazole (PROTONIX) 40 MG tablet Take 1 tablet (40 mg total) by mouth daily. Patient taking differently: Take 40 mg by mouth 2 (two) times daily. 04/18/20  Yes [name], MD  polyethylene glycol (MIRALAX / GLYCOLAX) 17 g packet Take 17 g by mouth daily as needed for mild constipation. 06/29/20  Yes Azucena Fallen, MD  vitamin B-12 (CYANOCOBALAMIN) 1000 MCG tablet Take 1 tablet (1,000 mcg total) by mouth daily. 10/14/14  Yes [name], MD    Allergies    Patient has no known allergies.  Review of Systems   Review of Systems  Constitutional: Positive for fatigue. Negative for fever.  HENT: Negative for sore throat.   Eyes: Negative for visual disturbance.  Respiratory: Negative for shortness of breath.   Cardiovascular: Negative for chest pain.  Gastrointestinal: Negative for abdominal pain, nausea and vomiting.  Genitourinary: Negative for difficulty urinating.  Musculoskeletal: Negative for back pain and neck stiffness.  Skin: Negative for rash.  Neurological: Negative for syncope and headaches.  Psychiatric/Behavioral: Positive for confusion.    Physical Exam Updated Vital Signs BP 114/69   Pulse 91   Temp 98.7 F (37.1 C) (Oral)   Resp 19   Ht 5\' 11"  (1.803 m)   Wt 72 kg   SpO2 97%   BMI 22.14 kg/m   Physical Exam Vitals and nursing note reviewed.  Constitutional:      General: He is not in acute  distress.    Appearance: He is well-developed. He is not diaphoretic.  HENT:     Head: Normocephalic and atraumatic.  Eyes:     Conjunctiva/sclera: Conjunctivae normal.  Cardiovascular:     Rate and Rhythm: Normal rate. Rhythm irregular.     Heart sounds: Normal heart sounds. No murmur heard. No friction rub. No gallop.   Pulmonary:     Effort: Pulmonary effort is normal. No respiratory distress.     Breath sounds: Normal breath sounds. No wheezing or rales.  Abdominal:     General: There is no distension.     Palpations: Abdomen is soft.     Tenderness: There is no abdominal tenderness. There is no guarding.  Musculoskeletal:     Cervical back: Normal range of motion.  Skin:    General: Skin is warm and dry.  Neurological:  Mental Status: He is alert.     GCS: GCS eye subscore is 4. GCS verbal subscore is 5. GCS motor subscore is 6.     Cranial Nerves: Cranial nerves are intact. No cranial nerve deficit, dysarthria or facial asymmetry.     Motor: No weakness or pronator drift.     Coordination: Finger-nose-finger test: legally blind, no noted coordination issues.     Comments: Oriented to self, location, "Thursday in March"      ED Results / Procedures / Treatments   Labs (all labs ordered are listed, but only abnormal results are displayed) Labs Reviewed  CBC - Abnormal; Notable for the following components:      Result Value   RDW 15.6 (*)    All other components within normal limits  COMPREHENSIVE METABOLIC PANEL  URINALYSIS, ROUTINE W REFLEX MICROSCOPIC  CBG MONITORING, ED    EKG EKG Interpretation  Date/Time:  Friday June 30 2020 22:30:36 EDT Ventricular Rate:  93 PR Interval:    QRS Duration: 94 QT Interval:  389 QTC Calculation: 444 R Axis:   56 Text Interpretation: Atrial fibrillation No significant change since last tracing Confirmed by Alvira MondaySchlossman, Erin ([id number]) on 06/30/2020 10:40:36 PM   Radiology CT Head Wo Contrast  Result Date:  06/30/2020 CLINICAL DATA:  Altered mental status. EXAM: CT HEAD WITHOUT CONTRAST TECHNIQUE: Contiguous axial images were obtained from the base of the skull through the vertex without intravenous contrast. COMPARISON:  Head CT March 14, 2020 FINDINGS: Brain: No evidence of acute large vascular territory infarction, hemorrhage, hydrocephalus, extra-axial collection or mass lesion/mass effect. Age related global parenchymal atrophy. Similar mild burden of chronic small vessel white matter ischemic disease. Vascular: No hyperdense vessel. Atherosclerotic calcifications of the intracranial portions of the internal carotid and vertebral arteries. Skull: Normal. Negative for fracture or focal lesion. Sinuses/Orbits: The paranasal sinuses and mastoid air cells are predominantly clear. Other: None IMPRESSION: 1. No acute intracranial findings. 2. Similar age related global parenchymal atrophy and chronic small vessel white matter ischemic disease. Electronically Signed   By: Maudry MayhewJeffrey  Waltz MD   On: 06/30/2020 22:16   DG Chest Portable 1 View  Result Date: 06/30/2020 CLINICAL DATA:  Altered mental status EXAM: PORTABLE CHEST 1 VIEW COMPARISON:  06/19/2020 FINDINGS: Cardiac shadow is mildly enlarged but stable. Aortic calcifications are again seen. Calcified pleural plaques are noted bilaterally and stable. No focal infiltrate or sizable effusion is seen. No bony abnormality is noted. IMPRESSION: Chronic calcified pleural plaques. No acute abnormality seen. Electronically Signed   By: Alcide CleverMark  Lukens M.D.   On: 06/30/2020 22:45    Procedures Procedures   Medications Ordered in ED Medications - No data to display  ED Course  I have reviewed the triage vital signs and the nursing notes.  Pertinent labs & imaging results that were available during my care of the patient were reviewed by me and considered in my medical decision making (see chart for details).    MDM Rules/Calculators/A&P                           87 year old male with a history of bladder cancer, hyperlipidemia, CHF, atrial fibrillation, low back pain, admission March 14 to March 24 for symptomatic new onset atrial fibrillation, incidentally Covid positive, and acute diastolic congestive heart failure who presents with concern for episode of being difficult to wake today at his SNF after discharge from the hospital yesterday.  No sign of anemia or  hypoglycemia.  EKG with atrial flutter, trigeminy noted on monitor.  CT head without acute abnormalities. Hemodynamically stable.  History does not seem consistent with seizure although difficult to exclude.  Do not suspect sepsis. History not consistent with TIA or CVA< no focal findings on exam.  CMP and UA pending at time of transfer of care. If no acute abnormalities suspect he may be discharged back to facility for further care.    Final Clinical Impression(s) / ED Diagnoses Final diagnoses:  Episode of altered consciousness    Rx / DC Orders ED Discharge Orders    None       Alvira Monday, MD 07/01/20 0045

## 2020-06-30 NOTE — ED Provider Notes (Signed)
Blood pressure 114/69, pulse 91, temperature 98.7 F (37.1 C), temperature source Oral, resp. rate 19, height 5\' 11"  (1.803 m), weight 72 kg, SpO2 97 %.  Assuming care from Dr. .  In short, Cory Hansen is a 87 y.o. male with a chief complaint of Altered Mental Status .  Refer to the original H&P for additional details.  The current plan of care is to follow up on CMP and UA.  Labs resulting no acute findings to explain symptoms. UA negative. CT head and CXR reviewed. Patient is back to baseline after he was difficult to wake this AM. Plan for discharge back to rehab. Patient and family in agreement.     94, MD 07/01/20 [phone]

## 2020-06-30 NOTE — ED Triage Notes (Signed)
Pt to ED by EMS from SNF with c/o AMS. Per facility pt had an episode of being non-responsive at approx 3pm today and then slept for 2 hours. Pt passed stroke screening by EMS and arrives at [facility], history of A-Fib & CHF.

## 2020-07-01 LAB — COMPREHENSIVE METABOLIC PANEL
ALT: 21 U/L (ref 0–44)
AST: 21 U/L (ref 15–41)
Albumin: 4 g/dL (ref 3.5–5.0)
Alkaline Phosphatase: 73 U/L (ref 38–126)
Anion gap: 8 (ref 5–15)
BUN: 29 mg/dL — ABNORMAL HIGH (ref 8–23)
CO2: 25 mmol/L (ref 22–32)
Calcium: 9.1 mg/dL (ref 8.9–10.3)
Chloride: 106 mmol/L (ref 98–111)
Creatinine, Ser: 1.23 mg/dL (ref 0.61–1.24)
GFR, Estimated: 53 mL/min — ABNORMAL LOW (ref >60)
Glucose, Bld: 103 mg/dL — ABNORMAL HIGH (ref 70–99)
Potassium: 4.9 mmol/L (ref 3.5–5.1)
Sodium: 139 mmol/L (ref 135–145)
Total Bilirubin: 1 mg/dL (ref 0.3–1.2)
Total Protein: 7 g/dL (ref 6.5–8.1)

## 2020-07-01 LAB — URINALYSIS, ROUTINE W REFLEX MICROSCOPIC
Bilirubin Urine: NEGATIVE
Glucose, UA: NEGATIVE mg/dL
Hgb urine dipstick: NEGATIVE
Ketones, ur: NEGATIVE mg/dL
Leukocytes,Ua: NEGATIVE
Nitrite: NEGATIVE
Protein, ur: NEGATIVE mg/dL
Specific Gravity, Urine: 1.019 (ref 1.005–1.030)
pH: 6 (ref 5.0–8.0)

## 2020-07-01 NOTE — Discharge Instructions (Signed)
You labs and other tests were normal. Please continue your rehab exercises and follow up with your PCP.

## 2020-07-10 DIAGNOSIS — N179 Acute kidney failure, unspecified: Secondary | ICD-10-CM | POA: Diagnosis not present

## 2020-07-10 DIAGNOSIS — I480 Paroxysmal atrial fibrillation: Secondary | ICD-10-CM | POA: Diagnosis not present

## 2020-07-10 DIAGNOSIS — I5031 Acute diastolic (congestive) heart failure: Secondary | ICD-10-CM | POA: Diagnosis not present

## 2020-07-10 DIAGNOSIS — M6281 Muscle weakness (generalized): Secondary | ICD-10-CM | POA: Diagnosis not present

## 2020-07-10 DIAGNOSIS — W19XXXA Unspecified fall, initial encounter: Secondary | ICD-10-CM | POA: Diagnosis not present

## 2020-07-10 DIAGNOSIS — Z7901 Long term (current) use of anticoagulants: Secondary | ICD-10-CM | POA: Diagnosis not present

## 2020-07-12 ENCOUNTER — Telehealth: Payer: Self-pay | Admitting: Internal Medicine

## 2020-07-12 NOTE — Telephone Encounter (Signed)
See below

## 2020-07-12 NOTE — Telephone Encounter (Signed)
Urban Gibson calling from [employer], he is wondering if we can put in a referral to them for the patient.  Tim- [phone] Okay to lvm

## 2020-07-13 DIAGNOSIS — M5441 Lumbago with sciatica, right side: Secondary | ICD-10-CM | POA: Diagnosis not present

## 2020-07-13 DIAGNOSIS — M6281 Muscle weakness (generalized): Secondary | ICD-10-CM | POA: Diagnosis not present

## 2020-07-13 DIAGNOSIS — K219 Gastro-esophageal reflux disease without esophagitis: Secondary | ICD-10-CM | POA: Diagnosis not present

## 2020-07-13 DIAGNOSIS — Z7901 Long term (current) use of anticoagulants: Secondary | ICD-10-CM | POA: Diagnosis not present

## 2020-07-13 DIAGNOSIS — I5031 Acute diastolic (congestive) heart failure: Secondary | ICD-10-CM | POA: Diagnosis not present

## 2020-07-13 DIAGNOSIS — N179 Acute kidney failure, unspecified: Secondary | ICD-10-CM | POA: Diagnosis not present

## 2020-07-13 DIAGNOSIS — I480 Paroxysmal atrial fibrillation: Secondary | ICD-10-CM | POA: Diagnosis not present

## 2020-07-13 NOTE — Telephone Encounter (Signed)
Ok for hospice referral.  

## 2020-07-21 DIAGNOSIS — I5033 Acute on chronic diastolic (congestive) heart failure: Secondary | ICD-10-CM | POA: Diagnosis not present

## 2020-07-21 DIAGNOSIS — I480 Paroxysmal atrial fibrillation: Secondary | ICD-10-CM | POA: Diagnosis not present

## 2020-07-21 DIAGNOSIS — Z8551 Personal history of malignant neoplasm of bladder: Secondary | ICD-10-CM | POA: Diagnosis not present

## 2020-07-21 DIAGNOSIS — M5441 Lumbago with sciatica, right side: Secondary | ICD-10-CM | POA: Diagnosis not present

## 2020-07-21 DIAGNOSIS — M129 Arthropathy, unspecified: Secondary | ICD-10-CM | POA: Diagnosis not present

## 2020-07-21 DIAGNOSIS — Z8616 Personal history of COVID-19: Secondary | ICD-10-CM | POA: Diagnosis not present

## 2020-07-24 ENCOUNTER — Telehealth: Payer: Self-pay | Admitting: Internal Medicine

## 2020-07-24 ENCOUNTER — Encounter: Payer: Self-pay | Admitting: Internal Medicine

## 2020-07-24 ENCOUNTER — Other Ambulatory Visit: Payer: Self-pay

## 2020-07-24 MED ORDER — METOPROLOL TARTRATE 25 MG PO TABS: 12.5000 mg | ORAL_TABLET | Freq: Two times a day (BID) | ORAL | 0 refills | Status: DC

## 2020-07-24 MED ORDER — APIXABAN 5 MG PO TABS: 5.0000 mg | ORAL_TABLET | Freq: Two times a day (BID) | ORAL | 1 refills | Status: DC

## 2020-07-24 NOTE — Telephone Encounter (Signed)
See below

## 2020-07-24 NOTE — Telephone Encounter (Signed)
Cory Hansen is requesting verbals for a nursing eval because of new medications and new diagnosis of atrial fibulation. Please advise    Okay to LVM: [phone]

## 2020-07-24 NOTE — Telephone Encounter (Signed)
Fine for verbals °

## 2020-07-24 NOTE — Telephone Encounter (Signed)
Sharyl Nimrod an occupational therapist with [facility] calling requesting verbal orders for [facility] OT 1 time a week for 4 weeks. Sharyl Nimrod- [phone] Okay to LVM

## 2020-07-25 ENCOUNTER — Telehealth: Payer: Self-pay | Admitting: Internal Medicine

## 2020-07-25 DIAGNOSIS — M129 Arthropathy, unspecified: Secondary | ICD-10-CM | POA: Diagnosis not present

## 2020-07-25 DIAGNOSIS — I480 Paroxysmal atrial fibrillation: Secondary | ICD-10-CM | POA: Diagnosis not present

## 2020-07-25 DIAGNOSIS — Z8551 Personal history of malignant neoplasm of bladder: Secondary | ICD-10-CM | POA: Diagnosis not present

## 2020-07-25 DIAGNOSIS — M5441 Lumbago with sciatica, right side: Secondary | ICD-10-CM | POA: Diagnosis not present

## 2020-07-25 DIAGNOSIS — I5033 Acute on chronic diastolic (congestive) heart failure: Secondary | ICD-10-CM | POA: Diagnosis not present

## 2020-07-25 DIAGNOSIS — Z8616 Personal history of COVID-19: Secondary | ICD-10-CM | POA: Diagnosis not present

## 2020-07-25 NOTE — Telephone Encounter (Signed)
  HH ORDERS   Caller Name: [facility] Agency Name: Magda Paganini Phone #: [phone] Service Requested: PT Frequency of Visits: 2W4, [phone]

## 2020-07-25 NOTE — Telephone Encounter (Signed)
Spoke with Cory Hansen from [facility] and she took the verbal orders for [facility]. No other questions or concerns at this time.

## 2020-07-25 NOTE — Telephone Encounter (Signed)
See below

## 2020-07-25 NOTE — Telephone Encounter (Signed)
Spoke with Sharyl Nimrod to give verbal orders. No questions or concerns at this time.

## 2020-07-26 DIAGNOSIS — Z8551 Personal history of malignant neoplasm of bladder: Secondary | ICD-10-CM | POA: Diagnosis not present

## 2020-07-26 DIAGNOSIS — M129 Arthropathy, unspecified: Secondary | ICD-10-CM | POA: Diagnosis not present

## 2020-07-26 DIAGNOSIS — Z8616 Personal history of COVID-19: Secondary | ICD-10-CM | POA: Diagnosis not present

## 2020-07-26 DIAGNOSIS — M5441 Lumbago with sciatica, right side: Secondary | ICD-10-CM | POA: Diagnosis not present

## 2020-07-26 DIAGNOSIS — I480 Paroxysmal atrial fibrillation: Secondary | ICD-10-CM | POA: Diagnosis not present

## 2020-07-26 DIAGNOSIS — I5033 Acute on chronic diastolic (congestive) heart failure: Secondary | ICD-10-CM | POA: Diagnosis not present

## 2020-07-26 NOTE — Telephone Encounter (Signed)
Fine

## 2020-07-26 NOTE — Telephone Encounter (Signed)
Spoke with Liji to give verbal orders.  

## 2020-07-28 DIAGNOSIS — M129 Arthropathy, unspecified: Secondary | ICD-10-CM | POA: Diagnosis not present

## 2020-07-28 DIAGNOSIS — M5441 Lumbago with sciatica, right side: Secondary | ICD-10-CM | POA: Diagnosis not present

## 2020-07-28 DIAGNOSIS — Z8551 Personal history of malignant neoplasm of bladder: Secondary | ICD-10-CM | POA: Diagnosis not present

## 2020-07-28 DIAGNOSIS — Z8616 Personal history of COVID-19: Secondary | ICD-10-CM | POA: Diagnosis not present

## 2020-07-28 DIAGNOSIS — I5033 Acute on chronic diastolic (congestive) heart failure: Secondary | ICD-10-CM | POA: Diagnosis not present

## 2020-07-28 DIAGNOSIS — I480 Paroxysmal atrial fibrillation: Secondary | ICD-10-CM | POA: Diagnosis not present

## 2020-07-31 DIAGNOSIS — M1711 Unilateral primary osteoarthritis, right knee: Secondary | ICD-10-CM | POA: Diagnosis not present

## 2020-07-31 DIAGNOSIS — M25561 Pain in right knee: Secondary | ICD-10-CM | POA: Diagnosis not present

## 2020-08-01 ENCOUNTER — Encounter: Payer: Self-pay | Admitting: Internal Medicine

## 2020-08-02 DIAGNOSIS — I480 Paroxysmal atrial fibrillation: Secondary | ICD-10-CM | POA: Diagnosis not present

## 2020-08-02 DIAGNOSIS — M129 Arthropathy, unspecified: Secondary | ICD-10-CM | POA: Diagnosis not present

## 2020-08-02 DIAGNOSIS — Z8616 Personal history of COVID-19: Secondary | ICD-10-CM | POA: Diagnosis not present

## 2020-08-02 DIAGNOSIS — I5033 Acute on chronic diastolic (congestive) heart failure: Secondary | ICD-10-CM | POA: Diagnosis not present

## 2020-08-02 DIAGNOSIS — M5441 Lumbago with sciatica, right side: Secondary | ICD-10-CM | POA: Diagnosis not present

## 2020-08-02 DIAGNOSIS — Z8551 Personal history of malignant neoplasm of bladder: Secondary | ICD-10-CM | POA: Diagnosis not present

## 2020-08-02 NOTE — Telephone Encounter (Signed)
    Cory Hansen from [place] calling to report patient has swelling in feet +2 right +1 left  Patient weight 169 on 4/25, 172lbs today  Daughter made aware of appointment 4/28

## 2020-08-03 ENCOUNTER — Other Ambulatory Visit: Payer: Self-pay

## 2020-08-03 ENCOUNTER — Encounter: Payer: Self-pay | Admitting: Internal Medicine

## 2020-08-03 ENCOUNTER — Ambulatory Visit (INDEPENDENT_AMBULATORY_CARE_PROVIDER_SITE_OTHER): Payer: Medicare Other | Admitting: Internal Medicine

## 2020-08-03 DIAGNOSIS — I4891 Unspecified atrial fibrillation: Secondary | ICD-10-CM | POA: Diagnosis not present

## 2020-08-03 DIAGNOSIS — G8929 Other chronic pain: Secondary | ICD-10-CM

## 2020-08-03 DIAGNOSIS — N179 Acute kidney failure, unspecified: Secondary | ICD-10-CM

## 2020-08-03 DIAGNOSIS — I5031 Acute diastolic (congestive) heart failure: Secondary | ICD-10-CM

## 2020-08-03 DIAGNOSIS — M5441 Lumbago with sciatica, right side: Secondary | ICD-10-CM | POA: Diagnosis not present

## 2020-08-03 MED ORDER — FUROSEMIDE 20 MG PO TABS: 20.0000 mg | ORAL_TABLET | Freq: Every day | ORAL | 3 refills | Status: DC

## 2020-08-03 NOTE — Patient Instructions (Signed)
We have sent in the lasix which is the fluid pill to take for the next 2 days and then as needed for the swelling.  No labs today.  If we need to we can stop the eliquis and go back on the ibuprofen for pain.  For now try tylenol up to 2 pills (500 mg each) up to 3 times a day for pain.

## 2020-08-03 NOTE — Telephone Encounter (Signed)
Spoke with the patient's daughter and she is stated that he was not sent home with any fluid pills. She is going to bring all of [place] his medications to the visit.

## 2020-08-03 NOTE — Progress Notes (Signed)
   Subjective:   Patient ID: Cory Hansen, male    DOB: November 12, 1920, 87 y.o.   MRN: [id number]  HPI The patient is a 87 YO man coming in for rehab/hospital follow up (we have sent him to hospital for new a fib and fluid in lungs, he had workup and diuresis, then rehab for strength). He is now back home and doing okay. Still building up strength. Is not very active but able to walk to kitchen and back without getting winded. Denies new cough, nausea or vomiting, diarrhea or constipation. He is taking eliquis and metoprolol for the a fib and was advised not to use ibuprofen. He had previously been taking this for arthritis 2 times per day. They are going to try tylenol and had wanted to verify its safety and dosing with [place]. His 2 daughters are present with him and help provide history. Denies chest pains. Sometimes feels as though heart is irregular. He was not discharged with fluid pill but diastolic heart failure on echo from hospital. He is having some swelling in his legs since discharge from rehab facility.   PMH, [facility], social history reviewed and updated  Review of Systems  Constitutional: Positive for activity change and fatigue. Negative for appetite change, fever and unexpected weight change.  HENT: Negative.   Eyes: Negative.   Respiratory: Negative for cough, chest tightness and shortness of breath.   Cardiovascular: Positive for palpitations and leg swelling. Negative for chest pain.  Gastrointestinal: Negative for abdominal distention, abdominal pain, constipation, diarrhea, nausea and vomiting.  Musculoskeletal: Negative.   Skin: Negative.   Neurological: Negative.   Psychiatric/Behavioral: Negative.     Objective:  Physical Exam Constitutional:      Appearance: He is well-developed.  HENT:     Head: Normocephalic and atraumatic.     Ears:     Comments: Hard of hearing Cardiovascular:     Rate and Rhythm: Normal rate. Rhythm irregular.  Pulmonary:     Effort: Pulmonary  effort is normal. No respiratory distress.     Breath sounds: Normal breath sounds. No wheezing, rhonchi or rales.  Abdominal:     General: Bowel sounds are normal. There is no distension.     Palpations: Abdomen is soft.     Tenderness: There is no abdominal tenderness. There is no rebound.  Musculoskeletal:     Cervical back: Normal range of motion.     Right lower leg: Edema present.     Left lower leg: Edema present.  Skin:    General: Skin is warm and dry.  Neurological:     Mental Status: He is alert and oriented to person, place, and time.     Coordination: Coordination abnormal.     Comments: Wheelchair in office due to distance     Vitals:   08/03/20 1032  BP: 132/72  Pulse: (!) 53  Resp: 18  Temp: 97.7 F (36.5 C)  TempSrc: Oral  SpO2: 97%  Weight: 176 lb 9.6 oz (80.1 kg)  Height: 5\' 11"  (1.803 m)    This visit occurred during the SARS-CoV-2 public health emergency.  Safety protocols were in place, including screening questions prior to the visit, additional usage of staff PPE, and extensive cleaning of exam room while observing appropriate contact time as indicated for disinfecting solutions.   Assessment & Plan:

## 2020-08-04 DIAGNOSIS — I480 Paroxysmal atrial fibrillation: Secondary | ICD-10-CM | POA: Diagnosis not present

## 2020-08-04 DIAGNOSIS — Z9181 History of falling: Secondary | ICD-10-CM | POA: Diagnosis not present

## 2020-08-04 DIAGNOSIS — I5033 Acute on chronic diastolic (congestive) heart failure: Secondary | ICD-10-CM | POA: Diagnosis not present

## 2020-08-04 DIAGNOSIS — Z8551 Personal history of malignant neoplasm of bladder: Secondary | ICD-10-CM | POA: Diagnosis not present

## 2020-08-04 DIAGNOSIS — M5441 Lumbago with sciatica, right side: Secondary | ICD-10-CM | POA: Diagnosis not present

## 2020-08-04 DIAGNOSIS — M1990 Unspecified osteoarthritis, unspecified site: Secondary | ICD-10-CM | POA: Diagnosis not present

## 2020-08-04 DIAGNOSIS — Z7901 Long term (current) use of anticoagulants: Secondary | ICD-10-CM | POA: Diagnosis not present

## 2020-08-04 DIAGNOSIS — Z8616 Personal history of COVID-19: Secondary | ICD-10-CM | POA: Diagnosis not present

## 2020-08-04 DIAGNOSIS — M129 Arthropathy, unspecified: Secondary | ICD-10-CM | POA: Diagnosis not present

## 2020-08-04 NOTE — Assessment & Plan Note (Signed)
With recurrence swelling in the legs, rx lasix and advised them to take daily for 2-3 days then as needed.

## 2020-08-04 NOTE — Assessment & Plan Note (Signed)
Resolved at discharge. Will remove from list.

## 2020-08-04 NOTE — Assessment & Plan Note (Signed)
Has orthopedic pain in the back and knees. Given dosing and schedule for tylenol and they are going to see orthopedics. Advised while on eliquis not to take ibuprofen regularly.

## 2020-08-04 NOTE — Assessment & Plan Note (Signed)
We did discuss today CHADsVASC risk 3 equivalent to 3-4% risk stroke per year. He is taking eliquis and metoprolol and rate controlled today. If unable to control his pain we could consider stopping eliquis and allowing him to resume ibuprofen for pain if needed and accepting risk of stroke. At 97 he values his QOL and not being in pain all the time with walking from knees.

## 2020-08-07 DIAGNOSIS — M1990 Unspecified osteoarthritis, unspecified site: Secondary | ICD-10-CM | POA: Diagnosis not present

## 2020-08-07 DIAGNOSIS — I5033 Acute on chronic diastolic (congestive) heart failure: Secondary | ICD-10-CM | POA: Diagnosis not present

## 2020-08-07 DIAGNOSIS — I480 Paroxysmal atrial fibrillation: Secondary | ICD-10-CM | POA: Diagnosis not present

## 2020-08-07 DIAGNOSIS — M5441 Lumbago with sciatica, right side: Secondary | ICD-10-CM | POA: Diagnosis not present

## 2020-08-07 DIAGNOSIS — Z7901 Long term (current) use of anticoagulants: Secondary | ICD-10-CM | POA: Diagnosis not present

## 2020-08-07 DIAGNOSIS — Z9181 History of falling: Secondary | ICD-10-CM | POA: Diagnosis not present

## 2020-08-08 ENCOUNTER — Telehealth: Payer: Self-pay | Admitting: Internal Medicine

## 2020-08-08 NOTE — Telephone Encounter (Signed)
Cory Hansen is requesting nursing verbals for 1w8 for CHF and medication education. Please advise

## 2020-08-08 NOTE — Telephone Encounter (Signed)
Fine for verbals °

## 2020-08-08 NOTE — Telephone Encounter (Signed)
See below

## 2020-08-09 NOTE — Telephone Encounter (Signed)
Spoke with Michelle to give verbal orders. 

## 2020-08-10 DIAGNOSIS — I480 Paroxysmal atrial fibrillation: Secondary | ICD-10-CM | POA: Diagnosis not present

## 2020-08-10 DIAGNOSIS — Z7901 Long term (current) use of anticoagulants: Secondary | ICD-10-CM | POA: Diagnosis not present

## 2020-08-10 DIAGNOSIS — Z9181 History of falling: Secondary | ICD-10-CM | POA: Diagnosis not present

## 2020-08-10 DIAGNOSIS — M1990 Unspecified osteoarthritis, unspecified site: Secondary | ICD-10-CM | POA: Diagnosis not present

## 2020-08-10 DIAGNOSIS — I5033 Acute on chronic diastolic (congestive) heart failure: Secondary | ICD-10-CM | POA: Diagnosis not present

## 2020-08-10 DIAGNOSIS — M5441 Lumbago with sciatica, right side: Secondary | ICD-10-CM | POA: Diagnosis not present

## 2020-08-11 DIAGNOSIS — I5033 Acute on chronic diastolic (congestive) heart failure: Secondary | ICD-10-CM | POA: Diagnosis not present

## 2020-08-11 DIAGNOSIS — I480 Paroxysmal atrial fibrillation: Secondary | ICD-10-CM | POA: Diagnosis not present

## 2020-08-11 DIAGNOSIS — M1990 Unspecified osteoarthritis, unspecified site: Secondary | ICD-10-CM | POA: Diagnosis not present

## 2020-08-11 DIAGNOSIS — M5441 Lumbago with sciatica, right side: Secondary | ICD-10-CM | POA: Diagnosis not present

## 2020-08-11 DIAGNOSIS — Z7901 Long term (current) use of anticoagulants: Secondary | ICD-10-CM | POA: Diagnosis not present

## 2020-08-11 DIAGNOSIS — Z9181 History of falling: Secondary | ICD-10-CM | POA: Diagnosis not present

## 2020-08-14 DIAGNOSIS — M1711 Unilateral primary osteoarthritis, right knee: Secondary | ICD-10-CM | POA: Diagnosis not present

## 2020-08-15 ENCOUNTER — Telehealth: Payer: Self-pay | Admitting: Internal Medicine

## 2020-08-15 DIAGNOSIS — I5033 Acute on chronic diastolic (congestive) heart failure: Secondary | ICD-10-CM | POA: Diagnosis not present

## 2020-08-15 DIAGNOSIS — I480 Paroxysmal atrial fibrillation: Secondary | ICD-10-CM | POA: Diagnosis not present

## 2020-08-15 DIAGNOSIS — M1990 Unspecified osteoarthritis, unspecified site: Secondary | ICD-10-CM | POA: Diagnosis not present

## 2020-08-15 DIAGNOSIS — Z9181 History of falling: Secondary | ICD-10-CM | POA: Diagnosis not present

## 2020-08-15 DIAGNOSIS — Z7901 Long term (current) use of anticoagulants: Secondary | ICD-10-CM | POA: Diagnosis not present

## 2020-08-15 DIAGNOSIS — M5441 Lumbago with sciatica, right side: Secondary | ICD-10-CM | POA: Diagnosis not present

## 2020-08-15 NOTE — Telephone Encounter (Signed)
Liji w/ [facility] is requesting verbals to omit other visits for this week. She said that the patient will only be seen once this week. Please advise    Phone: [phone]

## 2020-08-16 DIAGNOSIS — M5441 Lumbago with sciatica, right side: Secondary | ICD-10-CM | POA: Diagnosis not present

## 2020-08-16 DIAGNOSIS — M1990 Unspecified osteoarthritis, unspecified site: Secondary | ICD-10-CM | POA: Diagnosis not present

## 2020-08-16 DIAGNOSIS — Z7901 Long term (current) use of anticoagulants: Secondary | ICD-10-CM | POA: Diagnosis not present

## 2020-08-16 DIAGNOSIS — Z9181 History of falling: Secondary | ICD-10-CM | POA: Diagnosis not present

## 2020-08-16 DIAGNOSIS — I480 Paroxysmal atrial fibrillation: Secondary | ICD-10-CM | POA: Diagnosis not present

## 2020-08-16 DIAGNOSIS — I5033 Acute on chronic diastolic (congestive) heart failure: Secondary | ICD-10-CM | POA: Diagnosis not present

## 2020-08-16 NOTE — Telephone Encounter (Signed)
Fine

## 2020-08-16 NOTE — Telephone Encounter (Signed)
See below

## 2020-08-17 ENCOUNTER — Telehealth: Payer: Self-pay | Admitting: Internal Medicine

## 2020-08-17 NOTE — Telephone Encounter (Signed)
See message below,thanks

## 2020-08-17 NOTE — Telephone Encounter (Signed)
Ashleigh w/ [facility] called and said that the patient is scheduled for ST eval for tomorrow. She said that they missed the order and she wanted to inform Dr. Okey Dupre. Please advise   Phone: [phone]

## 2020-08-17 NOTE — Telephone Encounter (Signed)
Spoke with Liji to give verbal orders. No other questions or concerns at this time. 

## 2020-08-18 ENCOUNTER — Telehealth: Payer: Self-pay | Admitting: Internal Medicine

## 2020-08-18 NOTE — Telephone Encounter (Signed)
Fine

## 2020-08-18 NOTE — Telephone Encounter (Signed)
Renee a Doctor, general practice with [facility] calling, making [place] aware that the patient refused the speech therapy evaluation today.

## 2020-08-18 NOTE — Telephone Encounter (Signed)
Fyi.

## 2020-08-18 NOTE — Telephone Encounter (Signed)
Noted  

## 2020-08-21 DIAGNOSIS — M1711 Unilateral primary osteoarthritis, right knee: Secondary | ICD-10-CM | POA: Diagnosis not present

## 2020-08-23 DIAGNOSIS — M5441 Lumbago with sciatica, right side: Secondary | ICD-10-CM | POA: Diagnosis not present

## 2020-08-23 DIAGNOSIS — M1990 Unspecified osteoarthritis, unspecified site: Secondary | ICD-10-CM | POA: Diagnosis not present

## 2020-08-23 DIAGNOSIS — Z9181 History of falling: Secondary | ICD-10-CM | POA: Diagnosis not present

## 2020-08-23 DIAGNOSIS — Z7901 Long term (current) use of anticoagulants: Secondary | ICD-10-CM | POA: Diagnosis not present

## 2020-08-23 DIAGNOSIS — I480 Paroxysmal atrial fibrillation: Secondary | ICD-10-CM | POA: Diagnosis not present

## 2020-08-23 DIAGNOSIS — I5033 Acute on chronic diastolic (congestive) heart failure: Secondary | ICD-10-CM | POA: Diagnosis not present

## 2020-08-28 DIAGNOSIS — M1711 Unilateral primary osteoarthritis, right knee: Secondary | ICD-10-CM | POA: Diagnosis not present

## 2020-08-28 DIAGNOSIS — M25561 Pain in right knee: Secondary | ICD-10-CM | POA: Diagnosis not present

## 2020-08-30 DIAGNOSIS — Z7901 Long term (current) use of anticoagulants: Secondary | ICD-10-CM | POA: Diagnosis not present

## 2020-08-30 DIAGNOSIS — M1990 Unspecified osteoarthritis, unspecified site: Secondary | ICD-10-CM | POA: Diagnosis not present

## 2020-08-30 DIAGNOSIS — M5441 Lumbago with sciatica, right side: Secondary | ICD-10-CM | POA: Diagnosis not present

## 2020-08-30 DIAGNOSIS — Z9181 History of falling: Secondary | ICD-10-CM | POA: Diagnosis not present

## 2020-08-30 DIAGNOSIS — I5033 Acute on chronic diastolic (congestive) heart failure: Secondary | ICD-10-CM | POA: Diagnosis not present

## 2020-08-30 DIAGNOSIS — I480 Paroxysmal atrial fibrillation: Secondary | ICD-10-CM | POA: Diagnosis not present

## 2020-08-31 DIAGNOSIS — Z7901 Long term (current) use of anticoagulants: Secondary | ICD-10-CM | POA: Diagnosis not present

## 2020-08-31 DIAGNOSIS — M5441 Lumbago with sciatica, right side: Secondary | ICD-10-CM | POA: Diagnosis not present

## 2020-08-31 DIAGNOSIS — Z9181 History of falling: Secondary | ICD-10-CM | POA: Diagnosis not present

## 2020-08-31 DIAGNOSIS — I5033 Acute on chronic diastolic (congestive) heart failure: Secondary | ICD-10-CM | POA: Diagnosis not present

## 2020-08-31 DIAGNOSIS — I480 Paroxysmal atrial fibrillation: Secondary | ICD-10-CM | POA: Diagnosis not present

## 2020-08-31 DIAGNOSIS — M1990 Unspecified osteoarthritis, unspecified site: Secondary | ICD-10-CM | POA: Diagnosis not present

## 2020-09-06 DIAGNOSIS — M1990 Unspecified osteoarthritis, unspecified site: Secondary | ICD-10-CM | POA: Diagnosis not present

## 2020-09-06 DIAGNOSIS — Z9181 History of falling: Secondary | ICD-10-CM | POA: Diagnosis not present

## 2020-09-06 DIAGNOSIS — Z7901 Long term (current) use of anticoagulants: Secondary | ICD-10-CM | POA: Diagnosis not present

## 2020-09-06 DIAGNOSIS — I480 Paroxysmal atrial fibrillation: Secondary | ICD-10-CM | POA: Diagnosis not present

## 2020-09-06 DIAGNOSIS — I5033 Acute on chronic diastolic (congestive) heart failure: Secondary | ICD-10-CM | POA: Diagnosis not present

## 2020-09-06 DIAGNOSIS — M5441 Lumbago with sciatica, right side: Secondary | ICD-10-CM | POA: Diagnosis not present

## 2020-09-07 DIAGNOSIS — I480 Paroxysmal atrial fibrillation: Secondary | ICD-10-CM | POA: Diagnosis not present

## 2020-09-07 DIAGNOSIS — Z9181 History of falling: Secondary | ICD-10-CM | POA: Diagnosis not present

## 2020-09-07 DIAGNOSIS — M1990 Unspecified osteoarthritis, unspecified site: Secondary | ICD-10-CM | POA: Diagnosis not present

## 2020-09-07 DIAGNOSIS — M5441 Lumbago with sciatica, right side: Secondary | ICD-10-CM | POA: Diagnosis not present

## 2020-09-07 DIAGNOSIS — I5033 Acute on chronic diastolic (congestive) heart failure: Secondary | ICD-10-CM | POA: Diagnosis not present

## 2020-09-07 DIAGNOSIS — Z7901 Long term (current) use of anticoagulants: Secondary | ICD-10-CM | POA: Diagnosis not present

## 2020-09-19 DIAGNOSIS — M545 Low back pain, unspecified: Secondary | ICD-10-CM | POA: Diagnosis not present

## 2020-09-19 DIAGNOSIS — M25551 Pain in right hip: Secondary | ICD-10-CM | POA: Diagnosis not present

## 2020-10-03 ENCOUNTER — Telehealth: Payer: Self-pay | Admitting: Internal Medicine

## 2020-10-03 NOTE — Telephone Encounter (Signed)
Patient's daughter [name] called   States he has not have a bowel movement for 4 days. Scheduled an appointment but in the meantime she is wondering if they can do an enema to help him.   Please advise.

## 2020-10-03 NOTE — Telephone Encounter (Signed)
Yes can try enema or miralax these are otc.

## 2020-10-03 NOTE — Telephone Encounter (Signed)
See below. Looks like he is scheduled to see Dr. Jonny Ruiz on 10/05/2020 at 2:40 pm

## 2020-10-03 NOTE — Telephone Encounter (Signed)
Called patient's daughter. LVM with Crawford's recommendations. Office number was provided.

## 2020-10-04 ENCOUNTER — Telehealth: Payer: Self-pay | Admitting: Internal Medicine

## 2020-10-04 NOTE — Chronic Care Management (AMB) (Signed)
  Chronic Care Management   Note  10/04/2020 Name: Cory Hansen MRN: [id number] DOB: June 16, 1920  Cory Hansen is a 87 y.o. year old male who is a primary care patient of [name], MD. I reached out to Cory Hansen Coriz by phone today in response to a referral sent by [name]'s PCP, [name], MD.   [name] was given information about Chronic Care Management services today including:  CCM service includes personalized support from designated clinical staff supervised by his physician, including individualized plan of care and coordination with other care providers 24/7 contact phone numbers for assistance for urgent and routine care needs. Service will only be billed when office clinical staff spend 20 minutes or more in a month to coordinate care. Only one practitioner may furnish and bill the service in a calendar month. The patient may stop CCM services at any time (effective at the end of the month) by phone call to the office staff.   Patient did not agree to enrollment in care management services and does not wish to consider at this time.  Follow up plan:   Carmell [place] [employer] Scheduler

## 2020-10-05 ENCOUNTER — Ambulatory Visit: Payer: Medicare Other | Admitting: Internal Medicine

## 2020-10-12 DIAGNOSIS — R350 Frequency of micturition: Secondary | ICD-10-CM | POA: Diagnosis not present

## 2020-10-12 DIAGNOSIS — R3914 Feeling of incomplete bladder emptying: Secondary | ICD-10-CM | POA: Diagnosis not present

## 2020-10-12 DIAGNOSIS — R3912 Poor urinary stream: Secondary | ICD-10-CM | POA: Diagnosis not present

## 2020-10-20 ENCOUNTER — Emergency Department (HOSPITAL_COMMUNITY): Payer: Medicare Other

## 2020-10-20 ENCOUNTER — Encounter (HOSPITAL_COMMUNITY): Payer: Self-pay

## 2020-10-20 ENCOUNTER — Inpatient Hospital Stay (HOSPITAL_COMMUNITY)
Admission: EM | Admit: 2020-10-20 | Discharge: 2020-10-26 | DRG: 872 | Disposition: A | Discharge status: Home / Self Care | Payer: Medicare Other | Attending: Student | Admitting: Student

## 2020-10-20 ENCOUNTER — Other Ambulatory Visit: Payer: Self-pay

## 2020-10-20 DIAGNOSIS — K8033 Calculus of bile duct with acute cholangitis with obstruction: Secondary | ICD-10-CM | POA: Diagnosis not present

## 2020-10-20 DIAGNOSIS — Z87442 Personal history of urinary calculi: Secondary | ICD-10-CM | POA: Diagnosis not present

## 2020-10-20 DIAGNOSIS — K8050 Calculus of bile duct without cholangitis or cholecystitis without obstruction: Secondary | ICD-10-CM | POA: Diagnosis present

## 2020-10-20 DIAGNOSIS — D649 Anemia, unspecified: Secondary | ICD-10-CM | POA: Diagnosis present

## 2020-10-20 DIAGNOSIS — I509 Heart failure, unspecified: Secondary | ICD-10-CM | POA: Diagnosis not present

## 2020-10-20 DIAGNOSIS — Z66 Do not resuscitate: Secondary | ICD-10-CM | POA: Diagnosis present

## 2020-10-20 DIAGNOSIS — J90 Pleural effusion, not elsewhere classified: Secondary | ICD-10-CM | POA: Diagnosis not present

## 2020-10-20 DIAGNOSIS — D500 Iron deficiency anemia secondary to blood loss (chronic): Secondary | ICD-10-CM | POA: Diagnosis not present

## 2020-10-20 DIAGNOSIS — R350 Frequency of micturition: Secondary | ICD-10-CM | POA: Diagnosis not present

## 2020-10-20 DIAGNOSIS — E872 Acidosis: Secondary | ICD-10-CM | POA: Diagnosis not present

## 2020-10-20 DIAGNOSIS — E785 Hyperlipidemia, unspecified: Secondary | ICD-10-CM | POA: Diagnosis present

## 2020-10-20 DIAGNOSIS — Z7901 Long term (current) use of anticoagulants: Secondary | ICD-10-CM | POA: Diagnosis not present

## 2020-10-20 DIAGNOSIS — R933 Abnormal findings on diagnostic imaging of other parts of digestive tract: Secondary | ICD-10-CM

## 2020-10-20 DIAGNOSIS — I110 Hypertensive heart disease with heart failure: Secondary | ICD-10-CM | POA: Diagnosis present

## 2020-10-20 DIAGNOSIS — H9190 Unspecified hearing loss, unspecified ear: Secondary | ICD-10-CM | POA: Diagnosis not present

## 2020-10-20 DIAGNOSIS — I4891 Unspecified atrial fibrillation: Secondary | ICD-10-CM | POA: Diagnosis present

## 2020-10-20 DIAGNOSIS — N401 Enlarged prostate with lower urinary tract symptoms: Secondary | ICD-10-CM | POA: Diagnosis present

## 2020-10-20 DIAGNOSIS — A419 Sepsis, unspecified organism: Principal | ICD-10-CM | POA: Diagnosis present

## 2020-10-20 DIAGNOSIS — N281 Cyst of kidney, acquired: Secondary | ICD-10-CM | POA: Diagnosis not present

## 2020-10-20 DIAGNOSIS — F0390 Unspecified dementia without behavioral disturbance: Secondary | ICD-10-CM | POA: Diagnosis present

## 2020-10-20 DIAGNOSIS — Z8551 Personal history of malignant neoplasm of bladder: Secondary | ICD-10-CM | POA: Diagnosis not present

## 2020-10-20 DIAGNOSIS — R1010 Upper abdominal pain, unspecified: Secondary | ICD-10-CM | POA: Diagnosis not present

## 2020-10-20 DIAGNOSIS — K219 Gastro-esophageal reflux disease without esophagitis: Secondary | ICD-10-CM | POA: Diagnosis present

## 2020-10-20 DIAGNOSIS — R945 Abnormal results of liver function studies: Secondary | ICD-10-CM | POA: Diagnosis not present

## 2020-10-20 DIAGNOSIS — I4821 Permanent atrial fibrillation: Secondary | ICD-10-CM | POA: Diagnosis present

## 2020-10-20 DIAGNOSIS — I480 Paroxysmal atrial fibrillation: Secondary | ICD-10-CM | POA: Diagnosis not present

## 2020-10-20 DIAGNOSIS — E876 Hypokalemia: Secondary | ICD-10-CM | POA: Diagnosis not present

## 2020-10-20 DIAGNOSIS — K828 Other specified diseases of gallbladder: Secondary | ICD-10-CM | POA: Diagnosis not present

## 2020-10-20 DIAGNOSIS — Z20822 Contact with and (suspected) exposure to covid-19: Secondary | ICD-10-CM | POA: Diagnosis present

## 2020-10-20 DIAGNOSIS — Z79899 Other long term (current) drug therapy: Secondary | ICD-10-CM

## 2020-10-20 DIAGNOSIS — M1990 Unspecified osteoarthritis, unspecified site: Secondary | ICD-10-CM | POA: Diagnosis not present

## 2020-10-20 DIAGNOSIS — I5032 Chronic diastolic (congestive) heart failure: Secondary | ICD-10-CM

## 2020-10-20 DIAGNOSIS — Z89432 Acquired absence of left foot: Secondary | ICD-10-CM | POA: Diagnosis not present

## 2020-10-20 DIAGNOSIS — K8080 Other cholelithiasis without obstruction: Secondary | ICD-10-CM | POA: Diagnosis not present

## 2020-10-20 DIAGNOSIS — R109 Unspecified abdominal pain: Secondary | ICD-10-CM | POA: Diagnosis not present

## 2020-10-20 DIAGNOSIS — R079 Chest pain, unspecified: Secondary | ICD-10-CM | POA: Diagnosis not present

## 2020-10-20 DIAGNOSIS — R6520 Severe sepsis without septic shock: Secondary | ICD-10-CM | POA: Diagnosis present

## 2020-10-20 DIAGNOSIS — K8309 Other cholangitis: Secondary | ICD-10-CM | POA: Diagnosis not present

## 2020-10-20 DIAGNOSIS — R0789 Other chest pain: Secondary | ICD-10-CM | POA: Diagnosis not present

## 2020-10-20 DIAGNOSIS — R748 Abnormal levels of other serum enzymes: Secondary | ICD-10-CM | POA: Diagnosis not present

## 2020-10-20 HISTORY — DX: Calculus of bile duct without cholangitis or cholecystitis without obstruction: K80.50

## 2020-10-20 LAB — COMPREHENSIVE METABOLIC PANEL
ALT: 119 U/L — ABNORMAL HIGH (ref 0–44)
AST: 152 U/L — ABNORMAL HIGH (ref 15–41)
Albumin: 4.1 g/dL (ref 3.5–5.0)
Alkaline Phosphatase: 449 U/L — ABNORMAL HIGH (ref 38–126)
Anion gap: 10 (ref 5–15)
BUN: 18 mg/dL (ref 8–23)
CO2: 21 mmol/L — ABNORMAL LOW (ref 22–32)
Calcium: 9.4 mg/dL (ref 8.9–10.3)
Chloride: 108 mmol/L (ref 98–111)
Creatinine, Ser: 1.22 mg/dL (ref 0.61–1.24)
GFR, Estimated: 54 mL/min — ABNORMAL LOW (ref >60)
Glucose, Bld: 106 mg/dL — ABNORMAL HIGH (ref 70–99)
Potassium: 4.3 mmol/L (ref 3.5–5.1)
Sodium: 139 mmol/L (ref 135–145)
Total Bilirubin: 2.8 mg/dL — ABNORMAL HIGH (ref 0.3–1.2)
Total Protein: 7.8 g/dL (ref 6.5–8.1)

## 2020-10-20 LAB — CBC WITH DIFFERENTIAL/PLATELET
Abs Immature Granulocytes: 0.05 10*3/uL (ref 0.00–0.07)
Basophils Absolute: 0 10*3/uL (ref 0.0–0.1)
Basophils Relative: 0 %
Eosinophils Absolute: 0.1 10*3/uL (ref 0.0–0.5)
Eosinophils Relative: 1 %
HCT: 39.5 % (ref 39.0–52.0)
Hemoglobin: 12.1 g/dL — ABNORMAL LOW (ref 13.0–17.0)
Immature Granulocytes: 0 %
Lymphocytes Relative: 8 %
Lymphs Abs: 0.9 10*3/uL (ref 0.7–4.0)
MCH: 30.2 pg (ref 26.0–34.0)
MCHC: 30.6 g/dL (ref 30.0–36.0)
MCV: 98.5 fL (ref 80.0–100.0)
Monocytes Absolute: 0.5 10*3/uL (ref 0.1–1.0)
Monocytes Relative: 4 %
Neutro Abs: 9.8 10*3/uL — ABNORMAL HIGH (ref 1.7–7.7)
Neutrophils Relative %: 87 %
Platelets: 271 10*3/uL (ref 150–400)
RBC: 4.01 MIL/uL — ABNORMAL LOW (ref 4.22–5.81)
RDW: 14.8 % (ref 11.5–15.5)
WBC: 11.4 10*3/uL — ABNORMAL HIGH (ref 4.0–10.5)
nRBC: 0 % (ref 0.0–0.2)

## 2020-10-20 LAB — LIPASE, BLOOD: Lipase: 47 U/L (ref 11–51)

## 2020-10-20 LAB — RESP PANEL BY RT-PCR (FLU A&B, COVID) ARPGX2
Influenza A by PCR: NEGATIVE
Influenza B by PCR: NEGATIVE
SARS Coronavirus 2 by RT PCR: NEGATIVE

## 2020-10-20 LAB — TROPONIN I (HIGH SENSITIVITY)
Troponin I (High Sensitivity): 7 ng/L (ref <18)
Troponin I (High Sensitivity): 7 ng/L (ref <18)

## 2020-10-20 MED ORDER — GADOBUTROL 1 MMOL/ML IV SOLN
8.0000 mL | Freq: Once | INTRAVENOUS | Status: AC | PRN
  Administered 2020-10-20: 8 mL via INTRAVENOUS

## 2020-10-20 MED ORDER — SODIUM CHLORIDE 0.9 % IV SOLN: INTRAVENOUS | Status: DC

## 2020-10-20 MED ORDER — SODIUM CHLORIDE 0.9 % IV BOLUS
500.0000 mL | Freq: Once | INTRAVENOUS | Status: AC
  Administered 2020-10-20: 500 mL via INTRAVENOUS

## 2020-10-20 NOTE — ED Notes (Signed)
X-ray at bedside

## 2020-10-20 NOTE — ED Notes (Signed)
Per MRI, they will transport pt to scan around 9 pm.

## 2020-10-20 NOTE — ED Triage Notes (Signed)
Patient states that he was awakened by mid chest pain while taking a nap this afternoon around 1400 today. Patient states when the chest pain happened he had SOB.

## 2020-10-20 NOTE — ED Provider Notes (Signed)
[facility]-EMERGENCY DEPT Provider Note   CSN: [id number] Arrival date & time: 10/20/20  1628     History Chief Complaint  Patient presents with   Chest Pain    Benzion Mesta Dimmitt is a 87 y.o. male.  HPI 87 year old male presents with acute chest pain or shortness of breath.  This started around 3 PM.  He woke up from a nap and shortly thereafter started having his acute pain.  It felt like he was having indigestion.  He was also acutely short of breath.  Asked his daughter to bring him to the hospital.  However now that he is in the hospital his symptoms have acutely resolved.  He states he might of coughed a couple times this morning but otherwise no cough.  He has not drank much today since around 8 AM.  He otherwise has had no acute illness or preceding symptoms.  Past Medical History:  Diagnosis Date   Arthritis    Atrial fibrillation [facility])    Kidney stones    Shingles     Patient Active Problem List   Diagnosis Date Noted   CHF (congestive heart failure) (HCC) 06/20/2020   New onset atrial fibrillation (HCC) 06/19/2020   Acute CHF (HCC) 06/19/2020   Hypokalemia 03/14/2020   Acute right ankle pain 05/07/2019   Diarrhea 10/27/2015   Urinary dribbling 07/26/2015   Routine general medical examination at a health care facility 10/13/2014   CT, CHEST, ABNORMAL 01/23/2009   Chronic bilateral low back pain with right-sided sciatica 01/17/2009   NEOPLASM, MALIGNANT, BLADDER, HX OF 01/17/2009   Hx of benign neoplasm of prostate 01/17/2009   Hyperlipidemia 01/16/2009   ARTHRITIS 01/16/2009   Weakness 01/16/2009    Past Surgical History:  Procedure Laterality Date   AMPUTATION Left 02/22/2014   Procedure: INCISION AND DRAINAGE LEFT FOREFOOT AMPUTATION FIRST RAY;  Surgeon: Toni Arthurs, MD;  Location: [facility] OR;  Service: Orthopedics;  Laterality: Left;   APPENDECTOMY     EYE SURGERY Bilateral    cataract surgery w/ lens implant   HERNIA REPAIR     inguinal  hernia repair       Family History  Problem Relation Age of Onset   Heart disease Neg Hx        No heart disease in parents    Social History   Tobacco Use   Smoking status: Never   Smokeless tobacco: Never  Vaping Use   Vaping Use: Never used  Substance Use Topics   Alcohol use: Yes    Alcohol/week: 0.0 standard drinks    Comment: very rare   Drug use: No    Home Medications Prior to Admission medications   Medication Sig Start Date End Date Taking? Authorizing Provider  acetaminophen (TYLENOL) 500 MG tablet Take 500 mg by mouth 2 (two) times daily.   Yes [provider]  apixaban (ELIQUIS) 5 MG TABS tablet Take 1 tablet (5 mg total) by mouth 2 (two) times daily. 07/24/20  Yes [name], MD  docusate sodium (COLACE) 100 MG capsule Take 100 mg by mouth daily as needed for mild constipation.   Yes [provider]  fluticasone (FLONASE) 50 MCG/ACT nasal spray Place 1 spray into both nostrils daily as needed for allergies.   Yes [provider]  furosemide (LASIX) 20 MG tablet Take 1 tablet (20 mg total) by mouth daily. 08/03/20  Yes [name], MD  hydroxypropyl methylcellulose / hypromellose (ISOPTO TEARS / GONIOVISC)  2.5 % ophthalmic solution Place into both eyes daily as needed for dry eyes.   Yes [provider]  magnesium citrate SOLN Take 1 Bottle by mouth daily as needed for severe constipation or moderate constipation.   Yes [provider]  metoprolol tartrate (LOPRESSOR) 25 MG tablet Take 0.5 tablets (12.5 mg total) by mouth 2 (two) times daily. 07/24/20  Yes [name], Elizabeth A, MD  tamsulosin (FLOMAX) 0.4 MG CAPS capsule Take 0.4 mg by mouth at bedtime. 10/12/20  Yes [provider]  guaiFENesin-dextromethorphan (ROBITUSSIN DM) 100-10 MG/5ML syrup Take 5 mLs by mouth every 4 (four) hours as needed for cough (chest congestion). Patient not taking: No sig reported 03/20/20   Drema DallasWoods, [name], MD   pantoprazole (PROTONIX) 40 MG tablet Take 1 tablet (40 mg total) by mouth daily. Patient not taking: No sig reported 04/18/20   [name], Elizabeth A, MD  polyethylene glycol (MIRALAX / GLYCOLAX) 17 g packet Take 17 g by mouth daily as needed for mild constipation. Patient not taking: No sig reported 06/29/20   Azucena FallenLancaster, William C, MD  vitamin B-12 (CYANOCOBALAMIN) 1000 MCG tablet Take 1 tablet (1,000 mcg total) by mouth daily. Patient not taking: No sig reported 10/14/14   [name], Elizabeth A, MD    Allergies    Patient has no known allergies.  Review of Systems   Review of Systems  Constitutional:  Negative for fever.  Respiratory:  Positive for shortness of breath.   Cardiovascular:  Positive for chest pain and leg swelling (chronic, unchanged).  Gastrointestinal:  Negative for abdominal pain.  All other systems reviewed and are negative.  Physical Exam Updated Vital Signs BP 97/60   Pulse 76   Temp 99.9 F (37.7 C) (Oral)   Resp 18   SpO2 96%   Physical Exam Vitals and nursing note reviewed.  Constitutional:      General: He is not in acute distress.    Appearance: He is well-developed. He is not ill-appearing or diaphoretic.  HENT:     Head: Normocephalic and atraumatic.     Right Ear: External ear normal.     Left Ear: External ear normal.     Nose: Nose normal.  Eyes:     General:        Right eye: No discharge.        Left eye: No discharge.  Cardiovascular:     Rate and Rhythm: Regular rhythm. Tachycardia present.     Heart sounds: Normal heart sounds.     Comments: ~100 Pulmonary:     Effort: Pulmonary effort is normal.     Breath sounds: Normal breath sounds.  Abdominal:     Palpations: Abdomen is soft.     Tenderness: There is no abdominal tenderness.  Musculoskeletal:     Cervical back: Neck supple.  Skin:    General: Skin is warm and dry.  Neurological:     Mental Status: He is alert.  Psychiatric:        Mood and Affect: Mood is not anxious.     ED Results / Procedures / Treatments   Labs (all labs ordered are listed, but only abnormal results are displayed) Labs Reviewed  COMPREHENSIVE METABOLIC PANEL - Abnormal; Notable for the following components:      Result Value   CO2 21 (*)    Glucose, Bld 106 (*)    AST 152 (*)    ALT 119 (*)    Alkaline Phosphatase 449 (*)  Total Bilirubin 2.8 (*)    GFR, Estimated 54 (*)    All other components within normal limits  CBC WITH DIFFERENTIAL/PLATELET - Abnormal; Notable for the following components:   WBC 11.4 (*)    RBC 4.01 (*)    Hemoglobin 12.1 (*)    Neutro Abs 9.8 (*)    All other components within normal limits  RESP PANEL BY RT-PCR (FLU A&B, COVID) ARPGX2  LIPASE, BLOOD  TROPONIN I (HIGH SENSITIVITY)  TROPONIN I (HIGH SENSITIVITY)    EKG EKG Interpretation  Date/Time:  Friday October 20 2020 16:32:22 EDT Ventricular Rate:  96 PR Interval:  163 QRS Duration: 98 QT Interval:  378 QTC Calculation: 478 R Axis:   52 Text Interpretation: Sinus rhythm Multiple ventricular premature complexes Borderline T wave abnormalities Borderline prolonged QT interval Confirmed by Pricilla Loveless [id number]) on 10/20/2020 4:51:57 PM  Radiology DG Chest Portable 1 View  Result Date: 10/20/2020 CLINICAL DATA:  Chest pain EXAM: PORTABLE CHEST 1 VIEW COMPARISON:  06/30/2020, 08/28/2009 FINDINGS: Small right-sided pleural effusion. Bilateral calcified pleural plaques. Stable cardiomediastinal silhouette with aortic atherosclerosis. No pneumothorax IMPRESSION: 1. Small right-sided pleural effusion. 2. Calcified pleural plaques bilaterally Electronically Signed   By: Jasmine Pang M.D.   On: 10/20/2020 17:27   MR ABDOMEN MRCP W WO CONTAST  Result Date: 10/20/2020 CLINICAL DATA:  Dilated common duct, evaluate for choledocholithiasis EXAM: MRI ABDOMEN WITHOUT AND WITH CONTRAST (INCLUDING MRCP) TECHNIQUE: Multiplanar multisequence MR imaging of the abdomen was performed both before and after  the administration of intravenous contrast. Heavily T2-weighted images of the biliary and pancreatic ducts were obtained, and three-dimensional MRCP images were rendered by post processing. CONTRAST:  78mL GADAVIST GADOBUTROL 1 MMOL/ML IV SOLN COMPARISON:  Right upper quadrant ultrasound dated 10/20/2020 FINDINGS: Motion degraded images. Lower chest: Lung bases are clear. Hepatobiliary: Liver is within normal limits. No suspicious/enhancing hepatic lesions. Gallbladder is mildly distended but grossly unremarkable. No cholelithiasis, gallbladder wall thickening, pericholecystic fluid, or inflammatory changes. No intrahepatic ductal dilatation. Common duct measures 9 mm, within the upper limits of normal. However, there is a 13 mm distal CBD stone (series 5/image 19). Pancreas:  Within normal limits. Spleen:  Within normal limits. Adrenals/Urinary Tract:  Adrenal glands are within normal limits. Multiple bilateral renal cysts, measuring up to 1.7 cm in the medial left upper kidney (series 14/image 22), benign (Bosniak I). Left renal sinus cysts. No hydronephrosis. Stomach/Bowel: Stomach is within normal limits. Visualized bowel is unremarkable. Vascular/Lymphatic:  No evidence of abdominal aortic aneurysm. No suspicious abdominal lymphadenopathy. Other:  No abdominal ascites. Musculoskeletal: Degenerative changes of the visualized thoracolumbar spine. IMPRESSION: Motion degraded images. Choledocholithiasis with a 13 mm distal CBD stone. Common duct measures 9 mm, within the upper limits of normal. No intrahepatic ductal dilatation. No cholelithiasis or associated inflammatory changes. Electronically Signed   By: Charline Bills M.D.   On: 10/20/2020 23:10   US Abdomen Limited RUQ (LIVER/GB)  Result Date: 10/20/2020 CLINICAL DATA:  Upper abdominal pain EXAM: ULTRASOUND ABDOMEN LIMITED RIGHT UPPER QUADRANT COMPARISON:  03/14/2020 FINDINGS: Gallbladder: No gallstones or wall thickening visualized. No sonographic  Murphy sign noted by sonographer. Common bile duct: Diameter: 9 mm in proximal diameter, progressively dilated since prior examination. Liver: There is limited evaluation of the liver with obscuration of muscle the hepatic parenchyma due to overlying bowel gas, aerated lung, and osseous structures. The visualized hepatic parenchyma demonstrates normal echogenicity and echotexture. No intrahepatic mass is identified. Portal vein is patent on color Doppler imaging with  normal direction of blood flow towards the liver. Other: No ascites IMPRESSION: Progressive dilation of the proximal extrahepatic bile duct. A distal obstructing lesion, such as choledocholithiasis, is not excluded. Correlation with liver enzymes is recommended. If indicated, ERCP or MRCP examination may be more helpful for evaluation of the distal duct. Electronically Signed   By: Helyn Numbers MD   On: 10/20/2020 19:05    Procedures Procedures   Medications Ordered in ED Medications  0.9 %  sodium chloride infusion (has no administration in time range)  sodium chloride 0.9 % bolus 500 mL (0 mLs Intravenous Stopped 10/20/20 1741)  gadobutrol (GADAVIST) 1 MMOL/ML injection 8 mL (8 mLs Intravenous Contrast Given 10/20/20 2245)    ED Course  I have reviewed the triage vital signs and the nursing notes.  Pertinent labs & imaging results that were available during my care of the patient were reviewed by me and considered in my medical decision making (see chart for details).    MDM Rules/Calculators/A&P                          Patient is currently asymptomatic.  LFTs were evaluated and are abnormal as above.  Thus ultrasound and then ultimately MRCP was obtained which does show a CBD stone.  No pancreatitis.  No infection. D/w Dr. Marca Ancona, who will see in AM. Last took eliquis this morning, so probably won't have ERCP tomorrow. Keep  NPO for now. D/w Dr. Mikeal Hawthorne, who will admit.  Final Clinical Impression(s) / ED Diagnoses Final  diagnoses:  Upper abdominal pain  Choledocholithiasis    Rx / DC Orders ED Discharge Orders     None        Pricilla Loveless, MD 10/20/20 2338

## 2020-10-20 NOTE — ED Notes (Signed)
U/S in  Progress at bedside

## 2020-10-20 NOTE — ED Notes (Signed)
ED Provider at bedside. 

## 2020-10-21 ENCOUNTER — Encounter (HOSPITAL_COMMUNITY): Payer: Self-pay | Admitting: Anesthesiology

## 2020-10-21 DIAGNOSIS — K8050 Calculus of bile duct without cholangitis or cholecystitis without obstruction: Secondary | ICD-10-CM | POA: Diagnosis not present

## 2020-10-21 DIAGNOSIS — F0390 Unspecified dementia without behavioral disturbance: Secondary | ICD-10-CM | POA: Diagnosis present

## 2020-10-21 LAB — CBC
HCT: 33.9 % — ABNORMAL LOW (ref 39.0–52.0)
Hemoglobin: 10.4 g/dL — ABNORMAL LOW (ref 13.0–17.0)
MCH: 30.6 pg (ref 26.0–34.0)
MCHC: 30.7 g/dL (ref 30.0–36.0)
MCV: 99.7 fL (ref 80.0–100.0)
Platelets: 196 10*3/uL (ref 150–400)
RBC: 3.4 MIL/uL — ABNORMAL LOW (ref 4.22–5.81)
RDW: 15 % (ref 11.5–15.5)
WBC: 16 10*3/uL — ABNORMAL HIGH (ref 4.0–10.5)
nRBC: 0 % (ref 0.0–0.2)

## 2020-10-21 LAB — COMPREHENSIVE METABOLIC PANEL
ALT: 121 U/L — ABNORMAL HIGH (ref 0–44)
AST: 137 U/L — ABNORMAL HIGH (ref 15–41)
Albumin: 3.1 g/dL — ABNORMAL LOW (ref 3.5–5.0)
Alkaline Phosphatase: 335 U/L — ABNORMAL HIGH (ref 38–126)
Anion gap: 9 (ref 5–15)
BUN: 17 mg/dL (ref 8–23)
CO2: 20 mmol/L — ABNORMAL LOW (ref 22–32)
Calcium: 8.5 mg/dL — ABNORMAL LOW (ref 8.9–10.3)
Chloride: 108 mmol/L (ref 98–111)
Creatinine, Ser: 0.89 mg/dL (ref 0.61–1.24)
GFR, Estimated: >60 mL/min (ref >60)
Glucose, Bld: 206 mg/dL — ABNORMAL HIGH (ref 70–99)
Potassium: 5.1 mmol/L (ref 3.5–5.1)
Sodium: 137 mmol/L (ref 135–145)
Total Bilirubin: 4 mg/dL — ABNORMAL HIGH (ref 0.3–1.2)
Total Protein: 6.1 g/dL — ABNORMAL LOW (ref 6.5–8.1)

## 2020-10-21 MED ORDER — DEXTROSE IN LACTATED RINGERS 5 % IV SOLN: INTRAVENOUS | Status: DC

## 2020-10-21 MED ORDER — KETOROLAC TROMETHAMINE 15 MG/ML IJ SOLN: 15.0000 mg | Freq: Four times a day (QID) | INTRAMUSCULAR | Status: DC | PRN

## 2020-10-21 MED ORDER — SODIUM CHLORIDE 0.9 % IV SOLN: INTRAVENOUS | Status: DC

## 2020-10-21 MED ORDER — LABETALOL HCL 5 MG/ML IV SOLN: 5.0000 mg | INTRAVENOUS | Status: DC | PRN

## 2020-10-21 MED ORDER — SODIUM CHLORIDE 0.9 % IV SOLN
1.0000 g | Freq: Two times a day (BID) | INTRAVENOUS | Status: DC
  Administered 2020-10-21 – 2020-10-23 (×5): 1 g via INTRAVENOUS
  Filled 2020-10-21 (×7): qty 1

## 2020-10-21 MED ORDER — ONDANSETRON HCL 4 MG/2ML IJ SOLN: 4.0000 mg | Freq: Four times a day (QID) | INTRAMUSCULAR | Status: DC | PRN

## 2020-10-21 MED ORDER — ONDANSETRON HCL 4 MG PO TABS: 4.0000 mg | ORAL_TABLET | Freq: Four times a day (QID) | ORAL | Status: DC | PRN

## 2020-10-21 MED ORDER — PANTOPRAZOLE SODIUM 40 MG IV SOLR
40.0000 mg | INTRAVENOUS | Status: DC
  Administered 2020-10-21 – 2020-10-24 (×4): 40 mg via INTRAVENOUS
  Filled 2020-10-21 (×4): qty 40

## 2020-10-21 MED ORDER — BOOST / RESOURCE BREEZE PO LIQD CUSTOM
1.0000 | Freq: Three times a day (TID) | ORAL | Status: DC
  Administered 2020-10-21 – 2020-10-23 (×7): 1 via ORAL

## 2020-10-21 NOTE — Progress Notes (Signed)
Pharmacy Antibiotic Note  Cory Hansen is a 87 y.o. male admitted on 10/20/2020 with acute chest which has resolved.  An ultrasound and MRCP was obtained which shows CBD stone.  Pharmacy has been consulted to dose merrem for intra-abdominal infection.  Plan: Merrem 1gm IV q12h Follow renal function and clinical course     Temp (24hrs), Avg:99.9 F (37.7 C), Min:99.9 F (37.7 C), Max:99.9 F (37.7 C)  Recent Labs  Lab 10/20/20 1651  WBC 11.4*  CREATININE 1.22    CrCl cannot be calculated (Unknown ideal weight.).    No Known Allergies   Thank you for allowing pharmacy to be a part of this patient's care.  Arley Phenix RPh 10/21/2020, 1:03 AM

## 2020-10-21 NOTE — Progress Notes (Signed)
Patient verbalized refusal for surgery, daughter Harriett Sine) at bedside, also clarified the refusal. GI and Attending were notified. Patient request to be treated with antibiotic only.

## 2020-10-21 NOTE — Progress Notes (Signed)
Patient seen and examined.  One of his daughter was at the bedside.  87 year old gentleman with dementia, still independently living at home.  He has 5 daughters they rotate to take care of him 24/7.  Presented with vague symptoms and complained of pain all over.  Found to have obstructive jaundice and transaminitis. Currently hemodynamically stable.  Plan: Last dose of Eliquis 7/15 AM.  Followed by GI.  On clears now.  Planning for ERCP 7/17. If successful ERCP, will need consult to surgery for lap chole. Since patient has dementia and high risk of developing delirium and agitation, will allow family to stay at night and after hours. On meropenem for suspected cholangitis.

## 2020-10-21 NOTE — ED Notes (Addendum)
Admitting provider Garba at bedside.

## 2020-10-21 NOTE — ED Notes (Signed)
Pt sleeping in stretcher, family at bedside. Wakes easily to verbal stimuli. Calm and cooperative. Offers no complaints at this time.

## 2020-10-21 NOTE — H&P (Signed)
History and Physical   Cory Hansen XBJ:[id number] DOB: [birth year] DOA: 10/20/2020  Referring MD/NP/PA: Dr. Lawrence Marseilles  PCP: Cory Broker, MD   Outpatient Specialists: None  Patient coming from: Home  Chief Complaint: Shortness of breath and confusion  HPI: Cory Hansen is a 87 y.o. male with medical history significant of dementia, atrial fibrillation, recurrent kidney stones, essential hypertension, osteoarthritis who was brought in by family secondary to some upper chest discomfort or shortness of breath and possible abdominal pain.  Symptoms started today and felt like indigestion.  He was also having difficulty with shortness of breath.  Patient has some elements of dementia and confusion.  Daughter is at bedside giving history.  He asked daughter to bring him to the hospital for evaluation.  Patient has apparently had similar episode before where he was found to have gallbladder sludge.  He was in the hospital few weeks ago.  Patient also has been lost some pounds of weight in the last few weeks due to recurrent nausea by daughter.  He has decreased oral intake.  He is hard of hearing.  Evaluation in the ER showed CBD stone and the diagnosis of choledocholithiasis is made via MRCP.  GI consulted to see patient in the morning Dr. Marca Ancona.  Patient is being admitted to the medical service for evaluation.  He is on chronic anticoagulation now with Eliquis after recent diagnosis of atrial fibrillation..  ED Course: Temperature is 99.9 blood pressure 85/47, pulse 101 respirate 30 oxygen sats 94% on room air.  Chemistry showed glucose of 106 alkaline phos of 449 lipase 47 AST 152 ALT 119.  White count is 11.4 hemoglobin 12.1.  Neah normal renal function.  Abdominal ultrasound showed progressive dilation of the proximal extrahepatic bile duct.  Suggestion of a distal obstructing lesion.  Subsequent MRCP shows choledocholithiasis with a 13 mm distal CBD stone.  The common bile duct measures  about 9 mm in diameter.  Patient is therefore being admitted for further evaluation and treatment and for possible ERCP.  Review of Systems: As per HPI otherwise 10 point review of systems negative.    Past Medical History:  Diagnosis Date   Arthritis    Atrial fibrillation (HCC)    Kidney stones    Shingles     Past Surgical History:  Procedure Laterality Date   AMPUTATION Left 02/22/2014   Procedure: INCISION AND DRAINAGE LEFT FOREFOOT AMPUTATION FIRST RAY;  Surgeon: Toni Arthurs, MD;  Location: [facility] OR;  Service: Orthopedics;  Laterality: Left;   APPENDECTOMY     EYE SURGERY Bilateral    cataract surgery w/ lens implant   HERNIA REPAIR     inguinal hernia repair     reports that he has never smoked. He has never used smokeless tobacco. He reports current alcohol use. He reports that he does not use drugs.  No Known Allergies  Family History  Problem Relation Age of Onset   Heart disease Neg Hx        No heart disease in parents     Prior to Admission medications   Medication Sig Start Date End Date Taking? Authorizing Provider  acetaminophen (TYLENOL) 500 MG tablet Take 500 mg by mouth 2 (two) times daily.   Yes [provider]  apixaban (ELIQUIS) 5 MG TABS tablet Take 1 tablet (5 mg total) by mouth 2 (two) times daily. 07/24/20  Yes Cory Broker, MD  docusate sodium (COLACE) 100 MG capsule Take 100 mg by mouth  daily as needed for mild constipation.   Yes [provider]  fluticasone (FLONASE) 50 MCG/ACT nasal spray Place 1 spray into both nostrils daily as needed for allergies.   Yes [provider]  furosemide (LASIX) 20 MG tablet Take 1 tablet (20 mg total) by mouth daily. 08/03/20  Yes Cory Broker, MD  hydroxypropyl methylcellulose / hypromellose (ISOPTO TEARS / GONIOVISC) 2.5 % ophthalmic solution Place into both eyes daily as needed for dry eyes.   Yes [provider]  magnesium citrate SOLN Take 1 Bottle by mouth  daily as needed for severe constipation or moderate constipation.   Yes [provider]  metoprolol tartrate (LOPRESSOR) 25 MG tablet Take 0.5 tablets (12.5 mg total) by mouth 2 (two) times daily. 07/24/20  Yes Cory Broker, MD  tamsulosin (FLOMAX) 0.4 MG CAPS capsule Take 0.4 mg by mouth at bedtime. 10/12/20  Yes [provider]  guaiFENesin-dextromethorphan (ROBITUSSIN DM) 100-10 MG/5ML syrup Take 5 mLs by mouth every 4 (four) hours as needed for cough (chest congestion). Patient not taking: No sig reported 03/20/20   Drema Dallas, MD  pantoprazole (PROTONIX) 40 MG tablet Take 1 tablet (40 mg total) by mouth daily. Patient not taking: No sig reported 04/18/20   Cory Broker, MD  polyethylene glycol (MIRALAX / GLYCOLAX) 17 g packet Take 17 g by mouth daily as needed for mild constipation. Patient not taking: No sig reported 06/29/20   Azucena Fallen, MD  vitamin B-12 (CYANOCOBALAMIN) 1000 MCG tablet Take 1 tablet (1,000 mcg total) by mouth daily. Patient not taking: No sig reported 10/14/14   Cory Broker, MD    Physical Exam: Vitals:   10/20/20 2100 10/20/20 2115 10/20/20 2130 10/20/20 2249  BP: 120/60 92/71 107/62 97/60  Pulse: 95 95 (!) 101 76  Resp: (!) 25 (!) 26 (!) 22 18  Temp:      TempSrc:      SpO2: 95% 96% 98% 96%      Constitutional: Confused, hard of hearing, no agitation Vitals:   10/20/20 2100 10/20/20 2115 10/20/20 2130 10/20/20 2249  BP: 120/60 92/71 107/62 97/60  Pulse: 95 95 (!) 101 76  Resp: (!) 25 (!) 26 (!) 22 18  Temp:      TempSrc:      SpO2: 95% 96% 98% 96%   Eyes: PERRL, lids and conjunctivae normal ENMT: Mucous membranes are dry. Posterior pharynx clear of any exudate or lesions.Normal dentition.  Neck: normal, supple, no masses, no thyromegaly Respiratory: clear to auscultation bilaterally, no wheezing, no crackles. Normal respiratory effort. No accessory muscle use.  Cardiovascular: Regular rate and  rhythm, no murmurs / rubs / gallops. No extremity edema. 2+ pedal pulses. No carotid bruits.  Abdomen: Positive epigastric tenderness, no masses palpated. No hepatosplenomegaly. Bowel sounds positive.  Musculoskeletal: no clubbing / cyanosis. No joint deformity upper and lower extremities. Good ROM, no contractures. Normal muscle tone.  Skin: no rashes, lesions, ulcers. No induration Neurologic: CN 2-12 grossly intact. Sensation intact, DTR normal. Strength 5/5 in all 4.  Psychiatric: Confused with mild agitation    Labs on Admission: I have personally reviewed following labs and imaging studies  CBC: Recent Labs  Lab 10/20/20 1651  WBC 11.4*  NEUTROABS 9.8*  HGB 12.1*  HCT 39.5  MCV 98.5  PLT 271   Basic Metabolic Panel: Recent Labs  Lab 10/20/20 1651  NA 139  K 4.3  CL 108  CO2 21*  GLUCOSE 106*  BUN  18  CREATININE 1.22  CALCIUM 9.4   GFR: CrCl cannot be calculated (Unknown ideal weight.). Liver Function Tests: Recent Labs  Lab 10/20/20 1651  AST 152*  ALT 119*  ALKPHOS 449*  BILITOT 2.8*  PROT 7.8  ALBUMIN 4.1   Recent Labs  Lab 10/20/20 1651  LIPASE 47   No results for input(s): AMMONIA in the last 168 hours. Coagulation Profile: No results for input(s): INR, PROTIME in the last 168 hours. Cardiac Enzymes: No results for input(s): CKTOTAL, CKMB, CKMBINDEX, TROPONINI in the last 168 hours. BNP (last 3 results) No results for input(s): PROBNP in the last 8760 hours. HbA1C: No results for input(s): HGBA1C in the last 72 hours. CBG: No results for input(s): GLUCAP in the last 168 hours. Lipid Profile: No results for input(s): CHOL, HDL, LDLCALC, TRIG, CHOLHDL, LDLDIRECT in the last 72 hours. Thyroid Function Tests: No results for input(s): TSH, T4TOTAL, FREET4, T3FREE, THYROIDAB in the last 72 hours. Anemia Panel: No results for input(s): VITAMINB12, FOLATE, FERRITIN, TIBC, IRON, RETICCTPCT in the last 72 hours. Urine analysis:    Component  Value Date/Time   COLORURINE YELLOW 06/30/2020 0305   APPEARANCEUR CLEAR 06/30/2020 0305   LABSPEC 1.019 06/30/2020 0305   PHURINE 6.0 06/30/2020 0305   GLUCOSEU NEGATIVE 06/30/2020 0305   GLUCOSEU NEGATIVE 10/31/2017 1407   HGBUR NEGATIVE 06/30/2020 0305   BILIRUBINUR NEGATIVE 06/30/2020 0305   KETONESUR NEGATIVE 06/30/2020 0305   PROTEINUR NEGATIVE 06/30/2020 0305   UROBILINOGEN 0.2 10/31/2017 1407   NITRITE NEGATIVE 06/30/2020 0305   LEUKOCYTESUR NEGATIVE 06/30/2020 0305   Sepsis Labs: @LABRCNTIP (procalcitonin:4,lacticidven:4) ) Recent Results (from the past 240 hour(s))  Resp Panel by RT-PCR (Flu A&B, Covid) Nasopharyngeal Swab     Status: None   Collection Time: 10/20/20  4:49 PM   Specimen: Nasopharyngeal Swab; Nasopharyngeal(NP) swabs in vial transport medium  Result Value Ref Range Status   SARS Coronavirus 2 by RT PCR NEGATIVE NEGATIVE Final    Comment: (NOTE) SARS-CoV-2 target nucleic acids are NOT DETECTED.  The SARS-CoV-2 RNA is generally detectable in upper respiratory specimens during the acute phase of infection. The lowest concentration of SARS-CoV-2 viral copies this assay can detect is 138 copies/mL. A negative result does not preclude SARS-Cov-2 infection and should not be used as the sole basis for treatment or other patient management decisions. A negative result may occur with  improper specimen collection/handling, submission of specimen other than nasopharyngeal swab, presence of viral mutation(s) within the areas targeted by this assay, and inadequate number of viral copies(<138 copies/mL). A negative result must be combined with clinical observations, patient history, and epidemiological information. The expected result is Negative.  Fact Sheet for Patients:  BloggerCourse.comhttps://www.fda.gov/media/152166/download  Fact Sheet for Healthcare Providers:  SeriousBroker.ithttps://www.fda.gov/media/152162/download  This test is no t yet approved or cleared by the [place]  FDA and  has been authorized for detection and/or diagnosis of SARS-CoV-2 by FDA under an Emergency Use Authorization (EUA). This EUA will remain  in effect (meaning this test can be used) for the duration of the COVID-19 declaration under Section 564(b)(1) of the Act, 21 U.S.C.section 360bbb-3(b)(1), unless the authorization is terminated  or revoked sooner.       Influenza A by PCR NEGATIVE NEGATIVE Final   Influenza B by PCR NEGATIVE NEGATIVE Final    Comment: (NOTE) The Xpert Xpress SARS-CoV-2/FLU/RSV plus assay is intended as an aid in the diagnosis of influenza from Nasopharyngeal swab specimens and should not be used as a sole basis for  treatment. Nasal washings and aspirates are unacceptable for Xpert Xpress SARS-CoV-2/FLU/RSV testing.  Fact Sheet for Patients: BloggerCourse.com  Fact Sheet for Healthcare Providers: SeriousBroker.it  This test is not yet approved or cleared by the [place] FDA and has been authorized for detection and/or diagnosis of SARS-CoV-2 by FDA under an Emergency Use Authorization (EUA). This EUA will remain in effect (meaning this test can be used) for the duration of the COVID-19 declaration under Section 564(b)(1) of the Act, 21 U.S.C. section 360bbb-3(b)(1), unless the authorization is terminated or revoked.  Performed at [facility], [address], [place], [place] [address]      Radiological Exams on Admission: DG Chest Portable 1 View  Result Date: 10/20/2020 CLINICAL DATA:  Chest pain EXAM: PORTABLE CHEST 1 VIEW COMPARISON:  06/30/2020, 08/28/2009 FINDINGS: Small right-sided pleural effusion. Bilateral calcified pleural plaques. Stable cardiomediastinal silhouette with aortic atherosclerosis. No pneumothorax IMPRESSION: 1. Small right-sided pleural effusion. 2. Calcified pleural plaques bilaterally Electronically Signed   By: Jasmine Pang M.D.   On: 10/20/2020  17:27   MR ABDOMEN MRCP W WO CONTAST  Result Date: 10/20/2020 CLINICAL DATA:  Dilated common duct, evaluate for choledocholithiasis EXAM: MRI ABDOMEN WITHOUT AND WITH CONTRAST (INCLUDING MRCP) TECHNIQUE: Multiplanar multisequence MR imaging of the abdomen was performed both before and after the administration of intravenous contrast. Heavily T2-weighted images of the biliary and pancreatic ducts were obtained, and three-dimensional MRCP images were rendered by post processing. CONTRAST:  8mL GADAVIST GADOBUTROL 1 MMOL/ML IV SOLN COMPARISON:  Right upper quadrant ultrasound dated 10/20/2020 FINDINGS: Motion degraded images. Lower chest: Lung bases are clear. Hepatobiliary: Liver is within normal limits. No suspicious/enhancing hepatic lesions. Gallbladder is mildly distended but grossly unremarkable. No cholelithiasis, gallbladder wall thickening, pericholecystic fluid, or inflammatory changes. No intrahepatic ductal dilatation. Common duct measures 9 mm, within the upper limits of normal. However, there is a 13 mm distal CBD stone (series 5/image 19). Pancreas:  Within normal limits. Spleen:  Within normal limits. Adrenals/Urinary Tract:  Adrenal glands are within normal limits. Multiple bilateral renal cysts, measuring up to 1.7 cm in the medial left upper kidney (series 14/image 22), benign (Bosniak I). Left renal sinus cysts. No hydronephrosis. Stomach/Bowel: Stomach is within normal limits. Visualized bowel is unremarkable. Vascular/Lymphatic:  No evidence of abdominal aortic aneurysm. No suspicious abdominal lymphadenopathy. Other:  No abdominal ascites. Musculoskeletal: Degenerative changes of the visualized thoracolumbar spine. IMPRESSION: Motion degraded images. Choledocholithiasis with a 13 mm distal CBD stone. Common duct measures 9 mm, within the upper limits of normal. No intrahepatic ductal dilatation. No cholelithiasis or associated inflammatory changes. Electronically Signed   By: Charline Bills M.D.   On: 10/20/2020 23:10   US Abdomen Limited RUQ (LIVER/GB)  Result Date: 10/20/2020 CLINICAL DATA:  Upper abdominal pain EXAM: ULTRASOUND ABDOMEN LIMITED RIGHT UPPER QUADRANT COMPARISON:  03/14/2020 FINDINGS: Gallbladder: No gallstones or wall thickening visualized. No sonographic Murphy sign noted by sonographer. Common bile duct: Diameter: 9 mm in proximal diameter, progressively dilated since prior examination. Liver: There is limited evaluation of the liver with obscuration of muscle the hepatic parenchyma due to overlying bowel gas, aerated lung, and osseous structures. The visualized hepatic parenchyma demonstrates normal echogenicity and echotexture. No intrahepatic mass is identified. Portal vein is patent on color Doppler imaging with normal direction of blood flow towards the liver. Other: No ascites IMPRESSION: Progressive dilation of the proximal extrahepatic bile duct. A distal obstructing lesion, such as choledocholithiasis, is not excluded. Correlation with liver enzymes is  recommended. If indicated, ERCP or MRCP examination may be more helpful for evaluation of the distal duct. Electronically Signed   By: Helyn Numbers MD   On: 10/20/2020 19:05    EKG: Independently reviewed.  Atrial fibrillation with no rapid response  Assessment/Plan Principal Problem:   Choledocholithiasis Active Problems:   Hyperlipidemia   NEOPLASM, MALIGNANT, BLADDER, HX OF   New onset atrial fibrillation (HCC)   CHF (congestive heart failure) (HCC)   Dementia (HCC)     #1 choledocholithiasis: Patient will be admitted.  Pain control, n.p.o., IV hydration, has chronic diastolic CHF but should be able to tolerate fluids.  GI already consulted.  We will hold Eliquis and defer to GI about timing of ERCP with possible stone removal or stenting.  Patient may also require laparoscopic cholecystectomy at some point.  #2 atrial fibrillation: We will continue rate control but hold Eliquis.  We will  use IV labetalol for now.  On metoprolol at home.  #3 chronic diastolic CHF: Appears compensated.  Has been on Lasix which will be on hold.  #4 dementia: Stable no agitation.  Patient apparently has hospital psychosis but does better with family around.  Recommends family staying with the patient in the hospital.  #5 history of bladder cancer: Not on active treatment.  He is on Flomax.  Resume when oral intake resumes.  #5 GERD: Continue with PPIs from home.  IV Protonix in place.   DVT prophylaxis: SCD Code Status: DNR Family Communication: Daughter at bedside Disposition Plan: To be determined Consults called: Dr. Marca Ancona, GI Admission status: Inpatient  Severity of Illness: The appropriate patient status for this patient is INPATIENT. Inpatient status is judged to be reasonable and necessary in order to provide the required intensity of service to ensure the patient's safety. The patient's presenting symptoms, physical exam findings, and initial radiographic and laboratory data in the context of their chronic comorbidities is felt to place them at high risk for further clinical deterioration. Furthermore, it is not anticipated that the patient will be medically stable for discharge from the hospital within 2 midnights of admission. The following factors support the patient status of inpatient.   " The patient's presenting symptoms include shortness of breath and abdominal pain. " The worrisome physical exam findings include confused with epigastric tenderness. " The initial radiographic and laboratory data are worrisome because of MRCP positive for choledocholithiasis. " The chronic co-morbidities include dementia and diastolic CHF.   * I certify that at the point of admission it is my clinical judgment that the patient will require inpatient hospital care spanning beyond 2 midnights from the point of admission due to high intensity of service, high risk for further deterioration and high  frequency of surveillance required.Lonia Blood MD [facility] Pager [phone]  If 7PM-7AM, please contact night-coverage [email or web address] Password [facility]  10/21/2020, 12:35 AM

## 2020-10-21 NOTE — ED Notes (Signed)
ED TO INPATIENT HANDOFF REPORT  Name/Age/Gender Cory Hansen 87 y.o. male  Code Status Code Status History    Date Active Date Inactive Code Status Order ID Comments User Context   06/19/2020 1925 06/29/2020 1702 DNR [id number]  Synetta FailMelvin, Alexander B, MD ED   03/14/2020 2300 03/20/2020 2258 Full Code [id number]  Rometta EmeryGarba, Mohammad L, MD ED   02/22/2014 1751 02/25/2014 1725 Full Code [id number]  Toni ArthursHewitt, John, MD Inpatient   02/11/2014 1232 02/14/2014 1837 Full Code [id number]  Abram SanderAdamo, Elena M, MD Inpatient    Questions for Most Recent Historical Code Status (Order [id number])    Question Answer   In the event of cardiac or respiratory ARREST Do not call a "code blue"   In the event of cardiac or respiratory ARREST Do not perform Intubation, CPR, defibrillation or ACLS   In the event of cardiac or respiratory ARREST Use medication by any route, position, wound care, and other measures to relive pain and suffering. May use oxygen, suction and manual treatment of airway obstruction as needed for comfort.        Advance Directive Documentation   Flowsheet Row Most Recent Value  Type of Advance Directive Out of facility DNR (pink MOST or yellow form)  Pre-existing out of facility DNR order (yellow form or pink MOST form) --  "MOST" Form in Place? --      Home/SNF/Other Home  Chief Complaint Choledocholithiasis [K80.50]  Level of Care/Admitting Diagnosis ED Disposition    ED Disposition  Admit   Condition  --   Comment  [facility] [[id number]]  Level of Care: Med-Surg [16]  [facility] if equivalent level of care is available:: Yes  Covid Evaluation: Asymptomatic Screening Protocol (No Symptoms)  Diagnosis: Choledocholithiasis [253664][171954]  Admitting Physician: Rometta EmeryGARBA, MOHAMMAD L [[id number]]  Attending Physician: Rometta EmeryGARBA, MOHAMMAD L [[id number]]  Estimated length of stay: past midnight tomorrow  Certification:: I certify this patient will  need inpatient services for at least 2 midnights         Medical History Past Medical History:  Diagnosis Date  . Arthritis   . Atrial fibrillation (HCC)   . Kidney stones   . Shingles     Allergies No Known Allergies  IV Location/Drains/Wounds Patient Lines/Drains/Airways Status    Active Line/Drains/Airways    Name Placement date Placement time Site Days   Peripheral IV 10/20/20 20 G 1" Right;Posterior Forearm 10/20/20  1645  Forearm  1          Labs/Imaging Results for orders placed or performed during the hospital encounter of 10/20/20 (from the past 48 hour(s))  Resp Panel by RT-PCR (Flu A&B, Covid) Nasopharyngeal Swab     Status: None   Collection Time: 10/20/20  4:49 PM   Specimen: Nasopharyngeal Swab; Nasopharyngeal(NP) swabs in vial transport medium  Result Value Ref Range   SARS Coronavirus 2 by RT PCR NEGATIVE NEGATIVE    Comment: (NOTE) SARS-CoV-2 target nucleic acids are NOT DETECTED.  The SARS-CoV-2 RNA is generally detectable in upper respiratory specimens during the acute phase of infection. The lowest concentration of SARS-CoV-2 viral copies this assay can detect is 138 copies/mL. A negative result does not preclude SARS-Cov-2 infection and should not be used as the sole basis for treatment or other patient management decisions. A negative result may occur with  improper specimen collection/handling, submission of specimen other than nasopharyngeal swab, presence of viral mutation(s) within the areas targeted by this  assay, and inadequate number of viral copies(<138 copies/mL). A negative result must be combined with clinical observations, patient history, and epidemiological information. The expected result is Negative.  Fact Sheet for Patients:  BloggerCourse.com  Fact Sheet for Healthcare Providers:  SeriousBroker.it  This test is no t yet approved or cleared by the [place] FDA and   has been authorized for detection and/or diagnosis of SARS-CoV-2 by FDA under an Emergency Use Authorization (EUA). This EUA will remain  in effect (meaning this test can be used) for the duration of the COVID-19 declaration under Section 564(b)(1) of the Act, 21 U.S.C.section 360bbb-3(b)(1), unless the authorization is terminated  or revoked sooner.       Influenza A by PCR NEGATIVE NEGATIVE   Influenza B by PCR NEGATIVE NEGATIVE    Comment: (NOTE) The Xpert Xpress SARS-CoV-2/FLU/RSV plus assay is intended as an aid in the diagnosis of influenza from Nasopharyngeal swab specimens and should not be used as a sole basis for treatment. Nasal washings and aspirates are unacceptable for Xpert Xpress SARS-CoV-2/FLU/RSV testing.  Fact Sheet for Patients: BloggerCourse.com  Fact Sheet for Healthcare Providers: SeriousBroker.it  This test is not yet approved or cleared by the [place] FDA and has been authorized for detection and/or diagnosis of SARS-CoV-2 by FDA under an Emergency Use Authorization (EUA). This EUA will remain in effect (meaning this test can be used) for the duration of the COVID-19 declaration under Section 564(b)(1) of the Act, 21 U.S.C. section 360bbb-3(b)(1), unless the authorization is terminated or revoked.  Performed at [facility], [address], [place], [place] [address]   Comprehensive metabolic panel     Status: Abnormal   Collection Time: 10/20/20  4:51 PM  Result Value Ref Range   Sodium 139 135 - 145 mmol/L   Potassium 4.3 3.5 - 5.1 mmol/L   Chloride 108 98 - 111 mmol/L   CO2 21 (L) 22 - 32 mmol/L   Glucose, Bld 106 (H) 70 - 99 mg/dL    Comment: Glucose reference range applies only to samples taken after fasting for at least 8 hours.   BUN 18 8 - 23 mg/dL   Creatinine, Ser 6.04 0.61 - 1.24 mg/dL   Calcium 9.4 8.9 - 54.0 mg/dL   Total Protein 7.8 6.5 - 8.1 g/dL    Albumin 4.1 3.5 - 5.0 g/dL   AST 981 (H) 15 - 41 U/L   ALT 119 (H) 0 - 44 U/L   Alkaline Phosphatase 449 (H) 38 - 126 U/L   Total Bilirubin 2.8 (H) 0.3 - 1.2 mg/dL   GFR, Estimated 54 (L) >60 mL/min    Comment: (NOTE) Calculated using the CKD-EPI Creatinine Equation (2021)    Anion gap 10 5 - 15    Comment: Performed at [facility], [address], [place], [place] [address]  Lipase, blood     Status: None   Collection Time: 10/20/20  4:51 PM  Result Value Ref Range   Lipase 47 11 - 51 U/L    Comment: Performed at [facility], [address], [place], [place] [address]  Troponin I (High Sensitivity)     Status: None   Collection Time: 10/20/20  4:51 PM  Result Value Ref Range   Troponin I (High Sensitivity) 7 <18 ng/L    Comment: (NOTE) Elevated high sensitivity troponin I (hsTnI) values and significant  changes across serial measurements may suggest ACS but many other  chronic and acute conditions  are known to elevate hsTnI results.  Refer to the "Links" section for chest pain algorithms and additional  guidance. Performed at [facility], [address] [place], [place] [address]   CBC with Differential     Status: Abnormal   Collection Time: 10/20/20  4:51 PM  Result Value Ref Range   WBC 11.4 (H) 4.0 - 10.5 K/uL   RBC 4.01 (L) 4.22 - 5.81 MIL/uL   Hemoglobin 12.1 (L) 13.0 - 17.0 g/dL   HCT 52.8 41.3 - 24.4 %   MCV 98.5 80.0 - 100.0 fL   MCH 30.2 26.0 - 34.0 pg   MCHC 30.6 30.0 - 36.0 g/dL   RDW 01.0 27.2 - 53.6 %   Platelets 271 150 - 400 K/uL   nRBC 0.0 0.0 - 0.2 %   Neutrophils Relative % 87 %   Neutro Abs 9.8 (H) 1.7 - 7.7 K/uL   Lymphocytes Relative 8 %   Lymphs Abs 0.9 0.7 - 4.0 K/uL   Monocytes Relative 4 %   Monocytes Absolute 0.5 0.1 - 1.0 K/uL   Eosinophils Relative 1 %   Eosinophils Absolute 0.1 0.0 - 0.5 K/uL   Basophils Relative 0 %   Basophils Absolute 0.0 0.0 - 0.1 K/uL   Immature  Granulocytes 0 %   Abs Immature Granulocytes 0.05 0.00 - 0.07 K/uL    Comment: Performed at [facility], [address] [place], [place] [address]  Troponin I (High Sensitivity)     Status: None   Collection Time: 10/20/20  6:48 PM  Result Value Ref Range   Troponin I (High Sensitivity) 7 <18 ng/L    Comment: (NOTE) Elevated high sensitivity troponin I (hsTnI) values and significant  changes across serial measurements may suggest ACS but many other  chronic and acute conditions are known to elevate hsTnI results.  Refer to the "Links" section for chest pain algorithms and additional  guidance. Performed at [facility], [address], [place], [place] [address]    DG Chest Portable 1 View  Result Date: 10/20/2020 CLINICAL DATA:  Chest pain EXAM: PORTABLE CHEST 1 VIEW COMPARISON:  06/30/2020, 08/28/2009 FINDINGS: Small right-sided pleural effusion. Bilateral calcified pleural plaques. Stable cardiomediastinal silhouette with aortic atherosclerosis. No pneumothorax IMPRESSION: 1. Small right-sided pleural effusion. 2. Calcified pleural plaques bilaterally Electronically Signed   By: Jasmine Pang M.D.   On: 10/20/2020 17:27   MR ABDOMEN MRCP W WO CONTAST  Result Date: 10/20/2020 CLINICAL DATA:  Dilated common duct, evaluate for choledocholithiasis EXAM: MRI ABDOMEN WITHOUT AND WITH CONTRAST (INCLUDING MRCP) TECHNIQUE: Multiplanar multisequence MR imaging of the abdomen was performed both before and after the administration of intravenous contrast. Heavily T2-weighted images of the biliary and pancreatic ducts were obtained, and three-dimensional MRCP images were rendered by post processing. CONTRAST:  76mL GADAVIST GADOBUTROL 1 MMOL/ML IV SOLN COMPARISON:  Right upper quadrant ultrasound dated 10/20/2020 FINDINGS: Motion degraded images. Lower chest: Lung bases are clear. Hepatobiliary: Liver is within normal limits. No suspicious/enhancing hepatic  lesions. Gallbladder is mildly distended but grossly unremarkable. No cholelithiasis, gallbladder wall thickening, pericholecystic fluid, or inflammatory changes. No intrahepatic ductal dilatation. Common duct measures 9 mm, within the upper limits of normal. However, there is a 13 mm distal CBD stone (series 5/image 19). Pancreas:  Within normal limits. Spleen:  Within normal limits. Adrenals/Urinary Tract:  Adrenal glands are within normal limits. Multiple bilateral renal cysts, measuring up to 1.7 cm in the medial left upper kidney (  series 14/image 22), benign (Bosniak I). Left renal sinus cysts. No hydronephrosis. Stomach/Bowel: Stomach is within normal limits. Visualized bowel is unremarkable. Vascular/Lymphatic:  No evidence of abdominal aortic aneurysm. No suspicious abdominal lymphadenopathy. Other:  No abdominal ascites. Musculoskeletal: Degenerative changes of the visualized thoracolumbar spine. IMPRESSION: Motion degraded images. Choledocholithiasis with a 13 mm distal CBD stone. Common duct measures 9 mm, within the upper limits of normal. No intrahepatic ductal dilatation. No cholelithiasis or associated inflammatory changes. Electronically Signed   By: Charline Bills M.D.   On: 10/20/2020 23:10   US Abdomen Limited RUQ (LIVER/GB)  Result Date: 10/20/2020 CLINICAL DATA:  Upper abdominal pain EXAM: ULTRASOUND ABDOMEN LIMITED RIGHT UPPER QUADRANT COMPARISON:  03/14/2020 FINDINGS: Gallbladder: No gallstones or wall thickening visualized. No sonographic Murphy sign noted by sonographer. Common bile duct: Diameter: 9 mm in proximal diameter, progressively dilated since prior examination. Liver: There is limited evaluation of the liver with obscuration of muscle the hepatic parenchyma due to overlying bowel gas, aerated lung, and osseous structures. The visualized hepatic parenchyma demonstrates normal echogenicity and echotexture. No intrahepatic mass is identified. Portal vein is patent on color  Doppler imaging with normal direction of blood flow towards the liver. Other: No ascites IMPRESSION: Progressive dilation of the proximal extrahepatic bile duct. A distal obstructing lesion, such as choledocholithiasis, is not excluded. Correlation with liver enzymes is recommended. If indicated, ERCP or MRCP examination may be more helpful for evaluation of the distal duct. Electronically Signed   By: Helyn Numbers MD   On: 10/20/2020 19:05    Pending Labs Unresulted Labs (From admission, onward)   None      Vitals/Pain Today's Vitals   10/20/20 2100 10/20/20 2115 10/20/20 2130 10/20/20 2249  BP: 120/60 92/71 107/62 97/60  Pulse: 95 95 (!) 101 76  Resp: (!) 25 (!) 26 (!) 22 18  Temp:      TempSrc:      SpO2: 95% 96% 98% 96%  PainSc:        Isolation Precautions No active isolations  Medications Medications  0.9 %  sodium chloride infusion ( Intravenous New Bag/Given 10/20/20 2351)  sodium chloride 0.9 % bolus 500 mL (0 mLs Intravenous Stopped 10/20/20 1741)  gadobutrol (GADAVIST) 1 MMOL/ML injection 8 mL (8 mLs Intravenous Contrast Given 10/20/20 2245)    Mobility walks with person assist

## 2020-10-21 NOTE — Anesthesia Preprocedure Evaluation (Deleted)
Anesthesia Evaluation    Airway        Dental   Pulmonary neg pulmonary ROS,           Cardiovascular +CHF  + dysrhythmias Atrial Fibrillation   New onset of A. Fib   Neuro/Psych PSYCHIATRIC DISORDERS Dementia  Neuromuscular disease    GI/Hepatic Elevated LFT's Choledocholithiasis    Endo/Other    Renal/GU Renal diseaseHx/o nephrolithiasid Bladder dysfunction  Prostate Ca Bladder Ca    Musculoskeletal  (+) Arthritis , Osteoarthritis,  Chronic bilateral LBP with radiculopathy   Abdominal   Peds  Hematology  (+) anemia , Eliquis therapy-last dose 7/15   Anesthesia Other Findings   Reproductive/Obstetrics                             Anesthesia Physical Anesthesia Plan  ASA: 4  Anesthesia Plan: General   Post-op Pain Management:    Induction: Intravenous  PONV Risk Score and Plan: 4 or greater and Treatment may vary due to age or medical condition and Propofol infusion  Airway Management Planned: Oral ETT  Additional Equipment:   Intra-op Plan:   Post-operative Plan: Extubation in OR  Informed Consent:   Patient has DNR.     Plan Discussed with:   Anesthesia Plan Comments:         Anesthesia Quick Evaluation

## 2020-10-21 NOTE — Consult Note (Signed)
[facility] Consult  Referring Provider: No ref. provider found Primary Care Physician:  [name], Elizabeth A, MD Primary Gastroenterologist: Previous pt of Dr.Hayes  Reason for Consultation: CBD stone  HPI: Cory Hansen is a 87 y.o. male admitted to the hospital with confusion and shortness of breath. Patient currently seems alert and oriented and states that he had pain on his left upper abdomen and left lower chest which seemed to improve only after raising both his hands above his head.  He denies fever, nausea or vomiting. Patient reports 5 to 6 pound weight loss in the last few weeks because of nausea. In the ER he had an MRCP which showed CBD stone. Patient is on Eliquis for atrial fibrillation, last dose yesterday morning, was found to be slightly hypotensive, minimally tachycardic on admission, with T bili of 2.8, AST 152, ALT 119, ALP of 449 on admission.  He had mild acidosis, bicarb 21, elevated creatinine 1.22 and low GFR of 54. He lives at home and has 5 daughters who take care of him on a daily basis 5 days a week. Patient currently denies any abdominal pain.  Patient was seen in 12/21 with transaminitis, had surgical evaluation at that point, recommended conservative management as symptoms not thought to be related to acute cholecystitis.  Past Medical History:  Diagnosis Date   Arthritis    Atrial fibrillation (HCC)    Kidney stones    Shingles     Past Surgical History:  Procedure Laterality Date   AMPUTATION Left 02/22/2014   Procedure: INCISION AND DRAINAGE LEFT FOREFOOT AMPUTATION FIRST RAY;  Surgeon: Toni ArthursJohn Hewitt, MD;  Location: [facility] OR;  Service: Orthopedics;  Laterality: Left;   APPENDECTOMY     EYE SURGERY Bilateral    cataract surgery w/ lens implant   HERNIA REPAIR     inguinal hernia repair    Prior to Admission medications   Medication Sig Start Date End Date Taking? Authorizing Provider  acetaminophen (TYLENOL) 500 MG tablet Take 500 mg  by mouth 2 (two) times daily.   Yes [provider]  apixaban (ELIQUIS) 5 MG TABS tablet Take 1 tablet (5 mg total) by mouth 2 (two) times daily. 07/24/20  Yes [name], Elizabeth A, MD  docusate sodium (COLACE) 100 MG capsule Take 100 mg by mouth daily as needed for mild constipation.   Yes [provider]  fluticasone (FLONASE) 50 MCG/ACT nasal spray Place 1 spray into both nostrils daily as needed for allergies.   Yes [provider]  furosemide (LASIX) 20 MG tablet Take 1 tablet (20 mg total) by mouth daily. 08/03/20  Yes [name], Elizabeth A, MD  hydroxypropyl methylcellulose / hypromellose (ISOPTO TEARS / GONIOVISC) 2.5 % ophthalmic solution Place into both eyes daily as needed for dry eyes.   Yes [provider]  magnesium citrate SOLN Take 1 Bottle by mouth daily as needed for severe constipation or moderate constipation.   Yes [provider]  metoprolol tartrate (LOPRESSOR) 25 MG tablet Take 0.5 tablets (12.5 mg total) by mouth 2 (two) times daily. 07/24/20  Yes [name], Elizabeth A, MD  tamsulosin (FLOMAX) 0.4 MG CAPS capsule Take 0.4 mg by mouth at bedtime. 10/12/20  Yes [provider]  guaiFENesin-dextromethorphan (ROBITUSSIN DM) 100-10 MG/5ML syrup Take 5 mLs by mouth every 4 (four) hours as needed for cough (chest congestion). Patient not taking: No sig reported 03/20/20   Drema DallasWoods, [name], MD  pantoprazole (PROTONIX) 40 MG tablet Take 1 tablet (40 mg total)  by mouth daily. Patient not taking: No sig reported 04/18/20   [name], MD  polyethylene glycol (MIRALAX / GLYCOLAX) 17 g packet Take 17 g by mouth daily as needed for mild constipation. Patient not taking: No sig reported 06/29/20   Azucena Fallen, MD  vitamin B-12 (CYANOCOBALAMIN) 1000 MCG tablet Take 1 tablet (1,000 mcg total) by mouth daily. Patient not taking: No sig reported 10/14/14   [name], MD    Current Facility-Administered Medications   Medication Dose Route Frequency Provider Last Rate Last Admin   0.9 %  sodium chloride infusion   Intravenous Continuous Pricilla Loveless, MD   Stopped at 10/21/20 0238   0.9 %  sodium chloride infusion   Intravenous Continuous Kerin Salen, MD 20 mL/hr at 10/21/20 0934 Restarted at 10/21/20 0934   dextrose 5 % in lactated ringers infusion   Intravenous Continuous Rometta Emery, MD 50 mL/hr at 10/21/20 0238 New Bag at 10/21/20 0238   ketorolac (TORADOL) 15 MG/ML injection 15 mg  15 mg Intravenous Q6H PRN Rometta Emery, MD       labetalol (NORMODYNE) injection 5 mg  5 mg Intravenous Q2H PRN Rometta Emery, MD       meropenem (MERREM) 1 g in sodium chloride 0.9 % 100 mL IVPB  1 g Intravenous Q12H Maurice March, [facility]   Stopped at 10/21/20 0238   ondansetron (ZOFRAN) tablet 4 mg  4 mg Oral Q6H PRN Rometta Emery, MD       Or   ondansetron (ZOFRAN) injection 4 mg  4 mg Intravenous Q6H PRN Rometta Emery, MD       pantoprazole (PROTONIX) injection 40 mg  40 mg Intravenous Q24H Earlie Lou L, MD   40 mg at 10/21/20 0237    Allergies as of 10/20/2020   (No Known Allergies)    Family History  Problem Relation Age of Onset   Heart disease Neg Hx        No heart disease in parents    Social History   Socioeconomic History   Marital status: Widowed    Spouse name: Not on file   Number of children: Not on file   Years of education: Not on file   Highest education level: Not on file  Occupational History   Not on file  Tobacco Use   Smoking status: Never   Smokeless tobacco: Never  Vaping Use   Vaping Use: Never used  Substance and Sexual Activity   Alcohol use: Yes    Alcohol/week: 0.0 standard drinks    Comment: very rare   Drug use: No   Sexual activity: Not on file  Other Topics Concern   Not on file  Social History Narrative   Not on file   Social Determinants of Health   Financial Resource Strain: Not on file  Food Insecurity: Not on file   Transportation Needs: Not on file  Physical Activity: Not on file  Stress: Not on file  Social Connections: Not on file  Intimate Partner Violence: Not on file    Review of Systems: Positive for: GI: Described in detail in HPI.    Gen:  involuntary weight loss, denies any fever, chills, rigors, night sweats, anorexia, fatigue, weakness, malaise, and sleep disorder CV: Denies chest pain, angina, palpitations, syncope, orthopnea, PND, peripheral edema, and claudication. Resp: Denies dyspnea, cough, sputum, wheezing, coughing up blood. GU : Denies urinary burning, blood in urine, urinary frequency, urinary hesitancy, nocturnal  urination, and urinary incontinence. MS: Denies joint pain or swelling.  Denies muscle weakness, cramps, atrophy.  Derm: Denies rash, itching, oral ulcerations, hives, unhealing ulcers.  Psych: Denies depression, anxiety, memory loss, suicidal ideation, hallucinations,  and confusion. Heme: Denies bruising, bleeding, and enlarged lymph nodes. Neuro:  Denies any headaches, dizziness, paresthesias. Endo:  Denies any problems with DM, thyroid, adrenal function.  Physical Exam: Vital signs in last 24 hours: Temp:  [98.4 F (36.9 C)-99.9 F (37.7 C)] 98.4 F (36.9 C) (07/16 0836) Pulse Rate:  [60-101] 76 (07/16 0836) Resp:  [12-30] 15 (07/16 0836) BP: (85-146)/(47-85) 146/70 (07/16 0836) SpO2:  [94 %-100 %] 98 % (07/16 0836)    General:   Elderly, hard of hearing Head:  Normocephalic and atraumatic. Eyes: Mild icterus,   conjunctiva pink. Ears:  Normal auditory acuity. Nose:  No deformity, discharge,  or lesions. Mouth:  No deformity or lesions.  Oropharynx pink & moist. Neck:  Supple; no masses or thyromegaly. Lungs:  Clear throughout to auscultation.   No wheezes, crackles, or rhonchi. No acute distress. Heart:  Regular rate and rhythm; no murmurs, clicks, rubs,  or gallops. Extremities:  Without clubbing or edema. Neurologic:  Alert and  oriented x4;   grossly normal neurologically. Skin:  Intact without significant lesions or rashes. Psych:  Alert and cooperative. Normal mood and affect. Abdomen:  Soft, nontender and nondistended. No masses, hepatosplenomegaly or hernias noted. Normal bowel sounds, without guarding, and without rebound.         Lab Results: Recent Labs    10/20/20 1651 10/21/20 0404  WBC 11.4* 16.0*  HGB 12.1* 10.4*  HCT 39.5 33.9*  PLT 271 196   BMET Recent Labs    10/20/20 1651 10/21/20 0404  NA 139 137  K 4.3 5.1  CL 108 108  CO2 21* 20*  GLUCOSE 106* 206*  BUN 18 17  CREATININE 1.22 0.89  CALCIUM 9.4 8.5*   LFT Recent Labs    10/21/20 0404  PROT 6.1*  ALBUMIN 3.1*  AST 137*  ALT 121*  ALKPHOS 335*  BILITOT 4.0*   PT/INR No results for input(s): LABPROT, INR in the last 72 hours.  Studies/Results: DG Chest Portable 1 View  Result Date: 10/20/2020 CLINICAL DATA:  Chest pain EXAM: PORTABLE CHEST 1 VIEW COMPARISON:  06/30/2020, 08/28/2009 FINDINGS: Small right-sided pleural effusion. Bilateral calcified pleural plaques. Stable cardiomediastinal silhouette with aortic atherosclerosis. No pneumothorax IMPRESSION: 1. Small right-sided pleural effusion. 2. Calcified pleural plaques bilaterally Electronically Signed   By: Jasmine Pang M.D.   On: 10/20/2020 17:27   MR ABDOMEN MRCP W WO CONTAST  Result Date: 10/20/2020 CLINICAL DATA:  Dilated common duct, evaluate for choledocholithiasis EXAM: MRI ABDOMEN WITHOUT AND WITH CONTRAST (INCLUDING MRCP) TECHNIQUE: Multiplanar multisequence MR imaging of the abdomen was performed both before and after the administration of intravenous contrast. Heavily T2-weighted images of the biliary and pancreatic ducts were obtained, and three-dimensional MRCP images were rendered by post processing. CONTRAST:  49mL GADAVIST GADOBUTROL 1 MMOL/ML IV SOLN COMPARISON:  Right upper quadrant ultrasound dated 10/20/2020 FINDINGS: Motion degraded images. Lower chest: Lung bases  are clear. Hepatobiliary: Liver is within normal limits. No suspicious/enhancing hepatic lesions. Gallbladder is mildly distended but grossly unremarkable. No cholelithiasis, gallbladder wall thickening, pericholecystic fluid, or inflammatory changes. No intrahepatic ductal dilatation. Common duct measures 9 mm, within the upper limits of normal. However, there is a 13 mm distal CBD stone (series 5/image 19). Pancreas:  Within normal limits. Spleen:  Within normal  limits. Adrenals/Urinary Tract:  Adrenal glands are within normal limits. Multiple bilateral renal cysts, measuring up to 1.7 cm in the medial left upper kidney (series 14/image 22), benign (Bosniak I). Left renal sinus cysts. No hydronephrosis. Stomach/Bowel: Stomach is within normal limits. Visualized bowel is unremarkable. Vascular/Lymphatic:  No evidence of abdominal aortic aneurysm. No suspicious abdominal lymphadenopathy. Other:  No abdominal ascites. Musculoskeletal: Degenerative changes of the visualized thoracolumbar spine. IMPRESSION: Motion degraded images. Choledocholithiasis with a 13 mm distal CBD stone. Common duct measures 9 mm, within the upper limits of normal. No intrahepatic ductal dilatation. No cholelithiasis or associated inflammatory changes. Electronically Signed   By: Charline Bills M.D.   On: 10/20/2020 23:10   US Abdomen Limited RUQ (LIVER/GB)  Result Date: 10/20/2020 CLINICAL DATA:  Upper abdominal pain EXAM: ULTRASOUND ABDOMEN LIMITED RIGHT UPPER QUADRANT COMPARISON:  03/14/2020 FINDINGS: Gallbladder: No gallstones or wall thickening visualized. No sonographic Murphy sign noted by sonographer. Common bile duct: Diameter: 9 mm in proximal diameter, progressively dilated since prior examination. Liver: There is limited evaluation of the liver with obscuration of muscle the hepatic parenchyma due to overlying bowel gas, aerated lung, and osseous structures. The visualized hepatic parenchyma demonstrates normal echogenicity  and echotexture. No intrahepatic mass is identified. Portal vein is patent on color Doppler imaging with normal direction of blood flow towards the liver. Other: No ascites IMPRESSION: Progressive dilation of the proximal extrahepatic bile duct. A distal obstructing lesion, such as choledocholithiasis, is not excluded. Correlation with liver enzymes is recommended. If indicated, ERCP or MRCP examination may be more helpful for evaluation of the distal duct. Electronically Signed   By: Helyn Numbers MD   On: 10/20/2020 19:05    Impression: 13 mm distal CBD stone noted on MRCP, common duct measuring 9 mm, no intrahepatic ductal dilatation Ultrasound previously had shown progressively dilated CBD T bili increased to 4 today, AST stable at 137, ALT 121, ALP 335 Mild acidosis Improved renal function Elevated WBC, 16 today Mild normocytic anemia  Plan: Had a detailed discussion regarding ERCP with the patient, and his daughter Harriett Sine present at bedside.  We discussed about the indication being CBD stone causing biliary obstruction and elevated LFTS and WBC.  We discussed about risks of the procedure, including bleeding, infection, perforation, pancreatitis, anesthesia risks and even death.  Patient received Eliquis yesterday morning.  Currently on IV Merrem.  Patient and daughter inquired about cholecystectomy, after ERCP if they agree for ERCP.  Patient and his daughter Harriett Sine, would want to talk to rest of the family members, discussed about risk and benefits of the procedure, and then decide if they want to proceed with ERCP.  If agreeable for ERCP, recommend surgical evaluation for cholecystectomy this admission.    LOS: 1 day   Kerin Salen, MD  10/21/2020, 12:26 PM

## 2020-10-22 ENCOUNTER — Encounter (HOSPITAL_COMMUNITY)
Admission: EM | Disposition: A | Discharge status: Home / Self Care | Payer: Self-pay | Source: Home / Self Care | Attending: Student

## 2020-10-22 DIAGNOSIS — I480 Paroxysmal atrial fibrillation: Secondary | ICD-10-CM

## 2020-10-22 DIAGNOSIS — I5032 Chronic diastolic (congestive) heart failure: Secondary | ICD-10-CM

## 2020-10-22 DIAGNOSIS — K8309 Other cholangitis: Secondary | ICD-10-CM

## 2020-10-22 DIAGNOSIS — A419 Sepsis, unspecified organism: Secondary | ICD-10-CM | POA: Diagnosis not present

## 2020-10-22 DIAGNOSIS — R6520 Severe sepsis without septic shock: Secondary | ICD-10-CM

## 2020-10-22 DIAGNOSIS — F0390 Unspecified dementia without behavioral disturbance: Secondary | ICD-10-CM

## 2020-10-22 DIAGNOSIS — Z8551 Personal history of malignant neoplasm of bladder: Secondary | ICD-10-CM

## 2020-10-22 DIAGNOSIS — K8050 Calculus of bile duct without cholangitis or cholecystitis without obstruction: Secondary | ICD-10-CM | POA: Diagnosis not present

## 2020-10-22 DIAGNOSIS — R748 Abnormal levels of other serum enzymes: Secondary | ICD-10-CM

## 2020-10-22 LAB — CBC WITH DIFFERENTIAL/PLATELET
Abs Immature Granulocytes: 0.02 10*3/uL (ref 0.00–0.07)
Basophils Absolute: 0 10*3/uL (ref 0.0–0.1)
Basophils Relative: 1 %
Eosinophils Absolute: 0.2 10*3/uL (ref 0.0–0.5)
Eosinophils Relative: 3 %
HCT: 31.7 % — ABNORMAL LOW (ref 39.0–52.0)
Hemoglobin: 9.8 g/dL — ABNORMAL LOW (ref 13.0–17.0)
Immature Granulocytes: 0 %
Lymphocytes Relative: 21 %
Lymphs Abs: 1.3 10*3/uL (ref 0.7–4.0)
MCH: 30.1 pg (ref 26.0–34.0)
MCHC: 30.9 g/dL (ref 30.0–36.0)
MCV: 97.2 fL (ref 80.0–100.0)
Monocytes Absolute: 0.8 10*3/uL (ref 0.1–1.0)
Monocytes Relative: 13 %
Neutro Abs: 3.9 10*3/uL (ref 1.7–7.7)
Neutrophils Relative %: 62 %
Platelets: 203 10*3/uL (ref 150–400)
RBC: 3.26 MIL/uL — ABNORMAL LOW (ref 4.22–5.81)
RDW: 15.1 % (ref 11.5–15.5)
WBC: 6.3 10*3/uL (ref 4.0–10.5)
nRBC: 0 % (ref 0.0–0.2)

## 2020-10-22 LAB — PHOSPHORUS: Phosphorus: 3.5 mg/dL (ref 2.5–4.6)

## 2020-10-22 LAB — HEPARIN LEVEL (UNFRACTIONATED)
Heparin Unfractionated: 0.97 IU/mL — ABNORMAL HIGH (ref 0.30–0.70)
Heparin Unfractionated: 0.82 IU/mL — ABNORMAL HIGH (ref 0.30–0.70)

## 2020-10-22 LAB — COMPREHENSIVE METABOLIC PANEL
ALT: 97 U/L — ABNORMAL HIGH (ref 0–44)
AST: 83 U/L — ABNORMAL HIGH (ref 15–41)
Albumin: 3 g/dL — ABNORMAL LOW (ref 3.5–5.0)
Alkaline Phosphatase: 290 U/L — ABNORMAL HIGH (ref 38–126)
Anion gap: 5 (ref 5–15)
BUN: 16 mg/dL (ref 8–23)
CO2: 24 mmol/L (ref 22–32)
Calcium: 8.7 mg/dL — ABNORMAL LOW (ref 8.9–10.3)
Chloride: 109 mmol/L (ref 98–111)
Creatinine, Ser: 0.92 mg/dL (ref 0.61–1.24)
GFR, Estimated: >60 mL/min (ref >60)
Glucose, Bld: 92 mg/dL (ref 70–99)
Potassium: 4.4 mmol/L (ref 3.5–5.1)
Sodium: 138 mmol/L (ref 135–145)
Total Bilirubin: 4.1 mg/dL — ABNORMAL HIGH (ref 0.3–1.2)
Total Protein: 5.9 g/dL — ABNORMAL LOW (ref 6.5–8.1)

## 2020-10-22 LAB — MAGNESIUM: Magnesium: 2.2 mg/dL (ref 1.7–2.4)

## 2020-10-22 LAB — APTT
aPTT: 30 seconds (ref 24–36)
aPTT: 62 seconds — ABNORMAL HIGH (ref 24–36)

## 2020-10-22 SURGERY — ENDOSCOPIC RETROGRADE CHOLANGIOPANCREATOGRAPHY (ERCP) WITH PROPOFOL
Anesthesia: General

## 2020-10-22 MED ORDER — HYPROMELLOSE (GONIOSCOPIC) 2.5 % OP SOLN: 1.0000 [drp] | Freq: Every day | OPHTHALMIC | Status: DC | PRN

## 2020-10-22 MED ORDER — HEPARIN (PORCINE) 25000 UT/250ML-% IV SOLN
1050.0000 [IU]/h | INTRAVENOUS | Status: DC
  Administered 2020-10-22: 1000 [IU]/h via INTRAVENOUS
  Administered 2020-10-23: 1050 [IU]/h via INTRAVENOUS
  Filled 2020-10-22 (×4): qty 250

## 2020-10-22 MED ORDER — POLYVINYL ALCOHOL 1.4 % OP SOLN
1.0000 [drp] | Freq: Every day | OPHTHALMIC | Status: DC | PRN
  Filled 2020-10-22: qty 15

## 2020-10-22 MED ORDER — METOPROLOL TARTRATE 12.5 MG HALF TABLET
12.5000 mg | ORAL_TABLET | Freq: Two times a day (BID) | ORAL | Status: DC
  Administered 2020-10-22 – 2020-10-25 (×6): 12.5 mg via ORAL
  Filled 2020-10-22 (×6): qty 1

## 2020-10-22 NOTE — Progress Notes (Signed)
ANTICOAGULATION CONSULT NOTE  Pharmacy Consult for heparin Indication: hx atrial fibrillation (home Eliquis on hold)  No Known Allergies  Patient Measurements: height 71 inches, weight 69 kg    Heparin Dosing Weight: 69 kg  Vital Signs: Temp: 97.6 F (36.4 C) (07/17 0447) BP: 122/67 (07/17 0447) Pulse Rate: 71 (07/17 0447)  Labs: Recent Labs    10/20/20 1651 10/20/20 1848 10/21/20 0404 10/22/20 0535  HGB 12.1*  --  10.4* 9.8*  HCT 39.5  --  33.9* 31.7*  PLT 271  --  196 203  CREATININE 1.22  --  0.89 0.92  TROPONINIHS 7 7  --   --     CrCl cannot be calculated (Unknown ideal weight.).   Medications:  - on Eliquis 5 mg bid PTA (last dose  taken on 10/20/20 at 8a)  Assessment: Patient is a 87 y.o M with hx afib on Eliquis PTA, presented to the ED on 10/20/20 with c/o CP, SOB and abdominal pain.  Abdominal MRI on 7/15 showed choledocholithiasis with a distal CBD stone.  Patient and family currently do not want to pursue ERCP or cholecystectomy.  Pharmacy has been consulted to dose heparin drip, while Eliquis is on hold, in case invasive intervention is needed.   Goal of Therapy:  Heparin level 0.3-0.7 units/ml aPTT 66-102 seconds Monitor platelets by anticoagulation protocol: Yes   Plan:  - baseline heparin level and aPTT now --> aPTT 30 sec, heparin level is elevated at 0.97 but this is likely from residual effect of Eliquis - heparin drip at 1000 units/hr - check 8 hr heparin level and aPTT - monitor for s/sx bleeding  Dorna Leitz P 10/22/2020,1:20 PM

## 2020-10-22 NOTE — Progress Notes (Signed)
ANTICOAGULATION CONSULT NOTE  Pharmacy Consult for heparin Indication: hx atrial fibrillation (home Eliquis on hold)  No Known Allergies  Patient Measurements: height 71 inches, weight 69 kg  Weight: 68.7 kg (151 lb 7.3 oz) Heparin Dosing Weight: 69 kg  Vital Signs: Temp: 97.4 F (36.3 C) (07/17 2008) Temp Source: Oral (07/17 2008) BP: 143/74 (07/17 2008) Pulse Rate: 70 (07/17 2008)  Labs: Recent Labs    10/20/20 1651 10/20/20 1848 10/21/20 0404 10/22/20 0535 10/22/20 1441 10/22/20 2309  HGB 12.1*  --  10.4* 9.8*  --   --   HCT 39.5  --  33.9* 31.7*  --   --   PLT 271  --  196 203  --   --   APTT  --   --   --   --  30 62*  HEPARINUNFRC  --   --   --   --  0.97* 0.82*  CREATININE 1.22  --  0.89 0.92  --   --   TROPONINIHS 7 7  --   --   --   --      Estimated Creatinine Clearance: 44.6 mL/min (by C-G formula based on SCr of 0.92 mg/dL).   Medications:  - on Eliquis 5 mg bid PTA (last dose  taken on 10/20/20 at 8a)  Assessment: Patient is a 87 y.o M with hx afib on Eliquis PTA, presented to the ED on 10/20/20 with c/o CP, SOB and abdominal pain.  Abdominal MRI on 7/15 showed choledocholithiasis with a distal CBD stone.  Patient and family currently do not want to pursue ERCP or cholecystectomy.  Pharmacy has been consulted to dose heparin drip, while Eliquis is on hold, in case invasive intervention is needed.  aPTT 62 subtherapeutic on 1000 units/hr heparin level is elevated at 0.82 but this is likely from residual effect of Eliquis No bleeding or line interruptions per RN  Goal of Therapy:  Heparin level 0.3-0.7 units/ml aPTT 66-102 seconds Monitor platelets by anticoagulation protocol: Yes   Plan:  - increase heparin drip to 1150 units/hr - check 8 hr heparin level and aPTT - monitor for s/sx bleeding  Arley Phenix RPh 10/22/2020, 11:52 PM

## 2020-10-22 NOTE — Progress Notes (Signed)
Subjective: Patient was seen and examined at bedside in presence of his eldest daughter, Cory Hansen and Engineer, civil (consulting). He reports being completely pain-free. He is more worried about leg pain. Denies nausea or vomiting.  Objective: Vital signs in last 24 hours: Temp:  [97.6 F (36.4 C)-98.1 F (36.7 C)] 97.6 F (36.4 C) (07/17 0447) Pulse Rate:  [69-75] 71 (07/17 0447) Resp:  [16-19] 18 (07/17 0447) BP: (118-133)/(64-70) 122/67 (07/17 0447) SpO2:  [96 %-99 %] 97 % (07/17 0447) Weight change:     PE: Elderly, slightly hard of hearing GENERAL: Not in distress, mild icterus, mild pallor  ABDOMEN: Soft, nondistended, nontender, normoactive bowel sounds EXTREMITIES: No deformity, no edema  Lab Results: Results for orders placed or performed during the hospital encounter of 10/20/20 (from the past 48 hour(s))  Resp Panel by RT-PCR (Flu A&B, Covid) Nasopharyngeal Swab     Status: None   Collection Time: 10/20/20  4:49 PM   Specimen: Nasopharyngeal Swab; Nasopharyngeal(NP) swabs in vial transport medium  Result Value Ref Range   SARS Coronavirus 2 by RT PCR NEGATIVE NEGATIVE    Comment: (NOTE) SARS-CoV-2 target nucleic acids are NOT DETECTED.  The SARS-CoV-2 RNA is generally detectable in upper respiratory specimens during the acute phase of infection. The lowest concentration of SARS-CoV-2 viral copies this assay can detect is 138 copies/mL. A negative result does not preclude SARS-Cov-2 infection and should not be used as the sole basis for treatment or other patient management decisions. A negative result may occur with  improper specimen collection/handling, submission of specimen other than nasopharyngeal swab, presence of viral mutation(s) within the areas targeted by this assay, and inadequate number of viral copies(<138 copies/mL). A negative result must be combined with clinical observations, patient history, and epidemiological information. The expected result is  Negative.  Fact Sheet for Patients:  BloggerCourse.com  Fact Sheet for Healthcare Providers:  SeriousBroker.it  This test is no t yet approved or cleared by the [place] FDA and  has been authorized for detection and/or diagnosis of SARS-CoV-2 by FDA under an Emergency Use Authorization (EUA). This EUA will remain  in effect (meaning this test can be used) for the duration of the COVID-19 declaration under Section 564(b)(1) of the Act, 21 U.S.C.section 360bbb-3(b)(1), unless the authorization is terminated  or revoked sooner.       Influenza A by PCR NEGATIVE NEGATIVE   Influenza B by PCR NEGATIVE NEGATIVE    Comment: (NOTE) The Xpert Xpress SARS-CoV-2/FLU/RSV plus assay is intended as an aid in the diagnosis of influenza from Nasopharyngeal swab specimens and should not be used as a sole basis for treatment. Nasal washings and aspirates are unacceptable for Xpert Xpress SARS-CoV-2/FLU/RSV testing.  Fact Sheet for Patients: BloggerCourse.com  Fact Sheet for Healthcare Providers: SeriousBroker.it  This test is not yet approved or cleared by the [place] FDA and has been authorized for detection and/or diagnosis of SARS-CoV-2 by FDA under an Emergency Use Authorization (EUA). This EUA will remain in effect (meaning this test can be used) for the duration of the COVID-19 declaration under Section 564(b)(1) of the Act, 21 U.S.C. section 360bbb-3(b)(1), unless the authorization is terminated or revoked.  Performed at [facility], [address], [place], [place] [address]   Comprehensive metabolic panel     Status: Abnormal   Collection Time: 10/20/20  4:51 PM  Result Value Ref Range   Sodium 139 135 - 145 mmol/L   Potassium 4.3 3.5 - 5.1 mmol/L   Chloride  108 98 - 111 mmol/L   CO2 21 (L) 22 - 32 mmol/L   Glucose, Bld 106 (H) 70 - 99 mg/dL     Comment: Glucose reference range applies only to samples taken after fasting for at least 8 hours.   BUN 18 8 - 23 mg/dL   Creatinine, Ser 9.16 0.61 - 1.24 mg/dL   Calcium 9.4 8.9 - 38.4 mg/dL   Total Protein 7.8 6.5 - 8.1 g/dL   Albumin 4.1 3.5 - 5.0 g/dL   AST 665 (H) 15 - 41 U/L   ALT 119 (H) 0 - 44 U/L   Alkaline Phosphatase 449 (H) 38 - 126 U/L   Total Bilirubin 2.8 (H) 0.3 - 1.2 mg/dL   GFR, Estimated 54 (L) >60 mL/min    Comment: (NOTE) Calculated using the CKD-EPI Creatinine Equation (2021)    Anion gap 10 5 - 15    Comment: Performed at [facility], [address], [place], [place] [address]  Lipase, blood     Status: None   Collection Time: 10/20/20  4:51 PM  Result Value Ref Range   Lipase 47 11 - 51 U/L    Comment: Performed at [facility], [address], [place], [place] [address]  Troponin I (High Sensitivity)     Status: None   Collection Time: 10/20/20  4:51 PM  Result Value Ref Range   Troponin I (High Sensitivity) 7 <18 ng/L    Comment: (NOTE) Elevated high sensitivity troponin I (hsTnI) values and significant  changes across serial measurements may suggest ACS but many other  chronic and acute conditions are known to elevate hsTnI results.  Refer to the "Links" section for chest pain algorithms and additional  guidance. Performed at [facility], [address], [place], [place] [address]   CBC with Differential     Status: Abnormal   Collection Time: 10/20/20  4:51 PM  Result Value Ref Range   WBC 11.4 (H) 4.0 - 10.5 K/uL   RBC 4.01 (L) 4.22 - 5.81 MIL/uL   Hemoglobin 12.1 (L) 13.0 - 17.0 g/dL   HCT 09.2 33.0 - 07.6 %   MCV 98.5 80.0 - 100.0 fL   MCH 30.2 26.0 - 34.0 pg   MCHC 30.6 30.0 - 36.0 g/dL   RDW 22.6 33.3 - 54.5 %   Platelets 271 150 - 400 K/uL   nRBC 0.0 0.0 - 0.2 %   Neutrophils Relative % 87 %   Neutro Abs 9.8 (H) 1.7 - 7.7 K/uL   Lymphocytes Relative 8 %   Lymphs Abs 0.9 0.7  - 4.0 K/uL   Monocytes Relative 4 %   Monocytes Absolute 0.5 0.1 - 1.0 K/uL   Eosinophils Relative 1 %   Eosinophils Absolute 0.1 0.0 - 0.5 K/uL   Basophils Relative 0 %   Basophils Absolute 0.0 0.0 - 0.1 K/uL   Immature Granulocytes 0 %   Abs Immature Granulocytes 0.05 0.00 - 0.07 K/uL    Comment: Performed at [facility], [address] [place], [place] [address]  Troponin I (High Sensitivity)     Status: None   Collection Time: 10/20/20  6:48 PM  Result Value Ref Range   Troponin I (High Sensitivity) 7 <18 ng/L    Comment: (NOTE) Elevated high sensitivity troponin I (hsTnI) values and significant  changes across serial measurements may suggest ACS but many other  chronic and acute conditions are known to elevate hsTnI results.  Refer to the "Links" section for chest pain algorithms and additional  guidance. Performed at [facility], [address], [place], [place] [address]   Comprehensive metabolic panel     Status: Abnormal   Collection Time: 10/21/20  4:04 AM  Result Value Ref Range   Sodium 137 135 - 145 mmol/L   Potassium 5.1 3.5 - 5.1 mmol/L   Chloride 108 98 - 111 mmol/L   CO2 20 (L) 22 - 32 mmol/L   Glucose, Bld 206 (H) 70 - 99 mg/dL    Comment: Glucose reference range applies only to samples taken after fasting for at least 8 hours.   BUN 17 8 - 23 mg/dL   Creatinine, Ser 1.91 0.61 - 1.24 mg/dL   Calcium 8.5 (L) 8.9 - 10.3 mg/dL   Total Protein 6.1 (L) 6.5 - 8.1 g/dL   Albumin 3.1 (L) 3.5 - 5.0 g/dL   AST 478 (H) 15 - 41 U/L   ALT 121 (H) 0 - 44 U/L   Alkaline Phosphatase 335 (H) 38 - 126 U/L   Total Bilirubin 4.0 (H) 0.3 - 1.2 mg/dL   GFR, Estimated >29 >56 mL/min    Comment: (NOTE) Calculated using the CKD-EPI Creatinine Equation (2021)    Anion gap 9 5 - 15    Comment: Performed at [facility], [address], [place], [place] [address]  CBC     Status: Abnormal   Collection Time: 10/21/20   4:04 AM  Result Value Ref Range   WBC 16.0 (H) 4.0 - 10.5 K/uL   RBC 3.40 (L) 4.22 - 5.81 MIL/uL   Hemoglobin 10.4 (L) 13.0 - 17.0 g/dL   HCT 65.7 (L) 84.6 - 96.2 %   MCV 99.7 80.0 - 100.0 fL   MCH 30.6 26.0 - 34.0 pg   MCHC 30.7 30.0 - 36.0 g/dL   RDW 95.2 84.1 - 32.4 %   Platelets 196 150 - 400 K/uL   nRBC 0.0 0.0 - 0.2 %    Comment: Performed at [facility], [address] [place], [place] [address]  CBC with Differential/Platelet     Status: Abnormal   Collection Time: 10/22/20  5:35 AM  Result Value Ref Range   WBC 6.3 4.0 - 10.5 K/uL   RBC 3.26 (L) 4.22 - 5.81 MIL/uL   Hemoglobin 9.8 (L) 13.0 - 17.0 g/dL   HCT 72.5 (L) 36.6 - 44.0 %   MCV 97.2 80.0 - 100.0 fL   MCH 30.1 26.0 - 34.0 pg   MCHC 30.9 30.0 - 36.0 g/dL   RDW 34.7 42.5 - 95.6 %   Platelets 203 150 - 400 K/uL   nRBC 0.0 0.0 - 0.2 %   Neutrophils Relative % 62 %   Neutro Abs 3.9 1.7 - 7.7 K/uL   Lymphocytes Relative 21 %   Lymphs Abs 1.3 0.7 - 4.0 K/uL   Monocytes Relative 13 %   Monocytes Absolute 0.8 0.1 - 1.0 K/uL   Eosinophils Relative 3 %   Eosinophils Absolute 0.2 0.0 - 0.5 K/uL   Basophils Relative 1 %   Basophils Absolute 0.0 0.0 - 0.1 K/uL   Immature Granulocytes 0 %   Abs Immature Granulocytes 0.02 0.00 - 0.07 K/uL    Comment: Performed at [facility], [address] [place], [place] [address]  Comprehensive metabolic panel     Status: Abnormal   Collection Time: 10/22/20  5:35 AM  Result Value Ref  Range   Sodium 138 135 - 145 mmol/L   Potassium 4.4 3.5 - 5.1 mmol/L   Chloride 109 98 - 111 mmol/L   CO2 24 22 - 32 mmol/L   Glucose, Bld 92 70 - 99 mg/dL    Comment: Glucose reference range applies only to samples taken after fasting for at least 8 hours.   BUN 16 8 - 23 mg/dL   Creatinine, Ser 2.99 0.61 - 1.24 mg/dL   Calcium 8.7 (L) 8.9 - 10.3 mg/dL   Total Protein 5.9 (L) 6.5 - 8.1 g/dL   Albumin 3.0 (L) 3.5 - 5.0 g/dL   AST 83 (H) 15 - 41 U/L   ALT  97 (H) 0 - 44 U/L   Alkaline Phosphatase 290 (H) 38 - 126 U/L   Total Bilirubin 4.1 (H) 0.3 - 1.2 mg/dL   GFR, Estimated >24 >26 mL/min    Comment: (NOTE) Calculated using the CKD-EPI Creatinine Equation (2021)    Anion gap 5 5 - 15    Comment: Performed at [facility], [address], [place], [place] [address]  Magnesium     Status: None   Collection Time: 10/22/20  5:35 AM  Result Value Ref Range   Magnesium 2.2 1.7 - 2.4 mg/dL    Comment: Performed at [facility], [address] [place], [place] [address]  Phosphorus     Status: None   Collection Time: 10/22/20  5:35 AM  Result Value Ref Range   Phosphorus 3.5 2.5 - 4.6 mg/dL    Comment: Performed at [facility], [address], [place], [place] [address]    Studies/Results: DG Chest Portable 1 View  Result Date: 10/20/2020 CLINICAL DATA:  Chest pain EXAM: PORTABLE CHEST 1 VIEW COMPARISON:  06/30/2020, 08/28/2009 FINDINGS: Small right-sided pleural effusion. Bilateral calcified pleural plaques. Stable cardiomediastinal silhouette with aortic atherosclerosis. No pneumothorax IMPRESSION: 1. Small right-sided pleural effusion. 2. Calcified pleural plaques bilaterally Electronically Signed   By: Jasmine Pang M.D.   On: 10/20/2020 17:27   MR ABDOMEN MRCP W WO CONTAST  Result Date: 10/20/2020 CLINICAL DATA:  Dilated common duct, evaluate for choledocholithiasis EXAM: MRI ABDOMEN WITHOUT AND WITH CONTRAST (INCLUDING MRCP) TECHNIQUE: Multiplanar multisequence MR imaging of the abdomen was performed both before and after the administration of intravenous contrast. Heavily T2-weighted images of the biliary and pancreatic ducts were obtained, and three-dimensional MRCP images were rendered by post processing. CONTRAST:  33mL GADAVIST GADOBUTROL 1 MMOL/ML IV SOLN COMPARISON:  Right upper quadrant ultrasound dated 10/20/2020 FINDINGS: Motion degraded images. Lower chest: Lung bases are  clear. Hepatobiliary: Liver is within normal limits. No suspicious/enhancing hepatic lesions. Gallbladder is mildly distended but grossly unremarkable. No cholelithiasis, gallbladder wall thickening, pericholecystic fluid, or inflammatory changes. No intrahepatic ductal dilatation. Common duct measures 9 mm, within the upper limits of normal. However, there is a 13 mm distal CBD stone (series 5/image 19). Pancreas:  Within normal limits. Spleen:  Within normal limits. Adrenals/Urinary Tract:  Adrenal glands are within normal limits. Multiple bilateral renal cysts, measuring up to 1.7 cm in the medial left upper kidney (series 14/image 22), benign (Bosniak I). Left renal sinus cysts. No hydronephrosis. Stomach/Bowel: Stomach is within normal limits. Visualized bowel is unremarkable. Vascular/Lymphatic:  No evidence of abdominal aortic aneurysm. No suspicious abdominal lymphadenopathy. Other:  No abdominal ascites. Musculoskeletal: Degenerative changes of the visualized thoracolumbar spine. IMPRESSION: Motion degraded images. Choledocholithiasis with a 13 mm distal CBD stone. Common duct measures 9 mm,  within the upper limits of normal. No intrahepatic ductal dilatation. No cholelithiasis or associated inflammatory changes. Electronically Signed   By: Charline BillsSriyesh  Krishnan M.D.   On: 10/20/2020 23:10   US Abdomen Limited RUQ (LIVER/GB)  Result Date: 10/20/2020 CLINICAL DATA:  Upper abdominal pain EXAM: ULTRASOUND ABDOMEN LIMITED RIGHT UPPER QUADRANT COMPARISON:  03/14/2020 FINDINGS: Gallbladder: No gallstones or wall thickening visualized. No sonographic Murphy sign noted by sonographer. Common bile duct: Diameter: 9 mm in proximal diameter, progressively dilated since prior examination. Liver: There is limited evaluation of the liver with obscuration of muscle the hepatic parenchyma due to overlying bowel gas, aerated lung, and osseous structures. The visualized hepatic parenchyma demonstrates normal echogenicity and  echotexture. No intrahepatic mass is identified. Portal vein is patent on color Doppler imaging with normal direction of blood flow towards the liver. Other: No ascites IMPRESSION: Progressive dilation of the proximal extrahepatic bile duct. A distal obstructing lesion, such as choledocholithiasis, is not excluded. Correlation with liver enzymes is recommended. If indicated, ERCP or MRCP examination may be more helpful for evaluation of the distal duct. Electronically Signed   By: Helyn NumbersAshesh  Parikh MD   On: 10/20/2020 19:05    Medications: I have reviewed the patient's current medications.  Assessment: Distal CBD stone, 13 mm noted on MRCP Elevated LFTs-T bili 4.1/AST 83/ALT 97/ALP 290, trending down from admission Leukocytosis improved from 16-6.3  Plan: Patient was initially scheduled for an ERCP with CBD stone removal today morning, however yesterday afternoon the patient and his daughter Cory Sineancy present at bedside decided not to undergo ERCP or pursue cholecystectomy.  I had a detailed discussion regarding the same with the patient and his daughter Cory Hashimotoatricia at bedside today.  Discussed about risk of ascending cholangitis and sepsis with retained CBD stone.  Due to his age, patient does not want to undergo endoscopic procedure or surgery and reports being completely pain-free, wanting to advance his diet. Patient wants to continue IV antibiotics, hopefully transition to oral antibiotics and discharged home if possible.  Will change diet to full liquid.   Cory SalenArya Montanna Mcbain, MD 10/22/2020, 11:25 AM

## 2020-10-22 NOTE — Progress Notes (Signed)
PROGRESS NOTE  Cory Hansen KGO:[id number] DOB: 27-Feb-1921   PCP: Hoyt Koch, MD  Patient is from: Home  DOA: 10/20/2020 LOS: 2  Chief complaints:  Chief Complaint  Patient presents with   Chest Pain     Brief Narrative / Interim history: 87 year old M with history of dementia, A. Fib on Eliquis, diastolic CHF, nephrolithiasis, HTN and osteoarthritis presenting with chest discomfort, shortness of breath and abdominal pain, and admitted for possible cholangitis in the setting of acute choledocholithiasis measuring about 13 mm with CBD to 9 mm on MRCP.  He was a started on IV meropenem.  GI consulted and entertained ERCP but patient and family refused ERCP or other surgical intervention.    Subjective: Seen and examined earlier this morning.  No major events overnight of this morning.  No complaints.  Denies nausea, vomiting, abdominal pain, chest pain, shortness of breath, diarrhea or UTI symptoms.  One of patient's daughter at bedside.  Objective: Vitals:   10/21/20 2022 10/22/20 0447 10/22/20 1348 10/22/20 1350  BP: 121/66 122/67 111/64   Pulse: 69 71    Resp: '18 18 18   ' Temp: 97.7 F (36.5 C) 97.6 F (36.4 C) 98.1 F (36.7 C)   TempSrc:   Oral   SpO2: 97% 97% 99%   Weight:    68.7 kg    Intake/Output Summary (Last 24 hours) at 10/22/2020 1518 Last data filed at 10/22/2020 0925 Gross per 24 hour  Intake 510 ml  Output 0 ml  Net 510 ml   Filed Weights   10/22/20 1350  Weight: 68.7 kg    Examination:  GENERAL: No apparent distress.  Nontoxic. HEENT: MMM.  Vision and hearing grossly intact.  NECK: Supple.  No apparent JVD.  RESP: On RA no IWOB.  Fair aeration bilaterally. CVS:  RRR. Heart sounds normal.  ABD/GI/GU: BS+. Abd soft, NTND.  MSK/EXT:  Moves extremities. No apparent deformity. No edema.  SKIN: no apparent skin lesion or wound NEURO: Awake, alert and oriented appropriately.  No apparent focal neuro deficit. PSYCH: Calm. Normal affect.    Procedures:  None  Microbiology summarized: YELYH-90 and influenza PCR nonreactive.  Assessment & Plan: Severe sepsis due to acute cholangitis in the setting of choledocholithiasis-POA.  Patient met criteria on presentation with leukocytosis, tachycardia, tachypnea and hypotension.  Sepsis physiology resolved. -Patient refused ERCP or any surgical intervention.  GI started full liquid diet -Continue IV meropenem.  -Likely discharge home on p.o. Augmentin if remains stable and continues to tolerate diet. -Recheck LFT and lipase in the morning -Discontinue Toradol and detailed now he is on full liquid diet -Decrease IVF to 50 cc an hour  Elevated LFTs/hyperbilirubinemia: Due to #1.  Improved. Recent Labs  Lab 10/20/20 1651 10/21/20 0404 10/22/20 0535  AST 152* 137* 83*  ALT 119* 121* 97*  ALKPHOS 449* 335* 290*  BILITOT 2.8* 4.0* 4.1*  PROT 7.8 6.1* 5.9*  ALBUMIN 4.1 3.1* 3.0*  -Recheck in the morning   Paroxysmal A. fib: Rate controlled.  On metoprolol and Eliquis at home. -Resume home metoprolol.  Discontinue as needed labetalol. -Continue heparin gtt. We will resume Eliquis on discharge   Chronic diastolic CHF: Limited TTE in 06/2020 with LVEF of 55 to 60%, RVSP of 44.7.  Appears compensated.  On Lasix at home. -Continue holding Lasix -Decrease IVF to 50 cc an hour -Monitor fluid and respiratory status while on IV fluid.  Dementia without behavioral disturbance: Seems to be fairly oriented -Reorientation and delirium precautions  History of bladder cancer/BPH-no LUTS. -Continue home Flomax  GERD:  -Continue Protonix  Body mass index is 21.12 kg/m.         DVT prophylaxis:  SCDs Start: 10/21/20 0134  Code Status: DNR/DNI Family Communication: Patient and/or RN.  Updated patient's daughter at bedside Level of care: Telemetry Status is: Inpatient  Remains inpatient appropriate because:IV treatments appropriate due to intensity of illness or inability to  take PO and Inpatient level of care appropriate due to severity of illness  Dispo: The patient is from: Home              Anticipated d/c is to: Home              Patient currently is not medically stable to d/c.   Difficult to place patient No       Consultants:  Gastroenterology   Sch Meds:  Scheduled Meds:  feeding supplement  1 Container Oral TID BM   pantoprazole (PROTONIX) IV  40 mg Intravenous Q24H   Continuous Infusions:  sodium chloride Stopped (10/21/20 0238)   sodium chloride 20 mL/hr at 10/21/20 0934   dextrose 5% lactated ringers 50 mL/hr at 10/21/20 0238   heparin 1,000 Units/hr (10/22/20 1513)   meropenem (MERREM) IV 1 g (10/22/20 1250)   PRN Meds:.ketorolac, labetalol, ondansetron **OR** ondansetron (ZOFRAN) IV  Antimicrobials: Anti-infectives (From admission, onward)    Start     Dose/Rate Route Frequency Ordered Stop   10/21/20 0130  meropenem (MERREM) 1 g in sodium chloride 0.9 % 100 mL IVPB        1 g 200 mL/hr over 30 Minutes Intravenous Every 12 hours 10/21/20 0048          I have personally reviewed the following labs and images: CBC: Recent Labs  Lab 10/20/20 1651 10/21/20 0404 10/22/20 0535  WBC 11.4* 16.0* 6.3  NEUTROABS 9.8*  --  3.9  HGB 12.1* 10.4* 9.8*  HCT 39.5 33.9* 31.7*  MCV 98.5 99.7 97.2  PLT 271 196 203   BMP &GFR Recent Labs  Lab 10/20/20 1651 10/21/20 0404 10/22/20 0535  NA 139 137 138  K 4.3 5.1 4.4  CL 108 108 109  CO2 21* 20* 24  GLUCOSE 106* 206* 92  BUN '18 17 16  ' CREATININE 1.22 0.89 0.92  CALCIUM 9.4 8.5* 8.7*  MG  --   --  2.2  PHOS  --   --  3.5   Estimated Creatinine Clearance: 44.6 mL/min (by C-G formula based on SCr of 0.92 mg/dL). Liver & Pancreas: Recent Labs  Lab 10/20/20 1651 10/21/20 0404 10/22/20 0535  AST 152* 137* 83*  ALT 119* 121* 97*  ALKPHOS 449* 335* 290*  BILITOT 2.8* 4.0* 4.1*  PROT 7.8 6.1* 5.9*  ALBUMIN 4.1 3.1* 3.0*   Recent Labs  Lab 10/20/20 1651  LIPASE 47    No results for input(s): AMMONIA in the last 168 hours. Diabetic: No results for input(s): HGBA1C in the last 72 hours. No results for input(s): GLUCAP in the last 168 hours. Cardiac Enzymes: No results for input(s): CKTOTAL, CKMB, CKMBINDEX, TROPONINI in the last 168 hours. No results for input(s): PROBNP in the last 8760 hours. Coagulation Profile: No results for input(s): INR, PROTIME in the last 168 hours. Thyroid Function Tests: No results for input(s): TSH, T4TOTAL, FREET4, T3FREE, THYROIDAB in the last 72 hours. Lipid Profile: No results for input(s): CHOL, HDL, LDLCALC, TRIG, CHOLHDL, LDLDIRECT in the last 72 hours. Anemia Panel: No results for  input(s): VITAMINB12, FOLATE, FERRITIN, TIBC, IRON, RETICCTPCT in the last 72 hours. Urine analysis:    Component Value Date/Time   COLORURINE YELLOW 06/30/2020 0305   APPEARANCEUR CLEAR 06/30/2020 0305   LABSPEC 1.019 06/30/2020 0305   PHURINE 6.0 06/30/2020 0305   GLUCOSEU NEGATIVE 06/30/2020 0305   GLUCOSEU NEGATIVE 10/31/2017 1407   HGBUR NEGATIVE 06/30/2020 0305   BILIRUBINUR NEGATIVE 06/30/2020 0305   KETONESUR NEGATIVE 06/30/2020 0305   PROTEINUR NEGATIVE 06/30/2020 0305   UROBILINOGEN 0.2 10/31/2017 1407   NITRITE NEGATIVE 06/30/2020 0305   LEUKOCYTESUR NEGATIVE 06/30/2020 0305   Sepsis Labs: Invalid input(s): PROCALCITONIN, [place]  Microbiology: Recent Results (from the past 240 hour(s))  Resp Panel by RT-PCR (Flu A&B, Covid) Nasopharyngeal Swab     Status: None   Collection Time: 10/20/20  4:49 PM   Specimen: Nasopharyngeal Swab; Nasopharyngeal(NP) swabs in vial transport medium  Result Value Ref Range Status   SARS Coronavirus 2 by RT PCR NEGATIVE NEGATIVE Final    Comment: (NOTE) SARS-CoV-2 target nucleic acids are NOT DETECTED.  The SARS-CoV-2 RNA is generally detectable in upper respiratory specimens during the acute phase of infection. The lowest concentration of SARS-CoV-2 viral copies this assay  can detect is 138 copies/mL. A negative result does not preclude SARS-Cov-2 infection and should not be used as the sole basis for treatment or other patient management decisions. A negative result may occur with  improper specimen collection/handling, submission of specimen other than nasopharyngeal swab, presence of viral mutation(s) within the areas targeted by this assay, and inadequate number of viral copies(<138 copies/mL). A negative result must be combined with clinical observations, patient history, and epidemiological information. The expected result is Negative.  Fact Sheet for Patients:  EntrepreneurPulse.com.au  Fact Sheet for Healthcare Providers:  IncredibleEmployment.be  This test is no t yet approved or cleared by the [place] FDA and  has been authorized for detection and/or diagnosis of SARS-CoV-2 by FDA under an Emergency Use Authorization (EUA). This EUA will remain  in effect (meaning this test can be used) for the duration of the COVID-19 declaration under Section 564(b)(1) of the Act, 21 U.S.C.section 360bbb-3(b)(1), unless the authorization is terminated  or revoked sooner.       Influenza A by PCR NEGATIVE NEGATIVE Final   Influenza B by PCR NEGATIVE NEGATIVE Final    Comment: (NOTE) The Xpert Xpress SARS-CoV-2/FLU/RSV plus assay is intended as an aid in the diagnosis of influenza from Nasopharyngeal swab specimens and should not be used as a sole basis for treatment. Nasal washings and aspirates are unacceptable for Xpert Xpress SARS-CoV-2/FLU/RSV testing.  Fact Sheet for Patients: EntrepreneurPulse.com.au  Fact Sheet for Healthcare Providers: IncredibleEmployment.be  This test is not yet approved or cleared by the [place] FDA and has been authorized for detection and/or diagnosis of SARS-CoV-2 by FDA under an Emergency Use Authorization (EUA). This EUA will  remain in effect (meaning this test can be used) for the duration of the COVID-19 declaration under Section 564(b)(1) of the Act, 21 U.S.C. section 360bbb-3(b)(1), unless the authorization is terminated or revoked.  Performed at [facility], [place] [address], [place], [place] [address]     Radiology Studies: No results found.      Javaya [place] T. [place]  If 7PM-7AM, please contact night-coverage [email or web address] 10/22/2020, 3:18 PM

## 2020-10-23 DIAGNOSIS — A419 Sepsis, unspecified organism: Secondary | ICD-10-CM | POA: Diagnosis not present

## 2020-10-23 DIAGNOSIS — K8050 Calculus of bile duct without cholangitis or cholecystitis without obstruction: Secondary | ICD-10-CM | POA: Diagnosis not present

## 2020-10-23 DIAGNOSIS — Z8551 Personal history of malignant neoplasm of bladder: Secondary | ICD-10-CM | POA: Diagnosis not present

## 2020-10-23 DIAGNOSIS — K8309 Other cholangitis: Secondary | ICD-10-CM | POA: Diagnosis not present

## 2020-10-23 LAB — HEPARIN LEVEL (UNFRACTIONATED)
Heparin Unfractionated: 1.06 IU/mL — ABNORMAL HIGH (ref 0.30–0.70)
Heparin Unfractionated: 0.63 IU/mL (ref 0.30–0.70)

## 2020-10-23 LAB — APTT
aPTT: 95 seconds — ABNORMAL HIGH (ref 24–36)
aPTT: 99 seconds — ABNORMAL HIGH (ref 24–36)

## 2020-10-23 LAB — CBC
HCT: 32.8 % — ABNORMAL LOW (ref 39.0–52.0)
Hemoglobin: 10.5 g/dL — ABNORMAL LOW (ref 13.0–17.0)
MCH: 30.3 pg (ref 26.0–34.0)
MCHC: 32 g/dL (ref 30.0–36.0)
MCV: 94.8 fL (ref 80.0–100.0)
Platelets: 151 10*3/uL (ref 150–400)
RBC: 3.46 MIL/uL — ABNORMAL LOW (ref 4.22–5.81)
RDW: 15.2 % (ref 11.5–15.5)
WBC: 5.4 10*3/uL (ref 4.0–10.5)
nRBC: 0 % (ref 0.0–0.2)

## 2020-10-23 LAB — COMPREHENSIVE METABOLIC PANEL
ALT: 91 U/L — ABNORMAL HIGH (ref 0–44)
AST: 84 U/L — ABNORMAL HIGH (ref 15–41)
Albumin: 2.9 g/dL — ABNORMAL LOW (ref 3.5–5.0)
Alkaline Phosphatase: 298 U/L — ABNORMAL HIGH (ref 38–126)
Anion gap: 10 (ref 5–15)
BUN: 12 mg/dL (ref 8–23)
CO2: 20 mmol/L — ABNORMAL LOW (ref 22–32)
Calcium: 9 mg/dL (ref 8.9–10.3)
Chloride: 109 mmol/L (ref 98–111)
Creatinine, Ser: 0.88 mg/dL (ref 0.61–1.24)
GFR, Estimated: >60 mL/min (ref >60)
Glucose, Bld: 95 mg/dL (ref 70–99)
Potassium: 4.3 mmol/L (ref 3.5–5.1)
Sodium: 139 mmol/L (ref 135–145)
Total Bilirubin: 2.3 mg/dL — ABNORMAL HIGH (ref 0.3–1.2)
Total Protein: 5.9 g/dL — ABNORMAL LOW (ref 6.5–8.1)

## 2020-10-23 LAB — PHOSPHORUS
Phosphorus: 2.8 mg/dL (ref 2.5–4.6)
Phosphorus: 2.9 mg/dL (ref 2.5–4.6)

## 2020-10-23 LAB — MAGNESIUM
Magnesium: 2 mg/dL (ref 1.7–2.4)
Magnesium: 2 mg/dL (ref 1.7–2.4)

## 2020-10-23 LAB — LIPASE, BLOOD: Lipase: 30 U/L (ref 11–51)

## 2020-10-23 MED ORDER — SODIUM CHLORIDE 0.9 % IV SOLN
3.0000 g | Freq: Four times a day (QID) | INTRAVENOUS | Status: DC
  Administered 2020-10-23 – 2020-10-26 (×11): 3 g via INTRAVENOUS
  Filled 2020-10-23 (×4): qty 3
  Filled 2020-10-23 (×4): qty 8
  Filled 2020-10-23 (×2): qty 3
  Filled 2020-10-23: qty 8
  Filled 2020-10-23 (×2): qty 3
  Filled 2020-10-23: qty 8

## 2020-10-23 MED ORDER — ADULT MULTIVITAMIN W/MINERALS CH
1.0000 | ORAL_TABLET | Freq: Every day | ORAL | Status: DC
  Administered 2020-10-24 – 2020-10-26 (×2): 1 via ORAL
  Filled 2020-10-23 (×2): qty 1

## 2020-10-23 MED ORDER — ENSURE ENLIVE PO LIQD
237.0000 mL | Freq: Three times a day (TID) | ORAL | Status: DC
  Administered 2020-10-23 – 2020-10-24 (×5): 237 mL via ORAL

## 2020-10-23 NOTE — Consult Note (Signed)
Yamen Castrogiovanni Pinn 23-Oct-1920  [id number].    Requesting MD: Dr. Therisa Doyne Chief Complaint/Reason for Consult: Choledocholithiasis   HPI: Cory Hansen is a 87 y.o. male w/ a hx of A. Fib on Eliquis, CHF, HTN who presented with chest discomfort, abdominal pain and sob. Patient reports prior to admission he was having central lower chest pain, epigastric pain and sob. He felt like when he ate things were getting stuck in his throat and he would become nauseated. Denies fever, or emesis at home. He underwent workup and was found to have Elevated wbc (16), LFT's (Alk phos 335, AST 137, ALT 121, T. Bili 4.0) and Choledocholithiasis on imaging. There were no gallstones visualized within the GB on RUQ [place] or MRCP. There was no Cholecystitis visualized on imaging. He was admitted to [facility] started on abx and GI was consulted. GI planning for ERCP and asked [place] to see.   Patient reports hx of prior appendectomy and inguinal hernia repair. He lives at home alone but his 5 daughters rotate and provide 24 hour care. He walks at baseline with a walker. Currently he denies any abdominal pain, n/v and was tolerating a liquid diet this am. Since admission wbc has normlaized (5.4) and LFT's are downtrending (Alk phos 298, AST 84, ALT 91, T. Bili 2.3)  ROS: Review of Systems  Constitutional:  Negative for chills and fever.  Cardiovascular:  Positive for chest pain.  Gastrointestinal:  Positive for abdominal pain and nausea. Negative for vomiting.  All other systems reviewed and are negative.  Family History  Problem Relation Age of Onset   Heart disease Neg Hx        No heart disease in parents    Past Medical History:  Diagnosis Date   Arthritis    Atrial fibrillation ([place])    Kidney stones    Shingles     Past Surgical History:  Procedure Laterality Date   AMPUTATION Left 02/22/2014   Procedure: INCISION AND DRAINAGE LEFT FOREFOOT AMPUTATION FIRST RAY;  Surgeon: Wylene Simmer, MD;  Location: [facility];   Service: Orthopedics;  Laterality: Left;   APPENDECTOMY     EYE SURGERY Bilateral    cataract surgery w/ lens implant   HERNIA REPAIR     inguinal hernia repair    Social History:  reports that he has never smoked. He has never used smokeless tobacco. He reports current alcohol use. He reports that he does not use drugs.  Allergies: No Known Allergies  Medications Prior to Admission  Medication Sig Dispense Refill   acetaminophen (TYLENOL) 500 MG tablet Take 500 mg by mouth 2 (two) times daily.     apixaban (ELIQUIS) 5 MG TABS tablet Take 1 tablet (5 mg total) by mouth 2 (two) times daily. 180 tablet 1   docusate sodium (COLACE) 100 MG capsule Take 100 mg by mouth daily as needed for mild constipation.     fluticasone (FLONASE) 50 MCG/ACT nasal spray Place 1 spray into both nostrils daily as needed for allergies.     furosemide (LASIX) 20 MG tablet Take 1 tablet (20 mg total) by mouth daily. 90 tablet 3   hydroxypropyl methylcellulose / hypromellose (ISOPTO TEARS / GONIOVISC) 2.5 % ophthalmic solution Place into both eyes daily as needed for dry eyes.     magnesium citrate SOLN Take 1 Bottle by mouth daily as needed for severe constipation or moderate constipation.     metoprolol tartrate (LOPRESSOR) 25 MG tablet Take 0.5 tablets (12.5  mg total) by mouth 2 (two) times daily. 180 tablet 0   tamsulosin (FLOMAX) 0.4 MG CAPS capsule Take 0.4 mg by mouth at bedtime.       Physical Exam: Blood pressure 104/66, pulse 77, temperature 97.9 F (36.6 C), temperature source Oral, resp. rate 18, weight 68.7 kg, SpO2 96 %. General: pleasant, WD/WN elderly white male who appears in NAD HEENT: head is normocephalic, atraumatic.  Sclera are noninjected.  PERRL.  Ears and nose without any masses or lesions.  Mouth is pink and moist. Dentures upper and lower Heart: Irregular rhythm with regular rate  Normal s1,s2. No obvious murmurs, gallops, or rubs noted.  Palpable radial pulses bilaterally  Lungs:  CTAB, no wheezes, rhonchi, or rales noted.  Respiratory effort nonlabored Abd: Soft, NT/ND, +BS, no masses, hernias, or organomegaly. Prior RLQ appendectomy scar well healed MS: no BUE/BLE edema, calves soft and nontender Skin: warm and dry with no masses, lesions, or rashes Psych: A&Ox3 with an appropriate affect Neuro: cranial nerves grossly intact, normal speech, thought process intact, moves all extremities, gait not assessed   Results for orders placed or performed during the hospital encounter of 10/20/20 (from the past 48 hour(s))  CBC with Differential/Platelet     Status: Abnormal   Collection Time: 10/22/20  5:35 AM  Result Value Ref Range   WBC 6.3 4.0 - 10.5 K/uL   RBC 3.26 (L) 4.22 - 5.81 MIL/uL   Hemoglobin 9.8 (L) 13.0 - 17.0 g/dL   HCT 31.7 (L) 39.0 - 52.0 %   MCV 97.2 80.0 - 100.0 fL   MCH 30.1 26.0 - 34.0 pg   MCHC 30.9 30.0 - 36.0 g/dL   RDW 15.1 11.5 - 15.5 %   Platelets 203 150 - 400 K/uL   nRBC 0.0 0.0 - 0.2 %   Neutrophils Relative % 62 %   Neutro Abs 3.9 1.7 - 7.7 K/uL   Lymphocytes Relative 21 %   Lymphs Abs 1.3 0.7 - 4.0 K/uL   Monocytes Relative 13 %   Monocytes Absolute 0.8 0.1 - 1.0 K/uL   Eosinophils Relative 3 %   Eosinophils Absolute 0.2 0.0 - 0.5 K/uL   Basophils Relative 1 %   Basophils Absolute 0.0 0.0 - 0.1 K/uL   Immature Granulocytes 0 %   Abs Immature Granulocytes 0.02 0.00 - 0.07 K/uL    Comment: Performed at [facility], [place] [address], [place], [place] [address]  Comprehensive metabolic panel     Status: Abnormal   Collection Time: 10/22/20  5:35 AM  Result Value Ref Range   Sodium 138 135 - 145 mmol/L   Potassium 4.4 3.5 - 5.1 mmol/L   Chloride 109 98 - 111 mmol/L   CO2 24 22 - 32 mmol/L   Glucose, Bld 92 70 - 99 mg/dL    Comment: Glucose reference range applies only to samples taken after fasting for at least 8 hours.   BUN 16 8 - 23 mg/dL   Creatinine, Ser 0.92 0.61 - 1.24 mg/dL   Calcium 8.7 (L) 8.9 - 10.3  mg/dL   Total Protein 5.9 (L) 6.5 - 8.1 g/dL   Albumin 3.0 (L) 3.5 - 5.0 g/dL   AST 83 (H) 15 - 41 U/L   ALT 97 (H) 0 - 44 U/L   Alkaline Phosphatase 290 (H) 38 - 126 U/L   Total Bilirubin 4.1 (H) 0.3 - 1.2 mg/dL   GFR, Estimated >60 >60 mL/min    Comment: (NOTE) Calculated  using the CKD-EPI Creatinine Equation (2021)    Anion gap 5 5 - 15    Comment: Performed at [facility], [place] [address] [place], [place] [address]  Magnesium     Status: None   Collection Time: 10/22/20  5:35 AM  Result Value Ref Range   Magnesium 2.2 1.7 - 2.4 mg/dL    Comment: Performed at [facility], [place] [address] [place], [place] [address]  Phosphorus     Status: None   Collection Time: 10/22/20  5:35 AM  Result Value Ref Range   Phosphorus 3.5 2.5 - 4.6 mg/dL    Comment: Performed at [facility], [place] [address], [place], [place] [address]  Heparin level (unfractionated)     Status: Abnormal   Collection Time: 10/22/20  2:41 PM  Result Value Ref Range   Heparin Unfractionated 0.97 (H) 0.30 - 0.70 IU/mL    Comment: (NOTE) The clinical reportable range upper limit is being lowered to >1.10 to align with the FDA approved guidance for the current laboratory assay.  If heparin results are below expected values, and patient dosage has  been confirmed, suggest follow up testing of antithrombin III levels. Performed at [facility], [place] [address] [place], [place] [address]   APTT     Status: None   Collection Time: 10/22/20  2:41 PM  Result Value Ref Range   aPTT 30 24 - 36 seconds    Comment: Performed at [facility], [place] [address], [place], [place] [address]  Heparin level (unfractionated)     Status: Abnormal   Collection Time: 10/22/20 11:09 PM  Result Value Ref Range   Heparin Unfractionated 0.82 (H) 0.30 - 0.70 IU/mL    Comment: (NOTE) The clinical reportable range upper limit is being lowered  to >1.10 to align with the FDA approved guidance for the current laboratory assay.  If heparin results are below expected values, and patient dosage has  been confirmed, suggest follow up testing of antithrombin III levels. Performed at [facility], [place] [address], [place], [place] [address]   APTT     Status: Abnormal   Collection Time: 10/22/20 11:09 PM  Result Value Ref Range   aPTT 62 (H) 24 - 36 seconds    Comment:        IF BASELINE aPTT IS ELEVATED, SUGGEST PATIENT RISK ASSESSMENT BE USED TO DETERMINE APPROPRIATE ANTICOAGULANT THERAPY. Performed at [facility], [place] [address] [place],  [address]   Comprehensive metabolic panel     Status: Abnormal   Collection Time: 10/23/20  4:41 AM  Result Value Ref Range   Sodium 139 135 - 145 mmol/L   Potassium 4.3 3.5 - 5.1 mmol/L   Chloride 109 98 - 111 mmol/L   CO2 20 (L) 22 - 32 mmol/L   Glucose, Bld 95 70 - 99 mg/dL    Comment: Glucose reference range applies only to samples taken after fasting for at least 8 hours.   BUN 12 8 - 23 mg/dL   Creatinine, Ser 0.88 0.61 - 1.24 mg/dL   Calcium 9.0 8.9 - 10.3 mg/dL   Total Protein 5.9 (L) 6.5 - 8.1 g/dL   Albumin 2.9 (L) 3.5 - 5.0 g/dL   AST 84 (H) 15 - 41 U/L   ALT 91 (H) 0 - 44 U/L   Alkaline Phosphatase 298 (H) 38 - 126 U/L   Total Bilirubin 2.3 (H) 0.3 -  1.2 mg/dL   GFR, Estimated >60 >60 mL/min    Comment: (NOTE) Calculated using the CKD-EPI Creatinine Equation (2021)    Anion gap 10 5 - 15    Comment: Performed at [facility], [place] [address], [place], [place] [address]  CBC     Status: Abnormal   Collection Time: 10/23/20  4:41 AM  Result Value Ref Range   WBC 5.4 4.0 - 10.5 K/uL   RBC 3.46 (L) 4.22 - 5.81 MIL/uL   Hemoglobin 10.5 (L) 13.0 - 17.0 g/dL   HCT 32.8 (L) 39.0 - 52.0 %   MCV 94.8 80.0 - 100.0 fL   MCH 30.3 26.0 - 34.0 pg   MCHC 32.0 30.0 - 36.0 g/dL   RDW 15.2 11.5 - 15.5 %   Platelets  151 150 - 400 K/uL   nRBC 0.0 0.0 - 0.2 %    Comment: Performed at [facility], [place] [address], [place], [place] [address]  Lipase, blood     Status: None   Collection Time: 10/23/20  4:41 AM  Result Value Ref Range   Lipase 30 11 - 51 U/L    Comment: Performed at [facility], [place] [address] [place], [place] [address]  Phosphorus     Status: None   Collection Time: 10/23/20  4:41 AM  Result Value Ref Range   Phosphorus 2.8 2.5 - 4.6 mg/dL    Comment: Performed at [facility], [place] [address], [place], [place] [address]  Magnesium     Status: None   Collection Time: 10/23/20  4:41 AM  Result Value Ref Range   Magnesium 2.0 1.7 - 2.4 mg/dL    Comment: Performed at [facility], [place] [address] [place], [place] [address]  Heparin level (unfractionated)     Status: Abnormal   Collection Time: 10/23/20  7:59 AM  Result Value Ref Range   Heparin Unfractionated 1.06 (H) 0.30 - 0.70 IU/mL    Comment: (NOTE) The clinical reportable range upper limit is being lowered to >1.10 to align with the FDA approved guidance for the current laboratory assay.  If heparin results are below expected values, and patient dosage has  been confirmed, suggest follow up testing of antithrombin III levels. Performed at [facility], [place] [address] [place], [place] [address]   APTT     Status: Abnormal   Collection Time: 10/23/20  7:59 AM  Result Value Ref Range   aPTT 95 (H) 24 - 36 seconds    Comment:        IF BASELINE aPTT IS ELEVATED, SUGGEST PATIENT RISK ASSESSMENT BE USED TO DETERMINE APPROPRIATE ANTICOAGULANT THERAPY. Performed at [facility], [place] [address], [place], [place] [address]    No results found.  Anti-infectives (From admission, onward)    Start     Dose/Rate Route Frequency Ordered Stop   10/21/20 0130  meropenem (MERREM) 1 g in sodium chloride 0.9 % 100 mL IVPB         1 g 200 mL/hr over 30 Minutes Intravenous Every 12 hours 10/21/20 0048          Assessment/Plan Choledocholithiasis  - This is a 87 year old male with a hx of A. Fib on Eliquis, CHF, HTN who was admitted for Choledocholithiasis. Patient recommended for ERCP and was planned for yesterday morning but declined. He is now wishing to pursue ERCP. We were asked to see to consider Laparoscopic  Cholecystectomy after ERCP. In reviewing patients images, he has a 62m CBD stone on MRCP but no gallstones in his gallbladder on RUQ [place] MRCP. No cholecystitis on imaging. I discussed with patient and his family risks vs benefits of Laparoscopic Cholecystectomy. Patient and family would like to hold off on Laparoscopic Cholecystectomy after ERCP which I think is reasonable given there is no evidence of Cholecystitis, he has no gallstones remaining in gallbladder on imaging and patients increased risks w/ general anesthesia 2/2 age and MMP. Patient and family understand there is a risk of recurrence in the future if there are stones in the gallbladder that were missed on imaging. Discussed with attending who agrees with plan. We will be available as needed moving forward. Abx per TRH and GI.   MJillyn Ledger PGalion Community HospitalSurgery 10/23/2020, 11:23 AM Please see Amion for pager number during day hours 7:00am-4:30pm

## 2020-10-23 NOTE — Care Management Important Message (Signed)
Important Message  Patient Details IM Letter placed in Patient's room. Name: Cory Hansen MRN: [id number] Date of Birth: 01-13-1921   Medicare Important Message Given:  Yes     Caren Macadam 10/23/2020, 10:37 AM

## 2020-10-23 NOTE — Progress Notes (Signed)
PROGRESS NOTE  Cory WYMORE NWG:[id number] DOB: 10-May-1920   PCP: Hoyt Koch, MD  Patient is from: Home  DOA: 10/20/2020 LOS: 3  Chief complaints:  Chief Complaint  Patient presents with   Chest Pain     Brief Narrative / Interim history: 87 year old M with history of dementia, A. Fib on Eliquis, diastolic CHF, nephrolithiasis, HTN and osteoarthritis presenting with chest discomfort, shortness of breath and abdominal pain, and admitted for possible cholangitis in the setting of acute choledocholithiasis measuring about 13 mm with CBD to 9 mm on MRCP.  He was a started on IV meropenem.  GI consulted.  Initially, patient and family initially refused ERCP or other surgical intervention but they changed their mind.  Now ERCP planned for 7/20.  Per surgery, no plan for lap chole without cholecystitis.   Subjective: Seen and examined earlier this morning.  No major events overnight of this morning.  No complaints.  Denies pain, nausea, vomiting or shortness of breath  Objective: Vitals:   10/23/20 0508 10/23/20 0954 10/23/20 1246 10/23/20 1417  BP: 121/73 104/66 110/61   Pulse: 68 77 76   Resp: 18  16   Temp: 97.9 F (36.6 C)  97.9 F (36.6 C)   TempSrc: Oral  Oral   SpO2: 96%  97%   Weight:      Height:    '5\' 11"'  (1.803 m)    Intake/Output Summary (Last 24 hours) at 10/23/2020 1537 Last data filed at 10/23/2020 1327 Gross per 24 hour  Intake 2118.6 ml  Output 500 ml  Net 1618.6 ml   Filed Weights   10/22/20 1350  Weight: 68.7 kg    Examination:  GENERAL: No apparent distress.  Nontoxic. HEENT: MMM.  Vision and hearing grossly intact.  NECK: Supple.  No apparent JVD.  RESP: On RA.  No IWOB.  Fair aeration bilaterally. CVS:  RRR. Heart sounds normal.  ABD/GI/GU: BS+. Abd soft, NTND.  MSK/EXT:  Moves extremities. No apparent deformity. No edema.  SKIN: no apparent skin lesion or wound NEURO: Awake and alert. Oriented appropriately.  No apparent focal  neuro deficit. PSYCH: Calm. Normal affect.   Procedures:  None  Microbiology summarized: [id number] and influenza PCR nonreactive.  Assessment & Plan: Severe sepsis due to acute cholangitis in the setting of choledocholithiasis-POA.  Patient met criteria on presentation with leukocytosis, tachycardia, tachypnea and hypotension.  Sepsis physiology resolved. -Now interested in ERCP-scheduled for 7/20 -IV meropenem>> IV Unasyn -Recheck LFT and lipase in the morning -Continue IVF at 50 cc an hour  Elevated LFTs/hyperbilirubinemia: Due to #1.  Improved. Recent Labs  Lab 10/20/20 1651 10/21/20 0404 10/22/20 0535 10/23/20 0441  AST 152* 137* 83* 84*  ALT 119* 121* 97* 91*  ALKPHOS 449* 335* 290* 298*  BILITOT 2.8* 4.0* 4.1* 2.3*  PROT 7.8 6.1* 5.9* 5.9*  ALBUMIN 4.1 3.1* 3.0* 2.9*  -Recheck in the morning   Paroxysmal A. fib: Rate controlled.  On metoprolol and Eliquis at home. -Resume home metoprolol.  Discontinue as needed labetalol. -Continue heparin gtt pending ERCP   Chronic diastolic CHF: Limited TTE in 06/2020 with LVEF of 55 to 60%, RVSP of 44.7.  Appears compensated.  On Lasix at home. -Continue holding Lasix -Monitor fluid and respiratory status while on IV fluid.  Dementia without behavioral disturbance: Seems to be fairly oriented -Reorientation and delirium precautions  History of bladder cancer/BPH-no LUTS. -Continue home Flomax  GERD:  -Continue Protonix  Body mass index is 21.12 kg/m. Nutrition Problem:  Inadequate oral intake Etiology: acute illness Signs/Symptoms: per patient/family report     DVT prophylaxis:  SCDs Start: 10/21/20 0134  Code Status: DNR/DNI Family Communication: Patient and/or RN.  Updated patient's daughter at bedside Level of care: Telemetry Status is: Inpatient  Remains inpatient appropriate because:IV treatments appropriate due to intensity of illness or inability to take PO and Inpatient level of care appropriate due to  severity of illness  Dispo: The patient is from: Home              Anticipated d/c is to: Home              Patient currently is not medically stable to d/c.   Difficult to place patient No       Consultants:  Gastroenterology General surgery   Sch Meds:  Scheduled Meds:  feeding supplement  237 mL Oral TID BM   metoprolol tartrate  12.5 mg Oral BID   [START ON 10/24/2020] multivitamin with minerals  1 tablet Oral Daily   pantoprazole (PROTONIX) IV  40 mg Intravenous Q24H   Continuous Infusions:  sodium chloride 50 mL/hr at 10/23/20 1214   ampicillin-sulbactam (UNASYN) IV 3 g (10/23/20 1222)   heparin 1,050 Units/hr (10/23/20 1421)   PRN Meds:.ondansetron **OR** ondansetron (ZOFRAN) IV, polyvinyl alcohol  Antimicrobials: Anti-infectives (From admission, onward)    Start     Dose/Rate Route Frequency Ordered Stop   10/23/20 1245  Ampicillin-Sulbactam (UNASYN) 3 g in sodium chloride 0.9 % 100 mL IVPB        3 g 200 mL/hr over 30 Minutes Intravenous Every 6 hours 10/23/20 1150     10/21/20 0130  meropenem (MERREM) 1 g in sodium chloride 0.9 % 100 mL IVPB  Status:  Discontinued        1 g 200 mL/hr over 30 Minutes Intravenous Every 12 hours 10/21/20 0048 10/23/20 1139        I have personally reviewed the following labs and images: CBC: Recent Labs  Lab 10/20/20 1651 10/21/20 0404 10/22/20 0535 10/23/20 0441  WBC 11.4* 16.0* 6.3 5.4  NEUTROABS 9.8*  --  3.9  --   HGB 12.1* 10.4* 9.8* 10.5*  HCT 39.5 33.9* 31.7* 32.8*  MCV 98.5 99.7 97.2 94.8  PLT 271 196 203 151   BMP &GFR Recent Labs  Lab 10/20/20 1651 10/21/20 0404 10/22/20 0535 10/23/20 0441  NA 139 137 138 139  K 4.3 5.1 4.4 4.3  CL 108 108 109 109  CO2 21* 20* 24 20*  GLUCOSE 106* 206* 92 95  BUN '18 17 16 12  ' CREATININE 1.22 0.89 0.92 0.88  CALCIUM 9.4 8.5* 8.7* 9.0  MG  --   --  2.2 2.0  PHOS  --   --  3.5 2.8   Estimated Creatinine Clearance: 46.6 mL/min (by C-G formula based on SCr of  0.88 mg/dL). Liver & Pancreas: Recent Labs  Lab 10/20/20 1651 10/21/20 0404 10/22/20 0535 10/23/20 0441  AST 152* 137* 83* 84*  ALT 119* 121* 97* 91*  ALKPHOS 449* 335* 290* 298*  BILITOT 2.8* 4.0* 4.1* 2.3*  PROT 7.8 6.1* 5.9* 5.9*  ALBUMIN 4.1 3.1* 3.0* 2.9*   Recent Labs  Lab 10/20/20 1651 10/23/20 0441  LIPASE 47 30   No results for input(s): AMMONIA in the last 168 hours. Diabetic: No results for input(s): HGBA1C in the last 72 hours. No results for input(s): GLUCAP in the last 168 hours. Cardiac Enzymes: No results for input(s): CKTOTAL, CKMB, CKMBINDEX,  TROPONINI in the last 168 hours. No results for input(s): PROBNP in the last 8760 hours. Coagulation Profile: No results for input(s): INR, PROTIME in the last 168 hours. Thyroid Function Tests: No results for input(s): TSH, T4TOTAL, FREET4, T3FREE, THYROIDAB in the last 72 hours. Lipid Profile: No results for input(s): CHOL, HDL, LDLCALC, TRIG, CHOLHDL, LDLDIRECT in the last 72 hours. Anemia Panel: No results for input(s): VITAMINB12, FOLATE, FERRITIN, TIBC, IRON, RETICCTPCT in the last 72 hours. Urine analysis:    Component Value Date/Time   COLORURINE YELLOW 06/30/2020 0305   APPEARANCEUR CLEAR 06/30/2020 0305   LABSPEC 1.019 06/30/2020 0305   PHURINE 6.0 06/30/2020 0305   GLUCOSEU NEGATIVE 06/30/2020 0305   GLUCOSEU NEGATIVE 10/31/2017 1407   HGBUR NEGATIVE 06/30/2020 0305   BILIRUBINUR NEGATIVE 06/30/2020 0305   KETONESUR NEGATIVE 06/30/2020 0305   PROTEINUR NEGATIVE 06/30/2020 0305   UROBILINOGEN 0.2 10/31/2017 1407   NITRITE NEGATIVE 06/30/2020 0305   LEUKOCYTESUR NEGATIVE 06/30/2020 0305   Sepsis Labs: Invalid input(s): PROCALCITONIN, [place]  Microbiology: Recent Results (from the past 240 hour(s))  Resp Panel by RT-PCR (Flu A&B, Covid) Nasopharyngeal Swab     Status: None   Collection Time: 10/20/20  4:49 PM   Specimen: Nasopharyngeal Swab; Nasopharyngeal(NP) swabs in vial transport  medium  Result Value Ref Range Status   SARS Coronavirus 2 by RT PCR NEGATIVE NEGATIVE Final    Comment: (NOTE) SARS-CoV-2 target nucleic acids are NOT DETECTED.  The SARS-CoV-2 RNA is generally detectable in upper respiratory specimens during the acute phase of infection. The lowest concentration of SARS-CoV-2 viral copies this assay can detect is 138 copies/mL. A negative result does not preclude SARS-Cov-2 infection and should not be used as the sole basis for treatment or other patient management decisions. A negative result may occur with  improper specimen collection/handling, submission of specimen other than nasopharyngeal swab, presence of viral mutation(s) within the areas targeted by this assay, and inadequate number of viral copies(<138 copies/mL). A negative result must be combined with clinical observations, patient history, and epidemiological information. The expected result is Negative.  Fact Sheet for Patients:  EntrepreneurPulse.com.au  Fact Sheet for Healthcare Providers:  IncredibleEmployment.be  This test is no t yet approved or cleared by the [place] FDA and  has been authorized for detection and/or diagnosis of SARS-CoV-2 by FDA under an Emergency Use Authorization (EUA). This EUA will remain  in effect (meaning this test can be used) for the duration of the COVID-19 declaration under Section 564(b)(1) of the Act, 21 U.S.C.section 360bbb-3(b)(1), unless the authorization is terminated  or revoked sooner.       Influenza A by PCR NEGATIVE NEGATIVE Final   Influenza B by PCR NEGATIVE NEGATIVE Final    Comment: (NOTE) The Xpert Xpress SARS-CoV-2/FLU/RSV plus assay is intended as an aid in the diagnosis of influenza from Nasopharyngeal swab specimens and should not be used as a sole basis for treatment. Nasal washings and aspirates are unacceptable for Xpert Xpress SARS-CoV-2/FLU/RSV testing.  Fact Sheet for  Patients: EntrepreneurPulse.com.au  Fact Sheet for Healthcare Providers: IncredibleEmployment.be  This test is not yet approved or cleared by the [place] FDA and has been authorized for detection and/or diagnosis of SARS-CoV-2 by FDA under an Emergency Use Authorization (EUA). This EUA will remain in effect (meaning this test can be used) for the duration of the COVID-19 declaration under Section 564(b)(1) of the Act, 21 U.S.C. section 360bbb-3(b)(1), unless the authorization is terminated or revoked.  Performed at [facility], [place] [address] [place], [place] [address]     Radiology Studies: No results found.      Quintrell Baze T. Mascotte  If 7PM-7AM, please contact night-coverage [email or web address] 10/23/2020, 3:37 PM

## 2020-10-23 NOTE — Progress Notes (Signed)
[facility] Progress Note  Cory Hansen 87 y.o. 01/30/23  CC:  Choledocholithasis   Subjective: Patient denies abdominal pain, nausea, vomiting. He and daughter at bedside state they want to proceed with ERCP and lap chole now.  ROS : Review of Systems  Cardiovascular:  Negative for chest pain and palpitations.  Gastrointestinal:  Negative for abdominal pain, blood in stool, constipation, diarrhea, heartburn, melena, nausea and vomiting.   Objective: Vital signs in last 24 hours: Vitals:   10/23/20 0508 10/23/20 0954  BP: 121/73 104/66  Pulse: 68 77  Resp: 18   Temp: 97.9 F (36.6 C)   SpO2: 96%     Physical Exam:  General:  Alert, cooperative, no distress  Head:  Normocephalic, without obvious abnormality, atraumatic  Eyes:  Anicteric sclera, EOMs intact  Lungs:   Respirations unlabored  Abdomen:   Soft, non-tender, bowel sounds active all four quadrants  Extremities: Extremities normal, atraumatic, no  edema    Lab Results: Recent Labs    10/22/20 0535 10/23/20 0441  NA 138 139  K 4.4 4.3  CL 109 109  CO2 24 20*  GLUCOSE 92 95  BUN 16 12  CREATININE 0.92 0.88  CALCIUM 8.7* 9.0  MG 2.2 2.0  PHOS 3.5 2.8   Recent Labs    10/22/20 0535 10/23/20 0441  AST 83* 84*  ALT 97* 91*  ALKPHOS 290* 298*  BILITOT 4.1* 2.3*  PROT 5.9* 5.9*  ALBUMIN 3.0* 2.9*   Recent Labs    10/20/20 1651 10/21/20 0404 10/22/20 0535 10/23/20 0441  WBC 11.4*   < > 6.3 5.4  NEUTROABS 9.8*  --  3.9  --   HGB 12.1*   < > 9.8* 10.5*  HCT 39.5   < > 31.7* 32.8*  MCV 98.5   < > 97.2 94.8  PLT 271   < > 203 151   < > = values in this interval not displayed.   No results for input(s): LABPROT, INR in the last 72 hours.   Assessment: Choledocholithasis: MRI/MRCP on 7/15 revealed choledocholithiasis with a 13 mm distal CBD stone. Common duct measures 9 mm. No intrahepatic ductal dilatation. -T. Bili 2.3, improved from 4.1 yesterday -AST 84/ ALT 91/ ALP 298,  improving -Leukocytosis has resolved  Plan: Patient and patient's daughter state they wish to proceed with ERCP and lap chole.  CCS consulted for further evaluation.  Plan for ERCP on Wednesday at 1:30PM with Dr. Ewing Schlein.  Continue supportive care.  Continue to trend LFTs.  [facility] will follow.  Edrick Kins PA-C 10/23/2020, 11:32 AM  Contact #  [phone]

## 2020-10-23 NOTE — Progress Notes (Signed)
Initial Nutrition Assessment  DOCUMENTATION CODES:   Not applicable  INTERVENTION:   Ensure Enlive po TID, each supplement provides 350 kcal and 20 grams of protein   MVI po daily   Pt at high refeed risk; recommend monitor potassium, magnesium and phosphorus labs daily until stable  NUTRITION DIAGNOSIS:   Inadequate oral intake related to acute illness as evidenced by per patient/family report  GOAL:   Patient will meet greater than or equal to 90% of their needs  MONITOR:   PO intake, Supplement acceptance, Labs, Diet advancement, Weight trends, Skin, I & O's  REASON FOR ASSESSMENT:   Malnutrition Screening Tool    ASSESSMENT:   87 y.o. male with medical history significant of mild dementia, atrial fibrillation, CHF, recurrent kidney stones, essential hypertension, osteoarthritis, HLD, bladder cancer, inguinal hernia repair, GERD and HOH who is admitted with choledochliathiasis.  RD working remotely.  Unable to reach pt by phone; pt is noted to have some mild confusion/dementia and is HOH. Per chart review, family reports pt with decreased oral intake and wt loss pta. Per chart, pt appears to be down 25lbs(15%) over the past 4 months if his admit weight is correct; this would be considered significant weight loss. Pt initiated on a full liquid diet yesterday; pt currently eating 100% of meals and is drinking Boost Breeze. RD will change pt over to Ensure as this will provide more protein and calories. Pt is likely at refeed risk. Pt is at high risk for malnutrition. RD will attempt to obtain nutrition related history and exam at follow-up. Per chart, plan is for ERCP on 7/20.    Medications reviewed and include: protonix, NaCl '@50ml' /hr, unasyn, heparin   Labs reviewed: K 4.3 wnl, P 2.8 wnl, Mg 2.0 wnl, Alk Phos 298(H), AST 84(H), ALT 91(H), tbili 2.3(H) Hgb 10.5(L), Hct 32.8(L)  NUTRITION - FOCUSED PHYSICAL EXAM: Unable to perform at this time   Diet Order:   Diet  Order             Diet full liquid Room service appropriate? Yes; Fluid consistency: Thin  Diet effective now                  EDUCATION NEEDS:   No education needs have been identified at this time  Skin:  Skin Assessment: Reviewed RN Assessment  Last BM:  7/17- type 4  Height:   Ht Readings from Last 1 Encounters:  10/23/20 '5\' 11"'  (1.803 m)    Weight:   Wt Readings from Last 1 Encounters:  10/22/20 68.7 kg    Ideal Body Weight:  78.18 kg  BMI:  Body mass index is 21.12 kg/m.  Estimated Nutritional Needs:   Kcal:  1700-2000kcal/day  Protein:  85-100g/day  Fluid:  1.7-2.0L/day  Koleen Distance MS, RD, LDN Please refer to [facility] for RD and/or RD on-call/weekend/after hours pager

## 2020-10-23 NOTE — Evaluation (Signed)
Occupational Therapy Evaluation Patient Details Name: Cory Hansen MRN: [id number] DOB: 22-Dec-1920 Today's Date: 10/23/2020    History of Present Illness Patient  is a 87 year old male with history of dementia, Covid, Left foot 1st ray amputation, hearing loss, A. Fib on Eliquis, diastolic CHF, nephrolithiasis, HTN and osteoarthritis presenting with chest discomfort, shortness of breath and abdominal pain, and admitted for possible cholangitis in the setting of acute choledocholithiasis. patient is scheduled for ERCP on 7/20   Clinical Impression   Patient is a pleasant 87 year old male who was admitted for above. Patient evaluated by Occupational Therapy with no further acute OT needs identified. All education has been completed and the patient has no further questions. Patient is at baseline with 24/7 support from daughter at home. Patient was min guard for LB dressing tasks seated on edge of bed and to take few steps to head of bed with rolling walker. No knee buckling noted during this session. Patient needed increased safety cues to keep from pulling lines.  See below for any follow-up Occupational Therapy or equipment needs. OT is signing off.      Follow Up Recommendations  Supervision/Assistance - 24 hour    Equipment Recommendations       Recommendations for Other Services       Precautions / Restrictions Precautions Precautions: Fall Restrictions Weight Bearing Restrictions: No      Mobility Bed Mobility Overal bed mobility: Needs Assistance Bed Mobility: Supine to Sit     Supine to sit: Supervision;HOB elevated     General bed mobility comments: therapist provided a hand for pt to self assist    Transfers Overall transfer level: Needs assistance Equipment used: Rolling walker (2 wheeled) Transfers: Sit to/from Stand Sit to Stand: Min guard         General transfer comment: patient required cues for hand placement and safety.    Balance Overall  balance assessment: History of Falls;Needs assistance Sitting-balance support: Bilateral upper extremity supported Sitting balance-Leahy Scale: Fair     Standing balance support: Bilateral upper extremity supported Standing balance-Leahy Scale: Poor Standing balance comment: reliant on UE support                           ADL either performed or assessed with clinical judgement   ADL Overall ADL's : At baseline                                       General ADL Comments: patient is supervision from daughters for ADL tasks at home.     Vision Patient Visual Report: No change from baseline Additional Comments: patient reported having "bad" vision with ability to see signs on wall while seated in bed but unable to tell what they say. patient also reported that this is baseline with difficulty seeing screws to work in work shop.     Perception     Praxis      Pertinent Vitals/Pain Pain Assessment: No/denies pain     Hand Dominance Right   Extremity/Trunk Assessment Upper Extremity Assessment Upper Extremity Assessment: Overall WFL for tasks assessed   Lower Extremity Assessment Lower Extremity Assessment: Defer to PT evaluation RLE Deficits / Details: R knee weakness and buckles, pt has received injections in the past       Communication Communication Communication: HOH   Cognition Arousal/Alertness: Awake/alert Behavior During  Therapy: WFL for tasks assessed/performed Overall Cognitive Status: Within Functional Limits for tasks assessed                                 General Comments: HOH but appropriate   General Comments       Exercises     Shoulder Instructions      Home Living Family/patient expects to be discharged to:: Private residence Living Arrangements: Children Available Help at Discharge: Family;Available 24 hours/day Type of Home: House Home Access: Ramped entrance     Home Layout: One level      Bathroom Shower/Tub: Chief Strategy Officer: Handicapped height     Home Equipment: Hand held shower head;Shower seat;Grab bars - tub/shower;Walker - 2 wheels;Bedside commode;Wheelchair - manual;Hospital bed          Prior Functioning/Environment Level of Independence: Needs assistance  Gait / Transfers Assistance Needed: uses RW for ambulation, family uses follows with w/c for safety due to right knee "giving out" ADL's / Homemaking Assistance Needed: patient reports I with dressing, toileting, daughters help him wash his back, neck, bottom of his feet. Daughters do IADLs meal prep, cleaning, laundry, grocery shopping            OT Problem List:        OT Treatment/Interventions:      OT Goals(Current goals can be found in the care plan section) Acute Rehab OT Goals Patient Stated Goal: to get out of here soon OT Goal Formulation: With patient  OT Frequency:     Barriers to D/C:            Co-evaluation              AM-PAC OT "6 Clicks" Daily Activity     Outcome Measure Help from another person eating meals?: A Little Help from another person taking care of personal grooming?: A Little Help from another person toileting, which includes using toliet, bedpan, or urinal?: A Little Help from another person bathing (including washing, rinsing, drying)?: A Little Help from another person to put on and taking off regular upper body clothing?: A Little Help from another person to put on and taking off regular lower body clothing?: A Little 6 Click Score: 18   End of Session Equipment Utilized During Treatment: Rolling walker Nurse Communication: Other (comment) (nurse cleared patient for session.)  Activity Tolerance: Patient tolerated treatment well Patient left: in bed;with call bell/phone within reach;with bed alarm set                   Time: 1349-1407 OT Time Calculation (min): 18 min Charges:  OT General Charges $OT Visit: 1 Visit OT  Evaluation $OT Eval Low Complexity: 1 Low  Sharyn Blitz OTR/L, MS Acute Rehabilitation Department Office# [phone] Pager# [phone]   [name] 10/23/2020, 2:48 PM

## 2020-10-23 NOTE — Evaluation (Signed)
Physical Therapy Evaluation Patient Details Name: Cory Hansen MRN: [id number] DOB: 1920/10/19 Today's Date: 10/23/2020   History of Present Illness  Pt is a 87 year old male with history of dementia, Covid, Left foot 1st ray amputation, hearing loss, A. Fib on Eliquis, diastolic CHF, nephrolithiasis, HTN and osteoarthritis presenting with chest discomfort, shortness of breath and abdominal pain, and admitted for possible cholangitis in the setting of acute choledocholithiasis  Clinical Impression  Pt admitted with above diagnosis. Pt currently with functional limitations due to the deficits listed below (see PT Problem List). Pt will benefit from skilled PT to increase their independence and safety with mobility to allow discharge to the venue listed below.   Pt assisted with ambulating in hallway and pleased with his distance today.  Pt typically ambulates with a RW at home and has family member follow with a w/c due to right knee instability.  Pt has 5 daughters that rotate staying with patient so pt plans to d/c home.      Follow Up Recommendations Home health PT;Supervision for mobility/OOB    Equipment Recommendations  None recommended by PT    Recommendations for Other Services       Precautions / Restrictions Precautions Precautions: Fall      Mobility  Bed Mobility Overal bed mobility: Needs Assistance Bed Mobility: Supine to Sit     Supine to sit: Min assist;HOB elevated     General bed mobility comments: therapist provided a hand for pt to self assist    Transfers Overall transfer level: Needs assistance Equipment used: Rolling walker (2 wheeled) Transfers: Sit to/from Stand Sit to Stand: Min assist         General transfer comment: light assist to rise and steady, cues for hand placement  Ambulation/Gait Ambulation/Gait assistance: Min guard Gait Distance (Feet): 140 Feet (total) Assistive device: Rolling walker (2 wheeled) Gait Pattern/deviations:  Step-through pattern;Decreased stride length;Decreased stance time - right Gait velocity: decr   General Gait Details: pt required one seated rest break due to LE fatigue and weakness, family typically follows pt with a w/c at home for safety.  Stairs            Wheelchair Mobility    Modified Rankin (Stroke Patients Only)       Balance Overall balance assessment: History of Falls;Needs assistance         Standing balance support: Bilateral upper extremity supported Standing balance-Leahy Scale: Poor Standing balance comment: reliant on UE support                             Pertinent Vitals/Pain Pain Assessment: No/denies pain    Home Living Family/patient expects to be discharged to:: Private residence Living Arrangements: Children Available Help at Discharge: Family;Available 24 hours/day Type of Home: House Home Access: Ramped entrance     Home Layout: One level Home Equipment: Hand held shower head;Shower seat;Grab bars - tub/shower;Walker - 2 wheels;Bedside commode;Wheelchair - manual;Hospital bed      Prior Function Level of Independence: Needs assistance   Gait / Transfers Assistance Needed: uses RW for ambulation, family uses follows with w/c for safety due to right knee "giving out"           Hand Dominance        Extremity/Trunk Assessment        Lower Extremity Assessment Lower Extremity Assessment: RLE deficits/detail;Generalized weakness RLE Deficits / Details: R knee weakness and buckles, pt has received  injections in the past       Communication   Communication: HOH  Cognition Arousal/Alertness: Awake/alert Behavior During Therapy: WFL for tasks assessed/performed Overall Cognitive Status: Within Functional Limits for tasks assessed                                 General Comments: HOH but appropriate      General Comments      Exercises     Assessment/Plan    PT Assessment Patient needs  continued PT services  PT Problem List Decreased balance;Decreased strength;Decreased mobility;Decreased activity tolerance;Decreased knowledge of use of DME       PT Treatment Interventions Gait training;DME instruction;Therapeutic exercise;Balance training;Functional mobility training;Therapeutic activities;Patient/family education    PT Goals (Current goals can be found in the Care Plan section)  Acute Rehab PT Goals PT Goal Formulation: With patient Time For Goal Achievement: 11/06/20 Potential to Achieve Goals: Good    Frequency Min 3X/week   Barriers to discharge        Co-evaluation               AM-PAC PT "6 Clicks" Mobility  Outcome Measure Help needed turning from your back to your side while in a flat bed without using bedrails?: A Little Help needed moving from lying on your back to sitting on the side of a flat bed without using bedrails?: A Little Help needed moving to and from a bed to a chair (including a wheelchair)?: A Little Help needed standing up from a chair using your arms (e.g., wheelchair or bedside chair)?: A Little Help needed to walk in hospital room?: A Little Help needed climbing 3-5 steps with a railing? : A Lot 6 Click Score: 17    End of Session Equipment Utilized During Treatment: Gait belt Activity Tolerance: Patient tolerated treatment well Patient left: in chair;with call bell/phone within reach;with family/visitor present Nurse Communication: Mobility status PT Visit Diagnosis: Difficulty in walking, not elsewhere classified (R26.2)    Time: 7253-6644 PT Time Calculation (min) (ACUTE ONLY): 26 min   Charges:   PT Evaluation $PT Eval Low Complexity: 1 Low PT Treatments $Gait Training: 8-22 mins      Thomasene Mohair PT, DPT Acute Rehabilitation Services Pager: [phone] Office: [phone]   Maida Sale E 10/23/2020, 12:34 PM

## 2020-10-23 NOTE — Progress Notes (Signed)
Pharmacy Antibiotic Note  Cory Hansen is a 87 y.o. male admitted on 10/20/2020 with shortness of breath and abdominal pain which has resolved.  An ultrasound and MRCP was obtained which shows CBD stone- ERCP scheduled for 10/25/20.  Discussed with Dr. Alanda Slim, ok to switch meropenem to Unasyn for intra-abdominal infection.    WBC 5.4, afebrile  Plan: Discontinue meropenem Start Unasyn 3g IV q6 hours Follow renal function and clinical course F/u ability to switch to Augmentin   Weight: 68.7 kg (151 lb 7.3 oz)  Temp (24hrs), Avg:97.8 F (36.6 C), Min:97.4 F (36.3 C), Max:98.1 F (36.7 C)  Recent Labs  Lab 10/20/20 1651 10/21/20 0404 10/22/20 0535 10/23/20 0441  WBC 11.4* 16.0* 6.3 5.4  CREATININE 1.22 0.89 0.92 0.88     Estimated Creatinine Clearance: 46.6 mL/min (by C-G formula based on SCr of 0.88 mg/dL).    No Known Allergies  Thank you for allowing pharmacy to be a part of this patient's care.  Rexford Maus, PharmD 10/23/2020 11:39 AM

## 2020-10-23 NOTE — Progress Notes (Addendum)
ANTICOAGULATION CONSULT NOTE  Pharmacy Consult for heparin Indication: hx atrial fibrillation (home Eliquis on hold)  No Known Allergies  Patient Measurements: height 71 inches, weight 69 kg  Weight: 68.7 kg (151 lb 7.3 oz) Heparin Dosing Weight: 69 kg  Vital Signs: Temp: 97.9 F (36.6 C) (07/18 0508) Temp Source: Oral (07/18 0508) BP: 121/73 (07/18 0508) Pulse Rate: 68 (07/18 0508)  Labs: Recent Labs    10/20/20 1651 10/20/20 1848 10/21/20 0404 10/22/20 0535 10/22/20 1441 10/22/20 2309 10/23/20 0441  HGB 12.1*  --  10.4* 9.8*  --   --  10.5*  HCT 39.5  --  33.9* 31.7*  --   --  32.8*  PLT 271  --  196 203  --   --  151  APTT  --   --   --   --  30 62*  --   HEPARINUNFRC  --   --   --   --  0.97* 0.82*  --   CREATININE 1.22  --  0.89 0.92  --   --  0.88  TROPONINIHS 7 7  --   --   --   --   --      Estimated Creatinine Clearance: 46.6 mL/min (by C-G formula based on SCr of 0.88 mg/dL).   Medications:  - on Eliquis 5 mg bid PTA (last dose taken on 10/20/20 at 8a)  Assessment: Patient is a 87 y.o M with hx afib on Eliquis PTA, presented to the ED on 10/20/20 with c/o CP, SOB and abdominal pain.  Abdominal MRI on 7/15 showed choledocholithiasis with a distal CBD stone.  Patient and family currently do not want to pursue ERCP or cholecystectomy.  Pharmacy has been consulted to dose heparin drip, while Eliquis is on hold, in case invasive intervention is needed.  aPTT therapeutic at 95 after rate increase to 1150/hr, however this is a large increase from  yesterday (aPTT was 62 seconds) Heparin level supratherapeutic at 1.06 (likely due to residual effects of Eliquis, however HL is increased from yesterday) -Discussed with RN, heparin drip is running in right AC, unsure of where HL was drawn.  No bleeding seen.    Goal of Therapy:  Heparin level 0.3-0.7 units/ml aPTT 66-102 seconds Monitor platelets by anticoagulation protocol: Yes   Plan:  - Reduce heparin rate to  1050 units/hr - Check 8 hr heparin level and aPTT - Continue to trend both aPTT and HL until correlating - Monitor for s/sx bleeding - F/u surgery plans and ability to resume Eliquis  Rexford Maus, PharmD 10/23/2020 8:30 AM

## 2020-10-23 NOTE — Progress Notes (Signed)
Brief Pharmacy Note re: IV heparin  Patient is a 87 y.o M with hx afib on Eliquis PTA.  Pharmacy has been consulted to dose heparin drip, while Eliquis is on hold, in case invasive intervention is needed.  O: aPTT therapeutic at 99 sec after rate decrease to 1050 units/hr - No bleeding or infusion related concerns per RN  *aPTT was recollected b/c sample obtained at 1840 was reported by lab as hemolyzed.   Goal of Therapy:  Heparin level 0.3-0.7 units/ml aPTT 66-102 seconds Monitor platelets by anticoagulation protocol: Yes   Plan:  - Continue heparin rate at 1050 units/hr - Continue to trend both aPTT and HL daily until correlating - Monitor for s/sx bleeding - F/u surgery plans and ability to resume Eliquis  Junita Push,  PharmD 10/23/2020 9:22 PM

## 2020-10-23 NOTE — TOC Initial Note (Signed)
Transition of Care [facility]) - Initial/Assessment Note    Patient Details  Name: Cory Hansen MRN: [id number] Date of Birth: 09/29/1920  [facility] CM/SW Contact:    Ida Rogue, LCSW Phone Number: 10/23/2020, 4:14 PM  Clinical Narrative:   Patient seen in response to PT recommendation of HH PT.  Daughter was at bedside.  They informed me that his 5 daughters are on a rotating schedule with someone in the home with Cory Hansen at all times. He has all need DME, including RW, wheelchair, 3 in 1. They are open to [facility] services, no agency preference. TOC will continue to follow during the course of hospitalization.                 Expected Discharge Plan: Home w Home Health Services Barriers to Discharge: No Barriers Identified   Patient Goals and CMS Choice     Choice offered to / list presented to : Patient, Adult Children  Expected Discharge Plan and Services Expected Discharge Plan: Home w Home Health Services   Discharge Planning Services: CM Consult Post Acute Care Choice: Home Health Living arrangements for the past 2 months: Single Family Home                                      Prior Living Arrangements/Services Living arrangements for the past 2 months: Single Family Home Lives with:: Adult Children Patient language and need for interpreter reviewed:: Yes        Need for Family Participation in Patient Care: Yes (Comment) Care giver support system in place?: Yes (comment) Current home services: DME Criminal Activity/Legal Involvement Pertinent to Current Situation/Hospitalization: No - Comment as needed  Activities of Daily Living Home Assistive Devices/Equipment: Environmental consultant (specify type), Wheelchair ADL Screening (condition at time of admission) Patient's cognitive ability adequate to safely complete daily activities?: No Is the patient deaf or have difficulty hearing?: No Does the patient have difficulty seeing, even when wearing  glasses/contacts?: Yes Does the patient have difficulty concentrating, remembering, or making decisions?: Yes Patient able to express need for assistance with ADLs?: Yes Does the patient have difficulty dressing or bathing?: No Independently performs ADLs?: No Grooming: Independent Does the patient have difficulty walking or climbing stairs?: Yes Weakness of Legs: Both Weakness of Arms/Hands: None  Permission Sought/Granted Permission sought to share information with : Family Supports Permission granted to share information with : Yes, Verbal Permission Granted  Share Information with NAME: Cory Hansen Daughter     [phone]           Emotional Assessment Appearance:: Appears stated age Attitude/Demeanor/Rapport: Engaged Affect (typically observed): Appropriate Orientation: : Oriented to Self, Oriented to Place, Oriented to Situation Alcohol / Substance Use: Not Applicable Psych Involvement: No (comment)  Admission diagnosis:  Choledocholithiasis [K80.50] Upper abdominal pain [R10.10] Patient Active Problem List   Diagnosis Date Noted   Dementia (HCC) 10/21/2020   Choledocholithiasis 10/20/2020   CHF (congestive heart failure) (HCC) 06/20/2020   New onset atrial fibrillation (HCC) 06/19/2020   Acute CHF (HCC) 06/19/2020   Hypokalemia 03/14/2020   Acute right ankle pain 05/07/2019   Diarrhea 10/27/2015   Urinary dribbling 07/26/2015   Routine general medical examination at a health care facility 10/13/2014   CT, CHEST, ABNORMAL 01/23/2009   Chronic bilateral low back pain with right-sided sciatica 01/17/2009   NEOPLASM, MALIGNANT, BLADDER, HX OF 01/17/2009   Hx  of benign neoplasm of prostate 01/17/2009   Hyperlipidemia 01/16/2009   ARTHRITIS 01/16/2009   Weakness 01/16/2009   PCP:  [name], MD Pharmacy:   [facility] [address] [place]), [place] - [address] [place] ([place]) [place] [address] Phone: [phone] Fax:  [phone]     Social Determinants of Health (SDOH) Interventions    Readmission Risk Interventions No flowsheet data found.

## 2020-10-24 DIAGNOSIS — K8050 Calculus of bile duct without cholangitis or cholecystitis without obstruction: Secondary | ICD-10-CM | POA: Diagnosis not present

## 2020-10-24 DIAGNOSIS — Z8551 Personal history of malignant neoplasm of bladder: Secondary | ICD-10-CM | POA: Diagnosis not present

## 2020-10-24 DIAGNOSIS — A419 Sepsis, unspecified organism: Secondary | ICD-10-CM | POA: Diagnosis not present

## 2020-10-24 DIAGNOSIS — K8309 Other cholangitis: Secondary | ICD-10-CM | POA: Diagnosis not present

## 2020-10-24 LAB — CBC
HCT: 34.3 % — ABNORMAL LOW (ref 39.0–52.0)
Hemoglobin: 10.2 g/dL — ABNORMAL LOW (ref 13.0–17.0)
MCH: 30.4 pg (ref 26.0–34.0)
MCHC: 29.7 g/dL — ABNORMAL LOW (ref 30.0–36.0)
MCV: 102.1 fL — ABNORMAL HIGH (ref 80.0–100.0)
Platelets: 190 10*3/uL (ref 150–400)
RBC: 3.36 MIL/uL — ABNORMAL LOW (ref 4.22–5.81)
RDW: 15.2 % (ref 11.5–15.5)
WBC: 4.4 10*3/uL (ref 4.0–10.5)
nRBC: 0 % (ref 0.0–0.2)

## 2020-10-24 LAB — COMPREHENSIVE METABOLIC PANEL
ALT: 68 U/L — ABNORMAL HIGH (ref 0–44)
AST: 50 U/L — ABNORMAL HIGH (ref 15–41)
Albumin: 2.9 g/dL — ABNORMAL LOW (ref 3.5–5.0)
Alkaline Phosphatase: 272 U/L — ABNORMAL HIGH (ref 38–126)
Anion gap: 10 (ref 5–15)
BUN: 12 mg/dL (ref 8–23)
CO2: 22 mmol/L (ref 22–32)
Calcium: 8.9 mg/dL (ref 8.9–10.3)
Chloride: 110 mmol/L (ref 98–111)
Creatinine, Ser: 0.82 mg/dL (ref 0.61–1.24)
GFR, Estimated: >60 mL/min (ref >60)
Glucose, Bld: 98 mg/dL (ref 70–99)
Potassium: 4.5 mmol/L (ref 3.5–5.1)
Sodium: 142 mmol/L (ref 135–145)
Total Bilirubin: 1.6 mg/dL — ABNORMAL HIGH (ref 0.3–1.2)
Total Protein: 5.6 g/dL — ABNORMAL LOW (ref 6.5–8.1)

## 2020-10-24 LAB — APTT: aPTT: 74 seconds — ABNORMAL HIGH (ref 24–36)

## 2020-10-24 LAB — HEPARIN LEVEL (UNFRACTIONATED): Heparin Unfractionated: 0.65 IU/mL (ref 0.30–0.70)

## 2020-10-24 MED ORDER — PANTOPRAZOLE SODIUM 40 MG PO TBEC
40.0000 mg | DELAYED_RELEASE_TABLET | Freq: Every day | ORAL | Status: DC
  Administered 2020-10-26: 40 mg via ORAL
  Filled 2020-10-24: qty 1

## 2020-10-24 NOTE — Progress Notes (Signed)
PROGRESS NOTE  Cory Hansen EBR:[id number] DOB: 1921/01/30   PCP: Hoyt Koch, MD  Patient is from: Home  DOA: 10/20/2020 LOS: 4  Chief complaints:  Chief Complaint  Patient presents with   Chest Pain     Brief Narrative / Interim history: 87 year old M with history of dementia, A. Fib on Eliquis, diastolic CHF, nephrolithiasis, HTN and osteoarthritis presenting with chest discomfort, shortness of breath and abdominal pain, and admitted for possible cholangitis in the setting of acute choledocholithiasis measuring about 13 mm with CBD to 9 mm on MRCP.  He was a started on IV meropenem.  GI consulted.  Initially, patient and family initially refused ERCP or other surgical intervention but they changed their mind.  Now ERCP planned for 7/20.  Per surgery, no plan for lap chole without cholecystitis.   Subjective: Seen and examined earlier this morning.  No major events overnight of this morning.  No complaints.  He denies chest pain, dyspnea, GI or UTI symptoms.  Patient's daughter at bedside.  Objective: Vitals:   10/23/20 1417 10/23/20 2008 10/24/20 0511 10/24/20 1424  BP:  129/63 113/61 117/78  Pulse:  62 67 74  Resp:  _0 Temp:  98.2 F (36.8 C) 98.2 F (36.8 C) 98.3 F (36.8 C)  TempSrc:  Axillary Axillary Oral  SpO2:  97% 97% 98%  Weight:      Height: _1  (1.803 m)       Intake/Output Summary (Last 24 hours) at 10/24/2020 1544 Last data filed at 10/24/2020 0930 Gross per 24 hour  Intake 1543.08 ml  Output 200 ml  Net 1343.08 ml   Filed Weights   10/22/20 1350  Weight: 68.7 kg    Examination:  GENERAL: No apparent distress.  Nontoxic. HEENT: MMM.  Vision and hearing grossly intact.  NECK: Supple.  No apparent JVD.  RESP: On RA.  No IWOB.  Fair aeration bilaterally. CVS:  RRR. Heart sounds normal.  ABD/GI/GU: BS+. Abd soft, NTND.  MSK/EXT:  Moves extremities. No apparent deformity. No edema.  SKIN: no apparent skin lesion or  wound NEURO: Awake and alert. Oriented appropriately.  No apparent focal neuro deficit. PSYCH: Calm. Normal affect.   Procedures:  None  Microbiology summarized: [id number] and influenza PCR nonreactive.  Assessment & Plan: Severe sepsis due to acute cholangitis in the setting of choledocholithiasis-POA.  Patient met criteria on presentation with leukocytosis, tachycardia, tachypnea and hypotension.  Sepsis physiology resolved.  LFT improving. -Now interested in ERCP-scheduled for 7/20 -IV meropenem 7/16-7/18>> IV Unasyn 7/18>> -Continue IVF at 50 cc an hour  Elevated LFTs/hyperbilirubinemia: Due to #1.  Improved. Recent Labs  Lab 10/20/20 1651 10/21/20 0404 10/22/20 0535 10/23/20 0441 10/24/20 0435  AST 152* 137* 83* 84* 50*  ALT 119* 121* 97* 91* 68*  ALKPHOS 449* 335* 290* 298* 272*  BILITOT 2.8* 4.0* 4.1* 2.3* 1.6*  PROT 7.8 6.1* 5.9* 5.9* 5.6*  ALBUMIN 4.1 3.1* 3.0* 2.9* 2.9*  -Recheck in the morning   Paroxysmal A. fib: Rate controlled.  On metoprolol and Eliquis at home. -Continue home metoprolol. -Continue heparin gtt pending ERCP   Chronic diastolic CHF: Limited TTE in 06/2020 with LVEF of 55 to 60%, RVSP of 44.7.  Appears compensated.  On Lasix at home. -Continue holding Lasix -Monitor fluid and respiratory status while on IV fluid.  Macrocytic anemia: H&H stable after initial drop likely from IV fluid. Recent Labs    04/18/20 1351 06/19/20 1715 06/20/20 0803 06/28/20 1594 06/30/20 2106  10/20/20 1651 10/21/20 0404 10/22/20 0535 10/23/20 0441 10/24/20 0435  HGB 13.8 12.0* 12.3* 14.7 14.3 12.1* 10.4* 9.8* 10.5* 10.2*  -Check anemia panel in the morning -Monitor.  Dementia without behavioral disturbance: Seems to be fairly oriented -Reorientation and delirium precautions  History of bladder cancer/BPH-no LUTS. -Continue home Flomax  GERD:  -Continue Protonix  Body mass index is 21.12 kg/m. Nutrition Problem: Inadequate oral intake Etiology:  acute illness Signs/Symptoms: per patient/family report     DVT prophylaxis:  SCDs Start: 10/21/20 0134  Code Status: DNR/DNI Family Communication: Patient and/or RN.  Updated patient's daughter at bedside Level of care: Telemetry Status is: Inpatient  Remains inpatient appropriate because:IV treatments appropriate due to intensity of illness or inability to take PO and Inpatient level of care appropriate due to severity of illness  Dispo: The patient is from: Home              Anticipated d/c is to: Home              Patient currently is not medically stable to d/c.   Difficult to place patient No       Consultants:  Gastroenterology General surgery   Sch Meds:  Scheduled Meds:  feeding supplement  237 mL Oral TID BM   metoprolol tartrate  12.5 mg Oral BID   multivitamin with minerals  1 tablet Oral Daily   [START ON 10/25/2020] pantoprazole  40 mg Oral Daily   Continuous Infusions:  sodium chloride 50 mL/hr at 10/23/20 1214   ampicillin-sulbactam (UNASYN) IV 3 g (10/24/20 1227)   heparin 1,050 Units/hr (10/23/20 1421)   PRN Meds:.ondansetron **OR** ondansetron (ZOFRAN) IV, polyvinyl alcohol  Antimicrobials: Anti-infectives (From admission, onward)    Start     Dose/Rate Route Frequency Ordered Stop   10/23/20 1245  Ampicillin-Sulbactam (UNASYN) 3 g in sodium chloride 0.9 % 100 mL IVPB        3 g 200 mL/hr over 30 Minutes Intravenous Every 6 hours 10/23/20 1150     10/21/20 0130  meropenem (MERREM) 1 g in sodium chloride 0.9 % 100 mL IVPB  Status:  Discontinued        1 g 200 mL/hr over 30 Minutes Intravenous Every 12 hours 10/21/20 0048 10/23/20 1139        I have personally reviewed the following labs and images: CBC: Recent Labs  Lab 10/20/20 1651 10/21/20 0404 10/22/20 0535 10/23/20 0441 10/24/20 0435  WBC 11.4* 16.0* 6.3 5.4 4.4  NEUTROABS 9.8*  --  3.9  --   --   HGB 12.1* 10.4* 9.8* 10.5* 10.2*  HCT 39.5 33.9* 31.7* 32.8* 34.3*  MCV 98.5  99.7 97.2 94.8 102.1*  PLT 271 196 203 151 190   BMP &GFR Recent Labs  Lab 10/20/20 1651 10/21/20 0404 10/22/20 0535 10/23/20 0441 10/23/20 1840 10/24/20 0435  NA 139 137 138 139  --  142  K 4.3 5.1 4.4 4.3  --  4.5  CL 108 108 109 109  --  110  CO2 21* 20* 24 20*  --  22  GLUCOSE 106* 206* 92 95  --  98  BUN _0 --  12  CREATININE 1.22 0.89 0.92 0.88  --  0.82  CALCIUM 9.4 8.5* 8.7* 9.0  --  8.9  MG  --   --  2.2 2.0 2.0  --   PHOS  --   --  3.5 2.8 2.9  --    Estimated Creatinine  Clearance: 50 mL/min (by C-G formula based on SCr of 0.82 mg/dL). Liver & Pancreas: Recent Labs  Lab 10/20/20 1651 10/21/20 0404 10/22/20 0535 10/23/20 0441 10/24/20 0435  AST 152* 137* 83* 84* 50*  ALT 119* 121* 97* 91* 68*  ALKPHOS 449* 335* 290* 298* 272*  BILITOT 2.8* 4.0* 4.1* 2.3* 1.6*  PROT 7.8 6.1* 5.9* 5.9* 5.6*  ALBUMIN 4.1 3.1* 3.0* 2.9* 2.9*   Recent Labs  Lab 10/20/20 1651 10/23/20 0441  LIPASE 47 30   No results for input(s): AMMONIA in the last 168 hours. Diabetic: No results for input(s): HGBA1C in the last 72 hours. No results for input(s): GLUCAP in the last 168 hours. Cardiac Enzymes: No results for input(s): CKTOTAL, CKMB, CKMBINDEX, TROPONINI in the last 168 hours. No results for input(s): PROBNP in the last 8760 hours. Coagulation Profile: No results for input(s): INR, PROTIME in the last 168 hours. Thyroid Function Tests: No results for input(s): TSH, T4TOTAL, FREET4, T3FREE, THYROIDAB in the last 72 hours. Lipid Profile: No results for input(s): CHOL, HDL, LDLCALC, TRIG, CHOLHDL, LDLDIRECT in the last 72 hours. Anemia Panel: No results for input(s): VITAMINB12, FOLATE, FERRITIN, TIBC, IRON, RETICCTPCT in the last 72 hours. Urine analysis:    Component Value Date/Time   COLORURINE YELLOW 06/30/2020 0305   APPEARANCEUR CLEAR 06/30/2020 0305   LABSPEC 1.019 06/30/2020 0305   PHURINE 6.0 06/30/2020 0305   GLUCOSEU NEGATIVE 06/30/2020 0305    GLUCOSEU NEGATIVE 10/31/2017 1407   HGBUR NEGATIVE 06/30/2020 0305   BILIRUBINUR NEGATIVE 06/30/2020 0305   KETONESUR NEGATIVE 06/30/2020 0305   PROTEINUR NEGATIVE 06/30/2020 0305   UROBILINOGEN 0.2 10/31/2017 1407   NITRITE NEGATIVE 06/30/2020 0305   LEUKOCYTESUR NEGATIVE 06/30/2020 0305   Sepsis Labs: Invalid input(s): PROCALCITONIN, [place]  Microbiology: Recent Results (from the past 240 hour(s))  Resp Panel by RT-PCR (Flu A&B, Covid) Nasopharyngeal Swab     Status: None   Collection Time: 10/20/20  4:49 PM   Specimen: Nasopharyngeal Swab; Nasopharyngeal(NP) swabs in vial transport medium  Result Value Ref Range Status   SARS Coronavirus 2 by RT PCR NEGATIVE NEGATIVE Final    Comment: (NOTE) SARS-CoV-2 target nucleic acids are NOT DETECTED.  The SARS-CoV-2 RNA is generally detectable in upper respiratory specimens during the acute phase of infection. The lowest concentration of SARS-CoV-2 viral copies this assay can detect is 138 copies/mL. A negative result does not preclude SARS-Cov-2 infection and should not be used as the sole basis for treatment or other patient management decisions. A negative result may occur with  improper specimen collection/handling, submission of specimen other than nasopharyngeal swab, presence of viral mutation(s) within the areas targeted by this assay, and inadequate number of viral copies(<138 copies/mL). A negative result must be combined with clinical observations, patient history, and epidemiological information. The expected result is Negative.  Fact Sheet for Patients:  EntrepreneurPulse.com.au  Fact Sheet for Healthcare Providers:  IncredibleEmployment.be  This test is no t yet approved or cleared by the [place] FDA and  has been authorized for detection and/or diagnosis of SARS-CoV-2 by FDA under an Emergency Use Authorization (EUA). This EUA will remain  in effect (meaning this test  can be used) for the duration of the COVID-19 declaration under Section 564(b)(1) of the Act, 21 U.S.C.section 360bbb-3(b)(1), unless the authorization is terminated  or revoked sooner.       Influenza A by PCR NEGATIVE NEGATIVE Final   Influenza B by PCR NEGATIVE NEGATIVE Final    Comment: (NOTE) The  Xpert Xpress SARS-CoV-2/FLU/RSV plus assay is intended as an aid in the diagnosis of influenza from Nasopharyngeal swab specimens and should not be used as a sole basis for treatment. Nasal washings and aspirates are unacceptable for Xpert Xpress SARS-CoV-2/FLU/RSV testing.  Fact Sheet for Patients: EntrepreneurPulse.com.au  Fact Sheet for Healthcare Providers: IncredibleEmployment.be  This test is not yet approved or cleared by the [place] FDA and has been authorized for detection and/or diagnosis of SARS-CoV-2 by FDA under an Emergency Use Authorization (EUA). This EUA will remain in effect (meaning this test can be used) for the duration of the COVID-19 declaration under Section 564(b)(1) of the Act, 21 U.S.C. section 360bbb-3(b)(1), unless the authorization is terminated or revoked.  Performed at [facility], [place] [address] [place], [place] [address]     Radiology Studies: No results found.      Patina Spanier T. Ramona  If 7PM-7AM, please contact night-coverage [email or web address] 10/24/2020, 3:44 PM

## 2020-10-24 NOTE — Progress Notes (Addendum)
ANTICOAGULATION CONSULT NOTE  Pharmacy Consult for heparin Indication: hx atrial fibrillation (home Eliquis on hold)  No Known Allergies  Patient Measurements: height 71 inches, weight 69 kg  Height: 5\' 11"  (180.3 cm) Weight: 68.7 kg (151 lb 7.3 oz) IBW/kg (Calculated) : 75.3 Heparin Dosing Weight: 69 kg  Vital Signs: Temp: 98.2 F (36.8 C) (07/19 0511) Temp Source: Axillary (07/19 0511) BP: 113/61 (07/19 0511) Pulse Rate: 67 (07/19 0511)  Labs: Recent Labs    10/22/20 0535 10/22/20 1441 10/23/20 0441 10/23/20 0759 10/23/20 1840 10/23/20 2043 10/24/20 0435  HGB 9.8*  --  10.5*  --   --   --  10.2*  HCT 31.7*  --  32.8*  --   --   --  34.3*  PLT 203  --  151  --   --   --  190  APTT  --    < >  --  95*  --  99* 74*  HEPARINUNFRC  --    < >  --  1.06* 0.63  --  0.65  CREATININE 0.92  --  0.88  --   --   --  0.82   < > = values in this interval not displayed.     Estimated Creatinine Clearance: 50 mL/min (by C-G formula based on SCr of 0.82 mg/dL).   Medications:  - on Eliquis 5 mg bid PTA (last dose taken on 10/20/20 at 8a)  Assessment: Patient is a 87 y.o M with hx afib on Eliquis PTA, presented to the ED on 10/20/20 with c/o CP, SOB and abdominal pain.  Abdominal MRI on 7/15 showed choledocholithiasis with a distal CBD stone. Pharmacy has been consulted to dose heparin drip, while Eliquis is on hold.  ERCP planned for 10/25/20.  HL therapeutic at 0.65 and aPTT therapeutic at 74 seconds.  Now that both levels are correlating, can proceed with checking just HL daily.  Hgb stable.    Goal of Therapy:  Heparin level 0.3-0.7 units/ml aPTT 66-102 seconds Monitor platelets by anticoagulation protocol: Yes   Plan:  - Continue heparin drip at 1050 units/hr - Monitor daily HL and CBC - Monitor for s/sx bleeding - F/u transition to Eliquis after surgery  10/27/20, PharmD 10/24/2020 7:48 AM  Addendum: - Per GI, stop heparin drip at 0900 on 7/20

## 2020-10-24 NOTE — Plan of Care (Signed)
  Problem: Clinical Measurements: Goal: Ability to maintain clinical measurements within normal limits will improve Outcome: Progressing   

## 2020-10-24 NOTE — Progress Notes (Signed)
[facility] Progress Note  [name] 87 y.o. [birth year]  CC:  Choledocholithasis   Subjective: Patient denies abdominal pain, nausea, vomiting.   ROS : Review of Systems  Cardiovascular:  Negative for chest pain and palpitations.  Gastrointestinal:  Negative for abdominal pain, blood in stool, constipation, diarrhea, heartburn, melena, nausea and vomiting.   Objective: Vital signs in last 24 hours: Vitals:   10/23/20 2008 10/24/20 0511  BP: 129/63 113/61  Pulse: 62 67  Resp: 18 18  Temp: 98.2 F (36.8 C) 98.2 F (36.8 C)  SpO2: 97% 97%    Physical Exam:  General:  Alert, cooperative, no distress; daughter at bedside  Head:  Normocephalic, without obvious abnormality, atraumatic  Eyes:  Anicteric sclera, EOMs intact  Lungs:   Respirations unlabored  Abdomen:   Soft, non-tender, bowel sounds active all four quadrants  Extremities: Extremities normal, atraumatic, no  edema    Lab Results: Recent Labs    10/23/20 0441 10/23/20 1840 10/24/20 0435  NA 139  --  142  K 4.3  --  4.5  CL 109  --  110  CO2 20*  --  22  GLUCOSE 95  --  98  BUN 12  --  12  CREATININE 0.88  --  0.82  CALCIUM 9.0  --  8.9  MG 2.0 2.0  --   PHOS 2.8 2.9  --     Recent Labs    10/23/20 0441 10/24/20 0435  AST 84* 50*  ALT 91* 68*  ALKPHOS 298* 272*  BILITOT 2.3* 1.6*  PROT 5.9* 5.6*  ALBUMIN 2.9* 2.9*    Recent Labs    10/22/20 0535 10/23/20 0441 10/24/20 0435  WBC 6.3 5.4 4.4  NEUTROABS 3.9  --   --   HGB 9.8* 10.5* 10.2*  HCT 31.7* 32.8* 34.3*  MCV 97.2 94.8 102.1*  PLT 203 151 190    No results for input(s): LABPROT, INR in the last 72 hours.   Assessment: Choledocholithasis: MRI/MRCP on 7/15 revealed choledocholithiasis with a 13 mm distal CBD stone. Common duct measures 9 mm. No intrahepatic ductal dilatation. -T. Bili 1.6 improved from 2.3 yesterday.  T. Bili downtrending, it's unclear if stone is still present or if he passed the stone.  Discussed with Dr. Ewing Schlein - since patient has had recurrence of biliary obstruction, he recommends proceeding with ERCP tomorrow. -AST 50/ ALT 68/ ALP 272, improving -Leukocytosis has resolved  Plan: ERCP tomorrow at 1:15PM with Dr. Ewing Schlein.  Stop Heparin by 9:00AM tomorrow Wednesday 7/20.  NPO at midnight.  Continue supportive care.  Continue to trend LFTs.  [facility] will follow.  Edrick Kins PA-C 10/24/2020, 9:42 AM  Contact #  [phone]

## 2020-10-24 NOTE — Progress Notes (Signed)
Physical Therapy Treatment Patient Details Name: Cory Hansen MRN: [id number] DOB: 09/18/1920 Today's Date: 10/24/2020    History of Present Illness Patient  is a 87 year old male with history of dementia, Covid, Left foot 1st ray amputation, hearing loss, A. Fib on Eliquis, diastolic CHF, nephrolithiasis, HTN and osteoarthritis presenting with chest discomfort, shortness of breath and abdominal pain, and admitted for possible cholangitis in the setting of acute choledocholithiasis. Patient is scheduled for ERCP on 7/20    PT Comments    Pt eager to ambulate and able to tolerate improved distance with seated rest breaks.  Pt assisted to bathroom (required assist for hygiene after BM) and then back to bed per request.    Follow Up Recommendations  Home health PT;Supervision for mobility/OOB     Equipment Recommendations  None recommended by PT    Recommendations for Other Services       Precautions / Restrictions Precautions Precautions: Fall Restrictions Weight Bearing Restrictions: No    Mobility  Bed Mobility Overal bed mobility: Needs Assistance Bed Mobility: Sit to Supine       Sit to supine: Supervision   General bed mobility comments: pt up in recliner on arrival    Transfers Overall transfer level: Needs assistance Equipment used: Rolling walker (2 wheeled) Transfers: Sit to/from Stand Sit to Stand: Min guard         General transfer comment: min/guard for safety  Ambulation/Gait Ambulation/Gait assistance: Min guard Gait Distance (Feet): 240 Feet (total) Assistive device: Rolling walker (2 wheeled) Gait Pattern/deviations: Step-through pattern;Decreased stride length;Decreased stance time - right     General Gait Details: pt required two seated rest breaks due to LE fatigue and weakness, family typically follows pt with a w/c at home for safety.   Stairs             Wheelchair Mobility    Modified Rankin (Stroke Patients Only)        Balance                                            Cognition Arousal/Alertness: Awake/alert Behavior During Therapy: WFL for tasks assessed/performed Overall Cognitive Status: Within Functional Limits for tasks assessed                                 General Comments: HOH but appropriate      Exercises      General Comments        Pertinent Vitals/Pain Pain Assessment: No/denies pain    Home Living                      Prior Function            PT Goals (current goals can now be found in the care plan section) Progress towards PT goals: Progressing toward goals    Frequency    Min 3X/week      PT Plan Current plan remains appropriate    Co-evaluation              AM-PAC PT "6 Clicks" Mobility   Outcome Measure  Help needed turning from your back to your side while in a flat bed without using bedrails?: A Little Help needed moving from lying on your back to sitting on the side of a flat  bed without using bedrails?: A Little Help needed moving to and from a bed to a chair (including a wheelchair)?: A Little Help needed standing up from a chair using your arms (e.g., wheelchair or bedside chair)?: A Little Help needed to walk in hospital room?: A Little Help needed climbing 3-5 steps with a railing? : A Lot 6 Click Score: 17    End of Session Equipment Utilized During Treatment: Gait belt Activity Tolerance: Patient tolerated treatment well Patient left: with call bell/phone within reach;in bed Nurse Communication: Mobility status PT Visit Diagnosis: Difficulty in walking, not elsewhere classified (R26.2)     Time: 5830-9407 PT Time Calculation (min) (ACUTE ONLY): 27 min  Charges:  $Gait Training: 23-37 mins                     Thomasene Mohair PT, DPT Acute Rehabilitation Services Pager: [phone] Office: [phone]    Maida Sale E 10/24/2020, 2:27 PM

## 2020-10-25 ENCOUNTER — Encounter (HOSPITAL_COMMUNITY): Payer: Self-pay | Admitting: Internal Medicine

## 2020-10-25 ENCOUNTER — Encounter (HOSPITAL_COMMUNITY)
Admission: EM | Disposition: A | Discharge status: Home / Self Care | Payer: Self-pay | Source: Home / Self Care | Attending: Student

## 2020-10-25 ENCOUNTER — Inpatient Hospital Stay (HOSPITAL_COMMUNITY): Payer: Medicare Other | Admitting: Anesthesiology

## 2020-10-25 ENCOUNTER — Inpatient Hospital Stay (HOSPITAL_COMMUNITY): Payer: Medicare Other

## 2020-10-25 DIAGNOSIS — Z8551 Personal history of malignant neoplasm of bladder: Secondary | ICD-10-CM | POA: Diagnosis not present

## 2020-10-25 DIAGNOSIS — K8309 Other cholangitis: Secondary | ICD-10-CM | POA: Diagnosis not present

## 2020-10-25 DIAGNOSIS — K8050 Calculus of bile duct without cholangitis or cholecystitis without obstruction: Secondary | ICD-10-CM | POA: Diagnosis not present

## 2020-10-25 DIAGNOSIS — A419 Sepsis, unspecified organism: Secondary | ICD-10-CM | POA: Diagnosis not present

## 2020-10-25 HISTORY — PX: ERCP: SHX5425

## 2020-10-25 HISTORY — PX: SPHINCTEROTOMY: SHX5279

## 2020-10-25 HISTORY — PX: REMOVAL OF STONES: SHX5545

## 2020-10-25 LAB — RETICULOCYTES
Immature Retic Fract: 18.9 % — ABNORMAL HIGH (ref 2.3–15.9)
RBC.: 3.5 MIL/uL — ABNORMAL LOW (ref 4.22–5.81)
Retic Count, Absolute: 35.7 10*3/uL (ref 19.0–186.0)
Retic Ct Pct: 1 % (ref 0.4–3.1)

## 2020-10-25 LAB — COMPREHENSIVE METABOLIC PANEL
ALT: 65 U/L — ABNORMAL HIGH (ref 0–44)
AST: 47 U/L — ABNORMAL HIGH (ref 15–41)
Albumin: 3.2 g/dL — ABNORMAL LOW (ref 3.5–5.0)
Alkaline Phosphatase: 290 U/L — ABNORMAL HIGH (ref 38–126)
Anion gap: 6 (ref 5–15)
BUN: 13 mg/dL (ref 8–23)
CO2: 27 mmol/L (ref 22–32)
Calcium: 9.3 mg/dL (ref 8.9–10.3)
Chloride: 111 mmol/L (ref 98–111)
Creatinine, Ser: 0.85 mg/dL (ref 0.61–1.24)
GFR, Estimated: >60 mL/min (ref >60)
Glucose, Bld: 107 mg/dL — ABNORMAL HIGH (ref 70–99)
Potassium: 4.7 mmol/L (ref 3.5–5.1)
Sodium: 144 mmol/L (ref 135–145)
Total Bilirubin: 1.6 mg/dL — ABNORMAL HIGH (ref 0.3–1.2)
Total Protein: 6.1 g/dL — ABNORMAL LOW (ref 6.5–8.1)

## 2020-10-25 LAB — CBC
HCT: 34.4 % — ABNORMAL LOW (ref 39.0–52.0)
Hemoglobin: 10.6 g/dL — ABNORMAL LOW (ref 13.0–17.0)
MCH: 30.1 pg (ref 26.0–34.0)
MCHC: 30.8 g/dL (ref 30.0–36.0)
MCV: 97.7 fL (ref 80.0–100.0)
Platelets: 196 10*3/uL (ref 150–400)
RBC: 3.52 MIL/uL — ABNORMAL LOW (ref 4.22–5.81)
RDW: 15.3 % (ref 11.5–15.5)
WBC: 4.9 10*3/uL (ref 4.0–10.5)
nRBC: 0 % (ref 0.0–0.2)

## 2020-10-25 LAB — IRON AND TIBC
Iron: 27 ug/dL — ABNORMAL LOW (ref 45–182)
Saturation Ratios: 7 % — ABNORMAL LOW (ref 17.9–39.5)
TIBC: 361 ug/dL (ref 250–450)
UIBC: 334 ug/dL

## 2020-10-25 LAB — FERRITIN: Ferritin: 36 ng/mL (ref 24–336)

## 2020-10-25 LAB — HEPARIN LEVEL (UNFRACTIONATED): Heparin Unfractionated: 0.49 IU/mL (ref 0.30–0.70)

## 2020-10-25 LAB — FOLATE: Folate: 13.6 ng/mL (ref >5.9)

## 2020-10-25 LAB — MAGNESIUM: Magnesium: 2.3 mg/dL (ref 1.7–2.4)

## 2020-10-25 LAB — PHOSPHORUS: Phosphorus: 3.4 mg/dL (ref 2.5–4.6)

## 2020-10-25 LAB — VITAMIN B12: Vitamin B-12: 475 pg/mL (ref 180–914)

## 2020-10-25 SURGERY — ERCP, WITH INTERVENTION IF INDICATED
Anesthesia: General

## 2020-10-25 MED ORDER — GLUCAGON HCL RDNA (DIAGNOSTIC) 1 MG IJ SOLR
INTRAMUSCULAR | Status: AC
  Filled 2020-10-25: qty 1

## 2020-10-25 MED ORDER — DEXAMETHASONE SODIUM PHOSPHATE 10 MG/ML IJ SOLN
INTRAMUSCULAR | Status: DC | PRN
  Administered 2020-10-25: 10 mg via INTRAVENOUS

## 2020-10-25 MED ORDER — INDOMETHACIN 50 MG RE SUPP
RECTAL | Status: AC
  Filled 2020-10-25: qty 1

## 2020-10-25 MED ORDER — CIPROFLOXACIN IN D5W 400 MG/200ML IV SOLN
INTRAVENOUS | Status: AC
  Filled 2020-10-25: qty 200

## 2020-10-25 MED ORDER — PROPOFOL 10 MG/ML IV BOLUS
INTRAVENOUS | Status: AC
  Filled 2020-10-25: qty 20

## 2020-10-25 MED ORDER — SUGAMMADEX SODIUM 200 MG/2ML IV SOLN
INTRAVENOUS | Status: DC | PRN
  Administered 2020-10-25: 200 mg via INTRAVENOUS

## 2020-10-25 MED ORDER — LABETALOL HCL 5 MG/ML IV SOLN: 10.0000 mg | INTRAVENOUS | Status: DC | PRN

## 2020-10-25 MED ORDER — METOPROLOL TARTRATE 25 MG PO TABS
25.0000 mg | ORAL_TABLET | Freq: Two times a day (BID) | ORAL | Status: DC
  Administered 2020-10-25: 25 mg via ORAL
  Filled 2020-10-25: qty 1

## 2020-10-25 MED ORDER — ONDANSETRON HCL 4 MG/2ML IJ SOLN
INTRAMUSCULAR | Status: DC | PRN
  Administered 2020-10-25: 4 mg via INTRAVENOUS

## 2020-10-25 MED ORDER — TAMSULOSIN HCL 0.4 MG PO CAPS
0.4000 mg | ORAL_CAPSULE | Freq: Every day | ORAL | Status: DC
  Administered 2020-10-25 – 2020-10-26 (×2): 0.4 mg via ORAL
  Filled 2020-10-25 (×2): qty 1

## 2020-10-25 MED ORDER — FENTANYL CITRATE (PF) 100 MCG/2ML IJ SOLN
INTRAMUSCULAR | Status: AC
  Filled 2020-10-25: qty 2

## 2020-10-25 MED ORDER — LIDOCAINE 2% (20 MG/ML) 5 ML SYRINGE
INTRAMUSCULAR | Status: DC | PRN
  Administered 2020-10-25: 100 mg via INTRAVENOUS

## 2020-10-25 MED ORDER — FENTANYL CITRATE (PF) 100 MCG/2ML IJ SOLN
INTRAMUSCULAR | Status: DC | PRN
  Administered 2020-10-25: 25 ug via INTRAVENOUS

## 2020-10-25 MED ORDER — PROPOFOL 10 MG/ML IV BOLUS
INTRAVENOUS | Status: DC | PRN
  Administered 2020-10-25: 80 mg via INTRAVENOUS

## 2020-10-25 MED ORDER — INDOMETHACIN 50 MG RE SUPP
RECTAL | Status: DC | PRN
  Administered 2020-10-25: 100 mg via RECTAL

## 2020-10-25 MED ORDER — LACTATED RINGERS IV SOLN
INTRAVENOUS | Status: DC
  Administered 2020-10-25: 1000 mL via INTRAVENOUS

## 2020-10-25 MED ORDER — ROCURONIUM BROMIDE 10 MG/ML (PF) SYRINGE
PREFILLED_SYRINGE | INTRAVENOUS | Status: DC | PRN
  Administered 2020-10-25: 70 mg via INTRAVENOUS

## 2020-10-25 MED ORDER — SODIUM CHLORIDE 0.9 % IV SOLN
INTRAVENOUS | Status: DC | PRN
  Administered 2020-10-25: 20 mL

## 2020-10-25 NOTE — Anesthesia Postprocedure Evaluation (Signed)
Anesthesia Post Note  Patient: Cory Hansen  Procedure(s) Performed: ENDOSCOPIC RETROGRADE CHOLANGIOPANCREATOGRAPHY (ERCP) SPHINCTEROTOMY REMOVAL OF STONES     Patient location during evaluation: PACU Anesthesia Type: General Level of consciousness: awake and alert Pain management: pain level controlled Vital Signs Assessment: post-procedure vital signs reviewed and stable Respiratory status: spontaneous breathing, nonlabored ventilation, respiratory function stable and patient connected to nasal cannula oxygen Cardiovascular status: blood pressure returned to baseline and stable Postop Assessment: no apparent nausea or vomiting Anesthetic complications: no   No notable events documented.  Last Vitals:  Vitals:   10/25/20 1410 10/25/20 1420  BP: (!) 159/72 (!) 158/73  Pulse: 73 71  Resp: (!) 23 18  Temp: 36.6 C   SpO2: 100% 96%    Last Pain:  Vitals:   10/25/20 1410  TempSrc: Oral  PainSc: Asleep                 [name] S

## 2020-10-25 NOTE — Progress Notes (Signed)
ANTICOAGULATION CONSULT NOTE  Pharmacy Consult for heparin Indication: hx atrial fibrillation (home Eliquis on hold)  No Known Allergies  Patient Measurements: height 71 inches, weight 69 kg  Height: 5\' 11"  (180.3 cm) Weight: 68.7 kg (151 lb 7.3 oz) IBW/kg (Calculated) : 75.3 Heparin Dosing Weight: 69 kg  Vital Signs: Temp: 97.8 F (36.6 C) (07/20 0507) Temp Source: Oral (07/20 0507) BP: 142/76 (07/20 0507) Pulse Rate: 73 (07/20 0507)  Labs: Recent Labs    10/23/20 0441 10/23/20 0759 10/23/20 1840 10/23/20 2043 10/24/20 0435 10/25/20 0427  HGB 10.5*  --   --   --  10.2* 10.6*  HCT 32.8*  --   --   --  34.3* 34.4*  PLT 151  --   --   --  190 196  APTT  --  95*  --  99* 74*  --   HEPARINUNFRC  --  1.06* 0.63  --  0.65 0.49  CREATININE 0.88  --   --   --  0.82 0.85     Estimated Creatinine Clearance: 48.3 mL/min (by C-G formula based on SCr of 0.85 mg/dL).   Medications:  - on Eliquis 5 mg bid PTA (last dose taken on 10/20/20 at 8a)  Assessment: Patient is a 87 y.o M with hx afib on Eliquis PTA, presented to the ED on 10/20/20 with c/o CP, SOB and abdominal pain.  Abdominal MRI on 7/15 showed choledocholithiasis with a distal CBD stone. Pharmacy has been consulted to dose heparin drip, while Eliquis is on hold.  ERCP planned for 10/25/20.  HL therapeutic at 0.49.  CBC stable.   Goal of Therapy:  Heparin level 0.3-0.7 units/ml aPTT 66-102 seconds Monitor platelets by anticoagulation protocol: Yes   Plan:  - Continue heparin drip at 1050 units/hr - Stop heparin drip at 0900 for surgery today - Monitor daily HL and CBC - Monitor for s/sx bleeding - F/u transition back to Eliquis after surgery  10/27/20, PharmD 10/25/2020 7:07 AM

## 2020-10-25 NOTE — Progress Notes (Signed)
PROGRESS NOTE  Cory Hansen:[id number] DOB: 04-25-1920   PCP: Hoyt Koch, MD  Patient is from: Home  DOA: 10/20/2020 LOS: 5  Chief complaints:  Chief Complaint  Patient presents with   Chest Pain     Brief Narrative / Interim history: 87 year old M with history of dementia, A. Fib on Eliquis, diastolic CHF, nephrolithiasis, HTN and osteoarthritis presenting with chest discomfort, shortness of breath and abdominal pain, and admitted for possible cholangitis in the setting of acute choledocholithiasis measuring about 13 mm with CBD to 9 mm on MRCP.  He was a started on IV meropenem.  GI consulted.  Underwent ERCP with removal of choledocholithiasis by biliary sphincterectomy and balloon extraction.  GI recommended holding anticoagulation for 24 hours, preferably 48 hours after procedure. Per surgery, no plan for lap chole as there is no evidence of cholecystitis.  Remains on IV Unasyn.  Subjective: Seen and examined earlier this morning before he went down for ERCP.  No major events overnight of this morning.  He reports some urinary frequency and sense of incomplete void.  He was recently started on Flomax by urology.  He denies dysuria or hematuria.  Denies GI symptoms.  Patient's youngest daughter at bedside.  Objective: Vitals:   10/25/20 1408 10/25/20 1410 10/25/20 1420 10/25/20 1430  BP: (!) 173/73 (!) 159/72 (!) 158/73 (!) 170/114  Pulse:  73 71 73  Resp:  (!) '23 18 19  ' Temp:  97.8 F (36.6 C)    TempSrc:  Oral    SpO2:  100% 96% 98%  Weight:      Height:        Intake/Output Summary (Last 24 hours) at 10/25/2020 1719 Last data filed at 10/25/2020 1701 Gross per 24 hour  Intake 2275.22 ml  Output --  Net 2275.22 ml   Filed Weights   10/22/20 1350 10/25/20 1254  Weight: 68.7 kg 68.7 kg    Examination:   GENERAL: No apparent distress.  Nontoxic. HEENT: MMM.  Vision and hearing grossly intact.  NECK: Supple.  No apparent JVD.  RESP: On RA.  No  IWOB.  Fair aeration bilaterally. CVS:  RRR. Heart sounds normal.  ABD/GI/GU: BS+. Abd soft, NTND.  No suprapubic or CVA tenderness. MSK/EXT:  Moves extremities. No apparent deformity. No edema.  SKIN: no apparent skin lesion or wound NEURO: Awake and alert. Oriented appropriately.  No apparent focal neuro deficit. PSYCH: Calm. Normal affect.   Procedures:  None  Microbiology summarized: [id number] and influenza PCR nonreactive.  Assessment & Plan: Severe sepsis due to acute cholangitis in the setting of choledocholithiasis-POA.  Patient met criteria on presentation with leukocytosis, tachycardia, tachypnea and hypotension.  Sepsis physiology resolved.  LFT improving. -7/20-ERCP with removal of choledocholithiasis by biliary sphincterectomy and balloon extraction.   -GI recommended holding anticoagulation for 24 hours, preferably 48 hours after procedure.  -CLD for 6 hours post ERCP followed by soft diet -IV meropenem 7/16-7/18>> IV Unasyn 7/18>> -Continue IVF at 50 cc an hour  Elevated LFTs/hyperbilirubinemia: Due to #1.  Improved. Recent Labs  Lab 10/21/20 0404 10/22/20 0535 10/23/20 0441 10/24/20 0435 10/25/20 0427  AST 137* 83* 84* 50* 47*  ALT 121* 97* 91* 68* 65*  ALKPHOS 335* 290* 298* 272* 290*  BILITOT 4.0* 4.1* 2.3* 1.6* 1.6*  PROT 6.1* 5.9* 5.9* 5.6* 6.1*  ALBUMIN 3.1* 3.0* 2.9* 2.9* 3.2*  -Recheck in the morning   Chronic diastolic CHF: Limited TTE in 06/2020 with LVEF of 55 to 60%, RVSP of  44.7.  Appears compensated.  On Lasix at home. -Continue holding Lasix -Discontinue IV fluid  Uncontrolled hypertension: BP elevated since last night.  Not tachycardic. -Discontinue IV fluid -Increase metoprolol to 25 mg twice daily -Add as needed labetalol  Paroxysmal A. fib: Rate controlled.  On metoprolol and Eliquis at home. -Metoprolol as above. -GI recommends holding anticoagulation for 24 hours, preferably for 48 hours  Iron deficiency anemia: H&H stable after  initial drop likely from IV fluid. Recent Labs    06/19/20 1715 06/20/20 0803 06/28/20 0432 06/30/20 2106 10/20/20 1651 10/21/20 0404 10/22/20 0535 10/23/20 0441 10/24/20 0435 10/25/20 0427  HGB 12.0* 12.3* 14.7 14.3 12.1* 10.4* 9.8* 10.5* 10.2* 10.6*  -Consider IV iron prior to discharge. -Discontinue IV fluid -Recheck in the morning -Monitor.  Dementia without behavioral disturbance: Seems to be fairly oriented -Reorientation and delirium precautions  History of bladder cancer/BPH with LUTS -Started Flomax.  GERD:  -Continue Protonix  Inadequate oral intake Body mass index is 21.12 kg/m. Nutrition Problem: Inadequate oral intake Etiology: acute illness Signs/Symptoms: per patient/family report     DVT prophylaxis:  SCDs Start: 10/21/20 0134  Code Status: DNR/DNI Family Communication: Patient and/or RN.  Updated patient's youngest daughter at bedside Level of care: Telemetry Status is: Inpatient  Remains inpatient appropriate because:IV treatments appropriate due to intensity of illness or inability to take PO and Inpatient level of care appropriate due to severity of illness  Dispo: The patient is from: Home              Anticipated d/c is to: Home              Patient currently is not medically stable to d/c.   Difficult to place patient No       Consultants:  Gastroenterology-signed off General surgery-signed off   Sch Meds:  Scheduled Meds:  feeding supplement  237 mL Oral TID BM   metoprolol tartrate  12.5 mg Oral BID   multivitamin with minerals  1 tablet Oral Daily   pantoprazole  40 mg Oral Daily   tamsulosin  0.4 mg Oral Daily   Continuous Infusions:  sodium chloride 50 mL/hr at 10/25/20 0636   ampicillin-sulbactam (UNASYN) IV 3 g (10/25/20 0606)   PRN Meds:.ondansetron **OR** ondansetron (ZOFRAN) IV, polyvinyl alcohol  Antimicrobials: Anti-infectives (From admission, onward)    Start     Dose/Rate Route Frequency Ordered Stop    10/23/20 1245  Ampicillin-Sulbactam (UNASYN) 3 g in sodium chloride 0.9 % 100 mL IVPB        3 g 200 mL/hr over 30 Minutes Intravenous Every 6 hours 10/23/20 1150     10/21/20 0130  meropenem (MERREM) 1 g in sodium chloride 0.9 % 100 mL IVPB  Status:  Discontinued        1 g 200 mL/hr over 30 Minutes Intravenous Every 12 hours 10/21/20 0048 10/23/20 1139        I have personally reviewed the following labs and images: CBC: Recent Labs  Lab 10/20/20 1651 10/21/20 0404 10/22/20 0535 10/23/20 0441 10/24/20 0435 10/25/20 0427  WBC 11.4* 16.0* 6.3 5.4 4.4 4.9  NEUTROABS 9.8*  --  3.9  --   --   --   HGB 12.1* 10.4* 9.8* 10.5* 10.2* 10.6*  HCT 39.5 33.9* 31.7* 32.8* 34.3* 34.4*  MCV 98.5 99.7 97.2 94.8 102.1* 97.7  PLT 271 196 203 151 190 196   BMP &GFR Recent Labs  Lab 10/21/20 0404 10/22/20 0535 10/23/20 0441 10/23/20 1840  10/24/20 0435 10/25/20 0427  NA 137 138 139  --  142 144  K 5.1 4.4 4.3  --  4.5 4.7  CL 108 109 109  --  110 111  CO2 20* 24 20*  --  22 27  GLUCOSE 206* 92 95  --  98 107*  BUN '17 16 12  ' --  12 13  CREATININE 0.89 0.92 0.88  --  0.82 0.85  CALCIUM 8.5* 8.7* 9.0  --  8.9 9.3  MG  --  2.2 2.0 2.0  --  2.3  PHOS  --  3.5 2.8 2.9  --  3.4   Estimated Creatinine Clearance: 48.3 mL/min (by C-G formula based on SCr of 0.85 mg/dL). Liver & Pancreas: Recent Labs  Lab 10/21/20 0404 10/22/20 0535 10/23/20 0441 10/24/20 0435 10/25/20 0427  AST 137* 83* 84* 50* 47*  ALT 121* 97* 91* 68* 65*  ALKPHOS 335* 290* 298* 272* 290*  BILITOT 4.0* 4.1* 2.3* 1.6* 1.6*  PROT 6.1* 5.9* 5.9* 5.6* 6.1*  ALBUMIN 3.1* 3.0* 2.9* 2.9* 3.2*   Recent Labs  Lab 10/20/20 1651 10/23/20 0441  LIPASE 47 30   No results for input(s): AMMONIA in the last 168 hours. Diabetic: No results for input(s): HGBA1C in the last 72 hours. No results for input(s): GLUCAP in the last 168 hours. Cardiac Enzymes: No results for input(s): CKTOTAL, CKMB, CKMBINDEX, TROPONINI in the  last 168 hours. No results for input(s): PROBNP in the last 8760 hours. Coagulation Profile: No results for input(s): INR, PROTIME in the last 168 hours. Thyroid Function Tests: No results for input(s): TSH, T4TOTAL, FREET4, T3FREE, THYROIDAB in the last 72 hours. Lipid Profile: No results for input(s): CHOL, HDL, LDLCALC, TRIG, CHOLHDL, LDLDIRECT in the last 72 hours. Anemia Panel: Recent Labs    10/25/20 0427  VITAMINB12 475  FOLATE 13.6  FERRITIN 36  TIBC 361  IRON 27*  RETICCTPCT 1.0   Urine analysis:    Component Value Date/Time   COLORURINE YELLOW 06/30/2020 0305   APPEARANCEUR CLEAR 06/30/2020 0305   LABSPEC 1.019 06/30/2020 0305   PHURINE 6.0 06/30/2020 0305   GLUCOSEU NEGATIVE 06/30/2020 0305   GLUCOSEU NEGATIVE 10/31/2017 1407   HGBUR NEGATIVE 06/30/2020 0305   BILIRUBINUR NEGATIVE 06/30/2020 0305   KETONESUR NEGATIVE 06/30/2020 0305   PROTEINUR NEGATIVE 06/30/2020 0305   UROBILINOGEN 0.2 10/31/2017 1407   NITRITE NEGATIVE 06/30/2020 0305   LEUKOCYTESUR NEGATIVE 06/30/2020 0305   Sepsis Labs: Invalid input(s): PROCALCITONIN, [place]  Microbiology: Recent Results (from the past 240 hour(s))  Resp Panel by RT-PCR (Flu A&B, Covid) Nasopharyngeal Swab     Status: None   Collection Time: 10/20/20  4:49 PM   Specimen: Nasopharyngeal Swab; Nasopharyngeal(NP) swabs in vial transport medium  Result Value Ref Range Status   SARS Coronavirus 2 by RT PCR NEGATIVE NEGATIVE Final    Comment: (NOTE) SARS-CoV-2 target nucleic acids are NOT DETECTED.  The SARS-CoV-2 RNA is generally detectable in upper respiratory specimens during the acute phase of infection. The lowest concentration of SARS-CoV-2 viral copies this assay can detect is 138 copies/mL. A negative result does not preclude SARS-Cov-2 infection and should not be used as the sole basis for treatment or other patient management decisions. A negative result may occur with  improper specimen  collection/handling, submission of specimen other than nasopharyngeal swab, presence of viral mutation(s) within the areas targeted by this assay, and inadequate number of viral copies(<138 copies/mL). A negative result must be combined with clinical observations, patient  history, and epidemiological information. The expected result is Negative.  Fact Sheet for Patients:  EntrepreneurPulse.com.au  Fact Sheet for Healthcare Providers:  IncredibleEmployment.be  This test is no t yet approved or cleared by the [place] FDA and  has been authorized for detection and/or diagnosis of SARS-CoV-2 by FDA under an Emergency Use Authorization (EUA). This EUA will remain  in effect (meaning this test can be used) for the duration of the COVID-19 declaration under Section 564(b)(1) of the Act, 21 U.S.C.section 360bbb-3(b)(1), unless the authorization is terminated  or revoked sooner.       Influenza A by PCR NEGATIVE NEGATIVE Final   Influenza B by PCR NEGATIVE NEGATIVE Final    Comment: (NOTE) The Xpert Xpress SARS-CoV-2/FLU/RSV plus assay is intended as an aid in the diagnosis of influenza from Nasopharyngeal swab specimens and should not be used as a sole basis for treatment. Nasal washings and aspirates are unacceptable for Xpert Xpress SARS-CoV-2/FLU/RSV testing.  Fact Sheet for Patients: EntrepreneurPulse.com.au  Fact Sheet for Healthcare Providers: IncredibleEmployment.be  This test is not yet approved or cleared by the [place] FDA and has been authorized for detection and/or diagnosis of SARS-CoV-2 by FDA under an Emergency Use Authorization (EUA). This EUA will remain in effect (meaning this test can be used) for the duration of the COVID-19 declaration under Section 564(b)(1) of the Act, 21 U.S.C. section 360bbb-3(b)(1), unless the authorization is terminated or revoked.  Performed at [facility], [place] [place], [place] [address]     Radiology Studies: DG ERCP  Result Date: 10/25/2020 CLINICAL DATA:  Choledocholithiasis EXAM: ERCP TECHNIQUE: Multiple spot images obtained with the fluoroscopic device and submitted for interpretation post-procedure. FLUOROSCOPY TIME:  Fluoroscopy Time:  3 minutes 10 seconds Radiation Exposure Index (if provided by the fluoroscopic device): 40.5 mGy Number of Acquired Spot Images: 0 COMPARISON:  MRCP 10/20/2020 FINDINGS: Twenty-five intraoperative saved images are submitted for review. The images demonstrate a flexible duodenal scope in the descending duodenum with wire cannulation of the common bile duct followed by sphincterotomy, cholangiography and balloon sweeping of the common bile duct. A filling defect is present in the distal common bile duct at the beginning of the study but is no longer evident on the final images. IMPRESSION: 1. Choledocholithiasis. 2. ERCP with sphincterotomy and balloon sweeping of the common duct. These images were submitted for radiologic interpretation only. Please see the procedural report for the amount of contrast and the fluoroscopy time utilized. Electronically Signed   By: Jacqulynn Cadet M.D.   On: 10/25/2020 15:03        [address] [place]  If 7PM-7AM, please contact night-coverage [email or web address] 10/25/2020, 5:19 PM

## 2020-10-25 NOTE — Transfer of Care (Signed)
Immediate Anesthesia Transfer of Care Note  Patient: Cory Hansen  Procedure(s) Performed: ENDOSCOPIC RETROGRADE CHOLANGIOPANCREATOGRAPHY (ERCP) SPHINCTEROTOMY REMOVAL OF STONES  Patient Location: Endoscopy Unit  Anesthesia Type:General  Level of Consciousness: awake, alert  and oriented  Airway & Oxygen Therapy: Patient Spontanous Breathing and Patient connected to face mask oxygen  Post-op Assessment: Report given to RN and Post -op Vital signs reviewed and stable  Post vital signs: Reviewed and stable  Last Vitals:  Vitals Value Taken Time  BP 173/73 10/25/20 1408  Temp    Pulse 71 10/25/20 1410  Resp 20 10/25/20 1410  SpO2 100 % 10/25/20 1410  Vitals shown include unvalidated device data.  Last Pain:  Vitals:   10/25/20 1254  TempSrc: Tympanic  PainSc: 0-No pain         Complications: No notable events documented.

## 2020-10-25 NOTE — Progress Notes (Signed)
Cory Hansen 12:58 PM  Subjective: Patient currently doing well and his hospital computer chart reviewed and his case discussed with our PA and he has no new complaints and his GI symptoms have resolved  Objective: Vital signs stable afebrile no acute distress elderly pleasant exam please see preassessment evaluation liver tests continue to improve CBC okay MRCP reviewed  Assessment: CBD stone  Plan: We rediscussed the procedure and okay to proceed today with anesthesia assistance  [facility]  office [phone] After 5PM or if no answer call [phone]

## 2020-10-25 NOTE — Anesthesia Procedure Notes (Signed)
Procedure Name: Intubation Date/Time: 10/25/2020 1:19 PM Performed by: Pearson Grippe, CRNA Pre-anesthesia Checklist: Patient identified, Emergency Drugs available, Suction available and Patient being monitored Patient Re-evaluated:Patient Re-evaluated prior to induction Oxygen Delivery Method: Circle system utilized Preoxygenation: Pre-oxygenation with 100% oxygen Induction Type: IV induction Ventilation: Mask ventilation without difficulty Laryngoscope Size: Miller and 2 Grade View: Grade I Tube type: Oral Tube size: 7.5 mm Number of attempts: 1 Airway Equipment and Method: Stylet and Oral airway Placement Confirmation: ETT inserted through vocal cords under direct vision, positive ETCO2 and breath sounds checked- equal and bilateral Secured at: 22 cm Tube secured with: Tape Dental Injury: Teeth and Oropharynx as per pre-operative assessment

## 2020-10-25 NOTE — Anesthesia Preprocedure Evaluation (Signed)
Anesthesia Evaluation  Patient identified by MRN, date of birth, ID band Patient awake    Reviewed: Allergy & Precautions, NPO status , Patient's Chart, lab work & pertinent test results  Airway Mallampati: II  TM Distance: >3 FB Neck ROM: Full    Dental no notable dental hx.    Pulmonary neg pulmonary ROS,    Pulmonary exam normal breath sounds clear to auscultation       Cardiovascular Normal cardiovascular exam+ dysrhythmias Atrial Fibrillation  Rhythm:Regular Rate:Normal  . Left ventricular ejection fraction, by estimation, is 55 to 60%. The  left ventricle has normal function. The left ventricle has no regional  wall motion abnormalities. Left ventricular diastolic function could not  be evaluated.  2. Right ventricular systolic function is normal. The right ventricular  size is normal. There is mildly elevated pulmonary artery systolic  pressure. The estimated right ventricular systolic pressure is 44.7 mmHg.  3. Left atrial size was mildly dilated.  4. The mitral valve is normal in structure. Mild to moderate mitral valve  regurgitation. No evidence of mitral stenosis.  5. Tricuspid valve regurgitation is moderate.  6. The aortic valve is normal in structure. Aortic valve regurgitation is  mild. Mild aortic valve sclerosis is present, with no evidence of aortic  valve stenosis.  7. The inferior vena cava is normal in size with greater than 50%  respiratory variability, suggesting right atrial pressure of 3 mmHg   Neuro/Psych Dementia negative neurological ROS     GI/Hepatic negative GI ROS, Neg liver ROS,   Endo/Other  negative endocrine ROS  Renal/GU negative Renal ROS  negative genitourinary   Musculoskeletal negative musculoskeletal ROS (+)   Abdominal   Peds negative pediatric ROS (+)  Hematology  (+) anemia ,   Anesthesia Other Findings   Reproductive/Obstetrics negative OB ROS                              Anesthesia Physical Anesthesia Plan  ASA: 3  Anesthesia Plan: General   Post-op Pain Management:    Induction: Intravenous  PONV Risk Score and Plan: 2 and Ondansetron, Dexamethasone and Treatment may vary due to age or medical condition  Airway Management Planned: Oral ETT  Additional Equipment:   Intra-op Plan:   Post-operative Plan: Extubation in OR  Informed Consent: I have reviewed the patients History and Physical, chart, labs and discussed the procedure including the risks, benefits and alternatives for the proposed anesthesia with the patient or authorized representative who has indicated his/her understanding and acceptance.     Dental advisory given  Plan Discussed with: CRNA and Surgeon  Anesthesia Plan Comments:         Anesthesia Quick Evaluation

## 2020-10-25 NOTE — Op Note (Signed)
[facility] Patient Name: Cory Hansen Procedure Date: 10/25/2020 MRN: [id number] Attending MD: Vida Rigger , MD Date of Birth: 20-Feb-1921 CSN: [id number] Age: 87 Admit Type: Inpatient Procedure:                ERCP Indications:              For therapy of bile duct stone(s) Providers:                Vida Rigger, MD, Janae Sauce. Steele Berg, RN, Adolph Pollack, RN, Sunday Corn Mbumina, Technician Referring MD:              Medicines:                General Anesthesia Complications:            No immediate complications. Estimated Blood Loss:     Estimated blood loss was minimal. With minimal                            oozing with passage of the stone and the balloon                            which stopped readily on its own Procedure:                Pre-Anesthesia Assessment:                           - Prior to the procedure, a History and Physical                            was performed, and patient medications and                            allergies were reviewed. The patient's tolerance of                            previous anesthesia was also reviewed. The risks                            and benefits of the procedure and the sedation                            options and risks were discussed with the patient.                            All questions were answered, and informed consent                            was obtained. Prior Anticoagulants: The patient has                            taken heparin, last dose was day of procedure. ASA  Grade Assessment: III - A patient with severe                            systemic disease. After reviewing the risks and                            benefits, the patient was deemed in satisfactory                            condition to undergo the procedure.                           After obtaining informed consent, the scope was                            passed under direct  vision. Throughout the                            procedure, the patient's blood pressure, pulse, and                            oxygen saturations were monitored continuously. The                            Olympus [id number]-Q180V [id number]) was introduced through                            the mouth, and used to inject contrast into and                            used to locate the major papilla. The ERCP was                            accomplished without difficulty. The patient                            tolerated the procedure well. Scope In: Scope Out: Findings:      The major papilla was normal. Deep selective cannulation was readily       obtained on the first attempt and probably 2 obvious stones were seen       and we proceeded with a biliary sphincterotomy was made with a Hydratome       sphincterotome using ERBE electrocautery. There was no       post-sphincterotomy bleeding. We proceeded until we had adequate biliary       drainage and could get the fully bowed sphincterotome easily in and out       of the duct and the lower third of the main bile duct and hepatic duct       bifurcation contained two stones, the largest of which was medium-sized       in diameter. The biliary tree was swept with an adjustable 12- 15 mm       balloon starting at the bifurcation and right main hepatic duct. Sludge       was swept from the duct. All stones were removed.  Nothing was found on       subsequent balloon pull-through's. And the 15 mm balloon passed through       the patent sphincterotomy multiple times without resistance and on       occlusion cholangiogram there was no residual abnormality and there was       adequate biliary drainage at the end of the procedure and the wire and       the balloon and the scope were removed and the patient tolerated the       procedure well not mentioned above there was no pancreatic duct       injection or wire advancement throughout the  procedure Impression:               - The major papilla appeared normal.                           - Choledocholithiasis was found. Complete removal                            was accomplished by biliary sphincterotomy and                            balloon extraction.                           - A biliary sphincterotomy was performed.                           - The biliary tree was swept and nothing was found. Moderate Sedation:      Not Applicable - Patient had care per Anesthesia. Recommendation:           - Clear liquid diet for 6 hours. If doing well this                            evening may have soft solid                           - Resume anticoagulant medication at prior dose                            tomorrow or preferably Friday if you are                            comfortable waiting that long subcu Lovenox or                            heparin tomorrow would be fine.                           - Return to GI clinic PRN. And follow liver tests                            back to normal                           - Telephone GI clinic if  symptomatic PRN. Procedure Code(s):        --- Professional ---                           [phone], Esophagogastroduodenoscopy, flexible,                            transoral; diagnostic, including collection of                            specimen(s) by brushing or washing, when performed                            (separate procedure) Diagnosis Code(s):        --- Professional ---                           K80.50, Calculus of bile duct without cholangitis                            or cholecystitis without obstruction CPT copyright 2019 American Medical Association. All rights reserved. The codes documented in this report are preliminary and upon coder review may  be revised to meet current compliance requirements. Vida Rigger, MD 10/25/2020 2:04:14 PM This report has been signed electronically. Number of Addenda: 0

## 2020-10-26 DIAGNOSIS — K8050 Calculus of bile duct without cholangitis or cholecystitis without obstruction: Secondary | ICD-10-CM | POA: Diagnosis not present

## 2020-10-26 DIAGNOSIS — K8309 Other cholangitis: Secondary | ICD-10-CM | POA: Diagnosis not present

## 2020-10-26 DIAGNOSIS — D500 Iron deficiency anemia secondary to blood loss (chronic): Secondary | ICD-10-CM | POA: Diagnosis not present

## 2020-10-26 DIAGNOSIS — F0390 Unspecified dementia without behavioral disturbance: Secondary | ICD-10-CM | POA: Diagnosis not present

## 2020-10-26 LAB — CBC
HCT: 34.2 % — ABNORMAL LOW (ref 39.0–52.0)
Hemoglobin: 10.6 g/dL — ABNORMAL LOW (ref 13.0–17.0)
MCH: 30.2 pg (ref 26.0–34.0)
MCHC: 31 g/dL (ref 30.0–36.0)
MCV: 97.4 fL (ref 80.0–100.0)
Platelets: 201 10*3/uL (ref 150–400)
RBC: 3.51 MIL/uL — ABNORMAL LOW (ref 4.22–5.81)
RDW: 15.1 % (ref 11.5–15.5)
WBC: 7.5 10*3/uL (ref 4.0–10.5)
nRBC: 0 % (ref 0.0–0.2)

## 2020-10-26 LAB — COMPREHENSIVE METABOLIC PANEL
ALT: 65 U/L — ABNORMAL HIGH (ref 0–44)
AST: 57 U/L — ABNORMAL HIGH (ref 15–41)
Albumin: 3.2 g/dL — ABNORMAL LOW (ref 3.5–5.0)
Alkaline Phosphatase: 245 U/L — ABNORMAL HIGH (ref 38–126)
Anion gap: 10 (ref 5–15)
BUN: 15 mg/dL (ref 8–23)
CO2: 26 mmol/L (ref 22–32)
Calcium: 9.5 mg/dL (ref 8.9–10.3)
Chloride: 106 mmol/L (ref 98–111)
Creatinine, Ser: 1.01 mg/dL (ref 0.61–1.24)
GFR, Estimated: >60 mL/min (ref >60)
Glucose, Bld: 140 mg/dL — ABNORMAL HIGH (ref 70–99)
Potassium: 4.6 mmol/L (ref 3.5–5.1)
Sodium: 142 mmol/L (ref 135–145)
Total Bilirubin: 1.3 mg/dL — ABNORMAL HIGH (ref 0.3–1.2)
Total Protein: 6.2 g/dL — ABNORMAL LOW (ref 6.5–8.1)

## 2020-10-26 LAB — MAGNESIUM: Magnesium: 2.2 mg/dL (ref 1.7–2.4)

## 2020-10-26 LAB — PHOSPHORUS: Phosphorus: 3.3 mg/dL (ref 2.5–4.6)

## 2020-10-26 MED ORDER — APIXABAN 5 MG PO TABS: 5.0000 mg | ORAL_TABLET | Freq: Two times a day (BID) | ORAL | 1 refills | Status: DC

## 2020-10-26 MED ORDER — SODIUM CHLORIDE 0.9 % IV SOLN
250.0000 mg | Freq: Every day | INTRAVENOUS | Status: DC
Start: 2020-10-26 — End: 2020-10-26
  Administered 2020-10-26: 250 mg via INTRAVENOUS
  Filled 2020-10-26: qty 20

## 2020-10-26 MED ORDER — SENNOSIDES-DOCUSATE SODIUM 8.6-50 MG PO TABS: 1.0000 | ORAL_TABLET | Freq: Two times a day (BID) | ORAL | 0 refills | Status: DC | PRN

## 2020-10-26 MED ORDER — FERROUS SULFATE 325 (65 FE) MG PO TBEC: 325.0000 mg | DELAYED_RELEASE_TABLET | Freq: Two times a day (BID) | ORAL | 1 refills | Status: DC

## 2020-10-26 MED ORDER — AMOXICILLIN-POT CLAVULANATE 875-125 MG PO TABS: 1.0000 | ORAL_TABLET | Freq: Two times a day (BID) | ORAL | 0 refills | Status: AC

## 2020-10-26 MED ORDER — METOPROLOL TARTRATE 12.5 MG HALF TABLET
12.5000 mg | ORAL_TABLET | Freq: Two times a day (BID) | ORAL | Status: DC
  Administered 2020-10-26: 12.5 mg via ORAL
  Filled 2020-10-26: qty 1

## 2020-10-26 NOTE — Progress Notes (Signed)
PT Cancellation Note  Patient Details Name: Cory Hansen MRN: [id number] DOB: 04-Aug-1920   Cancelled Treatment:    Reason Eval/Treat Not Completed:  (Pt discharged)   Lyman Speller., PT, DPT  Acute Rehabilitation Services  Office [phone]  10/26/2020, 2:53 PM

## 2020-10-26 NOTE — Progress Notes (Signed)
Pharmacy Antibiotic Note  Cory Hansen is a 87 y.o. male admitted on 10/20/2020 with shortness of breath and abdominal pain which has resolved.  An ultrasound and MRCP was obtained which shows CBD stone- ERCP performed 10/25/20.  WBC 7.5, afebrile  Plan: Continue Unasyn 3g IV q6 hours Follow renal function and clinical course F/u ability to switch to Augmentin   Height: 5\' 11"  (180.3 cm) Weight: 68.7 kg (151 lb 7.3 oz) IBW/kg (Calculated) : 75.3  Temp (24hrs), Avg:97.8 F (36.6 C), Min:97.8 F (36.6 C), Max:97.8 F (36.6 C)  Recent Labs  Lab 10/22/20 0535 10/23/20 0441 10/24/20 0435 10/25/20 0427 10/26/20 0523  WBC 6.3 5.4 4.4 4.9 7.5  CREATININE 0.92 0.88 0.82 0.85 1.01     Estimated Creatinine Clearance: 40.6 mL/min (by C-G formula based on SCr of 1.01 mg/dL).    No Known Allergies  Thank you for allowing pharmacy to be a part of this patient's care.  10/28/20, PharmD 10/26/2020 1:06 PM

## 2020-10-26 NOTE — Discharge Summary (Signed)
Physician Discharge Summary  Cory Hansen BMW:[id number] DOB: 08-18-1920 DOA: 10/20/2020  PCP: Hoyt Koch, MD  Admit date: 10/20/2020 Discharge date: 10/26/2020  Admitted From: Home Disposition: Home  Recommendations for Outpatient Follow-up:  Follow ups as below. Please obtain CBC/BMP/Mag at follow up Please follow up on the following pending results: None  Home Health: PT/OT Equipment/Devices: None  Discharge Condition: Stable CODE STATUS: Full code   Follow-up Information     [facility] [place] Follow up.   Specialty: [place] Why: The agency that will be providing physical therapy when you return home Contact information: [address] [place] [address] [phone]         [facility] Follow up on 11/03/2020.   Specialty: Internal Medicine Why: Friday at 9:20 for your hospital follow up appointment Contact information: [place]. [place] [phone]                 Hospital Course: 87 year old M with history of dementia, A. Fib on Eliquis, diastolic CHF, nephrolithiasis, HTN and osteoarthritis presenting with chest discomfort, shortness of breath and abdominal pain, and admitted for possible cholangitis in the setting of acute choledocholithiasis measuring about 13 mm with CBD to 9 mm on MRCP.  He was a started on IV meropenem.  GI consulted.  Underwent ERCP with removal of choledocholithiasis by biliary sphincterectomy and balloon extraction.  GI recommended holding anticoagulation for 24 hours, preferably 48 hours after procedure. Per surgery, no plan for lap chole as there is no evidence of cholecystitis.  Patient remained stable post ERCP.  LFTs stable as well.  He tolerated soft diet.  Cleared for discharge by GI Discharged on p.o. Augmentin for 4 more days.  GI to arrange outpatient follow-up.  Patient to follow-up with PCP as well.   See  individual problem list below for more on hospital course.   Discharge Diagnoses:  Severe sepsis due to acute cholangitis in the setting of choledocholithiasis-POA.  Patient met criteria on presentation with leukocytosis, tachycardia, tachypnea and hypotension.  Sepsis physiology resolved.  LFT improving.  Tolerated soft diet. -7/20-ERCP with removal of choledocholithiasis by biliary sphincterectomy and balloon extraction.   -IV meropenem 7/16-7/18>> IV Unasyn 7/18-7/21>> Augmentin for 4 more days -Advised to hold Eliquis until 10/27/2020   Elevated LFTs/hyperbilirubinemia: Due to #1.  Improved. Recent Labs  Lab 10/22/20 0535 10/23/20 0441 10/24/20 0435 10/25/20 0427 10/26/20 0523  AST 83* 84* 50* 47* 57*  ALT 97* 91* 68* 65* 65*  ALKPHOS 290* 298* 272* 290* 245*  BILITOT 4.1* 2.3* 1.6* 1.6* 1.3*  PROT 5.9* 5.9* 5.6* 6.1* 6.2*  ALBUMIN 3.0* 2.9* 2.9* 3.2* 3.2*  -Recheck CMP at follow-up   Chronic diastolic CHF: Limited TTE in 06/2020 with LVEF of 55 to 60%, RVSP of 44.7.  Appears compensated.  On Lasix at home. -Continue home medications   Essential hypertension: Normotensive. -Continue home medications   Paroxysmal A. fib: Rate controlled.  On metoprolol and Eliquis at home. -Continue home metoprolol -Patient to resume Eliquis on 10/27/2020   Iron deficiency anemia: H&H stable after initial drop likely from IV fluid.  Anemia panel confirms iron deficiency Recent Labs    06/20/20 0803 06/28/20 0432 06/30/20 2106 10/20/20 1651 10/21/20 0404 10/22/20 0535 10/23/20 0441 10/24/20 0435 10/25/20 0427 10/26/20 0523  HGB 12.3* 14.7 14.3 12.1* 10.4* 9.8* 10.5* 10.2* 10.6* 10.6*  -Received IV ferric gluconate 240 mg x 2 -Discharged on p.o.  ferrous sulfate starting next week -Advised to take bowel regimens -Recheck CBC at follow-up   Dementia without behavioral disturbance: Seems to be fairly oriented -Reorientation and delirium precautions   History of bladder cancer/BPH  with LUTS -Continue home Flomax   GERD:  -Continue Protonix   Inadequate oral intake: Resolved. Body mass index is 21.12 kg/m. Nutrition Problem: Inadequate oral intake Etiology: acute illness Signs/Symptoms: per patient/family report        Discharge Exam: Vitals:   10/25/20 2056 10/26/20 0438  BP: 130/73 115/74  Pulse: 72 76  Resp: 18 18  Temp: 97.8 F (36.6 C) 97.8 F (36.6 C)  SpO2: 95% 96%    GENERAL: No apparent distress.  Nontoxic. HEENT: MMM.  Vision and hearing grossly intact.  NECK: Supple.  No apparent JVD.  RESP: On RA.  No IWOB.  Fair aeration bilaterally. CVS:  RRR. Heart sounds normal.  ABD/GI/GU: Bowel sounds present. Soft. Non tender.  MSK/EXT:  Moves extremities. No apparent deformity. No edema.  SKIN: no apparent skin lesion or wound NEURO: Awake, alert and oriented appropriately.  No apparent focal neuro deficit. PSYCH: Calm. Normal affect.   Discharge Instructions  Discharge Instructions     Call MD for:  difficulty breathing, headache or visual disturbances   Complete by: As directed    Call MD for:  extreme fatigue   Complete by: As directed    Call MD for:  persistant dizziness or light-headedness   Complete by: As directed    Call MD for:  persistant nausea and vomiting   Complete by: As directed    Call MD for:  severe uncontrolled pain   Complete by: As directed    Call MD for:  temperature >100.4   Complete by: As directed    Diet general   Complete by: As directed    Soft nongreasy diet for the next 3 to 4 days.   Discharge instructions   Complete by: As directed    It has been a pleasure taking care of you!  You were hospitalized biliary obstruction and infection due to biliary stone that has been removed endoscopically.  We have started you on antibiotic to treat infection.  We are discharging you on more antibiotics to complete treatment course.  We recommend holding your Eliquis (blood thinner) until tomorrow (10/27/2020).   We also recommend continuing soft nongreasy diet for the next 3 to 4 days.    We have started you on iron tablets.  You may start taking this next week.  Iron can cause constipation and dark stool.  You may use a stool softener as needed for constipation.  Follow-up with your primary care doctor in 1 to 2 weeks to have your liver enzymes and blood levels rechecked  Please review your new medication list and the directions on your medications before you take them.   Take care,   Increase activity slowly   Complete by: As directed       Allergies as of 10/26/2020   No Known Allergies      Medication List     STOP taking these medications    docusate sodium 100 MG capsule Commonly known as: COLACE       TAKE these medications    acetaminophen 500 MG tablet Commonly known as: TYLENOL Take 500 mg by mouth 2 (two) times daily.   amoxicillin-clavulanate 875-125 MG tablet Commonly known as: Augmentin Take 1 tablet by mouth 2 (two) times daily for 4 days.  apixaban 5 MG Tabs tablet Commonly known as: ELIQUIS Take 1 tablet (5 mg total) by mouth 2 (two) times daily. Start taking on: October 27, 2020   ferrous sulfate 325 (65 FE) MG EC tablet Take 1 tablet (325 mg total) by mouth 2 (two) times daily. Start taking on: November 02, 2020   fluticasone 50 MCG/ACT nasal spray Commonly known as: FLONASE Place 1 spray into both nostrils daily as needed for allergies.   furosemide 20 MG tablet Commonly known as: LASIX Take 1 tablet (20 mg total) by mouth daily.   hydroxypropyl methylcellulose / hypromellose 2.5 % ophthalmic solution Commonly known as: ISOPTO TEARS / [place] Place into both eyes daily as needed for dry eyes.   magnesium citrate Soln Take 1 Bottle by mouth daily as needed for severe constipation or moderate constipation.   metoprolol tartrate 25 MG tablet Commonly known as: LOPRESSOR Take 0.5 tablets (12.5 mg total) by mouth 2 (two) times daily.    senna-docusate 8.6-50 MG tablet Commonly known as: Senokot-S Take 1 tablet by mouth 2 (two) times daily between meals as needed for mild constipation. Start taking on: October 31, 2020   tamsulosin 0.4 MG Caps capsule Commonly known as: FLOMAX Take 0.4 mg by mouth at bedtime.        Consultations: Gastroenterology  Procedures/Studies: 7/20-ERCP with removal of choledocholithiasis by biliary sphincterectomy and balloon extraction   MR 3D Recon At Scanner  Result Date: 10/23/2020 CLINICAL DATA:  Dilated common duct, evaluate for choledocholithiasis EXAM: MRI ABDOMEN WITHOUT AND WITH CONTRAST (INCLUDING MRCP) TECHNIQUE: Multiplanar multisequence MR imaging of the abdomen was performed both before and after the administration of intravenous contrast. Heavily T2-weighted images of the biliary and pancreatic ducts were obtained, and three-dimensional MRCP images were rendered by post processing. CONTRAST:  61m GADAVIST GADOBUTROL 1 MMOL/ML IV SOLN COMPARISON:  Right upper quadrant ultrasound dated 10/20/2020 FINDINGS: Motion degraded images. Lower chest: Lung bases are clear. Hepatobiliary: Liver is within normal limits. No suspicious/enhancing hepatic lesions. Gallbladder is mildly distended but grossly unremarkable. No cholelithiasis, gallbladder wall thickening, pericholecystic fluid, or inflammatory changes. No intrahepatic ductal dilatation. Common duct measures 9 mm, within the upper limits of normal. However, there is a 13 mm distal CBD stone (series 5/image 19). Pancreas:  Within normal limits. Spleen:  Within normal limits. Adrenals/Urinary Tract:  Adrenal glands are within normal limits. Multiple bilateral renal cysts, measuring up to 1.7 cm in the medial left upper kidney (series 14/image 22), benign (Bosniak I). Left renal sinus cysts. No hydronephrosis. Stomach/Bowel: Stomach is within normal limits. Visualized bowel is unremarkable. Vascular/Lymphatic:  No evidence of abdominal aortic  aneurysm. No suspicious abdominal lymphadenopathy. Other:  No abdominal ascites. Musculoskeletal: Degenerative changes of the visualized thoracolumbar spine. IMPRESSION: Motion degraded images. Choledocholithiasis with a 13 mm distal CBD stone. Common duct measures 9 mm, within the upper limits of normal. No intrahepatic ductal dilatation. No cholelithiasis or associated inflammatory changes. Electronically Signed   By: SJulian HyM.D.   On: 10/23/2020 19:02   DG Chest Portable 1 View  Result Date: 10/20/2020 CLINICAL DATA:  Chest pain EXAM: PORTABLE CHEST 1 VIEW COMPARISON:  06/30/2020, 08/28/2009 FINDINGS: Small right-sided pleural effusion. Bilateral calcified pleural plaques. Stable cardiomediastinal silhouette with aortic atherosclerosis. No pneumothorax IMPRESSION: 1. Small right-sided pleural effusion. 2. Calcified pleural plaques bilaterally Electronically Signed   By: KDonavan FoilM.D.   On: 10/20/2020 17:27   DG ERCP  Result Date: 10/25/2020 CLINICAL DATA:  Choledocholithiasis EXAM: ERCP TECHNIQUE: Multiple spot  images obtained with the fluoroscopic device and submitted for interpretation post-procedure. FLUOROSCOPY TIME:  Fluoroscopy Time:  3 minutes 10 seconds Radiation Exposure Index (if provided by the fluoroscopic device): 40.5 mGy Number of Acquired Spot Images: 0 COMPARISON:  MRCP 10/20/2020 FINDINGS: Twenty-five intraoperative saved images are submitted for review. The images demonstrate a flexible duodenal scope in the descending duodenum with wire cannulation of the common bile duct followed by sphincterotomy, cholangiography and balloon sweeping of the common bile duct. A filling defect is present in the distal common bile duct at the beginning of the study but is no longer evident on the final images. IMPRESSION: 1. Choledocholithiasis. 2. ERCP with sphincterotomy and balloon sweeping of the common duct. These images were submitted for radiologic interpretation only. Please see  the procedural report for the amount of contrast and the fluoroscopy time utilized. Electronically Signed   By: Jacqulynn Cadet M.D.   On: 10/25/2020 15:03   MR ABDOMEN MRCP W WO CONTAST  Result Date: 10/20/2020 CLINICAL DATA:  Dilated common duct, evaluate for choledocholithiasis EXAM: MRI ABDOMEN WITHOUT AND WITH CONTRAST (INCLUDING MRCP) TECHNIQUE: Multiplanar multisequence MR imaging of the abdomen was performed both before and after the administration of intravenous contrast. Heavily T2-weighted images of the biliary and pancreatic ducts were obtained, and three-dimensional MRCP images were rendered by post processing. CONTRAST:  32m GADAVIST GADOBUTROL 1 MMOL/ML IV SOLN COMPARISON:  Right upper quadrant ultrasound dated 10/20/2020 FINDINGS: Motion degraded images. Lower chest: Lung bases are clear. Hepatobiliary: Liver is within normal limits. No suspicious/enhancing hepatic lesions. Gallbladder is mildly distended but grossly unremarkable. No cholelithiasis, gallbladder wall thickening, pericholecystic fluid, or inflammatory changes. No intrahepatic ductal dilatation. Common duct measures 9 mm, within the upper limits of normal. However, there is a 13 mm distal CBD stone (series 5/image 19). Pancreas:  Within normal limits. Spleen:  Within normal limits. Adrenals/Urinary Tract:  Adrenal glands are within normal limits. Multiple bilateral renal cysts, measuring up to 1.7 cm in the medial left upper kidney (series 14/image 22), benign (Bosniak I). Left renal sinus cysts. No hydronephrosis. Stomach/Bowel: Stomach is within normal limits. Visualized bowel is unremarkable. Vascular/Lymphatic:  No evidence of abdominal aortic aneurysm. No suspicious abdominal lymphadenopathy. Other:  No abdominal ascites. Musculoskeletal: Degenerative changes of the visualized thoracolumbar spine. IMPRESSION: Motion degraded images. Choledocholithiasis with a 13 mm distal CBD stone. Common duct measures 9 mm, within the upper  limits of normal. No intrahepatic ductal dilatation. No cholelithiasis or associated inflammatory changes. Electronically Signed   By: SJulian HyM.D.   On: 10/20/2020 23:10   UKoreaAbdomen Limited RUQ (LIVER/GB)  Result Date: 10/20/2020 CLINICAL DATA:  Upper abdominal pain EXAM: ULTRASOUND ABDOMEN LIMITED RIGHT UPPER QUADRANT COMPARISON:  03/14/2020 FINDINGS: Gallbladder: No gallstones or wall thickening visualized. No sonographic Murphy sign noted by sonographer. Common bile duct: Diameter: 9 mm in proximal diameter, progressively dilated since prior examination. Liver: There is limited evaluation of the liver with obscuration of muscle the hepatic parenchyma due to overlying bowel gas, aerated lung, and osseous structures. The visualized hepatic parenchyma demonstrates normal echogenicity and echotexture. No intrahepatic mass is identified. Portal vein is patent on color Doppler imaging with normal direction of blood flow towards the liver. Other: No ascites IMPRESSION: Progressive dilation of the proximal extrahepatic bile duct. A distal obstructing lesion, such as choledocholithiasis, is not excluded. Correlation with liver enzymes is recommended. If indicated, ERCP or MRCP examination may be more helpful for evaluation of the distal duct. Electronically Signed   By:  Fidela Salisbury MD   On: 10/20/2020 19:05       The results of significant diagnostics from this hospitalization (including imaging, microbiology, ancillary and laboratory) are listed below for reference.     Microbiology: Recent Results (from the past 240 hour(s))  Resp Panel by RT-PCR (Flu A&B, Covid) Nasopharyngeal Swab     Status: None   Collection Time: 10/20/20  4:49 PM   Specimen: Nasopharyngeal Swab; Nasopharyngeal(NP) swabs in vial transport medium  Result Value Ref Range Status   SARS Coronavirus 2 by RT PCR NEGATIVE NEGATIVE Final    Comment: (NOTE) SARS-CoV-2 target nucleic acids are NOT DETECTED.  The  SARS-CoV-2 RNA is generally detectable in upper respiratory specimens during the acute phase of infection. The lowest concentration of SARS-CoV-2 viral copies this assay can detect is 138 copies/mL. A negative result does not preclude SARS-Cov-2 infection and should not be used as the sole basis for treatment or other patient management decisions. A negative result may occur with  improper specimen collection/handling, submission of specimen other than nasopharyngeal swab, presence of viral mutation(s) within the areas targeted by this assay, and inadequate number of viral copies(<138 copies/mL). A negative result must be combined with clinical observations, patient history, and epidemiological information. The expected result is Negative.  Fact Sheet for Patients:  EntrepreneurPulse.com.au  Fact Sheet for Healthcare Providers:  IncredibleEmployment.be  This test is no t yet approved or cleared by the [place] FDA and  has been authorized for detection and/or diagnosis of SARS-CoV-2 by FDA under an Emergency Use Authorization (EUA). This EUA will remain  in effect (meaning this test can be used) for the duration of the COVID-19 declaration under Section 564(b)(1) of the Act, 21 U.S.C.section 360bbb-3(b)(1), unless the authorization is terminated  or revoked sooner.       Influenza A by PCR NEGATIVE NEGATIVE Final   Influenza B by PCR NEGATIVE NEGATIVE Final    Comment: (NOTE) The Xpert Xpress SARS-CoV-2/FLU/RSV plus assay is intended as an aid in the diagnosis of influenza from Nasopharyngeal swab specimens and should not be used as a sole basis for treatment. Nasal washings and aspirates are unacceptable for Xpert Xpress SARS-CoV-2/FLU/RSV testing.  Fact Sheet for Patients: EntrepreneurPulse.com.au  Fact Sheet for Healthcare Providers: IncredibleEmployment.be  This test is not yet approved or  cleared by the [place] FDA and has been authorized for detection and/or diagnosis of SARS-CoV-2 by FDA under an Emergency Use Authorization (EUA). This EUA will remain in effect (meaning this test can be used) for the duration of the COVID-19 declaration under Section 564(b)(1) of the Act, 21 U.S.C. section 360bbb-3(b)(1), unless the authorization is terminated or revoked.  Performed at [facility], [place] [address], [place], [place] [address]      Labs:  CBC: Recent Labs  Lab 10/20/20 1651 10/21/20 0404 10/22/20 0535 10/23/20 0441 10/24/20 0435 10/25/20 0427 10/26/20 0523  WBC 11.4*   < > 6.3 5.4 4.4 4.9 7.5  NEUTROABS 9.8*  --  3.9  --   --   --   --   HGB 12.1*   < > 9.8* 10.5* 10.2* 10.6* 10.6*  HCT 39.5   < > 31.7* 32.8* 34.3* 34.4* 34.2*  MCV 98.5   < > 97.2 94.8 102.1* 97.7 97.4  PLT 271   < > 203 151 190 196 201   < > = values in this interval not displayed.   BMP &GFR Recent Labs  Lab 10/22/20 0535 10/23/20 0441  10/23/20 1840 10/24/20 0435 10/25/20 0427 10/26/20 0523  NA 138 139  --  142 144 142  K 4.4 4.3  --  4.5 4.7 4.6  CL 109 109  --  110 111 106  CO2 24 20*  --  '22 27 26  ' GLUCOSE 92 95  --  98 107* 140*  BUN 16 12  --  '12 13 15  ' CREATININE 0.92 0.88  --  0.82 0.85 1.01  CALCIUM 8.7* 9.0  --  8.9 9.3 9.5  MG 2.2 2.0 2.0  --  2.3 2.2  PHOS 3.5 2.8 2.9  --  3.4 3.3   Estimated Creatinine Clearance: 40.6 mL/min (by C-G formula based on SCr of 1.01 mg/dL). Liver & Pancreas: Recent Labs  Lab 10/22/20 0535 10/23/20 0441 10/24/20 0435 10/25/20 0427 10/26/20 0523  AST 83* 84* 50* 47* 57*  ALT 97* 91* 68* 65* 65*  ALKPHOS 290* 298* 272* 290* 245*  BILITOT 4.1* 2.3* 1.6* 1.6* 1.3*  PROT 5.9* 5.9* 5.6* 6.1* 6.2*  ALBUMIN 3.0* 2.9* 2.9* 3.2* 3.2*   Recent Labs  Lab 10/20/20 1651 10/23/20 0441  LIPASE 47 30   No results for input(s): AMMONIA in the last 168 hours. Diabetic: No results for input(s): HGBA1C in the  last 72 hours. No results for input(s): GLUCAP in the last 168 hours. Cardiac Enzymes: No results for input(s): CKTOTAL, CKMB, CKMBINDEX, TROPONINI in the last 168 hours. No results for input(s): PROBNP in the last 8760 hours. Coagulation Profile: No results for input(s): INR, PROTIME in the last 168 hours. Thyroid Function Tests: No results for input(s): TSH, T4TOTAL, FREET4, T3FREE, THYROIDAB in the last 72 hours. Lipid Profile: No results for input(s): CHOL, HDL, LDLCALC, TRIG, CHOLHDL, LDLDIRECT in the last 72 hours. Anemia Panel: Recent Labs    10/25/20 0427  VITAMINB12 475  FOLATE 13.6  FERRITIN 36  TIBC 361  IRON 27*  RETICCTPCT 1.0   Urine analysis:    Component Value Date/Time   COLORURINE YELLOW 06/30/2020 0305   APPEARANCEUR CLEAR 06/30/2020 0305   LABSPEC 1.019 06/30/2020 0305   PHURINE 6.0 06/30/2020 0305   GLUCOSEU NEGATIVE 06/30/2020 0305   GLUCOSEU NEGATIVE 10/31/2017 1407   HGBUR NEGATIVE 06/30/2020 0305   BILIRUBINUR NEGATIVE 06/30/2020 0305   KETONESUR NEGATIVE 06/30/2020 0305   PROTEINUR NEGATIVE 06/30/2020 0305   UROBILINOGEN 0.2 10/31/2017 1407   NITRITE NEGATIVE 06/30/2020 0305   LEUKOCYTESUR NEGATIVE 06/30/2020 0305   Sepsis Labs: Invalid input(s): PROCALCITONIN, LACTICIDVEN   Time coordinating discharge: 40 minutes  SIGNED:  Mercy Riding, MD  [facility] 10/26/2020, 5:03 PM  If 7PM-7AM, please contact night-coverage [email or web address]

## 2020-10-26 NOTE — Progress Notes (Signed)
[facility] Progress Note  Cory Hansen 87 y.o. 09-03-1920  CC:  Choledocholithasis   Subjective: Patient denies abdominal pain, nausea, vomiting. Feels well and is looking forward to discharge.  ROS : Review of Systems  Cardiovascular:  Negative for chest pain and palpitations.  Gastrointestinal:  Negative for abdominal pain, blood in stool, constipation, diarrhea, heartburn, melena, nausea and vomiting.   Objective: Vital signs in last 24 hours: Vitals:   10/25/20 2056 10/26/20 0438  BP: 130/73 115/74  Pulse: 72 76  Resp: 18 18  Temp: 97.8 F (36.6 C) 97.8 F (36.6 C)  SpO2: 95% 96%    Physical Exam:  General:  Alert, cooperative, no distress; daughter at bedside  Head:  Normocephalic, without obvious abnormality, atraumatic  Eyes:  Anicteric sclera, EOMs intact  Lungs:   Respirations unlabored  Abdomen:   Soft, non-tender, bowel sounds active all four quadrants  Extremities: Extremities normal, atraumatic, no  edema    Lab Results: Recent Labs    10/25/20 0427 10/26/20 0523  NA 144 142  K 4.7 4.6  CL 111 106  CO2 27 26  GLUCOSE 107* 140*  BUN 13 15  CREATININE 0.85 1.01  CALCIUM 9.3 9.5  MG 2.3 2.2  PHOS 3.4 3.3    Recent Labs    10/25/20 0427 10/26/20 0523  AST 47* 57*  ALT 65* 65*  ALKPHOS 290* 245*  BILITOT 1.6* 1.3*  PROT 6.1* 6.2*  ALBUMIN 3.2* 3.2*    Recent Labs    10/25/20 0427 10/26/20 0523  WBC 4.9 7.5  HGB 10.6* 10.6*  HCT 34.4* 34.2*  MCV 97.7 97.4  PLT 196 201    No results for input(s): LABPROT, INR in the last 72 hours.   Assessment: Choledocholithasis s/p ERCP 10/25/20 with sphincterotomy and stone extraction:  -LFTs continue to improve: T. Bili 1.3/ AST 57/ ALT 65/ ALP 245 -Leukocytosis has resolved  Plan: OK to discharge from GI standpoint.  Repeat LFTs in 2-4 weeks.  Discussion at bedside with patient and patient's daughter. Cannot guarantee that gallstones are not present in gallbladder and  thus there is possibility of recurrence of choledocholithiasis, though hopefully biliary sphincterotomy will help aid in passage of stones if present. Advised patient and patient's daughter to present to the ED if any signs of jaundice, fever, abdominal pain. They verbalized understanding.  [facility] will sign off. Please contact us if we can be of any further assistance during this hospital stay.  Edrick Kins PA-C 10/26/2020, 10:08 AM  Contact #  [phone]

## 2020-10-26 NOTE — Care Management Important Message (Signed)
Important Message  Patient Details IM Letter placed in Patient's room. Name: Cory Hansen MRN: [id number] Date of Birth: 12-18-1920   Medicare Important Message Given:  Yes     Caren Macadam 10/26/2020, 10:56 AM

## 2020-10-28 DIAGNOSIS — I110 Hypertensive heart disease with heart failure: Secondary | ICD-10-CM | POA: Diagnosis not present

## 2020-10-28 DIAGNOSIS — Z48815 Encounter for surgical aftercare following surgery on the digestive system: Secondary | ICD-10-CM | POA: Diagnosis not present

## 2020-10-28 DIAGNOSIS — I480 Paroxysmal atrial fibrillation: Secondary | ICD-10-CM | POA: Diagnosis not present

## 2020-10-28 DIAGNOSIS — I5032 Chronic diastolic (congestive) heart failure: Secondary | ICD-10-CM | POA: Diagnosis not present

## 2020-10-31 ENCOUNTER — Telehealth: Payer: Self-pay | Admitting: Internal Medicine

## 2020-10-31 DIAGNOSIS — I110 Hypertensive heart disease with heart failure: Secondary | ICD-10-CM | POA: Diagnosis not present

## 2020-10-31 DIAGNOSIS — I5032 Chronic diastolic (congestive) heart failure: Secondary | ICD-10-CM | POA: Diagnosis not present

## 2020-10-31 DIAGNOSIS — Z48815 Encounter for surgical aftercare following surgery on the digestive system: Secondary | ICD-10-CM | POA: Diagnosis not present

## 2020-10-31 DIAGNOSIS — I480 Paroxysmal atrial fibrillation: Secondary | ICD-10-CM | POA: Diagnosis not present

## 2020-10-31 NOTE — Telephone Encounter (Signed)
See below

## 2020-10-31 NOTE — Telephone Encounter (Signed)
Eileen Stanford with [facility] [phone]) requesting verbal order for Physical Therapy plan of care to see patient twice weekly for 4 weeks  To work on improving strength, balance, transfer, & gate training.  Would also like order for nurse to go out & evaluate patient

## 2020-10-31 NOTE — Telephone Encounter (Signed)
Can approve; [id number] visit scheduled for 11/06/20 so patient should keep this for ongoing orders.

## 2020-11-01 ENCOUNTER — Encounter: Payer: Self-pay | Admitting: Internal Medicine

## 2020-11-02 NOTE — Telephone Encounter (Signed)
Called [place]. LVM asking her to give our office a call. Office number was provided.

## 2020-11-03 ENCOUNTER — Telehealth: Payer: Self-pay

## 2020-11-03 ENCOUNTER — Encounter: Payer: Self-pay | Admitting: Internal Medicine

## 2020-11-03 ENCOUNTER — Inpatient Hospital Stay: Payer: Medicare Other | Admitting: Internal Medicine

## 2020-11-03 DIAGNOSIS — Z48815 Encounter for surgical aftercare following surgery on the digestive system: Secondary | ICD-10-CM | POA: Diagnosis not present

## 2020-11-03 DIAGNOSIS — I110 Hypertensive heart disease with heart failure: Secondary | ICD-10-CM | POA: Diagnosis not present

## 2020-11-03 DIAGNOSIS — I480 Paroxysmal atrial fibrillation: Secondary | ICD-10-CM | POA: Diagnosis not present

## 2020-11-03 DIAGNOSIS — I5032 Chronic diastolic (congestive) heart failure: Secondary | ICD-10-CM | POA: Diagnosis not present

## 2020-11-03 NOTE — Telephone Encounter (Signed)
Should have follow up to discuss lasix dosing. Can use senokot 1-2 times a day as needed for BM.

## 2020-11-03 NOTE — Telephone Encounter (Signed)
See below

## 2020-11-03 NOTE — Telephone Encounter (Signed)
Rosanne Ashing with [facility] states pts BP is 90/60 and pt is currently asymptomatic. Rosanne Ashing states that information for Lasix is as follows per the hospital directions are take daily as family has stated it was to be used PRN in the past if pt is retaining fluid. Please advise on how pt should be taking Lasix.   Pt states he has been coughing up clear or yellow sputum x3 days w/o any other sxs and 4 days no bowel movement. Pt states he did have a bm today. Senekot to be taken 1 pill 2x daily PRN per hospital dc instructions. However, pts family is doing 1x a day. Please advise on how pt should be taking the Senekot.  Rosanne Ashing [phone].

## 2020-11-03 NOTE — Telephone Encounter (Signed)
Called Rosanne Ashing. LVM asking him to give our office a call back. Office number was provided.

## 2020-11-06 ENCOUNTER — Telehealth: Payer: Self-pay | Admitting: Internal Medicine

## 2020-11-06 ENCOUNTER — Ambulatory Visit (INDEPENDENT_AMBULATORY_CARE_PROVIDER_SITE_OTHER): Payer: Medicare Other | Admitting: Internal Medicine

## 2020-11-06 ENCOUNTER — Other Ambulatory Visit: Payer: Self-pay

## 2020-11-06 ENCOUNTER — Encounter: Payer: Self-pay | Admitting: Internal Medicine

## 2020-11-06 VITALS — BP 120/68 | HR 66 | Temp 98.2°F | Resp 18 | Ht 71.0 in

## 2020-11-06 DIAGNOSIS — I5032 Chronic diastolic (congestive) heart failure: Secondary | ICD-10-CM | POA: Diagnosis not present

## 2020-11-06 DIAGNOSIS — K8050 Calculus of bile duct without cholangitis or cholecystitis without obstruction: Secondary | ICD-10-CM | POA: Diagnosis not present

## 2020-11-06 LAB — COMPREHENSIVE METABOLIC PANEL
ALT: 22 U/L (ref 0–53)
AST: 24 U/L (ref 0–37)
Albumin: 4.1 g/dL (ref 3.5–5.2)
Alkaline Phosphatase: 200 U/L — ABNORMAL HIGH (ref 39–117)
BUN: 20 mg/dL (ref 6–23)
CO2: 24 mEq/L (ref 19–32)
Calcium: 9.6 mg/dL (ref 8.4–10.5)
Chloride: 106 mEq/L (ref 96–112)
Creatinine, Ser: 1.2 mg/dL (ref 0.40–1.50)
GFR: 50.62 mL/min — ABNORMAL LOW (ref >60.00)
Glucose, Bld: 92 mg/dL (ref 70–99)
Potassium: 4.9 mEq/L (ref 3.5–5.1)
Sodium: 138 mEq/L (ref 135–145)
Total Bilirubin: 1.1 mg/dL (ref 0.2–1.2)
Total Protein: 7.2 g/dL (ref 6.0–8.3)

## 2020-11-06 LAB — CBC
HCT: 36.1 % — ABNORMAL LOW (ref 39.0–52.0)
Hemoglobin: 11.5 g/dL — ABNORMAL LOW (ref 13.0–17.0)
MCHC: 31.8 g/dL (ref 30.0–36.0)
MCV: 94.6 fl (ref 78.0–100.0)
Platelets: 189 10*3/uL (ref 150.0–400.0)
RBC: 3.81 Mil/uL — ABNORMAL LOW (ref 4.22–5.81)
RDW: 15.9 % — ABNORMAL HIGH (ref 11.5–15.5)
WBC: 7.7 10*3/uL (ref 4.0–10.5)

## 2020-11-06 NOTE — Telephone Encounter (Signed)
Daughter [name] called in regarding meds listed on MyChart..  Would like to discuss meds & dosage as soon as possible to make sure patient is taking them as he should  Req Callback before appt this afternoon if possible [phone]

## 2020-11-06 NOTE — Patient Instructions (Addendum)
The miralax is okay to take or the prune juice.   It is okay to take the fluid pill lasix (furosemide) when needed for fluid.  We are checking the labs today.

## 2020-11-06 NOTE — Progress Notes (Signed)
   Subjective:   Patient ID: Cory Hansen, male    DOB: [birth year], 87 y.o.   MRN: [id number]  HPI The patient is a 87 YO man coming in for hospital follow up. Admitted with gallbladder infection and had stone blocking duct. These were removed by ERCP. He is now feeling better. Eating and drinking well. Still some weakness, working with PT at home. Denies change to medications and wants to know about lasix when to take. Having mild constipation and wants to know if it is safe to take miralax. Denies fevers or chills. Denies abdominal pain or N/V.   Review of Systems  Constitutional:  Positive for activity change. Negative for appetite change and unexpected weight change.  HENT: Negative.    Eyes: Negative.   Respiratory:  Negative for cough, chest tightness and shortness of breath.   Cardiovascular:  Negative for chest pain, palpitations and leg swelling.  Gastrointestinal:  Negative for abdominal distention, abdominal pain, constipation, diarrhea, nausea and vomiting.  Musculoskeletal: Negative.   Skin: Negative.   Neurological:  Positive for weakness.  Psychiatric/Behavioral: Negative.     Objective:  Physical Exam Constitutional:      Appearance: He is well-developed.  HENT:     Head: Normocephalic and atraumatic.     Comments: Hard of hearing Cardiovascular:     Rate and Rhythm: Normal rate and regular rhythm.  Pulmonary:     Effort: Pulmonary effort is normal. No respiratory distress.     Breath sounds: Normal breath sounds. No wheezing or rales.  Abdominal:     General: Bowel sounds are normal. There is no distension.     Palpations: Abdomen is soft.     Tenderness: There is no abdominal tenderness. There is no rebound.  Musculoskeletal:     Cervical back: Normal range of motion.  Skin:    General: Skin is warm and dry.  Neurological:     Mental Status: He is alert and oriented to person, place, and time.     Coordination: Coordination abnormal.     Comments:  Wheelchair for long distances    Vitals:   11/06/20 1449  BP: 120/68  Pulse: 66  Resp: 18  Temp: 98.2 F (36.8 C)  TempSrc: Oral  SpO2: 98%  Height: 5\' 11"  (1.803 m)    This visit occurred during the SARS-CoV-2 public health emergency.  Safety protocols were in place, including screening questions prior to the visit, additional usage of staff PPE, and extensive cleaning of exam room while observing appropriate contact time as indicated for disinfecting solutions.   Assessment & Plan:

## 2020-11-06 NOTE — Telephone Encounter (Signed)
Patient is being seen in office today

## 2020-11-07 ENCOUNTER — Telehealth: Payer: Self-pay | Admitting: Internal Medicine

## 2020-11-07 DIAGNOSIS — Z48815 Encounter for surgical aftercare following surgery on the digestive system: Secondary | ICD-10-CM | POA: Diagnosis not present

## 2020-11-07 DIAGNOSIS — I5032 Chronic diastolic (congestive) heart failure: Secondary | ICD-10-CM | POA: Diagnosis not present

## 2020-11-07 DIAGNOSIS — I480 Paroxysmal atrial fibrillation: Secondary | ICD-10-CM | POA: Diagnosis not present

## 2020-11-07 DIAGNOSIS — I110 Hypertensive heart disease with heart failure: Secondary | ICD-10-CM | POA: Diagnosis not present

## 2020-11-07 NOTE — Telephone Encounter (Signed)
Threasa Alpha from [facility] is requesting dr notes as well as med changes for patient from most recent visit  Phone #: [phone] Fax # [phone]

## 2020-11-08 NOTE — Telephone Encounter (Signed)
Office note has been faxed. Confirmation has been received.

## 2020-11-08 NOTE — Assessment & Plan Note (Signed)
No flare today and will use lasix prn as he has in the past.

## 2020-11-08 NOTE — Assessment & Plan Note (Addendum)
Doing well s/p ERCP and no signs or symptoms of pancreatitis. Advised to advance diet as desired. Checking CBC and CMP and adjust as needed. LFTs were downtrending when he left hospital.

## 2020-11-09 DIAGNOSIS — Z48815 Encounter for surgical aftercare following surgery on the digestive system: Secondary | ICD-10-CM | POA: Diagnosis not present

## 2020-11-09 DIAGNOSIS — I480 Paroxysmal atrial fibrillation: Secondary | ICD-10-CM | POA: Diagnosis not present

## 2020-11-09 DIAGNOSIS — I5032 Chronic diastolic (congestive) heart failure: Secondary | ICD-10-CM | POA: Diagnosis not present

## 2020-11-09 DIAGNOSIS — I110 Hypertensive heart disease with heart failure: Secondary | ICD-10-CM | POA: Diagnosis not present

## 2020-11-10 DIAGNOSIS — I110 Hypertensive heart disease with heart failure: Secondary | ICD-10-CM | POA: Diagnosis not present

## 2020-11-10 DIAGNOSIS — I5032 Chronic diastolic (congestive) heart failure: Secondary | ICD-10-CM | POA: Diagnosis not present

## 2020-11-10 DIAGNOSIS — Z48815 Encounter for surgical aftercare following surgery on the digestive system: Secondary | ICD-10-CM | POA: Diagnosis not present

## 2020-11-10 DIAGNOSIS — I480 Paroxysmal atrial fibrillation: Secondary | ICD-10-CM | POA: Diagnosis not present

## 2020-11-14 DIAGNOSIS — Z48815 Encounter for surgical aftercare following surgery on the digestive system: Secondary | ICD-10-CM | POA: Diagnosis not present

## 2020-11-14 DIAGNOSIS — I110 Hypertensive heart disease with heart failure: Secondary | ICD-10-CM | POA: Diagnosis not present

## 2020-11-14 DIAGNOSIS — I480 Paroxysmal atrial fibrillation: Secondary | ICD-10-CM | POA: Diagnosis not present

## 2020-11-14 DIAGNOSIS — I5032 Chronic diastolic (congestive) heart failure: Secondary | ICD-10-CM | POA: Diagnosis not present

## 2020-11-16 DIAGNOSIS — I5032 Chronic diastolic (congestive) heart failure: Secondary | ICD-10-CM | POA: Diagnosis not present

## 2020-11-16 DIAGNOSIS — I480 Paroxysmal atrial fibrillation: Secondary | ICD-10-CM | POA: Diagnosis not present

## 2020-11-16 DIAGNOSIS — I110 Hypertensive heart disease with heart failure: Secondary | ICD-10-CM | POA: Diagnosis not present

## 2020-11-16 DIAGNOSIS — Z48815 Encounter for surgical aftercare following surgery on the digestive system: Secondary | ICD-10-CM | POA: Diagnosis not present

## 2020-11-17 DIAGNOSIS — Z48815 Encounter for surgical aftercare following surgery on the digestive system: Secondary | ICD-10-CM | POA: Diagnosis not present

## 2020-11-17 DIAGNOSIS — I480 Paroxysmal atrial fibrillation: Secondary | ICD-10-CM | POA: Diagnosis not present

## 2020-11-17 DIAGNOSIS — I5032 Chronic diastolic (congestive) heart failure: Secondary | ICD-10-CM | POA: Diagnosis not present

## 2020-11-17 DIAGNOSIS — I110 Hypertensive heart disease with heart failure: Secondary | ICD-10-CM | POA: Diagnosis not present

## 2020-11-21 DIAGNOSIS — Z48815 Encounter for surgical aftercare following surgery on the digestive system: Secondary | ICD-10-CM | POA: Diagnosis not present

## 2020-11-21 DIAGNOSIS — I5032 Chronic diastolic (congestive) heart failure: Secondary | ICD-10-CM | POA: Diagnosis not present

## 2020-11-21 DIAGNOSIS — I110 Hypertensive heart disease with heart failure: Secondary | ICD-10-CM | POA: Diagnosis not present

## 2020-11-21 DIAGNOSIS — I480 Paroxysmal atrial fibrillation: Secondary | ICD-10-CM | POA: Diagnosis not present

## 2020-11-23 ENCOUNTER — Telehealth: Payer: Self-pay

## 2020-11-23 DIAGNOSIS — I480 Paroxysmal atrial fibrillation: Secondary | ICD-10-CM | POA: Diagnosis not present

## 2020-11-23 DIAGNOSIS — Z48815 Encounter for surgical aftercare following surgery on the digestive system: Secondary | ICD-10-CM | POA: Diagnosis not present

## 2020-11-23 DIAGNOSIS — I5032 Chronic diastolic (congestive) heart failure: Secondary | ICD-10-CM | POA: Diagnosis not present

## 2020-11-23 DIAGNOSIS — I110 Hypertensive heart disease with heart failure: Secondary | ICD-10-CM | POA: Diagnosis not present

## 2020-11-23 NOTE — Telephone Encounter (Signed)
Discharging from [facility] PT.   The patient has reached his goals.  B/p Low No symptoms 90/80.

## 2020-11-24 DIAGNOSIS — I5032 Chronic diastolic (congestive) heart failure: Secondary | ICD-10-CM | POA: Diagnosis not present

## 2020-11-24 DIAGNOSIS — I480 Paroxysmal atrial fibrillation: Secondary | ICD-10-CM | POA: Diagnosis not present

## 2020-11-24 DIAGNOSIS — I110 Hypertensive heart disease with heart failure: Secondary | ICD-10-CM | POA: Diagnosis not present

## 2020-11-24 DIAGNOSIS — Z48815 Encounter for surgical aftercare following surgery on the digestive system: Secondary | ICD-10-CM | POA: Diagnosis not present

## 2020-11-27 NOTE — Telephone Encounter (Signed)
Noted  

## 2020-11-27 NOTE — Telephone Encounter (Signed)
Fyi.

## 2020-11-30 ENCOUNTER — Encounter: Payer: Self-pay | Admitting: Internal Medicine

## 2020-12-04 DIAGNOSIS — R3912 Poor urinary stream: Secondary | ICD-10-CM | POA: Diagnosis not present

## 2020-12-04 DIAGNOSIS — R3914 Feeling of incomplete bladder emptying: Secondary | ICD-10-CM | POA: Diagnosis not present

## 2020-12-04 DIAGNOSIS — R350 Frequency of micturition: Secondary | ICD-10-CM | POA: Diagnosis not present

## 2020-12-04 NOTE — Telephone Encounter (Signed)
Pt advised to hold BP & hydrate. States he is not symptomatic at this time.  Clarified BP order of 1/2 pill once a day. Daughter states she gave him his pill this morning.

## 2020-12-06 ENCOUNTER — Other Ambulatory Visit: Payer: Self-pay | Admitting: Internal Medicine

## 2020-12-07 ENCOUNTER — Other Ambulatory Visit: Payer: Self-pay | Admitting: Internal Medicine

## 2020-12-15 ENCOUNTER — Telehealth: Payer: Self-pay

## 2020-12-15 NOTE — Telephone Encounter (Signed)
Please advise as Burna Mortimer with [facility] is calling in regards to some outstanding orders for the pt.  Outstanding orders are for 11/09/2020 for medication remove Iron supplements.  Outstanding orders are for 11/10/2020 to add unknown medication, Order number for this [phone].  Burna Mortimer can be reached at [phone]

## 2020-12-15 NOTE — Telephone Encounter (Signed)
Orders have been refaxed and confirmation fax has been received.

## 2020-12-22 ENCOUNTER — Encounter: Payer: Self-pay | Admitting: Internal Medicine

## 2020-12-27 NOTE — Telephone Encounter (Signed)
Spoke with pts daughter and was able to express that the low readings of BP for the pt is very low and he needs to be seen ASAP. Offered apptmnts for tomorrow and Fri morning, Pts daughter stated they were to early and went with the apptmnt for Monday 01/01/2021 at 1:40pm with Dr. Lawerance Bach.

## 2020-12-31 NOTE — Progress Notes (Signed)
Subjective:    Patient ID: Cory Hansen, male    DOB: 01-10-1921, 87 y.o.   MRN: [id number]  This visit occurred during the SARS-CoV-2 public health emergency.  Safety protocols were in place, including screening questions prior to the visit, additional usage of staff PPE, and extensive cleaning of exam room while observing appropriate contact time as indicated for disinfecting solutions.    HPI The patient is here for an acute visit.  He is here with his daughter.  He currently lives alone but has several family members coming on a daily basis.  Low BP - BP at home 80/42-113/56  HR  64-80 - mostly 70's.   He has felt ok, but occ has lightheadedness which may be from not eating as much.  On occasion he has felt something in his left chest, but his family members are not sure exactly what he is meant if it is pain or palpitation.  He has not complained of anything else.     Medications and allergies reviewed with patient and updated if appropriate.  Patient Active Problem List   Diagnosis Date Noted   Dementia (HCC) 10/21/2020   Choledocholithiasis 10/20/2020   CHF (congestive heart failure) (HCC) 06/20/2020   New onset atrial fibrillation (HCC) 06/19/2020   Hypokalemia 03/14/2020   Acute right ankle pain 05/07/2019   Diarrhea 10/27/2015   Urinary dribbling 07/26/2015   Routine general medical examination at a health care facility 10/13/2014   CT, CHEST, ABNORMAL 01/23/2009   Chronic bilateral low back pain with right-sided sciatica 01/17/2009   NEOPLASM, MALIGNANT, BLADDER, HX OF 01/17/2009   Hx of benign neoplasm of prostate 01/17/2009   Hyperlipidemia 01/16/2009   ARTHRITIS 01/16/2009   Weakness 01/16/2009    Current Outpatient Medications on File Prior to Visit  Medication Sig Dispense Refill   ELIQUIS 5 MG TABS tablet TAKE 1 TABLET BY MOUTH  TWICE DAILY 180 tablet 3   fluticasone (FLONASE) 50 MCG/ACT nasal spray Place 1 spray into both nostrils daily as needed  for allergies.     Glucosamine HCl (GLUCOSAMINE PO) Take by mouth daily.     metoprolol tartrate (LOPRESSOR) 25 MG tablet TAKE ONE-HALF TABLET BY  MOUTH TWICE DAILY 90 tablet 3   Misc Natural Products (GLUCOSAMINE-CHONDROITIN PLUS PO) Take 1 tablet by mouth daily.     pantoprazole (PROTONIX) 40 MG tablet Take 40 mg by mouth daily.     sennosides-docusate sodium (SENOKOT-S) 8.6-50 MG tablet Take 1 tablet by mouth daily.     tamsulosin (FLOMAX) 0.4 MG CAPS capsule Take 0.4 mg by mouth at bedtime.     No current facility-administered medications on file prior to visit.    Past Medical History:  Diagnosis Date   Arthritis    Atrial fibrillation (HCC)    Kidney stones    Shingles     Past Surgical History:  Procedure Laterality Date   AMPUTATION Left 02/22/2014   Procedure: INCISION AND DRAINAGE LEFT FOREFOOT AMPUTATION FIRST RAY;  Surgeon: Toni Arthurs, MD;  Location: [facility] OR;  Service: Orthopedics;  Laterality: Left;   APPENDECTOMY     ERCP N/A 10/25/2020   Procedure: ENDOSCOPIC RETROGRADE CHOLANGIOPANCREATOGRAPHY (ERCP);  Surgeon: Vida Rigger, MD;  Location: [facility];  Service: Endoscopy;  Laterality: N/A;   EYE SURGERY Bilateral    cataract surgery w/ lens implant   HERNIA REPAIR     inguinal hernia repair   REMOVAL OF STONES  10/25/2020   Procedure: REMOVAL OF STONES;  Surgeon:  Vida Rigger, MD;  Location: [facility];  Service: Endoscopy;;   Dennison Mascot  10/25/2020   Procedure: Dennison Mascot;  Surgeon: Vida Rigger, MD;  Location: [facility];  Service: Endoscopy;;    Social History   Socioeconomic History   Marital status: Widowed    Spouse name: Not on file   Number of children: Not on file   Years of education: Not on file   Highest education level: Not on file  Occupational History   Not on file  Tobacco Use   Smoking status: Never   Smokeless tobacco: Never  Vaping Use   Vaping Use: Never used  Substance and Sexual Activity   Alcohol use: Yes     Alcohol/week: 0.0 standard drinks    Comment: very rare   Drug use: No   Sexual activity: Not on file  Other Topics Concern   Not on file  Social History Narrative   Not on file   Social Determinants of Health   Financial Resource Strain: Not on file  Food Insecurity: Not on file  Transportation Needs: Not on file  Physical Activity: Not on file  Stress: Not on file  Social Connections: Not on file    Family History  Problem Relation Age of Onset   Heart disease Neg Hx        No heart disease in parents   Difficulty to know how accurate the review of systems is-patient does have some dementia. Review of Systems  Constitutional:  Negative for fever.  Respiratory:  Negative for shortness of breath.   Cardiovascular:  Negative for chest pain, palpitations and leg swelling.  Neurological:  Negative for light-headedness and headaches.      Objective:   Vitals:   01/01/21 1351  BP: 98/68  Pulse: 76  Temp: 98.2 F (36.8 C)  SpO2: 94%   BP Readings from Last 3 Encounters:  01/01/21 98/68  11/06/20 120/68  10/26/20 115/74   Wt Readings from Last 3 Encounters:  10/25/20 151 lb 7.3 oz (68.7 kg)  08/03/20 176 lb 9.6 oz (80.1 kg)  06/30/20 158 lb 11.7 oz (72 kg)   Body mass index is 21.12 kg/m.   Physical Exam    Constitutional: Appears well-developed and well-nourished. No distress.  Head: Normocephalic and atraumatic.  Neck: Neck supple. No tracheal deviation present. No thyromegaly present.  No cervical lymphadenopathy Cardiovascular: Normal rate, regular rhythm and normal heart sounds.  No murmur heard. No carotid bruit .  No edema Pulmonary/Chest: Effort normal and breath sounds normal. No respiratory distress. No has no wheezes. No rales.  Skin: Skin is warm and dry. Not diaphoretic.  Psychiatric: Normal mood and affect. Behavior is normal.       Assessment & Plan:    Atrial fibrillation, hypotension: First diagnosed 06/2020 while in the  hospital Currently in sinus rhythm here today-?  Paroxysmal Unfortunately he is not able to tell us if he is having palpitations or not so is difficult to know if he is going in and out of atrial fibrillation and if so how much He is experiencing hypotension and we will try discontinuing the metoprolol to see how he does with that Family will monitor heart rate and blood pressure at home and send an update in a couple of weeks Advised him to monitor for leg swelling, chest pain, palpitations and call us if he experiences any Will continue Eliquis 5 mg twice daily

## 2021-01-01 ENCOUNTER — Telehealth: Payer: Self-pay

## 2021-01-01 ENCOUNTER — Encounter: Payer: Self-pay | Admitting: Internal Medicine

## 2021-01-01 ENCOUNTER — Other Ambulatory Visit: Payer: Self-pay

## 2021-01-01 ENCOUNTER — Ambulatory Visit (INDEPENDENT_AMBULATORY_CARE_PROVIDER_SITE_OTHER): Payer: Medicare Other | Admitting: Internal Medicine

## 2021-01-01 VITALS — BP 98/68 | HR 76 | Temp 98.2°F | Ht 71.0 in

## 2021-01-01 DIAGNOSIS — I959 Hypotension, unspecified: Secondary | ICD-10-CM | POA: Diagnosis not present

## 2021-01-01 DIAGNOSIS — I480 Paroxysmal atrial fibrillation: Secondary | ICD-10-CM

## 2021-01-01 NOTE — Patient Instructions (Addendum)
    Medications changes include :   stop metoprolol for now - monitor his BP and HR at home.     Update [place] in a couple of weeks with his BP and HR readings at home.  Call sooner if he has can concerning symptoms ( leg swelling, palpitations, shortness of breath)

## 2021-01-01 NOTE — Telephone Encounter (Signed)
Spoke with patient's daughter [name]   Discuss PAP for Eliquis and eligibility requirements - estimated that patient would need to have spent ~$2000 OOP for prescriptions in 2022  The other option would be to sign patient up for the donut hole discount program [employer]) where he would be able to have Xarelto mailed to him for ~$85/month  Per patient's daughter patient has enough eliquis until November, will discuss options with patient and send mychart message about what they would like to do  Ellin Saba, PharmD Clinical Pharmacist, [facility]

## 2021-01-16 ENCOUNTER — Other Ambulatory Visit: Payer: Self-pay | Admitting: Internal Medicine

## 2021-01-24 ENCOUNTER — Encounter: Payer: Self-pay | Admitting: Internal Medicine

## 2021-04-02 ENCOUNTER — Emergency Department (HOSPITAL_COMMUNITY)
Admission: EM | Admit: 2021-04-02 | Discharge: 2021-04-02 | Disposition: A | Discharge status: Home / Self Care | Payer: Medicare Other | Attending: Emergency Medicine | Admitting: Emergency Medicine

## 2021-04-02 ENCOUNTER — Other Ambulatory Visit: Payer: Self-pay

## 2021-04-02 ENCOUNTER — Encounter (HOSPITAL_COMMUNITY): Payer: Self-pay

## 2021-04-02 ENCOUNTER — Emergency Department (HOSPITAL_COMMUNITY): Payer: Medicare Other

## 2021-04-02 DIAGNOSIS — M25552 Pain in left hip: Secondary | ICD-10-CM | POA: Diagnosis not present

## 2021-04-02 DIAGNOSIS — I509 Heart failure, unspecified: Secondary | ICD-10-CM | POA: Insufficient documentation

## 2021-04-02 DIAGNOSIS — Z8546 Personal history of malignant neoplasm of prostate: Secondary | ICD-10-CM | POA: Insufficient documentation

## 2021-04-02 DIAGNOSIS — Z20822 Contact with and (suspected) exposure to covid-19: Secondary | ICD-10-CM | POA: Insufficient documentation

## 2021-04-02 DIAGNOSIS — R051 Acute cough: Secondary | ICD-10-CM | POA: Insufficient documentation

## 2021-04-02 DIAGNOSIS — R059 Cough, unspecified: Secondary | ICD-10-CM | POA: Diagnosis not present

## 2021-04-02 DIAGNOSIS — I4891 Unspecified atrial fibrillation: Secondary | ICD-10-CM | POA: Insufficient documentation

## 2021-04-02 DIAGNOSIS — R1030 Lower abdominal pain, unspecified: Secondary | ICD-10-CM | POA: Diagnosis not present

## 2021-04-02 DIAGNOSIS — F0390 Unspecified dementia without behavioral disturbance: Secondary | ICD-10-CM | POA: Insufficient documentation

## 2021-04-02 DIAGNOSIS — R1032 Left lower quadrant pain: Secondary | ICD-10-CM | POA: Diagnosis not present

## 2021-04-02 DIAGNOSIS — Z7901 Long term (current) use of anticoagulants: Secondary | ICD-10-CM | POA: Diagnosis not present

## 2021-04-02 DIAGNOSIS — M47816 Spondylosis without myelopathy or radiculopathy, lumbar region: Secondary | ICD-10-CM | POA: Diagnosis not present

## 2021-04-02 DIAGNOSIS — M1612 Unilateral primary osteoarthritis, left hip: Secondary | ICD-10-CM | POA: Diagnosis not present

## 2021-04-02 LAB — URINALYSIS, ROUTINE W REFLEX MICROSCOPIC
Bilirubin Urine: NEGATIVE
Glucose, UA: NEGATIVE mg/dL
Hgb urine dipstick: NEGATIVE
Leukocytes,Ua: NEGATIVE
Nitrite: NEGATIVE
Specific Gravity, Urine: 1.025 (ref 1.005–1.030)
pH: 6 (ref 5.0–8.0)

## 2021-04-02 LAB — RESP PANEL BY RT-PCR (FLU A&B, COVID) ARPGX2
Influenza A by PCR: NEGATIVE
Influenza B by PCR: NEGATIVE
SARS Coronavirus 2 by RT PCR: NEGATIVE

## 2021-04-02 MED ORDER — PREDNISONE 20 MG PO TABS: 40.0000 mg | ORAL_TABLET | Freq: Every day | ORAL | 0 refills | Status: AC

## 2021-04-02 MED ORDER — BENZONATATE 100 MG PO CAPS: 100.0000 mg | ORAL_CAPSULE | Freq: Three times a day (TID) | ORAL | 0 refills | Status: DC | PRN

## 2021-04-02 NOTE — ED Provider Notes (Signed)
[facility] DEPT Provider Note   CSN: [id number] Arrival date & time: 04/02/21  1132     History Chief Complaint  Patient presents with   Cough   Groin Pain    Cory Hansen is a 87 y.o. male presents the emergency department with a chief complaint of cough and pain to the left side of his groin.  States that cough has been present over the last 2 to 3 weeks.  Cough is producing minimal amount of yellow sputum.  States that cough has improved slightly however has not gone away.  Denies any modalities tried to alleviate his symptoms.  Patient complains of pain to left groin.  States that pain is only present when he coughs.  Patient is also having pain to his left hip.  Denies any recent falls or injuries.  Patient's daughter at bedside and also provides information.   Cough Associated symptoms: no chest pain, no chills, no fever, no headaches, no rash, no rhinorrhea, no shortness of breath and no sore throat   Groin Pain Pertinent negatives include no chest pain, no abdominal pain, no headaches and no shortness of breath.      Past Medical History:  Diagnosis Date   Arthritis    Atrial fibrillation [facility])    Kidney stones    Shingles     Patient Active Problem List   Diagnosis Date Noted   Dementia ([place]) 10/21/2020   Choledocholithiasis 10/20/2020   CHF (congestive heart failure) ([place]) 06/20/2020   New onset atrial fibrillation ([place]) 06/19/2020   Hypokalemia 03/14/2020   Acute right ankle pain 05/07/2019   Diarrhea 10/27/2015   Urinary dribbling 07/26/2015   Routine general medical examination at a health care facility 10/13/2014   CT, CHEST, ABNORMAL 01/23/2009   Chronic bilateral low back pain with right-sided sciatica 01/17/2009   NEOPLASM, MALIGNANT, BLADDER, HX OF 01/17/2009   Hx of benign neoplasm of prostate 01/17/2009   Hyperlipidemia 01/16/2009   ARTHRITIS 01/16/2009   Weakness 01/16/2009    Past Surgical History:   Procedure Laterality Date   AMPUTATION Left 02/22/2014   Procedure: INCISION AND DRAINAGE LEFT FOREFOOT AMPUTATION FIRST RAY;  Surgeon: Wylene Simmer, MD;  Location: [facility];  Service: Orthopedics;  Laterality: Left;   APPENDECTOMY     ERCP N/A 10/25/2020   Procedure: ENDOSCOPIC RETROGRADE CHOLANGIOPANCREATOGRAPHY (ERCP);  Surgeon: Clarene Essex, MD;  Location: [facility];  Service: Endoscopy;  Laterality: N/A;   EYE SURGERY Bilateral    cataract surgery w/ lens implant   HERNIA REPAIR     inguinal hernia repair   REMOVAL OF STONES  10/25/2020   Procedure: REMOVAL OF STONES;  Surgeon: Clarene Essex, MD;  Location: [facility];  Service: Endoscopy;;   SPHINCTEROTOMY  10/25/2020   Procedure: Joan Mayans;  Surgeon: Clarene Essex, MD;  Location: [facility];  Service: Endoscopy;;       Family History  Problem Relation Age of Onset   Heart disease Neg Hx        No heart disease in parents    Social History   Tobacco Use   Smoking status: Never   Smokeless tobacco: Never  Vaping Use   Vaping Use: Never used  Substance Use Topics   Alcohol use: Yes    Alcohol/week: 0.0 standard drinks    Comment: very rare   Drug use: No    Home Medications Prior to Admission medications   Medication Sig Start Date End Date Taking? Authorizing Provider  ELIQUIS 5 MG  TABS tablet TAKE 1 TABLET BY MOUTH  TWICE DAILY 12/08/20   Hoyt Koch, MD  fluticasone [facility]) 50 MCG/ACT nasal spray Place 1 spray into both nostrils daily as needed for allergies.    [provider]  Glucosamine HCl (GLUCOSAMINE PO) Take by mouth daily.    [provider]  metoprolol tartrate (LOPRESSOR) 25 MG tablet TAKE ONE-HALF TABLET BY  MOUTH TWICE DAILY 12/07/20   Hoyt Koch, MD  Misc Natural Products (GLUCOSAMINE-CHONDROITIN PLUS PO) Take 1 tablet by mouth daily.    [provider]  pantoprazole (PROTONIX) 40 MG tablet TAKE 1 TABLET BY MOUTH  DAILY 01/17/21   Hoyt Koch,  MD  sennosides-docusate sodium (SENOKOT-S) 8.6-50 MG tablet Take 1 tablet by mouth daily.    [provider]  tamsulosin (FLOMAX) 0.4 MG CAPS capsule Take 0.4 mg by mouth at bedtime. 10/12/20   [provider]    Allergies    Patient has no known allergies.  Review of Systems   Review of Systems  Constitutional:  Negative for chills and fever.  HENT:  Positive for congestion. Negative for rhinorrhea and sore throat.   Eyes:  Negative for visual disturbance.  Respiratory:  Positive for cough. Negative for shortness of breath.   Cardiovascular:  Negative for chest pain.  Gastrointestinal:  Negative for abdominal distention, abdominal pain, anal bleeding, blood in stool, constipation, diarrhea, nausea, rectal pain and vomiting.  Genitourinary:  Negative for difficulty urinating, dysuria, flank pain, frequency, penile pain, penile swelling, scrotal swelling, testicular pain and urgency.  Musculoskeletal:  Positive for arthralgias. Negative for back pain and neck pain.  Skin:  Negative for color change and rash.  Neurological:  Negative for dizziness, syncope, light-headedness and headaches.  Psychiatric/Behavioral:  Negative for confusion.    Physical Exam Updated Vital Signs BP (!) 106/58 (BP Location: Left Arm)    Pulse 91    Temp 97.9 F (36.6 C) (Oral)    Resp 18    Ht 5\' 11"  (1.803 m)    Wt 76.7 kg    SpO2 98%    BMI 23.57 kg/m   Physical Exam Vitals and nursing note reviewed. Exam conducted with a chaperone present (Male RN present as chaperone).  Constitutional:      General: He is not in acute distress.    Appearance: He is not ill-appearing, toxic-appearing or diaphoretic.  HENT:     Head: Normocephalic.  Eyes:     General: No scleral icterus.       Right eye: No discharge.        Left eye: No discharge.  Cardiovascular:     Rate and Rhythm: Normal rate.  Pulmonary:     Effort: Pulmonary effort is normal. No tachypnea, bradypnea or respiratory  distress.     Breath sounds: Normal breath sounds. No stridor. No wheezing, rhonchi or rales.  Abdominal:     Hernia: There is no hernia in the left inguinal area or right inguinal area.  Genitourinary:    Pubic Area: No rash or pubic lice.      Penis: Normal and uncircumcised. No phimosis, paraphimosis, hypospadias, erythema, tenderness, discharge, swelling or lesions.      Testes: Normal. Cremasteric reflex is present.     Epididymis:     Right: Normal.     Tanner stage (genital): 5.  Lymphadenopathy:     Lower Body: No right inguinal adenopathy. No left inguinal adenopathy.  Skin:    General: Skin is warm and  dry.  Neurological:     General: No focal deficit present.     Mental Status: He is alert.  Psychiatric:        Behavior: Behavior is cooperative.    ED Results / Procedures / Treatments   Labs (all labs ordered are listed, but only abnormal results are displayed) Labs Reviewed  URINALYSIS, ROUTINE W REFLEX MICROSCOPIC - Abnormal; Notable for the following components:      Result Value   APPearance HAZY (*)    Ketones, ur TRACE (*)    Protein, ur TRACE (*)    Bacteria, UA FEW (*)    All other components within normal limits  RESP PANEL BY RT-PCR (FLU A&B, COVID) ARPGX2  URINE CULTURE    EKG None  Radiology DG Chest 2 View  Result Date: 04/02/2021 CLINICAL DATA:  Productive cough for 2-3 weeks EXAM: CHEST - 2 VIEW COMPARISON:  10/20/2020 FINDINGS: Frontal and lateral views of the chest demonstrate a stable cardiac silhouette. Stable bilateral pleural calcifications. No acute airspace disease, effusion, or pneumothorax. Chronic right pleural thickening versus trace right effusion. No acute bony abnormalities. IMPRESSION: 1. No acute intrathoracic process. 2. Stable chronic right pleural thickening versus trace right effusion. 3. Stable bilateral calcified pleural plaques. Electronically Signed   By: Randa Ngo M.D.   On: 04/02/2021 16:12   DG Hip Unilat W or Wo  Pelvis 2-3 Views Left  Result Date: 04/02/2021 CLINICAL DATA:  Left inguinal pain EXAM: DG HIP (WITH OR WITHOUT PELVIS) 2-3V LEFT COMPARISON:  None. FINDINGS: Frontal view of the pelvis as well as frontal and frogleg lateral views of the left hip are obtained. Bones are osteopenic. No acute fracture, subluxation, or dislocation. Mild symmetrical bilateral hip osteoarthritis. Prominent lower lumbar spondylosis. Sacroiliac joints are normal. IMPRESSION: 1. No acute or destructive bony lesions. 2. Mild bilateral hip osteoarthritis. 3. Lower lumbar spondylosis. Electronically Signed   By: Randa Ngo M.D.   On: 04/02/2021 16:15    Procedures Procedures   Medications Ordered in ED Medications - No data to display  ED Course  I have reviewed the triage vital signs and the nursing notes.  Pertinent labs & imaging results that were available during my care of the patient were reviewed by me and considered in my medical decision making (see chart for details).    MDM Rules/Calculators/A&P                          Alert 87 year old male no acute distress, nontoxic-appearing.  Presents to the emergency department with a chief complaint of cough, left groin pain, and left hip pain.  GU exam is unremarkable.  No hernia reproduced on Valsalva maneuver or cough.  Due to complaints of left groin pain will obtain urinalysis.  Urinalysis shows no signs of urinary tract infection.  Patient negative for COVID-19 and influenza.  Will obtain chest x-ray to evaluate for possible signs of pneumonia due to patient's cough x3 weeks.  Chest x-ray shows no active cardiopulmonary disease  Patient report of left hip pain will obtain x-ray imaging.  Patient imaging shows no acute osseous abnormality  Due to persistent cough concern for possible bronchitis.  Will prescribe patient with short course of prednisone as well as Tessalon to help with his symptoms.  Patient to follow-up with PCP for repeat evaluation if  symptoms do not improve.  Discussed results, findings, treatment and follow up. Patient and daughter advised of return precautions. Patient and  daughter verbalized understanding and agreed with plan.  Patient care discussed with attending physician Dr. Wilkie Aye       Final Clinical Impression(s) / ED Diagnoses Final diagnoses:  Acute cough  Left inguinal pain  Left hip pain    Rx / DC Orders ED Discharge Orders          Ordered    predniSONE (DELTASONE) 20 MG tablet  Daily        04/02/21 1738    benzonatate (TESSALON) 100 MG capsule  Every 8 hours PRN        04/02/21 1738             Haskel Schroeder, PA-C 04/02/21 2340    Horton, Clabe Seal, DO 04/03/21 1636

## 2021-04-02 NOTE — ED Triage Notes (Signed)
Patient c/o a productive cough with yellow sputum x 2-3 weeks. Patient c/o left groin pain and states it hurts worse when he coughs

## 2021-04-02 NOTE — Discharge Instructions (Addendum)
[facility] to be evaluated for your cough, left hip pain, and left groin pain.  Your physical exam was reassuring.  The x-ray of your chest showed no signs of pneumonia.  The x-ray of your hip showed no broken bones or dislocations.  It did show that you have some degenerative and osteoarthritic changes.  The urinalysis obtained showed no signs of a urinary tract infection.  Due to your cough we are giving you a medication called Tessalon which you may take every 8 hours to help with your cough.  I have also given you a short course of steroids as well.  I have given you a prescription for steroids today.  Some common side effects include feelings of extra energy, feeling warm, increased appetite, and stomach upset.  If you are diabetic your sugars may run higher than usual.   Please follow-up closely with your primary care provider for repeat evaluation.  Get help right away if: You cough up blood. You have difficulty breathing. Your heartbeat is very fast. Your pain does not go away as soon as your health care provider told you to expect. You cannot stop vomiting. Your pain is only in areas of the abdomen, such as the right side or the left lower portion of the abdomen. Pain on the right side could be caused by appendicitis. You have bloody or black stools, or stools that look like tar. You have severe pain, cramping, or bloating in your abdomen. You have signs of dehydration, such as: Dark urine, very little urine, or no urine. Cracked lips. Dry mouth. Sunken eyes. Sleepiness. Weakness. You have trouble breathing or chest pain.

## 2021-04-03 LAB — URINE CULTURE: Culture: NO GROWTH

## 2021-04-16 ENCOUNTER — Telehealth: Payer: Self-pay

## 2021-04-16 ENCOUNTER — Other Ambulatory Visit: Payer: Self-pay

## 2021-04-16 ENCOUNTER — Encounter: Payer: Self-pay | Admitting: Internal Medicine

## 2021-04-16 ENCOUNTER — Ambulatory Visit (INDEPENDENT_AMBULATORY_CARE_PROVIDER_SITE_OTHER): Payer: Medicare Other | Admitting: Internal Medicine

## 2021-04-16 VITALS — BP 128/82 | HR 74 | Resp 18 | Ht 71.0 in | Wt 177.0 lb

## 2021-04-16 DIAGNOSIS — R5383 Other fatigue: Secondary | ICD-10-CM

## 2021-04-16 DIAGNOSIS — I4821 Permanent atrial fibrillation: Secondary | ICD-10-CM | POA: Diagnosis not present

## 2021-04-16 DIAGNOSIS — K8050 Calculus of bile duct without cholangitis or cholecystitis without obstruction: Secondary | ICD-10-CM | POA: Diagnosis not present

## 2021-04-16 DIAGNOSIS — R531 Weakness: Secondary | ICD-10-CM | POA: Diagnosis not present

## 2021-04-16 DIAGNOSIS — I5032 Chronic diastolic (congestive) heart failure: Secondary | ICD-10-CM | POA: Diagnosis not present

## 2021-04-16 DIAGNOSIS — F03B0 Unspecified dementia, moderate, without behavioral disturbance, psychotic disturbance, mood disturbance, and anxiety: Secondary | ICD-10-CM

## 2021-04-16 LAB — CBC
HCT: 25.1 % — ABNORMAL LOW (ref 39.0–52.0)
Hemoglobin: 7.4 g/dL — CL (ref 13.0–17.0)
MCHC: 29.3 g/dL — ABNORMAL LOW (ref 30.0–36.0)
MCV: 81.8 fl (ref 78.0–100.0)
Platelets: 214 10*3/uL (ref 150.0–400.0)
RBC: 3.07 Mil/uL — ABNORMAL LOW (ref 4.22–5.81)
RDW: 18.4 % — ABNORMAL HIGH (ref 11.5–15.5)
WBC: 7.5 10*3/uL (ref 4.0–10.5)

## 2021-04-16 LAB — COMPREHENSIVE METABOLIC PANEL
ALT: 12 U/L (ref 0–53)
AST: 13 U/L (ref 0–37)
Albumin: 3.8 g/dL (ref 3.5–5.2)
Alkaline Phosphatase: 91 U/L (ref 39–117)
BUN: 23 mg/dL (ref 6–23)
CO2: 24 mEq/L (ref 19–32)
Calcium: 8.9 mg/dL (ref 8.4–10.5)
Chloride: 110 mEq/L (ref 96–112)
Creatinine, Ser: 1.32 mg/dL (ref 0.40–1.50)
GFR: 45.01 mL/min — ABNORMAL LOW (ref >60.00)
Glucose, Bld: 97 mg/dL (ref 70–99)
Potassium: 5 mEq/L (ref 3.5–5.1)
Sodium: 141 mEq/L (ref 135–145)
Total Bilirubin: 0.4 mg/dL (ref 0.2–1.2)
Total Protein: 6.2 g/dL (ref 6.0–8.3)

## 2021-04-16 LAB — VITAMIN D 25 HYDROXY (VIT D DEFICIENCY, FRACTURES): VITD: 26.64 ng/mL — ABNORMAL LOW (ref 30.00–100.00)

## 2021-04-16 LAB — VITAMIN B12: Vitamin B-12: 447 pg/mL (ref 211–911)

## 2021-04-16 LAB — BRAIN NATRIURETIC PEPTIDE: Pro B Natriuretic peptide (BNP): 595 pg/mL — ABNORMAL HIGH (ref 0.0–100.0)

## 2021-04-16 LAB — TSH: TSH: 2.21 u[IU]/mL (ref 0.35–5.50)

## 2021-04-16 NOTE — Patient Instructions (Addendum)
Try smaller bites of food and sip water in between bites. You can do a chin tuck after bites to help with swallowing.  We are checking the labs today.

## 2021-04-16 NOTE — Telephone Encounter (Signed)
CRITICAL VALUE STICKER  CRITICAL VALUE: Hemoglobin 7.4, Hematocrit 25.1  RECEIVER (on-site recipient of call): [place] D, [place] NOTIFIED: 4:43 pm   MESSENGER (representative from lab): Kenney Houseman  MD NOTIFIED: Sharlet Salina   TIME OF NOTIFICATION: 4:46 pm   RESPONSE:

## 2021-04-16 NOTE — Progress Notes (Signed)
° °  Subjective:   Patient ID: Cory Hansen, male    DOB: 06/26/1920, 88 y.o.   MRN: [id number]  HPI The patient is a 88 YO man coming in for some coughing with eating, f/u ER visit.   Review of Systems  Constitutional:  Positive for appetite change and fatigue.  HENT: Negative.    Eyes: Negative.   Respiratory:  Positive for cough. Negative for chest tightness and shortness of breath.   Cardiovascular:  Negative for chest pain, palpitations and leg swelling.  Gastrointestinal:  Negative for abdominal distention, abdominal pain, constipation, diarrhea, nausea and vomiting.  Musculoskeletal: Negative.   Skin: Negative.   Neurological:        Memory change  Psychiatric/Behavioral: Negative.     Objective:  Physical Exam Constitutional:      Appearance: He is well-developed.  HENT:     Head: Normocephalic and atraumatic.  Cardiovascular:     Rate and Rhythm: Normal rate. Rhythm irregular.  Pulmonary:     Effort: Pulmonary effort is normal. No respiratory distress.     Breath sounds: Normal breath sounds. No wheezing or rales.  Abdominal:     General: Bowel sounds are normal. There is no distension.     Palpations: Abdomen is soft.     Tenderness: There is no abdominal tenderness. There is no rebound.  Musculoskeletal:     Cervical back: Normal range of motion.  Skin:    General: Skin is warm and dry.  Neurological:     Mental Status: He is alert.     Coordination: Coordination abnormal.     Comments: Oriented to name and place    Vitals:   04/16/21 1344  BP: 128/82  Pulse: 74  Resp: 18  SpO2: 92%  Weight: 177 lb (80.3 kg)  Height: 5\' 11"  (1.803 m)    This visit occurred during the SARS-CoV-2 public health emergency.  Safety protocols were in place, including screening questions prior to the visit, additional usage of staff PPE, and extensive cleaning of exam room while observing appropriate contact time as indicated for disinfecting solutions.   Assessment &  Plan:

## 2021-04-17 NOTE — Telephone Encounter (Signed)
Addressed via result notes ?

## 2021-04-20 NOTE — Assessment & Plan Note (Signed)
Sounds to be progressive. Some coughing with eating, forgetting when to eat. Repeating a lot. No behavioral issues.

## 2021-04-20 NOTE — Assessment & Plan Note (Signed)
Appears to be normal volume today.

## 2021-04-20 NOTE — Telephone Encounter (Signed)
Patient's daughter Harriett Sine inquiring if patient needs to return for additional labs or a follow-up appt  Please advise

## 2021-04-20 NOTE — Telephone Encounter (Signed)
Called pt's Cory Hansen. LDVM at [phone] with instructions. Office number was provided.   If pt's daughter calls back schedule a 1-2 week appt. If she has noticed any bleeding or blood in his stool he needs to be taken the ER.

## 2021-04-20 NOTE — Assessment & Plan Note (Signed)
Checking TSH, B12, vitamin D, CBC, CMP to assess.

## 2021-04-20 NOTE — Assessment & Plan Note (Signed)
Rate controlled on metoprolol. Taking eliquis for anticoagulation. Checking CMP and CBC.

## 2021-04-20 NOTE — Assessment & Plan Note (Signed)
Checking CMP and some abdominal pain intermittent.

## 2021-05-29 ENCOUNTER — Ambulatory Visit: Payer: Medicare Other | Admitting: Internal Medicine

## 2021-05-31 ENCOUNTER — Emergency Department (HOSPITAL_COMMUNITY): Payer: Medicare Other

## 2021-05-31 ENCOUNTER — Inpatient Hospital Stay (HOSPITAL_COMMUNITY)
Admission: EM | Admit: 2021-05-31 | Discharge: 2021-06-04 | DRG: 871 | Disposition: A | Discharge status: Skilled Nursing Facility | Payer: Medicare Other | Attending: Internal Medicine | Admitting: Internal Medicine

## 2021-05-31 ENCOUNTER — Other Ambulatory Visit: Payer: Self-pay

## 2021-05-31 ENCOUNTER — Ambulatory Visit (INDEPENDENT_AMBULATORY_CARE_PROVIDER_SITE_OTHER): Payer: Medicare Other | Admitting: Nurse Practitioner

## 2021-05-31 ENCOUNTER — Encounter (HOSPITAL_COMMUNITY): Payer: Self-pay | Admitting: *Deleted

## 2021-05-31 ENCOUNTER — Encounter: Payer: Self-pay | Admitting: Nurse Practitioner

## 2021-05-31 VITALS — BP 138/70 | HR 105 | Temp 98.4°F | Ht 71.0 in

## 2021-05-31 DIAGNOSIS — G9340 Encephalopathy, unspecified: Secondary | ICD-10-CM

## 2021-05-31 DIAGNOSIS — Z79899 Other long term (current) drug therapy: Secondary | ICD-10-CM

## 2021-05-31 DIAGNOSIS — E872 Acidosis, unspecified: Secondary | ICD-10-CM | POA: Diagnosis present

## 2021-05-31 DIAGNOSIS — D509 Iron deficiency anemia, unspecified: Secondary | ICD-10-CM | POA: Diagnosis not present

## 2021-05-31 DIAGNOSIS — D631 Anemia in chronic kidney disease: Secondary | ICD-10-CM | POA: Diagnosis present

## 2021-05-31 DIAGNOSIS — Z8551 Personal history of malignant neoplasm of bladder: Secondary | ICD-10-CM | POA: Diagnosis not present

## 2021-05-31 DIAGNOSIS — G9341 Metabolic encephalopathy: Secondary | ICD-10-CM | POA: Diagnosis not present

## 2021-05-31 DIAGNOSIS — Z7901 Long term (current) use of anticoagulants: Secondary | ICD-10-CM

## 2021-05-31 DIAGNOSIS — R350 Frequency of micturition: Secondary | ICD-10-CM | POA: Diagnosis not present

## 2021-05-31 DIAGNOSIS — E46 Unspecified protein-calorie malnutrition: Secondary | ICD-10-CM | POA: Diagnosis not present

## 2021-05-31 DIAGNOSIS — I4821 Permanent atrial fibrillation: Secondary | ICD-10-CM | POA: Diagnosis present

## 2021-05-31 DIAGNOSIS — K5900 Constipation, unspecified: Secondary | ICD-10-CM | POA: Diagnosis not present

## 2021-05-31 DIAGNOSIS — I5032 Chronic diastolic (congestive) heart failure: Secondary | ICD-10-CM | POA: Diagnosis not present

## 2021-05-31 DIAGNOSIS — Z66 Do not resuscitate: Secondary | ICD-10-CM | POA: Diagnosis not present

## 2021-05-31 DIAGNOSIS — Z8616 Personal history of COVID-19: Secondary | ICD-10-CM | POA: Diagnosis not present

## 2021-05-31 DIAGNOSIS — R0602 Shortness of breath: Secondary | ICD-10-CM | POA: Diagnosis not present

## 2021-05-31 DIAGNOSIS — Z1159 Encounter for screening for other viral diseases: Secondary | ICD-10-CM | POA: Diagnosis not present

## 2021-05-31 DIAGNOSIS — A419 Sepsis, unspecified organism: Secondary | ICD-10-CM

## 2021-05-31 DIAGNOSIS — R410 Disorientation, unspecified: Secondary | ICD-10-CM | POA: Diagnosis not present

## 2021-05-31 DIAGNOSIS — I4891 Unspecified atrial fibrillation: Secondary | ICD-10-CM | POA: Diagnosis not present

## 2021-05-31 DIAGNOSIS — R32 Unspecified urinary incontinence: Secondary | ICD-10-CM

## 2021-05-31 DIAGNOSIS — R531 Weakness: Secondary | ICD-10-CM | POA: Diagnosis not present

## 2021-05-31 DIAGNOSIS — Z20822 Contact with and (suspected) exposure to covid-19: Secondary | ICD-10-CM | POA: Diagnosis not present

## 2021-05-31 DIAGNOSIS — D649 Anemia, unspecified: Secondary | ICD-10-CM

## 2021-05-31 DIAGNOSIS — M5441 Lumbago with sciatica, right side: Secondary | ICD-10-CM | POA: Diagnosis not present

## 2021-05-31 DIAGNOSIS — R059 Cough, unspecified: Secondary | ICD-10-CM | POA: Diagnosis not present

## 2021-05-31 DIAGNOSIS — J189 Pneumonia, unspecified organism: Secondary | ICD-10-CM | POA: Diagnosis present

## 2021-05-31 DIAGNOSIS — M6281 Muscle weakness (generalized): Secondary | ICD-10-CM | POA: Diagnosis not present

## 2021-05-31 DIAGNOSIS — M129 Arthropathy, unspecified: Secondary | ICD-10-CM | POA: Diagnosis not present

## 2021-05-31 DIAGNOSIS — H353134 Nonexudative age-related macular degeneration, bilateral, advanced atrophic with subfoveal involvement: Secondary | ICD-10-CM | POA: Diagnosis not present

## 2021-05-31 DIAGNOSIS — R2689 Other abnormalities of gait and mobility: Secondary | ICD-10-CM | POA: Diagnosis not present

## 2021-05-31 DIAGNOSIS — F0392 Unspecified dementia, unspecified severity, with psychotic disturbance: Secondary | ICD-10-CM | POA: Diagnosis not present

## 2021-05-31 DIAGNOSIS — K219 Gastro-esophageal reflux disease without esophagitis: Secondary | ICD-10-CM | POA: Diagnosis not present

## 2021-05-31 DIAGNOSIS — N1831 Chronic kidney disease, stage 3a: Secondary | ICD-10-CM | POA: Diagnosis present

## 2021-05-31 DIAGNOSIS — R0989 Other specified symptoms and signs involving the circulatory and respiratory systems: Secondary | ICD-10-CM

## 2021-05-31 DIAGNOSIS — Z7401 Bed confinement status: Secondary | ICD-10-CM | POA: Diagnosis not present

## 2021-05-31 DIAGNOSIS — J9601 Acute respiratory failure with hypoxia: Secondary | ICD-10-CM

## 2021-05-31 DIAGNOSIS — J328 Other chronic sinusitis: Secondary | ICD-10-CM | POA: Diagnosis not present

## 2021-05-31 HISTORY — DX: Sepsis, unspecified organism: A41.9

## 2021-05-31 HISTORY — DX: Encephalopathy, unspecified: G93.40

## 2021-05-31 LAB — CBC WITH DIFFERENTIAL/PLATELET
Abs Immature Granulocytes: 0.03 10*3/uL (ref 0.00–0.07)
Basophils Absolute: 0.1 10*3/uL (ref 0.0–0.1)
Basophils Relative: 0 %
Eosinophils Absolute: 0.2 10*3/uL (ref 0.0–0.5)
Eosinophils Relative: 2 %
HCT: 32.5 % — ABNORMAL LOW (ref 39.0–52.0)
Hemoglobin: 9.4 g/dL — ABNORMAL LOW (ref 13.0–17.0)
Immature Granulocytes: 0 %
Lymphocytes Relative: 12 %
Lymphs Abs: 1.5 10*3/uL (ref 0.7–4.0)
MCH: 26.2 pg (ref 26.0–34.0)
MCHC: 28.9 g/dL — ABNORMAL LOW (ref 30.0–36.0)
MCV: 90.5 fL (ref 80.0–100.0)
Monocytes Absolute: 1.2 10*3/uL — ABNORMAL HIGH (ref 0.1–1.0)
Monocytes Relative: 10 %
Neutro Abs: 9.2 10*3/uL — ABNORMAL HIGH (ref 1.7–7.7)
Neutrophils Relative %: 76 %
Platelets: 268 10*3/uL (ref 150–400)
RBC: 3.59 MIL/uL — ABNORMAL LOW (ref 4.22–5.81)
RDW: 20.3 % — ABNORMAL HIGH (ref 11.5–15.5)
WBC: 12.2 10*3/uL — ABNORMAL HIGH (ref 4.0–10.5)
nRBC: 0 % (ref 0.0–0.2)

## 2021-05-31 LAB — COMPREHENSIVE METABOLIC PANEL
ALT: 23 U/L (ref 0–44)
AST: 27 U/L (ref 15–41)
Albumin: 3.6 g/dL (ref 3.5–5.0)
Alkaline Phosphatase: 89 U/L (ref 38–126)
Anion gap: 7 (ref 5–15)
BUN: 20 mg/dL (ref 8–23)
CO2: 27 mmol/L (ref 22–32)
Calcium: 8.8 mg/dL — ABNORMAL LOW (ref 8.9–10.3)
Chloride: 104 mmol/L (ref 98–111)
Creatinine, Ser: 1.18 mg/dL (ref 0.61–1.24)
GFR, Estimated: 56 mL/min — ABNORMAL LOW (ref >60)
Glucose, Bld: 93 mg/dL (ref 70–99)
Potassium: 4.6 mmol/L (ref 3.5–5.1)
Sodium: 138 mmol/L (ref 135–145)
Total Bilirubin: 0.5 mg/dL (ref 0.3–1.2)
Total Protein: 6.8 g/dL (ref 6.5–8.1)

## 2021-05-31 LAB — BRAIN NATRIURETIC PEPTIDE: B Natriuretic Peptide: 528.1 pg/mL — ABNORMAL HIGH (ref 0.0–100.0)

## 2021-05-31 LAB — URINALYSIS, ROUTINE W REFLEX MICROSCOPIC
Bilirubin Urine: NEGATIVE
Glucose, UA: NEGATIVE mg/dL
Hgb urine dipstick: NEGATIVE
Ketones, ur: NEGATIVE mg/dL
Leukocytes,Ua: NEGATIVE
Nitrite: NEGATIVE
Protein, ur: NEGATIVE mg/dL
Specific Gravity, Urine: 1.014 (ref 1.005–1.030)
pH: 5 (ref 5.0–8.0)

## 2021-05-31 LAB — POCT URINALYSIS DIPSTICK
Blood, UA: NEGATIVE
Glucose, UA: NEGATIVE
Ketones, UA: NEGATIVE
Leukocytes, UA: NEGATIVE
Nitrite, UA: NEGATIVE
Protein, UA: POSITIVE — AB
Spec Grav, UA: 1.025 (ref 1.010–1.025)
Urobilinogen, UA: 0.2 E.U./dL
pH, UA: 6 (ref 5.0–8.0)

## 2021-05-31 LAB — BLOOD GAS, VENOUS
Acid-base deficit: 0.2 mmol/L (ref 0.0–2.0)
Bicarbonate: 26.4 mmol/L (ref 20.0–28.0)
O2 Saturation: 36 %
Patient temperature: 37
pCO2, Ven: 50 mmHg (ref 44–60)
pH, Ven: 7.33 (ref 7.25–7.43)
pO2, Ven: <31 mmHg — CL (ref 32–45)

## 2021-05-31 LAB — LACTIC ACID, PLASMA
Lactic Acid, Venous: 2.1 mmol/L (ref 0.5–1.9)
Lactic Acid, Venous: 1.7 mmol/L (ref 0.5–1.9)

## 2021-05-31 LAB — RESP PANEL BY RT-PCR (FLU A&B, COVID) ARPGX2
Influenza A by PCR: NEGATIVE
Influenza B by PCR: NEGATIVE
SARS Coronavirus 2 by RT PCR: NEGATIVE

## 2021-05-31 LAB — PROCALCITONIN: Procalcitonin: 0.3 ng/mL

## 2021-05-31 MED ORDER — SODIUM CHLORIDE 0.9 % IV SOLN: 500.0000 mg | Freq: Once | INTRAVENOUS | Status: DC

## 2021-05-31 MED ORDER — SODIUM CHLORIDE 0.9 % IV SOLN
2.0000 g | INTRAVENOUS | Status: DC
  Administered 2021-06-01 – 2021-06-03 (×3): 2 g via INTRAVENOUS
  Filled 2021-05-31 (×3): qty 20

## 2021-05-31 MED ORDER — SODIUM CHLORIDE 0.9 % IV BOLUS
500.0000 mL | Freq: Once | INTRAVENOUS | Status: AC
  Administered 2021-05-31: 500 mL via INTRAVENOUS

## 2021-05-31 MED ORDER — ACETAMINOPHEN 650 MG RE SUPP: 650.0000 mg | Freq: Four times a day (QID) | RECTAL | Status: DC | PRN

## 2021-05-31 MED ORDER — ONDANSETRON HCL 4 MG PO TABS: 4.0000 mg | ORAL_TABLET | Freq: Four times a day (QID) | ORAL | Status: DC | PRN

## 2021-05-31 MED ORDER — PANTOPRAZOLE SODIUM 40 MG PO TBEC
40.0000 mg | DELAYED_RELEASE_TABLET | Freq: Every day | ORAL | Status: DC
  Administered 2021-06-01 – 2021-06-04 (×4): 40 mg via ORAL
  Filled 2021-05-31 (×4): qty 1

## 2021-05-31 MED ORDER — ACETAMINOPHEN 325 MG PO TABS: 650.0000 mg | ORAL_TABLET | Freq: Four times a day (QID) | ORAL | Status: DC | PRN

## 2021-05-31 MED ORDER — SODIUM CHLORIDE 0.9 % IV SOLN: 1.0000 g | Freq: Once | INTRAVENOUS | Status: DC

## 2021-05-31 MED ORDER — SODIUM CHLORIDE 0.9 % IV SOLN
1.0000 g | Freq: Once | INTRAVENOUS | Status: AC
  Administered 2021-05-31: 1 g via INTRAVENOUS
  Filled 2021-05-31: qty 10

## 2021-05-31 MED ORDER — APIXABAN 5 MG PO TABS
5.0000 mg | ORAL_TABLET | Freq: Two times a day (BID) | ORAL | Status: DC
  Administered 2021-05-31 – 2021-06-04 (×8): 5 mg via ORAL
  Filled 2021-05-31 (×8): qty 1

## 2021-05-31 MED ORDER — TAMSULOSIN HCL 0.4 MG PO CAPS
0.4000 mg | ORAL_CAPSULE | Freq: Every day | ORAL | Status: DC
  Administered 2021-05-31 – 2021-06-03 (×4): 0.4 mg via ORAL
  Filled 2021-05-31 (×4): qty 1

## 2021-05-31 MED ORDER — SENNOSIDES-DOCUSATE SODIUM 8.6-50 MG PO TABS: 1.0000 | ORAL_TABLET | Freq: Every evening | ORAL | Status: DC | PRN

## 2021-05-31 MED ORDER — ONDANSETRON HCL 4 MG/2ML IJ SOLN: 4.0000 mg | Freq: Four times a day (QID) | INTRAMUSCULAR | Status: DC | PRN

## 2021-05-31 MED ORDER — SODIUM CHLORIDE 0.9 % IV SOLN
500.0000 mg | Freq: Once | INTRAVENOUS | Status: AC
  Administered 2021-05-31: 500 mg via INTRAVENOUS
  Filled 2021-05-31: qty 5

## 2021-05-31 MED ORDER — SODIUM CHLORIDE 0.9 % IV SOLN: 500.0000 mg | INTRAVENOUS | Status: DC

## 2021-05-31 NOTE — H&P (Signed)
History and Physical    Cory Hansen [id number] DOB: February 05, 1921 DOA: 05/31/2021  PCP: Hoyt Koch, MD   Patient coming from: Home  Chief Complaint: Cough, SOB, confusion   HPI: Cory Hansen is a very pleasant 88 y.o. male with medical history significant for atrial fibrillation on Eliquis, CKD 3A, chronic diastolic CHF, and dementia, now presenting to the emergency department with increased confusion, productive cough, and shortness of breath.  He is accompanied by his daughter who assists with the history.  Patient lives at home with 24-hour supervision from his family and has been noted to have a cough that is worsened over the past 3 weeks, becoming more productive of thick dark sputum, and associated with worsening shortness of breath and 2 weeks of increased confusion and visual hallucinations.  ED Course: Upon arrival to the ED, patient is found to be afebrile, saturating low 80s on room air, tachypneic, mildly tachycardic, and with stable blood pressure.  EKG features atrial fibrillation.  Head CT negative for acute intracranial abnormality.  Chest x-ray notable for increased peribronchial thickening, patchy opacity in the left midlung, and likely small pleural effusions.  Chemistry panel with stable creatinine and CBC notable for leukocytosis 12,200 and a normocytic anemia.  Initial lactic acid is 2.1.  COVID and influenza PCR are negative.  Blood cultures were collected and the patient was treated with Rocephin and azithromycin in the ED.  Review of Systems:  All other systems reviewed and apart from HPI, are negative.  Past Medical History:  Diagnosis Date   Arthritis    Atrial fibrillation ([place])    Kidney stones    Shingles     Past Surgical History:  Procedure Laterality Date   AMPUTATION Left 02/22/2014   Procedure: INCISION AND DRAINAGE LEFT FOREFOOT AMPUTATION FIRST RAY;  Surgeon: Wylene Simmer, MD;  Location: [facility];  Service: Orthopedics;  Laterality:  Left;   APPENDECTOMY     ERCP N/A 10/25/2020   Procedure: ENDOSCOPIC RETROGRADE CHOLANGIOPANCREATOGRAPHY (ERCP);  Surgeon: Clarene Essex, MD;  Location: [facility];  Service: Endoscopy;  Laterality: N/A;   EYE SURGERY Bilateral    cataract surgery w/ lens implant   HERNIA REPAIR     inguinal hernia repair   REMOVAL OF STONES  10/25/2020   Procedure: REMOVAL OF STONES;  Surgeon: Clarene Essex, MD;  Location: [facility];  Service: Endoscopy;;   SPHINCTEROTOMY  10/25/2020   Procedure: Joan Mayans;  Surgeon: Clarene Essex, MD;  Location: [facility];  Service: Endoscopy;;    Social History:   reports that he has never smoked. He has never used smokeless tobacco. He reports current alcohol use. He reports that he does not use drugs.  No Known Allergies  Family History  Problem Relation Age of Onset   Heart disease Neg Hx        No heart disease in parents     Prior to Admission medications   Medication Sig Start Date End Date Taking? Authorizing Provider  ELIQUIS 5 MG TABS tablet TAKE 1 TABLET BY MOUTH  TWICE DAILY 12/08/20   Hoyt Koch, MD  fluticasone [facility]) 50 MCG/ACT nasal spray Place 1 spray into both nostrils daily as needed for allergies. Patient not taking: Reported on 05/31/2021    [provider]  furosemide (LASIX) 20 MG tablet Take 20 mg by mouth.    [provider]  Glucosamine HCl (GLUCOSAMINE PO) Take by mouth daily.    [provider]  metoprolol tartrate (LOPRESSOR) 25 MG  tablet TAKE ONE-HALF TABLET BY  MOUTH TWICE DAILY Patient not taking: Reported on 05/31/2021 12/07/20   Hoyt Koch, MD  Misc Natural Products (GLUCOSAMINE-CHONDROITIN PLUS PO) Take 1 tablet by mouth daily.    [provider]  pantoprazole (PROTONIX) 40 MG tablet TAKE 1 TABLET BY MOUTH  DAILY 01/17/21   Hoyt Koch, MD  sennosides-docusate sodium (SENOKOT-S) 8.6-50 MG tablet Take 1 tablet by mouth daily. Patient not taking: Reported on  05/31/2021    [provider]  tamsulosin (FLOMAX) 0.4 MG CAPS capsule Take 0.4 mg by mouth at bedtime. 10/12/20   [provider]    Physical Exam: Vitals:   05/31/21 1930 05/31/21 2015 05/31/21 2045 05/31/21 2130  BP: 126/77 126/77 108/76 120/82  Pulse: 81 89 86 (!) 106  Resp: (!) 21 15 (!) 25 20  Temp:      TempSrc:      SpO2: 95% 95% (!) 82% 95%  Weight:      Height:        Constitutional: NAD, calm  Eyes: PERTLA, lids and conjunctivae normal ENMT: Mucous membranes are moist. Posterior pharynx clear of any exudate or lesions.   Neck: supple, no masses  Respiratory: Tachypnea, speaking full sentences. Rhonchi on left.  Cardiovascular: Irregularly irregular, rate ~100. Bilateral lower leg edema.  Abdomen: No distension, no tenderness, soft. Bowel sounds active.  Musculoskeletal: no clubbing / cyanosis. No joint deformity upper and lower extremities.   Skin: no significant rashes, lesions, ulcers. Warm, dry, well-perfused. Neurologic: CN 2-12 grossly intact. Moving all extremities. Alert and oriented to person and place, intermittent confusion.  Psychiatric: Very pleasant. Cooperative.    Labs and Imaging on Admission: I have personally reviewed following labs and imaging studies  CBC: Recent Labs  Lab 05/31/21 1814  WBC 12.2*  NEUTROABS 9.2*  HGB 9.4*  HCT 32.5*  MCV 90.5  PLT [id number]   Basic Metabolic Panel: Recent Labs  Lab 05/31/21 1814  NA 138  K 4.6  CL 104  CO2 27  GLUCOSE 93  BUN 20  CREATININE 1.18  CALCIUM 8.8*   GFR: Estimated Creatinine Clearance: 37.2 mL/min (by C-G formula based on SCr of 1.18 mg/dL). Liver Function Tests: Recent Labs  Lab 05/31/21 1814  AST 27  ALT 23  ALKPHOS 89  BILITOT 0.5  PROT 6.8  ALBUMIN 3.6   No results for input(s): LIPASE, AMYLASE in the last 168 hours. No results for input(s): AMMONIA in the last 168 hours. Coagulation Profile: No results for input(s): INR, PROTIME in the last 168  hours. Cardiac Enzymes: No results for input(s): CKTOTAL, CKMB, CKMBINDEX, TROPONINI in the last 168 hours. BNP (last 3 results) Recent Labs    04/16/21 1415  PROBNP 595.0*   HbA1C: No results for input(s): HGBA1C in the last 72 hours. CBG: No results for input(s): GLUCAP in the last 168 hours. Lipid Profile: No results for input(s): CHOL, HDL, LDLCALC, TRIG, CHOLHDL, LDLDIRECT in the last 72 hours. Thyroid Function Tests: No results for input(s): TSH, T4TOTAL, FREET4, T3FREE, THYROIDAB in the last 72 hours. Anemia Panel: No results for input(s): VITAMINB12, FOLATE, FERRITIN, TIBC, IRON, RETICCTPCT in the last 72 hours. Urine analysis:    Component Value Date/Time   COLORURINE YELLOW 05/31/2021 2000   APPEARANCEUR CLEAR 05/31/2021 2000   LABSPEC 1.014 05/31/2021 2000   PHURINE 5.0 05/31/2021 2000   GLUCOSEU NEGATIVE 05/31/2021 2000   GLUCOSEU NEGATIVE 10/31/2017 [place] 05/31/2021 2000   BILIRUBINUR NEGATIVE 05/31/2021  2000   BILIRUBINUR 1+ 05/31/2021 1643   [place] 05/31/2021 2000   PROTEINUR NEGATIVE 05/31/2021 2000   PROTEINUR Positive (A) 05/31/2021 1643   UROBILINOGEN 0.2 05/31/2021 1643   UROBILINOGEN 0.2 10/31/2017 1407   NITRITE NEGATIVE 05/31/2021 2000   NITRITE negative 05/31/2021 1643   LEUKOCYTESUR NEGATIVE 05/31/2021 2000   LEUKOCYTESUR Negative 05/31/2021 1643   Sepsis Labs: @LABRCNTIP (procalcitonin:4,lacticidven:4) ) Recent Results (from the past 240 hour(s))  Resp Panel by RT-PCR (Flu A&B, Covid) Nasopharyngeal Swab     Status: None   Collection Time: 05/31/21  6:28 PM   Specimen: Nasopharyngeal Swab; Nasopharyngeal(NP) swabs in vial transport medium  Result Value Ref Range Status   SARS Coronavirus 2 by RT PCR NEGATIVE NEGATIVE Final    Comment: (NOTE) SARS-CoV-2 target nucleic acids are NOT DETECTED.  The SARS-CoV-2 RNA is generally detectable in upper respiratory specimens during the acute phase of infection. The  lowest concentration of SARS-CoV-2 viral copies this assay can detect is 138 copies/mL. A negative result does not preclude SARS-Cov-2 infection and should not be used as the sole basis for treatment or other patient management decisions. A negative result may occur with  improper specimen collection/handling, submission of specimen other than nasopharyngeal swab, presence of viral mutation(s) within the areas targeted by this assay, and inadequate number of viral copies(<138 copies/mL). A negative result must be combined with clinical observations, patient history, and epidemiological information. The expected result is Negative.  Fact Sheet for Patients:  EntrepreneurPulse.com.au  Fact Sheet for Healthcare Providers:  IncredibleEmployment.be  This test is no t yet approved or cleared by the [place] FDA and  has been authorized for detection and/or diagnosis of SARS-CoV-2 by FDA under an Emergency Use Authorization (EUA). This EUA will remain  in effect (meaning this test can be used) for the duration of the COVID-19 declaration under Section 564(b)(1) of the Act, 21 U.S.C.section 360bbb-3(b)(1), unless the authorization is terminated  or revoked sooner.       Influenza A by PCR NEGATIVE NEGATIVE Final   Influenza B by PCR NEGATIVE NEGATIVE Final    Comment: (NOTE) The Xpert Xpress SARS-CoV-2/FLU/RSV plus assay is intended as an aid in the diagnosis of influenza from Nasopharyngeal swab specimens and should not be used as a sole basis for treatment. Nasal washings and aspirates are unacceptable for Xpert Xpress SARS-CoV-2/FLU/RSV testing.  Fact Sheet for Patients: EntrepreneurPulse.com.au  Fact Sheet for Healthcare Providers: IncredibleEmployment.be  This test is not yet approved or cleared by the [place] FDA and has been authorized for detection and/or diagnosis of SARS-CoV-2 by FDA under  an Emergency Use Authorization (EUA). This EUA will remain in effect (meaning this test can be used) for the duration of the COVID-19 declaration under Section 564(b)(1) of the Act, 21 U.S.C. section 360bbb-3(b)(1), unless the authorization is terminated or revoked.  Performed at [facility], [place] [address], [place], [place] [address]      Radiological Exams on Admission: CT HEAD WO CONTRAST (5MM)  Result Date: 05/31/2021 CLINICAL DATA:  Altered mental status. EXAM: CT HEAD WITHOUT CONTRAST TECHNIQUE: Contiguous axial images were obtained from the base of the skull through the vertex without intravenous contrast. RADIATION DOSE REDUCTION: This exam was performed according to the departmental dose-optimization program which includes automated exposure control, adjustment of the mA and/or kV according to patient size and/or use of iterative reconstruction technique. COMPARISON:  Head CT dated 06/30/2020. FINDINGS: Brain: Mild age-related atrophy and chronic microvascular ischemic changes. There is  no acute intracranial hemorrhage. No mass effect or midline shift. No extra-axial fluid collection. Vascular: No hyperdense vessel or unexpected calcification. Skull: Normal. Negative for fracture or focal lesion. Sinuses/Orbits: Diffuse mucoperiosteal thickening of paranasal sinuses with opacification of several ethmoid air cells as well as partial opacification of the right maxillary sinus. No air-fluid level. The mastoid air cells are clear. Other: None IMPRESSION: 1. No acute intracranial pathology. 2. Mild age-related atrophy and chronic microvascular ischemic changes. 3. Chronic paranasal sinus disease. Electronically Signed   By: Anner Crete M.D.   On: 05/31/2021 19:12   DG Chest Port 1 View  Result Date: 05/31/2021 CLINICAL DATA:  Shortness of breath.  Productive cough. EXAM: PORTABLE CHEST 1 VIEW COMPARISON:  Most recent radiograph 04/02/2021. FINDINGS: Lower lung volumes  from prior exam. Stable heart size and mediastinal contours. Aortic atherosclerosis. Diffuse bilateral calcified pleural plaques. Suspected small bilateral pleural effusions. Increased peribronchial thickening. No pneumothorax. There may be patchy opacity in the left mid lung. IMPRESSION: 1. Lower lung volumes from prior exam. Increased peribronchial thickening may be bronchitis or pulmonary edema. 2. Patchy opacity in the left mid lung may represent pneumonia in the appropriate clinical setting. 3. Suspected small bilateral pleural effusions. 4. Chronic calcified pleural plaques. Electronically Signed   By: Keith Rake M.D.   On: 05/31/2021 19:03    EKG: Independently reviewed. Atrial fibrillation.   Assessment/Plan   1. Sepsis secondary to pneumonia; acute hypoxic respiratory failure  - Presents with worsening productive cough and SOB and found to have pneumonia with qSOFA score of 2 and multiple SIRS criteria  - Oxygen saturations low 80s on rm air in ED  - Blood cultures were collected in ED and Rocephin and azithromycin were started  - Culture sputum, check strep pneumo and legionella antigens, trend procalcitonin, continue Rocephin and azithromycin, continue supplemental O2 as needed    2. Acute encephalopathy  - Family noticed increased confusion and hallucinations in the past 2 weeks on background of dementia  - No acute findings on head CT, likely from his pneumonia  - Treat pneumonia, use delirium precautions   3. Atrial fibrillation  - CHADS-VASc at least 3 (age x2, CHF)  - Continue Eliquis; per daughter, no longer on metoprolol d/t hypotension   4. CKD IIIa  - SCr is 1.18 on admission, similar to priors  - Renally-dose medications and monitor    5. Anemia  - Hgb is 9.4 on admission  - No overt bleeding, Hgb increased from last month   6. Chronic diastolic CHF  - EF was preserved on TTE in March 2022  - Appears compensated  - BNP and repeat lactate pending, will hold  Lasix for now    DVT prophylaxis: Eliquis  Code Status: DNR, paperwork on file and confirmed with daughter in ED  Level of Care: Level of care: Med-Surg Family Communication: Daughters at bedside and by phone  Disposition Plan:  Patient is from: Home  Anticipated d/c is to: TBD Anticipated d/c date is: 06/03/21 Patient currently: Pending improved respiratory status and conversion to oral medications  Consults called: None  Admission status: Inpatient. PORT score is 128, reflecting his high-risk and inpatient management indicated for this reason.    Vianne Bulls, MD [facility]  05/31/2021, 10:41 PM

## 2021-05-31 NOTE — Progress Notes (Signed)
Subjective:  Patient ID: Cory Hansen, male    DOB: March 14, 1921  Age: 88 y.o. MRN: [id number]  CC:  Chief Complaint  Patient presents with   Urinary Frequency    Since 2/20 and states it is getting worse.   Cough    Productive cough with wheezing. States he has flare ups every once in a while.   Altered Mental Status    States he has been having hallucinations and has been getting confused a lot. This has started with the urination (2/20)   Urinary Incontinence    2/20      HPI  This patient arrives today for the above. He is accompanied by both of his daughters. They stay with him around the clock. Their main concern is that the patient has been experiencing confusion, weakness, and hallucinations over the last two weeks. He has been coughing and has also been experiencing urinary incontinence. All of these symptoms are new and not his baseline per his daughters.  They do not recall the patient has had fever, but they do report a few days ago the patient complained of being very cold.  He has not been tested for COVID-19.    Past Medical History:  Diagnosis Date   Arthritis    Atrial fibrillation ([facility])    Kidney stones    Shingles       Family History  Problem Relation Age of Onset   Heart disease Neg Hx        No heart disease in parents    Social History   Social History Narrative   Not on file   Social History   Tobacco Use   Smoking status: Never   Smokeless tobacco: Never  Substance Use Topics   Alcohol use: Yes    Alcohol/week: 0.0 standard drinks    Comment: very rare     Current Meds  Medication Sig   ELIQUIS 5 MG TABS tablet TAKE 1 TABLET BY MOUTH  TWICE DAILY   furosemide (LASIX) 20 MG tablet Take 20 mg by mouth.   Glucosamine HCl (GLUCOSAMINE PO) Take by mouth daily.   Misc Natural Products (GLUCOSAMINE-CHONDROITIN PLUS PO) Take 1 tablet by mouth daily.   pantoprazole (PROTONIX) 40 MG tablet TAKE 1 TABLET BY MOUTH  DAILY   tamsulosin  (FLOMAX) 0.4 MG CAPS capsule Take 0.4 mg by mouth at bedtime.    ROS:  Review of Systems  Constitutional:  Positive for chills. Negative for fever.  Respiratory:  Positive for cough and wheezing. Negative for shortness of breath.   Cardiovascular:  Negative for chest pain.  Genitourinary:  Positive for frequency.       (+) incontinence  Neurological:  Positive for weakness.       (+) confusion    Objective:   Today's Vitals: BP 138/70 (BP Location: Left Arm, Patient Position: Sitting, Cuff Size: Normal)    Pulse (!) 105    Temp 98.4 F (36.9 C) (Oral)    Ht 5\' 11"  (1.803 m)    SpO2 99%    BMI 24.69 kg/m  Vitals with BMI 05/31/2021 05/31/2021 05/31/2021  Height 5\' 11"  - 5\' 11"   Weight 172 lbs - -  BMI 24 - -  Systolic - [id number] 0000000  Diastolic - 79 70  Pulse - 95 105     Physical Exam Vitals reviewed.  Constitutional:      Appearance: Normal appearance. He is diaphoretic.  HENT:     Head: Normocephalic  and atraumatic.  Cardiovascular:     Rate and Rhythm: Normal rate. Rhythm irregular.  Pulmonary:     Effort: Pulmonary effort is normal.     Breath sounds: Examination of the left-lower field reveals rhonchi. Rhonchi present.  Musculoskeletal:     Cervical back: Neck supple.  Skin:    General: Skin is warm.  Neurological:     Mental Status: He is alert and oriented to person, place, and time.  Psychiatric:        Mood and Affect: Mood normal.        Behavior: Behavior normal.        Thought Content: Thought content normal.        Judgment: Judgment normal.    Poc u/a: Negative for leukocytes, nitrites, and is cloudy     Assessment and Plan   1. Rales   2. Frequent urination   3. Urinary incontinence, unspecified type   4. Confusion      Plan: 1., 4.  Patient does not appear to be in respiratory failure, however he does have clinical signs of pneumonia.  In addition he is having mental status changes and heart rate is above 100.  He does have history of atrial  fibrillation.  He is also diaphoretic on exam but is afebrile.  I am concerned he may have community-acquired pneumonia and due to his mental status changes, elevated heart rate, and age he may require inpatient treatment.  I recommended patient proceed to the emergency department for further evaluation and treatment.  Family member is in agreements to this.  They feel comfortable driving him to the hospital and will proceed in that direction. 2.,  3.  UA negative today.  I was planning on sending urine for urine culture, however patient will proceed to the hospital and he can undergo further testing there if needed.   Tests ordered Orders Placed This Encounter  Procedures   POCT urinalysis dipstick      No orders of the defined types were placed in this encounter.   Patient to follow-up as scheduled or as needed.  Ailene Ards, NP

## 2021-05-31 NOTE — ED Provider Notes (Signed)
[facility] DEPT Provider Note   CSN: [id number] Arrival date & time: 05/31/21  1731     History  Chief Complaint  Patient presents with   Cough   R/O Pneumonia    Cory Hansen is a 88 y.o. male.  88 yo gentleman presents for concerns of visual hallucinations, urinary frequency, and worsening cough with new purulence. Patient has two daughters that provide round the clock care. He has started to have visual hallucinations (thinks that people are rearranging his furniture) in the last several days. He also reports increased urination in the same time frame. He has a long standing cough, but it has become worse in the last several days, now with green phlegm. Patient has a history of a fib on eliquis and metoprolol. His daughter is not aware of any fever, but he had chills 2 days ago. He saw his PCP earlier today and was sent here for concern of pneumonia. He also has bilateral redness and some green discharge around both eyes. Patient is blind at baseline, does not use any eye drops. Daughter has not noticed this finding.     Home Medications Prior to Admission medications   Medication Sig Start Date End Date Taking? Authorizing Provider  ELIQUIS 5 MG TABS tablet TAKE 1 TABLET BY MOUTH  TWICE DAILY 12/08/20   Hoyt Koch, MD  fluticasone [facility]) 50 MCG/ACT nasal spray Place 1 spray into both nostrils daily as needed for allergies. Patient not taking: Reported on 05/31/2021    [provider]  furosemide (LASIX) 20 MG tablet Take 20 mg by mouth.    [provider]  Glucosamine HCl (GLUCOSAMINE PO) Take by mouth daily.    [provider]  metoprolol tartrate (LOPRESSOR) 25 MG tablet TAKE ONE-HALF TABLET BY  MOUTH TWICE DAILY Patient not taking: Reported on 05/31/2021 12/07/20   Hoyt Koch, MD  Misc Natural Products (GLUCOSAMINE-CHONDROITIN PLUS PO) Take 1 tablet by mouth daily.    [provider]   pantoprazole (PROTONIX) 40 MG tablet TAKE 1 TABLET BY MOUTH  DAILY 01/17/21   Hoyt Koch, MD  sennosides-docusate sodium (SENOKOT-S) 8.6-50 MG tablet Take 1 tablet by mouth daily. Patient not taking: Reported on 05/31/2021    [provider]  tamsulosin (FLOMAX) 0.4 MG CAPS capsule Take 0.4 mg by mouth at bedtime. 10/12/20   [provider]      Allergies    Patient has no known allergies.    Review of Systems   Review of Systems  Constitutional:  Positive for chills. Negative for appetite change and fever.  HENT:  Negative for rhinorrhea, sinus pressure, sneezing and sore throat.   Eyes:  Positive for discharge and redness.  Respiratory:  Positive for cough. Negative for shortness of breath and wheezing.   Cardiovascular:  Positive for leg swelling. Negative for chest pain and palpitations.  Gastrointestinal:  Negative for abdominal pain, constipation, diarrhea, nausea and vomiting.  Genitourinary:  Positive for frequency and urgency. Negative for difficulty urinating and dysuria.  Neurological:  Negative for dizziness, seizures, weakness, light-headedness and headaches.  Psychiatric/Behavioral:  Positive for hallucinations.    Physical Exam Updated Vital Signs BP 120/82    Pulse (!) 106    Temp 98.4 F (36.9 C) (Oral)    Resp 20    Ht 5\' 11"  (1.803 m)    Wt 78 kg    SpO2 95%    BMI 23.99 kg/m  Physical Exam Vitals and nursing  note reviewed.  Constitutional:      General: He is not in acute distress.    Appearance: Normal appearance. He is ill-appearing. He is not toxic-appearing or diaphoretic.     Comments: Frail, elderly man, ill-appearing, in NAD, resting comfortably on bed  HENT:     Head: Normocephalic and atraumatic.     Nose: Rhinorrhea present.     Mouth/Throat:     Mouth: Mucous membranes are moist.     Pharynx: Oropharynx is clear. No oropharyngeal exudate or posterior oropharyngeal erythema.  Eyes:     General: No scleral icterus.        Right eye: Discharge present.        Left eye: Discharge present.    Pupils: Pupils are equal, round, and reactive to light.     Comments: R eye conjunctival injection  Cardiovascular:     Rate and Rhythm: Normal rate. Rhythm irregular.     Pulses: Normal pulses.     Heart sounds: Normal heart sounds.  Pulmonary:     Effort: Pulmonary effort is normal. No respiratory distress.     Breath sounds: Rhonchi present. No wheezing or rales.  Abdominal:     General: Abdomen is flat. Bowel sounds are normal. There is no distension.     Palpations: Abdomen is soft.     Tenderness: There is no abdominal tenderness. There is no guarding.  Musculoskeletal:     Cervical back: Neck supple.     Right lower leg: Edema present.     Left lower leg: Edema present.     Comments: 1+ pitting edema in b/l ankles to shins  Lymphadenopathy:     Cervical: No cervical adenopathy.  Skin:    Capillary Refill: Capillary refill takes less than 2 seconds.  Neurological:     Mental Status: He is alert. He is disoriented.     Cranial Nerves: No cranial nerve deficit.     Sensory: No sensory deficit.     Motor: No weakness.     Deep Tendon Reflexes: Reflexes normal.     Comments: Alert, oriented to self and place.   Psychiatric:        Mood and Affect: Mood normal.        Behavior: Behavior normal.     Comments: Visual hallucinations    ED Results / Procedures / Treatments   Labs (all labs ordered are listed, but only abnormal results are displayed) Labs Reviewed  CBC WITH DIFFERENTIAL/PLATELET - Abnormal; Notable for the following components:      Result Value   WBC 12.2 (*)    RBC 3.59 (*)    Hemoglobin 9.4 (*)    HCT 32.5 (*)    MCHC 28.9 (*)    RDW 20.3 (*)    Neutro Abs 9.2 (*)    Monocytes Absolute 1.2 (*)    All other components within normal limits  COMPREHENSIVE METABOLIC PANEL - Abnormal; Notable for the following components:   Calcium 8.8 (*)    GFR, Estimated 56 (*)    All other  components within normal limits  LACTIC ACID, PLASMA - Abnormal; Notable for the following components:   Lactic Acid, Venous 2.1 (*)    All other components within normal limits  BLOOD GAS, VENOUS - Abnormal; Notable for the following components:   pO2, Ven <31 (*)    All other components within normal limits  RESP PANEL BY RT-PCR (FLU A&B, COVID) ARPGX2  URINE CULTURE  CULTURE, BLOOD (ROUTINE X  2)  CULTURE, BLOOD (ROUTINE X 2)  URINALYSIS, ROUTINE W REFLEX MICROSCOPIC  LACTIC ACID, PLASMA  BRAIN NATRIURETIC PEPTIDE  PROCALCITONIN  PROCALCITONIN    EKG None  Radiology CT HEAD WO CONTRAST (5MM)  Result Date: 05/31/2021 CLINICAL DATA:  Altered mental status. EXAM: CT HEAD WITHOUT CONTRAST TECHNIQUE: Contiguous axial images were obtained from the base of the skull through the vertex without intravenous contrast. RADIATION DOSE REDUCTION: This exam was performed according to the departmental dose-optimization program which includes automated exposure control, adjustment of the mA and/or kV according to patient size and/or use of iterative reconstruction technique. COMPARISON:  Head CT dated 06/30/2020. FINDINGS: Brain: Mild age-related atrophy and chronic microvascular ischemic changes. There is no acute intracranial hemorrhage. No mass effect or midline shift. No extra-axial fluid collection. Vascular: No hyperdense vessel or unexpected calcification. Skull: Normal. Negative for fracture or focal lesion. Sinuses/Orbits: Diffuse mucoperiosteal thickening of paranasal sinuses with opacification of several ethmoid air cells as well as partial opacification of the right maxillary sinus. No air-fluid level. The mastoid air cells are clear. Other: None IMPRESSION: 1. No acute intracranial pathology. 2. Mild age-related atrophy and chronic microvascular ischemic changes. 3. Chronic paranasal sinus disease. Electronically Signed   By: Anner Crete M.D.   On: 05/31/2021 19:12   DG Chest Port 1  View  Result Date: 05/31/2021 CLINICAL DATA:  Shortness of breath.  Productive cough. EXAM: PORTABLE CHEST 1 VIEW COMPARISON:  Most recent radiograph 04/02/2021. FINDINGS: Lower lung volumes from prior exam. Stable heart size and mediastinal contours. Aortic atherosclerosis. Diffuse bilateral calcified pleural plaques. Suspected small bilateral pleural effusions. Increased peribronchial thickening. No pneumothorax. There may be patchy opacity in the left mid lung. IMPRESSION: 1. Lower lung volumes from prior exam. Increased peribronchial thickening may be bronchitis or pulmonary edema. 2. Patchy opacity in the left mid lung may represent pneumonia in the appropriate clinical setting. 3. Suspected small bilateral pleural effusions. 4. Chronic calcified pleural plaques. Electronically Signed   By: Keith Rake M.D.   On: 05/31/2021 19:03    Procedures Procedures    Medications Ordered in ED Medications  sodium chloride 0.9 % bolus 500 mL (has no administration in time range)  cefTRIAXone (ROCEPHIN) 1 g in sodium chloride 0.9 % 100 mL IVPB (1 g Intravenous New Bag/Given 05/31/21 1956)  azithromycin (ZITHROMAX) 500 mg in sodium chloride 0.9 % 250 mL IVPB (500 mg Intravenous New Bag/Given 05/31/21 2001)    ED Course/ Medical Decision Making/ A&P Clinical Course as of 05/31/21 2214  Thu May 31, 2021  1937 CXR shows likely pna with small b/l pleural effusions. Will treat for CAP. CT head with no acute findings. Will await lab work. Patient's last echo in March 2022 showed normal EF 50-55%; will obtain BNP as well. He does have peripheral edema, no JVD. PNA is leading dx over CHF at this time. [CM]  2050 UA clear. VBG shows normal pCO2. CBC with mild leukocytosis. Hgb low to 9.4, but siginificant improvement from 7.5 a month ago (baseline about 10-11).  [CM]  2100 LA of 2.1. Will give gentle IVF. [CM]  2137 Now hypoxic to 82%, CMP returned with stable s cr and no electrolyte abnormalities. Mild sepsis  from pneumonia, will call medicine for admission. [CM]  2211 Re-evaluated patient, SpO2 dropped to 80s with good pleth. Placed [place] with 2L O2 and SpO2 improved to mid 90s. Discussed with [facility] hospitalist who will admit. [CM]    Clinical Course User Index [CM] Gladys Damme,  MD                           Medical Decision Making 88 yo man presents with several days of cough, new visual hallucinations and confusion, as well as urinary urgency and frequency. Patient is in no distress, but has significant rhonchi in all lung fields. No history of trauma, but will obtain head CT due to new visual hallucinations. Pneumonia is most likely source of altered mental status, will obtain CXR and obtain sepsis work up due to [place]. No known hepatologic disease, however will look for other common causes of AMS. Will obtain urine and blood cultures.   Amount and/or Complexity of Data Reviewed Independent Historian: caregiver    Details: daughter External Data Reviewed: labs and notes.    Details: UA negative Labs: ordered. Decision-making details documented in ED Course. Radiology: ordered and independent interpretation performed. ECG/medicine tests: ordered.  Risk Decision regarding hospitalization.          Final Clinical Impression(s) / ED Diagnoses Final diagnoses:  None    Rx / DC Orders ED Discharge Orders     None         Gladys Damme, MD 05/31/21 2214    Drenda Freeze, MD 05/31/21 2329

## 2021-05-31 NOTE — ED Triage Notes (Signed)
Pt sent from PCP office to r/o pneumonia, pt has productive cough with greenish secretions,

## 2021-06-01 DIAGNOSIS — J9601 Acute respiratory failure with hypoxia: Secondary | ICD-10-CM

## 2021-06-01 HISTORY — DX: Acute respiratory failure with hypoxia: J96.01

## 2021-06-01 LAB — STREP PNEUMONIAE URINARY ANTIGEN: Strep Pneumo Urinary Antigen: NEGATIVE

## 2021-06-01 LAB — CBC
HCT: 30.4 % — ABNORMAL LOW (ref 39.0–52.0)
Hemoglobin: 8.9 g/dL — ABNORMAL LOW (ref 13.0–17.0)
MCH: 26.2 pg (ref 26.0–34.0)
MCHC: 29.3 g/dL — ABNORMAL LOW (ref 30.0–36.0)
MCV: 89.4 fL (ref 80.0–100.0)
Platelets: 240 10*3/uL (ref 150–400)
RBC: 3.4 MIL/uL — ABNORMAL LOW (ref 4.22–5.81)
RDW: 20.2 % — ABNORMAL HIGH (ref 11.5–15.5)
WBC: 9.6 10*3/uL (ref 4.0–10.5)
nRBC: 0 % (ref 0.0–0.2)

## 2021-06-01 LAB — BASIC METABOLIC PANEL
Anion gap: 9 (ref 5–15)
BUN: 18 mg/dL (ref 8–23)
CO2: 24 mmol/L (ref 22–32)
Calcium: 8.6 mg/dL — ABNORMAL LOW (ref 8.9–10.3)
Chloride: 107 mmol/L (ref 98–111)
Creatinine, Ser: 1 mg/dL (ref 0.61–1.24)
GFR, Estimated: >60 mL/min (ref >60)
Glucose, Bld: 94 mg/dL (ref 70–99)
Potassium: 4.1 mmol/L (ref 3.5–5.1)
Sodium: 140 mmol/L (ref 135–145)

## 2021-06-01 LAB — PROCALCITONIN: Procalcitonin: 0.22 ng/mL

## 2021-06-01 MED ORDER — AZITHROMYCIN 250 MG PO TABS
500.0000 mg | ORAL_TABLET | Freq: Every day | ORAL | Status: DC
  Administered 2021-06-01 – 2021-06-03 (×3): 500 mg via ORAL
  Filled 2021-06-01 (×3): qty 2

## 2021-06-01 MED ORDER — ALBUTEROL SULFATE (2.5 MG/3ML) 0.083% IN NEBU
2.5000 mg | INHALATION_SOLUTION | Freq: Two times a day (BID) | RESPIRATORY_TRACT | Status: DC
  Administered 2021-06-01 – 2021-06-04 (×7): 2.5 mg via RESPIRATORY_TRACT
  Filled 2021-06-01 (×7): qty 3

## 2021-06-01 MED ORDER — ENSURE ENLIVE PO LIQD
237.0000 mL | Freq: Two times a day (BID) | ORAL | Status: DC
  Administered 2021-06-01 – 2021-06-03 (×5): 237 mL via ORAL

## 2021-06-01 MED ORDER — QUETIAPINE FUMARATE 25 MG PO TABS
12.5000 mg | ORAL_TABLET | Freq: Every day | ORAL | Status: DC
  Administered 2021-06-01 – 2021-06-03 (×3): 12.5 mg via ORAL
  Filled 2021-06-01 (×3): qty 1

## 2021-06-01 MED ORDER — MELATONIN 3 MG PO TABS
3.0000 mg | ORAL_TABLET | Freq: Every day | ORAL | Status: DC
  Administered 2021-06-01 – 2021-06-03 (×3): 3 mg via ORAL
  Filled 2021-06-01 (×3): qty 1

## 2021-06-01 MED ORDER — DOCUSATE SODIUM 100 MG PO CAPS
100.0000 mg | ORAL_CAPSULE | Freq: Every day | ORAL | Status: DC
  Administered 2021-06-01 – 2021-06-03 (×3): 100 mg via ORAL
  Filled 2021-06-01 (×3): qty 1

## 2021-06-01 NOTE — Assessment & Plan Note (Addendum)
Sepsis due to pneumonia-community-acquired Acute hypoxic respiratory failure: Hypoxic in 80s on RA in the ED and met sepsis criteria with leukocytosis tachypnea. Clinically improved off oxygen leukocytosis resolved.  Culture data unyielding. Treated with ceftriaxone azithromycin-we will change to oral antibiotics on discharge.Repeat chest x-ray in 4 weeks and follow-up with PCP as outpatient Recent Labs  Lab 05/31/21 1814 05/31/21 1842 05/31/21 2230 05/31/21 2300 06/01/21 0624 06/02/21 0600  WBC 12.2*  --   --   --  9.6  --   LATICACIDVEN  --  2.1* 1.7  --   --   --   PROCALCITON  --   --   --  0.30 0.22 0.20

## 2021-06-01 NOTE — Progress Notes (Signed)
PHARMACIST - PHYSICIAN COMMUNICATION  CONCERNING: Antibiotic IV to Oral Route Change Policy  RECOMMENDATION: This patient is receiving azithromycin by the intravenous route.  Based on criteria approved by the Pharmacy and Therapeutics Committee, the antibiotic(s) is/are being converted to the equivalent oral dose form(s).   DESCRIPTION: These criteria include:  Patient being treated for a respiratory tract infection, urinary tract infection, cellulitis or clostridium difficile associated diarrhea if on metronidazole  The patient is not neutropenic and does not exhibit a GI malabsorption state  The patient is eating (either orally or via tube) and/or has been taking other orally administered medications for a least 24 hours  The patient is improving clinically and has a Tmax < 100.5  If you have questions about this conversion, please contact the Pharmacy Department  []  ( [phone] )  [place] []  ( [phone] )  [facility] []  ( [phone] )  [facility] []  ( [phone] )  [facility] [x]  ( [phone] )   [facility]  

## 2021-06-01 NOTE — Assessment & Plan Note (Addendum)
Acute Metabolic encephalopathy with dementia At this time fairly stable mental status.  Baseline hearing difficulties and memory problems patient's family member taking down to help with this memory issues during the night which seems to have helped.  Is responding well to low-dose Seroquel and melatonin during night.

## 2021-06-01 NOTE — Hospital Course (Addendum)
88 y.o. male with medical history significant for atrial fibrillation on Eliquis, CKD 3A, chronic diastolic CHF, and dementia, now presenting to the emergency department accompanied by daughter who is assisting with history, patient with increased confusion, productive cough, and shortness of breath. He lives at home with 24-hour supervision from his family and has been noted to have a cough that is worsened over the past 3 weeks, becoming more productive of thick dark sputum, and associated with worsening shortness of breath and 2 weeks of increased confusion and visual hallucinations. In the ED vitals stable saturating 80s on room air tachypneic chest x-ray with no acute peribronchial thickening and patchy opacity in the left midlung likely small pleural effusion Labs showed leukocytosis initial lactic acid 2.1 COVID-19 PCR and influenza negative blood cultures collected placed on IV antibiotics and admitted For sepsis POA due to pneumonia/acute hypoxic aspiratory failure, acute metabolic encephalopathy in the setting of dementia, A-fib CKD anemia-chronic diastolic CHF. Seen by physical therapy Occupational Therapy and has recommended skilled nursing facility, family wanting placement as well. At this time patient is clinically improving, off oxygen having bowel movement, mental status is stable.Awaiting on placement.  Patient remains medically stable

## 2021-06-01 NOTE — Progress Notes (Signed)
PROGRESS NOTE Cory Hansen  VHQ:[id number] DOB: 08/05/1920 DOA: 05/31/2021 PCP: Hoyt Koch, MD   Brief Narrative/Hospital Course: 88 y.o. male with medical history significant for atrial fibrillation on Eliquis, CKD 3A, chronic diastolic CHF, and dementia, now presenting to the emergency department accompanied by daughter who is assisting with history, patient with increased confusion, productive cough, and shortness of breath. He lives at home with 24-hour supervision from his family and has been noted to have a cough that is worsened over the past 3 weeks, becoming more productive of thick dark sputum, and associated with worsening shortness of breath and 2 weeks of increased confusion and visual hallucinations. In the ED vitals stable saturating 80s on room air tachypneic chest x-ray with no acute peribronchial thickening and patchy opacity in the left midlung likely small pleural effusion Labs showed leukocytosis initial lactic acid 2.1 COVID-19 PCR and influenza negative blood cultures collected placed on IV antibiotics and admitted For sepsis POA due to pneumonia/acute hypoxic aspiratory failure, acute metabolic encephalopathy in the setting of dementia, A-fib CKD anemia-chronic diastolic CHF    Subjective: Seen and examined this morning.  Patient's daughter at the bedside.  Patient has visual difficulty at baseline and hard of hearing able to tell me his name-otherwise not able to follow other questions  Assessment and Plan: * Sepsis due to pneumonia [facility])- (present on admission) Sepsis due to pneumonia-community-acquired Acute hypoxic respiratory failure: Hypoxic 80s on room air in the ED, met sepsis criteria with leukocytosis tachypnea.  Off oxygen and doing well this morning, leukocytosis improved Continue with Rocephin and azithromycin,follow-up urinary antigen blood culture sputum culture.  PT OT OOB and I-S and respiratory support Recent Labs  Lab 05/31/21 1814  05/31/21 1842 05/31/21 2230 05/31/21 2300 06/01/21 0624  WBC 12.2*  --   --   --  9.6  LATICACIDVEN  --  2.1* 1.7  --   --   PROCALCITON  --   --   --  0.30 0.22    Acute respiratory failure with hypoxia (HCC) See above  Acute encephalopathy- (present on admission) Acute Metabolic encephalopathy with dementia Alert awake oriented x1, now appears to be close to baseline.  But having significant insomnia Cont fall precaution supportive care he has 24-hour caregiver at home, with recent worsening confusion.  Add low-dose Seroquel for the night  Stage 3a chronic kidney disease (CKD) ([place])- (present on admission) Renal function is stable at baseline Recent Labs  Lab 05/31/21 1814 06/01/21 0624  BUN 20 18  CREATININE 1.18 1.00    Normocytic anemia- (present on admission) Monitor hemoglobin 8-9 gm. Recent Labs  Lab 05/31/21 1814 06/01/21 0624  HGB 9.4* 8.9*  HCT 32.5* 30.4*    Chronic diastolic CHF (congestive heart failure) ([place])- (present on admission) Last echo in March 2020, euvolemic.  Holding Lasix due to lactic acidosis and elevated BNP on admission  Permanent atrial fibrillation ([place])- (present on admission) On Eliquis, patient no longer on metoprolol due to hypotension.  Monitor    * Sepsis due to pneumonia [facility])- (present on admission) Sepsis due to pneumonia-community-acquired Acute hypoxic respiratory failure: Hypoxic 80s on room air in the ED, met sepsis criteria with leukocytosis tachypnea.  Off oxygen and doing well this morning, leukocytosis improved Continue with Rocephin and azithromycin,follow-up urinary antigen blood culture sputum culture.  PT OT OOB and I-S and respiratory support Recent Labs  Lab 05/31/21 1814 05/31/21 1842 05/31/21 2230 05/31/21 2300 06/01/21 0624  WBC 12.2*  --   --   --  9.6  LATICACIDVEN  --  2.1* 1.7  --   --   PROCALCITON  --   --   --  0.30 0.22    Acute respiratory failure with hypoxia (HCC) See above  Acute  encephalopathy- (present on admission) Acute Metabolic encephalopathy with dementia Alert awake oriented x1, now appears to be close to baseline.  But having significant insomnia Cont fall precaution supportive care he has 24-hour caregiver at home, with recent worsening confusion.  Add low-dose Seroquel for the night  Stage 3a chronic kidney disease (CKD) ([place])- (present on admission) Renal function is stable at baseline Recent Labs  Lab 05/31/21 1814 06/01/21 0624  BUN 20 18  CREATININE 1.18 1.00    Normocytic anemia- (present on admission) Monitor hemoglobin 8-9 gm. Recent Labs  Lab 05/31/21 1814 06/01/21 0624  HGB 9.4* 8.9*  HCT 32.5* 30.4*    Chronic diastolic CHF (congestive heart failure) ([place])- (present on admission) Last echo in March 2020, euvolemic.  Holding Lasix due to lactic acidosis and elevated BNP on admission  Permanent atrial fibrillation ([place])- (present on admission) On Eliquis, patient no longer on metoprolol due to hypotension.  Monitor   DVT prophylaxis: Eliquis Code Status:   Code Status: DNR Family Communication: plan of care discussed with patient/and daughter at bedside.  Disposition: Currently not medically stable for discharge. Status is: Inpatient Remains inpatient appropriate because: For ongoing management of pneumonia, deconditioning and PT OT evaluation and likely need skilled nursing facility. Objective: Vitals last 24 hrs: Vitals:   05/31/21 2130 05/31/21 2306 06/01/21 0707 06/01/21 0757  BP: 120/82 135/79 (!) 133/95   Pulse: (!) 106 (!) 107 96   Resp: '20 20 18   ' Temp:  98.4 F (36.9 C) 97.7 F (36.5 C)   TempSrc:  Oral Oral   SpO2: 95% 96% 96% 94%  Weight:  78.4 kg    Height:       Weight change:   Physical Examination: General exam: AA0x1,older than stated age, weak appearing. HEENT:Oral mucosa moist, Ear/Nose WNL grossly, dentition normal. Respiratory system: bilaterally diminished BS, no use of accessory  muscle Cardiovascular system: S1 & S2 +, No JVD,. Gastrointestinal system: Abdomen soft,NT,ND, BS+ Nervous System:Alert, awake, moving extremities and grossly nonfocal Extremities: LE edema mild,distal peripheral pulses palpable.  Skin: No rashes,no icterus. MSK: Normal muscle bulk,tone, power  Medications reviewed:  Scheduled Meds:  albuterol  2.5 mg Nebulization BID   apixaban  5 mg Oral BID   azithromycin  500 mg Oral QHS   docusate sodium  100 mg Oral QHS   melatonin  3 mg Oral QHS   pantoprazole  40 mg Oral Daily   QUEtiapine  12.5 mg Oral QHS   tamsulosin  0.4 mg Oral QHS   Continuous Infusions:  cefTRIAXone (ROCEPHIN)  IV        Diet Order             Diet Heart Room service appropriate? Yes; Fluid consistency: Thin  Diet effective now                            Intake/Output Summary (Last 24 hours) at 06/01/2021 1104 Last data filed at 06/01/2021 0500 Gross per 24 hour  Intake 358.99 ml  Output 400 ml  Net -41.01 ml   Net IO Since Admission: -41.01 mL [06/01/21 1104]  Wt Readings from Last 3 Encounters:  05/31/21 78.4 kg  04/16/21 80.3 kg  04/02/21 76.7 kg  Unresulted Labs (From admission, onward)     Start     Ordered   06/01/21 0500  Procalcitonin  Daily,   R      05/31/21 2208   06/01/21 5726  Basic metabolic panel  Daily,   R      05/31/21 2218   06/01/21 0500  CBC  Daily,   R      05/31/21 2218   05/31/21 2217  Expectorated Sputum Assessment w Gram Stain, Rflx to Resp Cult  (COPD / Pneumonia / Cellulitis / Lower Extremity Wound)  Once,   R        05/31/21 2218   05/31/21 2217  Legionella Pneumophila Serogp 1 Ur Ag  (COPD / Pneumonia / Cellulitis / Lower Extremity Wound)  Add-on,   AD        05/31/21 2218   05/31/21 1830  Urine Culture  Once,   STAT       Question:  Indication  Answer:  Urgency/frequency   05/31/21 1829          Data Reviewed: I have personally reviewed following labs and imaging studies CBC: Recent Labs  Lab  05/31/21 1814 06/01/21 0624  WBC 12.2* 9.6  NEUTROABS 9.2*  --   HGB 9.4* 8.9*  HCT 32.5* 30.4*  MCV 90.5 89.4  PLT 268 203   Basic Metabolic Panel: Recent Labs  Lab 05/31/21 1814 06/01/21 0624  NA 138 140  K 4.6 4.1  CL 104 107  CO2 27 24  GLUCOSE 93 94  BUN 20 18  CREATININE 1.18 1.00  CALCIUM 8.8* 8.6*   GFR: Estimated Creatinine Clearance: 43.9 mL/min (by C-G formula based on SCr of 1 mg/dL). Liver Function Tests: Recent Labs  Lab 05/31/21 1814  AST 27  ALT 23  ALKPHOS 89  BILITOT 0.5  PROT 6.8  ALBUMIN 3.6   No results for input(s): LIPASE, AMYLASE in the last 168 hours. No results for input(s): AMMONIA in the last 168 hours. Coagulation Profile: No results for input(s): INR, PROTIME in the last 168 hours. Cardiac Enzymes: No results for input(s): CKTOTAL, CKMB, CKMBINDEX, TROPONINI in the last 168 hours. BNP (last 3 results) Recent Labs    04/16/21 1415  PROBNP 595.0*   HbA1C: No results for input(s): HGBA1C in the last 72 hours. CBG: No results for input(s): GLUCAP in the last 168 hours. Lipid Profile: No results for input(s): CHOL, HDL, LDLCALC, TRIG, CHOLHDL, LDLDIRECT in the last 72 hours. Thyroid Function Tests: No results for input(s): TSH, T4TOTAL, FREET4, T3FREE, THYROIDAB in the last 72 hours. Anemia Panel: No results for input(s): VITAMINB12, FOLATE, FERRITIN, TIBC, IRON, RETICCTPCT in the last 72 hours. Sepsis Labs: Recent Labs  Lab 05/31/21 1842 05/31/21 2230 05/31/21 2300 06/01/21 0624  PROCALCITON  --   --  0.30 0.22  LATICACIDVEN 2.1* 1.7  --   --     Recent Results (from the past 240 hour(s))  Resp Panel by RT-PCR (Flu A&B, Covid) Nasopharyngeal Swab     Status: None   Collection Time: 05/31/21  6:28 PM   Specimen: Nasopharyngeal Swab; Nasopharyngeal(NP) swabs in vial transport medium  Result Value Ref Range Status   SARS Coronavirus 2 by RT PCR NEGATIVE NEGATIVE Final    Comment: (NOTE) SARS-CoV-2 target nucleic acids  are NOT DETECTED.  The SARS-CoV-2 RNA is generally detectable in upper respiratory specimens during the acute phase of infection. The lowest concentration of SARS-CoV-2 viral copies this assay can detect is 138 copies/mL. A  negative result does not preclude SARS-Cov-2 infection and should not be used as the sole basis for treatment or other patient management decisions. A negative result may occur with  improper specimen collection/handling, submission of specimen other than nasopharyngeal swab, presence of viral mutation(s) within the areas targeted by this assay, and inadequate number of viral copies(<138 copies/mL). A negative result must be combined with clinical observations, patient history, and epidemiological information. The expected result is Negative.  Fact Sheet for Patients:  EntrepreneurPulse.com.au  Fact Sheet for Healthcare Providers:  IncredibleEmployment.be  This test is no t yet approved or cleared by the [place] FDA and  has been authorized for detection and/or diagnosis of SARS-CoV-2 by FDA under an Emergency Use Authorization (EUA). This EUA will remain  in effect (meaning this test can be used) for the duration of the COVID-19 declaration under Section 564(b)(1) of the Act, 21 U.S.C.section 360bbb-3(b)(1), unless the authorization is terminated  or revoked sooner.       Influenza A by PCR NEGATIVE NEGATIVE Final   Influenza B by PCR NEGATIVE NEGATIVE Final    Comment: (NOTE) The Xpert Xpress SARS-CoV-2/FLU/RSV plus assay is intended as an aid in the diagnosis of influenza from Nasopharyngeal swab specimens and should not be used as a sole basis for treatment. Nasal washings and aspirates are unacceptable for Xpert Xpress SARS-CoV-2/FLU/RSV testing.  Fact Sheet for Patients: EntrepreneurPulse.com.au  Fact Sheet for Healthcare Providers: IncredibleEmployment.be  This test is  not yet approved or cleared by the [place] FDA and has been authorized for detection and/or diagnosis of SARS-CoV-2 by FDA under an Emergency Use Authorization (EUA). This EUA will remain in effect (meaning this test can be used) for the duration of the COVID-19 declaration under Section 564(b)(1) of the Act, 21 U.S.C. section 360bbb-3(b)(1), unless the authorization is terminated or revoked.  Performed at [facility], [place] [address] [place], [place] [address]   Blood culture (routine x 2)     Status: None (Preliminary result)   Collection Time: 05/31/21  8:00 PM   Specimen: Left Antecubital; Blood  Result Value Ref Range Status   Specimen Description   Final    LEFT ANTECUBITAL Performed at [place] [address] [place], [place] [address]    Special Requests   Final    BOTTLES DRAWN AEROBIC AND ANAEROBIC Blood Culture results may not be optimal due to an inadequate volume of blood received in culture bottles Performed at [place] [address], [place], [place] [address]    Culture   Final    NO GROWTH < 12 HOURS Performed at [place] [address], [place], [place] [address]    Report Status PENDING  Incomplete  Blood culture (routine x 2)     Status: None (Preliminary result)   Collection Time: 05/31/21 11:00 PM   Specimen: BLOOD RIGHT ARM  Result Value Ref Range Status   Specimen Description   Final    BLOOD RIGHT ARM Performed at [place] [address], [place], [place] [address]    Special Requests   Final    BOTTLES DRAWN AEROBIC ONLY Blood Culture adequate volume Performed at [facility] [address] [place], [place] [address]    Culture   Final    NO GROWTH < 12 HOURS Performed at [place] [address] [place], [place] [address]    Report Status PENDING  Incomplete  Antimicrobials: Anti-infectives (From  admission, onward)    Start     Dose/Rate Route Frequency Ordered Stop   06/01/21 2200  azithromycin (ZITHROMAX) tablet 500 mg        500 mg Oral Daily at bedtime 06/01/21 5056 06/05/21 2159   06/01/21 1930  cefTRIAXone (ROCEPHIN) 2 g in sodium chloride 0.9 % 100 mL IVPB        2 g 200 mL/hr over 30 Minutes Intravenous Every 24 hours 05/31/21 2218 06/05/21 1929   06/01/21 1930  azithromycin (ZITHROMAX) 500 mg in sodium chloride 0.9 % 250 mL IVPB  Status:  Discontinued        500 mg 250 mL/hr over 60 Minutes Intravenous Every 24 hours 05/31/21 2218 06/01/21 0952   05/31/21 1945  cefTRIAXone (ROCEPHIN) 1 g in sodium chloride 0.9 % 100 mL IVPB  Status:  Discontinued        1 g 200 mL/hr over 30 Minutes Intravenous  Once 05/31/21 1940 05/31/21 2001   05/31/21 1945  azithromycin (ZITHROMAX) 500 mg in sodium chloride 0.9 % 250 mL IVPB  Status:  Discontinued        500 mg 250 mL/hr over 60 Minutes Intravenous  Once 05/31/21 1940 05/31/21 2002   05/31/21 1930  cefTRIAXone (ROCEPHIN) 1 g in sodium chloride 0.9 % 100 mL IVPB        1 g 200 mL/hr over 30 Minutes Intravenous  Once 05/31/21 1923 05/31/21 2227   05/31/21 1930  azithromycin (ZITHROMAX) 500 mg in sodium chloride 0.9 % 250 mL IVPB        500 mg 250 mL/hr over 60 Minutes Intravenous  Once 05/31/21 1923 05/31/21 2226      Culture/Microbiology    Component Value Date/Time   SDES  05/31/2021 2300    BLOOD RIGHT ARM Performed at [facility], [place] [address], [place], [place] [address]    SPECREQUEST  05/31/2021 2300    BOTTLES DRAWN AEROBIC ONLY Blood Culture adequate volume Performed at [place] [address] [place], [place] [address]    CULT  05/31/2021 2300    NO GROWTH < 12 HOURS Performed at [place] [address] [place], [place] [address]    REPTSTATUS PENDING 05/31/2021 2300    Other culture-see note   Radiology Studies: CT HEAD WO CONTRAST (5MM)  Result  Date: 05/31/2021 CLINICAL DATA:  Altered mental status. EXAM: CT HEAD WITHOUT CONTRAST TECHNIQUE: Contiguous axial images were obtained from the base of the skull through the vertex without intravenous contrast. RADIATION DOSE REDUCTION: This exam was performed according to the departmental dose-optimization program which includes automated exposure control, adjustment of the mA and/or kV according to patient size and/or use of iterative reconstruction technique. COMPARISON:  Head CT dated 06/30/2020. FINDINGS: Brain: Mild age-related atrophy and chronic microvascular ischemic changes. There is no acute intracranial hemorrhage. No mass effect or midline shift. No extra-axial fluid collection. Vascular: No hyperdense vessel or unexpected calcification. Skull: Normal. Negative for fracture or focal lesion. Sinuses/Orbits: Diffuse mucoperiosteal thickening of paranasal sinuses with opacification of several ethmoid air cells as well as partial opacification of the right maxillary sinus. No air-fluid level. The mastoid air cells are clear. Other: None IMPRESSION: 1. No acute intracranial pathology. 2. Mild age-related atrophy and chronic microvascular ischemic changes. 3. Chronic paranasal sinus disease. Electronically Signed   By: Anner Crete M.D.   On: 05/31/2021 19:12   DG Chest Port 1 View  Result Date:  05/31/2021 CLINICAL DATA:  Shortness of breath.  Productive cough. EXAM: PORTABLE CHEST 1 VIEW COMPARISON:  Most recent radiograph 04/02/2021. FINDINGS: Lower lung volumes from prior exam. Stable heart size and mediastinal contours. Aortic atherosclerosis. Diffuse bilateral calcified pleural plaques. Suspected small bilateral pleural effusions. Increased peribronchial thickening. No pneumothorax. There may be patchy opacity in the left mid lung. IMPRESSION: 1. Lower lung volumes from prior exam. Increased peribronchial thickening may be bronchitis or pulmonary edema. 2. Patchy opacity in the left mid lung may  represent pneumonia in the appropriate clinical setting. 3. Suspected small bilateral pleural effusions. 4. Chronic calcified pleural plaques. Electronically Signed   By: Keith Rake M.D.   On: 05/31/2021 19:03     LOS: 1 day   Antonieta Pert, MD [facility]  06/01/2021, 11:04 AM

## 2021-06-01 NOTE — TOC Initial Note (Signed)
Transition of Care [facility]) - Initial/Assessment Note    Patient Details  Name: Cory Hansen MRN: [id number] Date of Birth: [birth year]  Transition [facility]) CM/SW Contact:    Lynnell Catalan, RN Phone Number: 06/01/2021, 9:57 AM  Clinical Narrative:                 Pt daughter Lander Hemauer called TOC to speak with me for dc planning. She states that there are 4 daughters that rotate caring for pt at home but they feel that they can't handle him anymore. I informed him that we could have PT/OT see him to see if the recommendation are for SNF. Then we could place him for short term rehab if his insurance authorizes SNF. She was informed that we do not place pt at long term care from the hospital. Informed her that [facility] would be back in touch with her if PT/OT recommends SNF. She states he first 2 choices of SNF are [facility] and [facility].           Activities of Daily Living Home Assistive Devices/Equipment: Wheelchair, Environmental consultant (specify type) ADL Screening (condition at time of admission) Patient's cognitive ability adequate to safely complete daily activities?: Yes Is the patient deaf or have difficulty hearing?: Yes Does the patient have difficulty seeing, even when wearing glasses/contacts?: Yes Does the patient have difficulty concentrating, remembering, or making decisions?: Yes Patient able to express need for assistance with ADLs?: Yes Does the patient have difficulty dressing or bathing?: Yes Independently performs ADLs?: No Communication: Needs assistance Is this a change from baseline?: Pre-admission baseline Dressing (OT): Needs assistance Is this a change from baseline?: Pre-admission baseline Grooming: Needs assistance Is this a change from baseline?: Pre-admission baseline Feeding: Needs assistance Is this a change from baseline?: Pre-admission baseline Bathing: Needs assistance Is this a change from baseline?: Pre-admission baseline Toileting:  Needs assistance Is this a change from baseline?: Pre-admission baseline In/Out Bed: Needs assistance Is this a change from baseline?: Pre-admission baseline Walks in Home: Needs assistance Is this a change from baseline?: Pre-admission baseline Does the patient have difficulty walking or climbing stairs?: Yes Weakness of Legs: Both Weakness of Arms/Hands: None  Permission Sought/Granted                  Emotional Assessment              Admission diagnosis:  Sepsis due to pneumonia ([place]) [J18.9, A41.9] Patient Active Problem List   Diagnosis Date Noted   Acute respiratory failure with hypoxia ([place]) 06/01/2021   Sepsis due to pneumonia ([place]) 05/31/2021   Normocytic anemia 05/31/2021   Stage 3a chronic kidney disease (CKD) ([place]) 05/31/2021   Acute encephalopathy 05/31/2021   Dementia ([place]) 10/21/2020   Choledocholithiasis 10/20/2020   Chronic diastolic CHF (congestive heart failure) ([place]) 06/20/2020   Permanent atrial fibrillation ([place]) 06/19/2020   Hypokalemia 03/14/2020   Acute right ankle pain 05/07/2019   Diarrhea 10/27/2015   Urinary dribbling 07/26/2015   Routine general medical examination at a health care facility 10/13/2014   CT, CHEST, ABNORMAL 01/23/2009   Chronic bilateral low back pain with right-sided sciatica 01/17/2009   NEOPLASM, MALIGNANT, BLADDER, HX OF 01/17/2009   Hx of benign neoplasm of prostate 01/17/2009   Hyperlipidemia 01/16/2009   ARTHRITIS 01/16/2009   Weakness 01/16/2009   PCP:  Hoyt Koch, MD Pharmacy:   [facility] ([facility]), [place] [id number] [place] ([place])  [place] [address] Phone: [phone] Fax: [phone]  [facility] ([facility] ) - [place], [address] [place] [address] Phone: [phone] Fax: [phone]     Social Determinants of Health (SDOH) Interventions    Readmission Risk  Interventions No flowsheet data found.

## 2021-06-01 NOTE — Assessment & Plan Note (Addendum)
On room air now. 

## 2021-06-01 NOTE — Assessment & Plan Note (Addendum)
Last echo in March 2020, euvolemic.  Holding Lasix due to lactic acidosis and elevated BNP on admission.  Resume upon discharge

## 2021-06-01 NOTE — Assessment & Plan Note (Signed)
On Eliquis, patient no longer on metoprolol due to hypotension.  Monitor

## 2021-06-01 NOTE — Progress Notes (Signed)
RT provided services at 0757- nebulizer treatment given. Flutter was delivered but PT is in and out of sleep- no instruction provided at this time.

## 2021-06-01 NOTE — Progress Notes (Signed)
Demonstrates hands on understanding of Flutter device. PT will ne help remember to participate- RN aware.

## 2021-06-01 NOTE — Assessment & Plan Note (Addendum)
Monitor hemoglobin 8-9 gm. Recent Labs  Lab 05/31/21 1814 06/01/21 0624  HGB 9.4* 8.9*  HCT 32.5* 30.4*

## 2021-06-01 NOTE — Assessment & Plan Note (Addendum)
Renal function is stable at baseline Recent Labs  Lab 05/31/21 1814 06/01/21 0624 06/02/21 0600 06/03/21 0623  BUN 20 18 19 19   CREATININE 1.18 1.00 0.93 1.01

## 2021-06-01 NOTE — Evaluation (Signed)
Occupational Therapy Evaluation Patient Details Name: Cory Hansen MRN: [id number] DOB: 1921-03-26 Today's Date: 06/01/2021   History of Present Illness 88 y.o. male with medical history significant for atrial fibrillation on Eliquis, CKD 3A, chronic diastolic CHF, and dementia, now presenting to the emergency department accompanied by daughter who is assisting with history, patient with increased confusion, productive cough, and shortness of breath. Patient admitted  For sepsis POA due to pneumonia/acute hypoxic aspiratory failure,   Clinical Impression   Cory Hansen is a 88 year old man who presents with generalized weakness, decreased activity tolerance, poor vision, and increased confusion resulting in a decline in his functional abilities. He is unable to report on his PLOF but his daughter reports he can typically ambulate to bathroom with walker and assist with UB ADLs and minimally with LB ADLs. Today he refuses to get out of the bed reporting he is cold and tired. His conversation is mostly inappropriate but he can follow commands. He is hard of hearing and visually impaired at baseline. He exhibits functional UB strength though impaired fine motor coordination from severe arthritic changes in bilateral hands. He is able to raise his legs in bed and sit straight up.  Patient will benefit from skilled OT services while in hospital to improve deficits and learn compensatory strategies as needed in order to return to PLOF.  Recommend short term rehab at discharge prior to return home.      Recommendations for follow up therapy are one component of a multi-disciplinary discharge planning process, led by the attending physician.  Recommendations may be updated based on patient status, additional functional criteria and insurance authorization.   Follow Up Recommendations  Skilled nursing-short term rehab (<3 hours/day)    Assistance Recommended at Discharge Frequent or constant  Supervision/Assistance  Patient can return home with the following A lot of help with walking and/or transfers;A lot of help with bathing/dressing/bathroom    Functional Status Assessment  Patient has had a recent decline in their functional status and demonstrates the ability to make significant improvements in function in a reasonable and predictable amount of time.  Equipment Recommendations  None recommended by OT    Recommendations for Other Services       Precautions / Restrictions Precautions Precautions: Fall Precaution Comments: low vision Restrictions Weight Bearing Restrictions: No      Mobility Bed Mobility                    Transfers                          Balance                                           ADL either performed or assessed with clinical judgement   ADL Overall ADL's : Needs assistance/impaired Eating/Feeding: Bed level;Set up;Supervision/ safety Eating/Feeding Details (indicate cue type and reason): needs supervision due to poor vision and impaired [facility] Grooming: Set up;Wash/dry face;Bed level   Upper Body Bathing: Maximal assistance;Bed level   Lower Body Bathing: Maximal assistance;Bed level   Upper Body Dressing : Maximal assistance;Bed level   Lower Body Dressing: Total assistance;Bed level       Toileting- Clothing Manipulation and Hygiene: Total assistance;Bed level         General ADL Comments: Patient not agreeable to  get out of bed today. Able to perform grooming task in bed.     Vision Baseline Vision/History: 6 Macular Degeneration Patient Visual Report: No change from baseline       Perception     Praxis      Pertinent Vitals/Pain Pain Assessment Pain Assessment: No/denies pain     Hand Dominance Right   Extremity/Trunk Assessment Upper Extremity Assessment Upper Extremity Assessment: RUE deficits/detail;LUE deficits/detail RUE Deficits / Details: WFL ROM in  shoulder, elbow, wrist and forearm; overall 4/5 strength, significant arthritic changes in hands RUE Sensation: WNL RUE Coordination: decreased fine motor LUE Deficits / Details: WFL ROM in shoulder, elbow, wrist and forearm; overall 4/5 strength, significant arthritic changes in hands LUE Coordination: decreased fine motor   Lower Extremity Assessment Lower Extremity Assessment: Defer to PT evaluation   Cervical / Trunk Assessment Cervical / Trunk Assessment: Kyphotic   Communication Communication Communication: HOH   Cognition Arousal/Alertness: Awake/alert Behavior During Therapy: WFL for tasks assessed/performed Overall Cognitive Status: History of cognitive impairments - at baseline                                 General Comments: Patient hard of hearing, alert to self. States he is 88. Able to follow commands but conversation typically inappropriate to context     General Comments  Patient able to lift legs, and sit up in bed but refused to transfer to side of bed or attempt to ambulate. Patient kept pulling covers back over legs.    Exercises     Shoulder Instructions      Home Living Family/patient expects to be discharged to:: Skilled nursing facility Living Arrangements: Children                               Additional Comments: Patient has 24/7 assistance from children at baseline.      Prior Functioning/Environment Prior Level of Function : Needs assist             Mobility Comments: typically ambulates with walker with min assist - needing more assistance to stand and transfer per daughter ADLs Comments: Able to assist with UB ADLs, needs a lot of assistance with LB dressing        OT Problem List: Decreased activity tolerance;Impaired balance (sitting and/or standing);Decreased cognition;Decreased safety awareness;Impaired vision/perception;Decreased knowledge of use of DME or AE;Cardiopulmonary status limiting activity       OT Treatment/Interventions: Self-care/ADL training;DME and/or AE instruction;Therapeutic activities;Balance training;Patient/family education;Therapeutic exercise;Cognitive remediation/compensation    OT Goals(Current goals can be found in the care plan section) Acute Rehab OT Goals Patient Stated Goal: improve functional abitlieis to baseline OT Goal Formulation: With patient/family Time For Goal Achievement: 06/15/21 Potential to Achieve Goals: Fair  OT Frequency: Min 2X/week    Co-evaluation              AM-PAC OT "6 Clicks" Daily Activity     Outcome Measure Help from another person eating meals?: A Little Help from another person taking care of personal grooming?: A Little Help from another person toileting, which includes using toliet, bedpan, or urinal?: Total Help from another person bathing (including washing, rinsing, drying)?: A Lot Help from another person to put on and taking off regular upper body clothing?: A Lot Help from another person to put on and taking off regular lower body clothing?: Total 6 Click  Score: 12   End of Session Nurse Communication: Mobility status  Activity Tolerance: Patient limited by fatigue Patient left: in bed;with bed alarm set;with family/visitor present  OT Visit Diagnosis: Muscle weakness (generalized) (M62.81)                Time: 9741-6384 OT Time Calculation (min): 20 min Charges:  OT General Charges $OT Visit: 1 Visit OT Evaluation $OT Eval Low Complexity: 1 Low  Ferris Tally, OTR/L Acute Care Rehab Services  Office [phone] Pager: [phone]   Kelli Churn 06/01/2021, 3:53 PM

## 2021-06-02 DIAGNOSIS — K5900 Constipation, unspecified: Secondary | ICD-10-CM

## 2021-06-02 LAB — BASIC METABOLIC PANEL
Anion gap: 6 (ref 5–15)
BUN: 19 mg/dL (ref 8–23)
CO2: 26 mmol/L (ref 22–32)
Calcium: 8.3 mg/dL — ABNORMAL LOW (ref 8.9–10.3)
Chloride: 107 mmol/L (ref 98–111)
Creatinine, Ser: 0.93 mg/dL (ref 0.61–1.24)
GFR, Estimated: >60 mL/min (ref >60)
Glucose, Bld: 100 mg/dL — ABNORMAL HIGH (ref 70–99)
Potassium: 4 mmol/L (ref 3.5–5.1)
Sodium: 139 mmol/L (ref 135–145)

## 2021-06-02 LAB — URINE CULTURE: Culture: <10000 — AB

## 2021-06-02 LAB — PROCALCITONIN: Procalcitonin: 0.2 ng/mL

## 2021-06-02 MED ORDER — POLYETHYLENE GLYCOL 3350 17 G PO PACK: 17.0000 g | PACK | Freq: Every day | ORAL | Status: DC

## 2021-06-02 MED ORDER — BISACODYL 10 MG RE SUPP: 10.0000 mg | Freq: Once | RECTAL | Status: DC

## 2021-06-02 NOTE — Progress Notes (Signed)
PROGRESS NOTE Cory Hansen  IOX:[id number] DOB: [birth year] DOA: 05/31/2021 PCP: Hoyt Koch, MD   Brief Narrative/Hospital Course: 88 y.o. male with medical history significant for atrial fibrillation on Eliquis, CKD 3A, chronic diastolic CHF, and dementia, now presenting to the emergency department accompanied by daughter who is assisting with history, patient with increased confusion, productive cough, and shortness of breath. He lives at home with 24-hour supervision from his family and has been noted to have a cough that is worsened over the past 3 weeks, becoming more productive of thick dark sputum, and associated with worsening shortness of breath and 2 weeks of increased confusion and visual hallucinations. In the ED vitals stable saturating 80s on room air tachypneic chest x-ray with no acute peribronchial thickening and patchy opacity in the left midlung likely small pleural effusion Labs showed leukocytosis initial lactic acid 2.1 COVID-19 PCR and influenza negative blood cultures collected placed on IV antibiotics and admitted For sepsis POA due to pneumonia/acute hypoxic aspiratory failure, acute metabolic encephalopathy in the setting of dementia, A-fib CKD anemia-chronic diastolic CHF. Seen by physical therapy Occupational Therapy and has recommended skilled nursing facility, family wanting placement as well.     Subjective: Seen and examined.  Daughters at the bedside, patient having constipation last 3 to 4 days Last night responded well with Seroquel low-dose.  No other new complaints Has not been eating well  Assessment and Plan: * Sepsis due to pneumonia ([place])- (present on admission) Sepsis due to pneumonia-community-acquired Acute hypoxic respiratory failure: Hypoxic 80s on room air in the ED, met sepsis criteria with leukocytosis tachypnea. Continue ceftriaxone azithromycin- so far culture data unyielding off oxygen vitals stable leukocytosis resolved.   Transition to oral antibiotics upon discharge to skilled nursing facility Recent Labs  Lab 05/31/21 1814 05/31/21 1842 05/31/21 2230 05/31/21 2300 06/01/21 0624 06/02/21 0600  WBC 12.2*  --   --   --  9.6  --   LATICACIDVEN  --  2.1* 1.7  --   --   --   PROCALCITON  --   --   --  0.30 0.22 0.20    Acute respiratory failure with hypoxia (HCC) On room air now  Constipation No BM 3 to 4 days now per daughter .start laxative stool softener, rectal suppository.  If no improvement try Fleet enema.  Acute encephalopathy- (present on admission) Acute Metabolic encephalopathy with dementia At this time fairly stable likely with baseline dementia/memory issue.Due to insomnia placed on low-dose trial of Seroquel/melatonin for the night  Stage 3a chronic kidney disease (CKD) ([place])- (present on admission) Renal function is stable at baseline Recent Labs  Lab 05/31/21 1814 06/01/21 0624 06/02/21 0600  BUN '20 18 19  ' CREATININE 1.18 1.00 0.93    Normocytic anemia- (present on admission) Monitor hemoglobin 8-9 gm. Recent Labs  Lab 05/31/21 1814 06/01/21 0624  HGB 9.4* 8.9*  HCT 32.5* 30.4*    Chronic diastolic CHF (congestive heart failure) ([place])- (present on admission) Last echo in March 2020, euvolemic.  Holding Lasix due to lactic acidosis and elevated BNP on admission  Permanent atrial fibrillation ([place])- (present on admission) On Eliquis, patient no longer on metoprolol due to hypotension.  Monitor   DVT prophylaxis: Eliquis Code Status:   Code Status: DNR Family Communication: plan of care discussed with patient/and daughter at bedside.  Disposition: Currently not medically stable for discharge. Status is: Inpatient Remains inpatient appropriate because: For ongoing management of pneumonia, deconditioning and PT OT evaluation and likely need skilled  nursing facility.  Anticipating discharging 1-2 days  Objective: Vitals last 24 hrs: Vitals:   06/01/21 0757 06/01/21  1919 06/02/21 0410 06/02/21 0805  BP:  134/85 134/83   Pulse:  98 (!) 105   Resp:  18 18   Temp:  98.2 F (36.8 C) 98.3 F (36.8 C)   TempSrc:  Oral Oral   SpO2: 94% 98% 92% 91%  Weight:      Height:       Weight change:   Physical Examination: General exam: AA, elderly, hard of hearing older than stated age, weak appearing. HEENT:Oral mucosa moist, Ear/Nose WNL grossly, dentition normal. Respiratory system: bilaterally diminished, no use of accessory muscle Cardiovascular system: S1 & S2 +, No JVD,. Gastrointestinal system: Abdomen soft,NT,ND,BS+ Nervous System:Alert, awake, moving extremities and grossly nonfocal Extremities: LE ankle edema none, distal peripheral pulses palpable.  Skin: No rashes,no icterus. MSK: Normal muscle bulk,tone, power   Medications reviewed:  Scheduled Meds:  albuterol  2.5 mg Nebulization BID   apixaban  5 mg Oral BID   azithromycin  500 mg Oral QHS   bisacodyl  10 mg Rectal Once   docusate sodium  100 mg Oral QHS   feeding supplement  237 mL Oral BID BM   melatonin  3 mg Oral QHS   pantoprazole  40 mg Oral Daily   polyethylene glycol  17 g Oral Daily   QUEtiapine  12.5 mg Oral QHS   tamsulosin  0.4 mg Oral QHS   Continuous Infusions:  cefTRIAXone (ROCEPHIN)  IV 2 g (06/01/21 1824)      Diet Order             Diet Heart Room service appropriate? Yes; Fluid consistency: Thin  Diet effective now                            Intake/Output Summary (Last 24 hours) at 06/02/2021 1050 Last data filed at 06/01/2021 1416 Gross per 24 hour  Intake 120 ml  Output 250 ml  Net -130 ml   Net IO Since Admission: -171.01 mL [06/02/21 1050]  Wt Readings from Last 3 Encounters:  05/31/21 78.4 kg  04/16/21 80.3 kg  04/02/21 76.7 kg     Unresulted Labs (From admission, onward)     Start     Ordered   06/01/21 8309  Basic metabolic panel  Daily,   R      05/31/21 2218   05/31/21 2217  Expectorated Sputum Assessment w Gram Stain,  Rflx to Resp Cult  (COPD / Pneumonia / Cellulitis / Lower Extremity Wound)  Once,   R        05/31/21 2218   05/31/21 2217  Legionella Pneumophila Serogp 1 Ur Ag  (COPD / Pneumonia / Cellulitis / Lower Extremity Wound)  Add-on,   AD        05/31/21 2218          Data Reviewed: I have personally reviewed following labs and imaging studies CBC: Recent Labs  Lab 05/31/21 1814 06/01/21 0624  WBC 12.2* 9.6  NEUTROABS 9.2*  --   HGB 9.4* 8.9*  HCT 32.5* 30.4*  MCV 90.5 89.4  PLT 268 407   Basic Metabolic Panel: Recent Labs  Lab 05/31/21 1814 06/01/21 0624 06/02/21 0600  NA 138 140 139  K 4.6 4.1 4.0  CL 104 107 107  CO2 '27 24 26  ' GLUCOSE 93 94 100*  BUN 20 18 19  CREATININE 1.18 1.00 0.93  CALCIUM 8.8* 8.6* 8.3*   GFR: Estimated Creatinine Clearance: 47.2 mL/min (by C-G formula based on SCr of 0.93 mg/dL). Liver Function Tests: Recent Labs  Lab 05/31/21 1814  AST 27  ALT 23  ALKPHOS 89  BILITOT 0.5  PROT 6.8  ALBUMIN 3.6   No results for input(s): LIPASE, AMYLASE in the last 168 hours. No results for input(s): AMMONIA in the last 168 hours. Coagulation Profile: No results for input(s): INR, PROTIME in the last 168 hours. Cardiac Enzymes: No results for input(s): CKTOTAL, CKMB, CKMBINDEX, TROPONINI in the last 168 hours. BNP (last 3 results) Recent Labs    04/16/21 1415  PROBNP 595.0*   HbA1C: No results for input(s): HGBA1C in the last 72 hours. CBG: No results for input(s): GLUCAP in the last 168 hours. Lipid Profile: No results for input(s): CHOL, HDL, LDLCALC, TRIG, CHOLHDL, LDLDIRECT in the last 72 hours. Thyroid Function Tests: No results for input(s): TSH, T4TOTAL, FREET4, T3FREE, THYROIDAB in the last 72 hours. Anemia Panel: No results for input(s): VITAMINB12, FOLATE, FERRITIN, TIBC, IRON, RETICCTPCT in the last 72 hours. Sepsis Labs: Recent Labs  Lab 05/31/21 1842 05/31/21 2230 05/31/21 2300 06/01/21 0624 06/02/21 0600  PROCALCITON   --   --  0.30 0.22 0.20  LATICACIDVEN 2.1* 1.7  --   --   --     Recent Results (from the past 240 hour(s))  Resp Panel by RT-PCR (Flu A&B, Covid) Nasopharyngeal Swab     Status: None   Collection Time: 05/31/21  6:28 PM   Specimen: Nasopharyngeal Swab; Nasopharyngeal(NP) swabs in vial transport medium  Result Value Ref Range Status   SARS Coronavirus 2 by RT PCR NEGATIVE NEGATIVE Final    Comment: (NOTE) SARS-CoV-2 target nucleic acids are NOT DETECTED.  The SARS-CoV-2 RNA is generally detectable in upper respiratory specimens during the acute phase of infection. The lowest concentration of SARS-CoV-2 viral copies this assay can detect is 138 copies/mL. A negative result does not preclude SARS-Cov-2 infection and should not be used as the sole basis for treatment or other patient management decisions. A negative result may occur with  improper specimen collection/handling, submission of specimen other than nasopharyngeal swab, presence of viral mutation(s) within the areas targeted by this assay, and inadequate number of viral copies(<138 copies/mL). A negative result must be combined with clinical observations, patient history, and epidemiological information. The expected result is Negative.  Fact Sheet for Patients:  EntrepreneurPulse.com.au  Fact Sheet for Healthcare Providers:  IncredibleEmployment.be  This test is no t yet approved or cleared by the [place] FDA and  has been authorized for detection and/or diagnosis of SARS-CoV-2 by FDA under an Emergency Use Authorization (EUA). This EUA will remain  in effect (meaning this test can be used) for the duration of the COVID-19 declaration under Section 564(b)(1) of the Act, 21 U.S.C.section 360bbb-3(b)(1), unless the authorization is terminated  or revoked sooner.       Influenza A by PCR NEGATIVE NEGATIVE Final   Influenza B by PCR NEGATIVE NEGATIVE Final    Comment:  (NOTE) The Xpert Xpress SARS-CoV-2/FLU/RSV plus assay is intended as an aid in the diagnosis of influenza from Nasopharyngeal swab specimens and should not be used as a sole basis for treatment. Nasal washings and aspirates are unacceptable for Xpert Xpress SARS-CoV-2/FLU/RSV testing.  Fact Sheet for Patients: EntrepreneurPulse.com.au  Fact Sheet for Healthcare Providers: IncredibleEmployment.be  This test is not yet approved or cleared by the [place]  FDA and has been authorized for detection and/or diagnosis of SARS-CoV-2 by FDA under an Emergency Use Authorization (EUA). This EUA will remain in effect (meaning this test can be used) for the duration of the COVID-19 declaration under Section 564(b)(1) of the Act, 21 U.S.C. section 360bbb-3(b)(1), unless the authorization is terminated or revoked.  Performed at [facility], [place] [address], [place], [place] [address]   Urine Culture     Status: Abnormal   Collection Time: 05/31/21  8:00 PM   Specimen: Urine, Clean Catch  Result Value Ref Range Status   Specimen Description   Final    URINE, CLEAN CATCH Performed at [facility], [place] [address], [place], [place] [address]    Special Requests   Final    NONE Performed at [facility], [place] [address] [place], [place] [address]    Culture (A)  Final    <10,000 COLONIES/mL INSIGNIFICANT GROWTH Performed at [facility] [address], [place], [place] [address]    Report Status 06/02/2021 FINAL  Final  Blood culture (routine x 2)     Status: None (Preliminary result)   Collection Time: 05/31/21  8:00 PM   Specimen: Left Antecubital; Blood  Result Value Ref Range Status   Specimen Description   Final    LEFT ANTECUBITAL Performed at [facility] [address], [place], [place] [address]    Special Requests   Final    BOTTLES DRAWN AEROBIC AND  ANAEROBIC Blood Culture results may not be optimal due to an inadequate volume of blood received in culture bottles Performed at [facility] [address], [place], [place] [address]    Culture   Final    NO GROWTH 2 DAYS Performed at [facility] [address] [place], [place] [address]    Report Status PENDING  Incomplete  Blood culture (routine x 2)     Status: None (Preliminary result)   Collection Time: 05/31/21 11:00 PM   Specimen: BLOOD RIGHT ARM  Result Value Ref Range Status   Specimen Description   Final    BLOOD RIGHT ARM Performed at [facility] [address], [place], [place] [address]    Special Requests   Final    BOTTLES DRAWN AEROBIC ONLY Blood Culture adequate volume Performed at [facility] [address] [place], [place] [address]    Culture   Final    NO GROWTH 2 DAYS Performed at [facility] [address], [place],  [address]    Report Status PENDING  Incomplete    Antimicrobials: Anti-infectives (From admission, onward)    Start     Dose/Rate Route Frequency Ordered Stop   06/01/21 2200  azithromycin (ZITHROMAX) tablet 500 mg        500 mg Oral Daily at bedtime 06/01/21 6803 06/05/21 2159   06/01/21 1930  cefTRIAXone (ROCEPHIN) 2 g in sodium chloride 0.9 % 100 mL IVPB        2 g 200 mL/hr over 30 Minutes Intravenous Every 24 hours 05/31/21 2218 06/05/21 1929   06/01/21 1930  azithromycin (ZITHROMAX) 500 mg in sodium chloride 0.9 % 250 mL IVPB  Status:  Discontinued        500 mg 250 mL/hr over 60 Minutes Intravenous Every 24 hours 05/31/21 2218 06/01/21 0952   05/31/21 1945  cefTRIAXone (ROCEPHIN) 1 g in sodium chloride 0.9 % 100 mL  IVPB  Status:  Discontinued        1 g 200 mL/hr over 30 Minutes Intravenous  Once 05/31/21 1940 05/31/21 2001   05/31/21 1945  azithromycin (ZITHROMAX) 500 mg in sodium chloride 0.9 % 250 mL IVPB  Status:  Discontinued        500  mg 250 mL/hr over 60 Minutes Intravenous  Once 05/31/21 1940 05/31/21 2002   05/31/21 1930  cefTRIAXone (ROCEPHIN) 1 g in sodium chloride 0.9 % 100 mL IVPB        1 g 200 mL/hr over 30 Minutes Intravenous  Once 05/31/21 1923 05/31/21 2227   05/31/21 1930  azithromycin (ZITHROMAX) 500 mg in sodium chloride 0.9 % 250 mL IVPB        500 mg 250 mL/hr over 60 Minutes Intravenous  Once 05/31/21 1923 05/31/21 2226      Culture/Microbiology    Component Value Date/Time   SDES  05/31/2021 2300    BLOOD RIGHT ARM Performed at [facility], [place] [address], [place], [place] [address]    SPECREQUEST  05/31/2021 2300    BOTTLES DRAWN AEROBIC ONLY Blood Culture adequate volume Performed at [facility] [address] [place], [place] [address]    CULT  05/31/2021 2300    NO GROWTH 2 DAYS Performed at [facility] [address], [place], [place] [address]    REPTSTATUS PENDING 05/31/2021 2300    Other culture-see note   Radiology Studies: CT HEAD WO CONTRAST (5MM)  Result Date: 05/31/2021 CLINICAL DATA:  Altered mental status. EXAM: CT HEAD WITHOUT CONTRAST TECHNIQUE: Contiguous axial images were obtained from the base of the skull through the vertex without intravenous contrast. RADIATION DOSE REDUCTION: This exam was performed according to the departmental dose-optimization program which includes automated exposure control, adjustment of the mA and/or kV according to patient size and/or use of iterative reconstruction technique. COMPARISON:  Head CT dated 06/30/2020. FINDINGS: Brain: Mild age-related atrophy and chronic microvascular ischemic changes. There is no acute intracranial hemorrhage. No mass effect or midline shift. No extra-axial fluid collection. Vascular: No hyperdense vessel or unexpected calcification. Skull: Normal. Negative for fracture or focal lesion. Sinuses/Orbits: Diffuse mucoperiosteal thickening of paranasal sinuses with  opacification of several ethmoid air cells as well as partial opacification of the right maxillary sinus. No air-fluid level. The mastoid air cells are clear. Other: None IMPRESSION: 1. No acute intracranial pathology. 2. Mild age-related atrophy and chronic microvascular ischemic changes. 3. Chronic paranasal sinus disease. Electronically Signed   By: Anner Crete M.D.   On: 05/31/2021 19:12   DG Chest Port 1 View  Result Date: 05/31/2021 CLINICAL DATA:  Shortness of breath.  Productive cough. EXAM: PORTABLE CHEST 1 VIEW COMPARISON:  Most recent radiograph 04/02/2021. FINDINGS: Lower lung volumes from prior exam. Stable heart size and mediastinal contours. Aortic atherosclerosis. Diffuse bilateral calcified pleural plaques. Suspected small bilateral pleural effusions. Increased peribronchial thickening. No pneumothorax. There may be patchy opacity in the left mid lung. IMPRESSION: 1. Lower lung volumes from prior exam. Increased peribronchial thickening may be bronchitis or pulmonary edema. 2. Patchy opacity in the left mid lung may represent pneumonia in the appropriate clinical setting. 3. Suspected small bilateral pleural effusions. 4. Chronic calcified pleural plaques. Electronically Signed   By: Keith Rake M.D.   On: 05/31/2021 19:03     LOS: 2 days   Antonieta Pert, MD [facility]  06/02/2021, 10:50 AM

## 2021-06-02 NOTE — Evaluation (Signed)
Physical Therapy Evaluation Patient Details Name: Cory Hansen MRN: [id number] DOB: 07/25/1920 Today's Date: 06/02/2021  History of Present Illness  Pt is 88 y.o. male admitted on 05/31/21 with sepsis due to PNE/resp failure.  Pt with medical history significant for atrial fibrillation on Eliquis, CKD 3A, chronic diastolic CHF, and dementia  Clinical Impression  Pt admitted with above diagnosis. At baseline, pt has 24 hr care, supervision/min A with transfers, and ambulates limited distances with RW. Today, pt requiring mod A for transfers and only able to take a few steps to chair before fatiguing.  Daughter reports they are unable to provide that level of support.  Recommend SNF at d/c.  Pt currently with functional limitations due to the deficits listed below (see PT Problem List). Pt will benefit from skilled PT to increase their independence and safety with mobility to allow discharge to the venue listed below.          Recommendations for follow up therapy are one component of a multi-disciplinary discharge planning process, led by the attending physician.  Recommendations may be updated based on patient status, additional functional criteria and insurance authorization.  Follow Up Recommendations Skilled nursing-short term rehab (<3 hours/day)    Assistance Recommended at Discharge Frequent or constant Supervision/Assistance  Patient can return home with the following  A lot of help with walking and/or transfers;A lot of help with bathing/dressing/bathroom;Assistance with cooking/housework    Equipment Recommendations None recommended by PT  Recommendations for Other Services       Functional Status Assessment Patient has had a recent decline in their functional status and demonstrates the ability to make significant improvements in function in a reasonable and predictable amount of time.     Precautions / Restrictions Precautions Precautions: Fall Precaution Comments: low  vision      Mobility  Bed Mobility Overal bed mobility: Needs Assistance Bed Mobility: Supine to Sit     Supine to sit: Min guard, HOB elevated     General bed mobility comments: use of rail and increased time with cues    Transfers Overall transfer level: Needs assistance Equipment used: Rolling walker (2 wheels) Transfers: Sit to/from Stand, Bed to chair/wheelchair/BSC Sit to Stand: Mod assist, From elevated surface   Step pivot transfers: Min assist       General transfer comment: Increased time, cues for hand, light mod A to stand.  Pt taking small steps to chair with cues for direction, min A for balance and assist with RW - fatigued easily    Ambulation/Gait               General Gait Details: only steps to chair - fatigued  Stairs            Wheelchair Mobility    Modified Rankin (Stroke Patients Only)       Balance Overall balance assessment: Needs assistance Sitting-balance support: No upper extremity supported Sitting balance-Leahy Scale: Good     Standing balance support: Bilateral upper extremity supported, No upper extremity supported Standing balance-Leahy Scale: Poor Standing balance comment: Pt requiring RW and min guard-min A; did release RW to use urinal but bracing legs on chair                             Pertinent Vitals/Pain Pain Assessment Pain Assessment: No/denies pain    Home Living Family/patient expects to be discharged to:: Skilled nursing facility Living Arrangements: Children  Additional Comments: Patient has 24/7 assistance from children at baseline.    Prior Function Prior Level of Function : Needs assist             Mobility Comments: typically ambulates with walker with min guard - needing more assistance to stand and transfer per daughter ADLs Comments: Able to assist with UB ADLs, needs a lot of assistance with LB dressing     Hand Dominance         Extremity/Trunk Assessment   Upper Extremity Assessment Upper Extremity Assessment: Defer to OT evaluation    Lower Extremity Assessment Lower Extremity Assessment: LLE deficits/detail;RLE deficits/detail RLE Deficits / Details: Lacks 5-10 degrees knee ext otherwise ROM WFL; MMT 5/5 LLE Deficits / Details: Lacks 5-10 degrees knee ext otherwise ROM WFL; MMT 5/5    Cervical / Trunk Assessment Cervical / Trunk Assessment: Kyphotic  Communication   Communication: HOH  Cognition Arousal/Alertness: Awake/alert Behavior During Therapy: WFL for tasks assessed/performed Overall Cognitive Status: History of cognitive impairments - at baseline                                 General Comments: Patient hard of hearing, alert to self. Able to follow commands.        General Comments General comments (skin integrity, edema, etc.): VSS; pt with choking episode while drinking water (notified RN and MD)    Exercises     Assessment/Plan    PT Assessment Patient needs continued PT services  PT Problem List Decreased strength;Decreased mobility;Decreased safety awareness;Decreased coordination;Decreased activity tolerance;Decreased cognition;Cardiopulmonary status limiting activity;Decreased balance       PT Treatment Interventions DME instruction;Therapeutic activities;Gait training;Therapeutic exercise;Patient/family education;Functional mobility training;Balance training    PT Goals (Current goals can be found in the Care Plan section)  Acute Rehab PT Goals Patient Stated Goal: family request SNF PT Goal Formulation: With patient/family Time For Goal Achievement: 06/16/21 Potential to Achieve Goals: Good    Frequency Min 2X/week     Co-evaluation               AM-PAC PT "6 Clicks" Mobility  Outcome Measure Help needed turning from your back to your side while in a flat bed without using bedrails?: A Little Help needed moving from lying on your back to  sitting on the side of a flat bed without using bedrails?: A Little Help needed moving to and from a bed to a chair (including a wheelchair)?: A Lot Help needed standing up from a chair using your arms (e.g., wheelchair or bedside chair)?: A Lot Help needed to walk in hospital room?: Total Help needed climbing 3-5 steps with a railing? : Total 6 Click Score: 12    End of Session Equipment Utilized During Treatment: Gait belt Activity Tolerance: Patient limited by fatigue Patient left: with chair alarm set;in chair;with call bell/phone within reach;with family/visitor present Nurse Communication: Mobility status PT Visit Diagnosis: Other abnormalities of gait and mobility (R26.89);Muscle weakness (generalized) (M62.81)    Time: 1300-1315 PT Time Calculation (min) (ACUTE ONLY): 15 min   Charges:   PT Evaluation $PT Eval Low Complexity: 1 Low          Cory Hansen   Abigayle Wilinski H Izyan Ezzell 06/02/2021, 1:58 PM

## 2021-06-02 NOTE — Evaluation (Signed)
Clinical/Bedside Swallow Evaluation Patient Details  Name: Cory Hansen MRN: [id number] Date of Birth: 08-31-1920  Today's Date: 06/02/2021 Time: SLP Start Time (ACUTE ONLY): 1635 SLP Stop Time (ACUTE ONLY): 1655 SLP Time Calculation (min) (ACUTE ONLY): 20 min  Past Medical History:  Past Medical History:  Diagnosis Date   Arthritis    Atrial fibrillation (HCC)    Kidney stones    Shingles    Past Surgical History:  Past Surgical History:  Procedure Laterality Date   AMPUTATION Left 02/22/2014   Procedure: INCISION AND DRAINAGE LEFT FOREFOOT AMPUTATION FIRST RAY;  Surgeon: Toni Arthurs, MD;  Location: [facility] OR;  Service: Orthopedics;  Laterality: Left;   APPENDECTOMY     ERCP N/A 10/25/2020   Procedure: ENDOSCOPIC RETROGRADE CHOLANGIOPANCREATOGRAPHY (ERCP);  Surgeon: Vida Rigger, MD;  Location: [facility];  Service: Endoscopy;  Laterality: N/A;   EYE SURGERY Bilateral    cataract surgery w/ lens implant   HERNIA REPAIR     inguinal hernia repair   REMOVAL OF STONES  10/25/2020   Procedure: REMOVAL OF STONES;  Surgeon: Vida Rigger, MD;  Location: [facility];  Service: Endoscopy;;   SPHINCTEROTOMY  10/25/2020   Procedure: Dennison Mascot;  Surgeon: Vida Rigger, MD;  Location: [facility];  Service: Endoscopy;;   HPI:  Patient is a 88 y.o. male with PMH: atrial fibrillation on Eliquis, CKD 3A, chronic diastolic CHF, and dementia. He presented to ED with increased confusion, productive cough and SOB. Patient lives at home with 24 hour supervision from his family and had been noted by them to h ave cough that has worsened over the past three weeks, becoming more productive of thick dark sputum. In ED, patient was afebrile, oxygen saturations in low 80%'s, mild tachypneic, mildly tachycardic and EKG showing afib. Head CT negative for acute intracranial abnormality. CXR revealed increased peribronchial thickening, patchy opacity of left midlung and likely small pleural effusions.     Assessment / Plan / Recommendation  Clinical Impression  Patient presents with clinical s/s of dysphagia as per this clinical/bedside swallow evaluation. Per daughter patient will occasionally get choked up at home when eating but overall he does pretty well. When SLP arrived patient was sitting in his recliner and his dinner meal tray had just arrived. Patient has visual deficits as well as arthritis making it difficult for him to see food and manage utensils. Daughter cut up his food and helped feed him. Patient has top and bottom dentures which were loose but only led to mildly decreased mastication of solids. Patient exhibited two mildly delayed cough responses following sips of liquids, with one of those coughs being productive. No oral residuals after completing PO's. Patient able to remove his dentures and SLP soaked them in denture cup. SLP plans to follow up with patient at least one more time to ensure he is tolerating PO's without significant difficulty. SLP Visit Diagnosis: Dysphagia, unspecified (R13.10)    Aspiration Risk  No limitations;Mild aspiration risk    Diet Recommendation Regular;Thin liquid   Liquid Administration via: Cup;Straw Medication Administration: Whole meds with liquid Supervision: Full supervision/cueing for compensatory strategies;Staff to assist with self feeding Compensations: Slow rate;Small sips/bites Postural Changes: Seated upright at 90 degrees    Other  Recommendations Oral Care Recommendations: Oral care BID;Staff/trained caregiver to provide oral care    Recommendations for follow up therapy are one component of a multi-disciplinary discharge planning process, led by the attending physician.  Recommendations may be updated based on patient status, additional functional criteria  and insurance authorization.  Follow up Recommendations No SLP follow up      Assistance Recommended at Discharge Frequent or constant Supervision/Assistance  Functional  Status Assessment Patient has had a recent decline in their functional status and demonstrates the ability to make significant improvements in function in a reasonable and predictable amount of time.  Frequency and Duration min 1 x/week  1 week       Prognosis Prognosis for Safe Diet Advancement: Good      Swallow Study   General Date of Onset: 05/31/21 HPI: Patient is a 88 y.o. male with PMH: atrial fibrillation on Eliquis, CKD 3A, chronic diastolic CHF, and dementia. He presented to ED with increased confusion, productive cough and SOB. Patient lives at home with 24 hour supervision from his family and had been noted by them to h ave cough that has worsened over the past three weeks, becoming more productive of thick dark sputum. In ED, patient was afebrile, oxygen saturations in low 80%'s, mild tachypneic, mildly tachycardic and EKG showing afib. Head CT negative for acute intracranial abnormality. CXR revealed increased peribronchial thickening, patchy opacity of left midlung and likely small pleural effusions. Type of Study: Bedside Swallow Evaluation Previous Swallow Assessment: BSE in March of 2022, no significant findings Diet Prior to this Study: Regular;Thin liquids Temperature Spikes Noted: No Respiratory Status: Room air History of Recent Intubation: No Behavior/Cognition: Alert;Cooperative;Pleasant mood Oral Cavity Assessment: Within Functional Limits Oral Care Completed by SLP: Recent completion by staff Oral Cavity - Dentition: Dentures, top;Dentures, bottom Vision: Impaired for self-feeding Self-Feeding Abilities: Needs set up;Needs assist;Total assist Patient Positioning: Postural control adequate for testing;Upright in chair Baseline Vocal Quality: Normal Volitional Cough: Congested Volitional Swallow: Able to elicit    Oral/Motor/Sensory Function Overall Oral Motor/Sensory Function: Within functional limits   Ice Chips     Thin Liquid Thin Liquid:  Impaired Presentation: Self Fed;Cup Pharyngeal  Phase Impairments: Throat Clearing - Delayed;Cough - Delayed Other Comments: two instances of mildly delayed cough and throat clearing, one of these times being producitive.    Nectar Thick     Honey Thick     Puree Puree: Not tested   Solid     Solid: Impaired Oral Phase Impairments: Impaired mastication Other Comments: mildly decreased mastication suspected due to dentures being ill fitting (loose)      Angela Nevin, MA, CCC-SLP Speech Therapy

## 2021-06-02 NOTE — Assessment & Plan Note (Addendum)
Having bowel movement here continue with laxatives

## 2021-06-03 LAB — BASIC METABOLIC PANEL
Anion gap: 6 (ref 5–15)
BUN: 19 mg/dL (ref 8–23)
CO2: 28 mmol/L (ref 22–32)
Calcium: 8.8 mg/dL — ABNORMAL LOW (ref 8.9–10.3)
Chloride: 107 mmol/L (ref 98–111)
Creatinine, Ser: 1.01 mg/dL (ref 0.61–1.24)
GFR, Estimated: >60 mL/min (ref >60)
Glucose, Bld: 101 mg/dL — ABNORMAL HIGH (ref 70–99)
Potassium: 4.3 mmol/L (ref 3.5–5.1)
Sodium: 141 mmol/L (ref 135–145)

## 2021-06-03 NOTE — Progress Notes (Signed)
PROGRESS NOTE Cory Hansen  YKD:[id number] DOB: 27-Jun-1920 DOA: 05/31/2021 PCP: Hoyt Koch, MD   Brief Narrative/Hospital Course: 88 y.o. male with medical history significant for atrial fibrillation on Eliquis, CKD 3A, chronic diastolic CHF, and dementia, now presenting to the emergency department accompanied by daughter who is assisting with history, patient with increased confusion, productive cough, and shortness of breath. He lives at home with 24-hour supervision from his family and has been noted to have a cough that is worsened over the past 3 weeks, becoming more productive of thick dark sputum, and associated with worsening shortness of breath and 2 weeks of increased confusion and visual hallucinations. In the ED vitals stable saturating 80s on room air tachypneic chest x-ray with no acute peribronchial thickening and patchy opacity in the left midlung likely small pleural effusion Labs showed leukocytosis initial lactic acid 2.1 COVID-19 PCR and influenza negative blood cultures collected placed on IV antibiotics and admitted For sepsis POA due to pneumonia/acute hypoxic aspiratory failure, acute metabolic encephalopathy in the setting of dementia, A-fib CKD anemia-chronic diastolic CHF. Seen by physical therapy Occupational Therapy and has recommended skilled nursing facility, family wanting placement as well. At this time patient is clinically improving, off oxygen having bowel movement, mental status is stable. Awaiting on placement     Subjective: Seen and examined this morning patient is alert awake baseline hard of hearing/memory issues interacts answers questions. Overall stable night did wake up intermittently for bowel movement/voiding Responded to Seroquel  Assessment and Plan: * Sepsis due to pneumonia ([place])- (present on admission) Sepsis due to pneumonia-community-acquired Acute hypoxic respiratory failure: Hypoxic in 80s on RA in the ED and met sepsis  criteria with leukocytosis tachypnea. Clinically improved off oxygen leukocytosis resolved.  Culture data unyielding. Continue ceftriaxone azithromycin for today transition to oral antibiotics for discharge likely tomorrow.  Repeat chest x-ray in 4 weeks and follow-up with PCP as outpatient Recent Labs  Lab 05/31/21 1814 05/31/21 1842 05/31/21 2230 05/31/21 2300 06/01/21 0624 06/02/21 0600  WBC 12.2*  --   --   --  9.6  --   LATICACIDVEN  --  2.1* 1.7  --   --   --   PROCALCITON  --   --   --  0.30 0.22 0.20    Acute respiratory failure with hypoxia (HCC) On room air now  Constipation Having bowel movement here continue with laxatives  Acute encephalopathy- (present on admission) Acute Metabolic encephalopathy with dementia At this time fairly stable mental status.  Baseline hearing difficulties and memory problems patient's family member taking down to help with this memory issues during the night which seems to have helped.  Is responding well to low-dose Seroquel and melatonin during night.    Stage 3a chronic kidney disease (CKD) ([place])- (present on admission) Renal function is stable at baseline Recent Labs  Lab 05/31/21 1814 06/01/21 0624 06/02/21 0600 06/03/21 0623  BUN '20 18 19 19  ' CREATININE 1.18 1.00 0.93 1.01    Normocytic anemia- (present on admission) Monitor hemoglobin 8-9 gm. Recent Labs  Lab 05/31/21 1814 06/01/21 0624  HGB 9.4* 8.9*  HCT 32.5* 30.4*    Chronic diastolic CHF (congestive heart failure) ([place])- (present on admission) Last echo in March 2020, euvolemic.  Holding Lasix due to lactic acidosis and elevated BNP on admission.  Resume upon discharge  Permanent atrial fibrillation ([place])- (present on admission) On Eliquis, patient no longer on metoprolol due to hypotension.  Monitor   DVT prophylaxis: Eliquis Code  Status:   Code Status: DNR Family Communication: plan of care discussed with patient/and daughter at bedside.  Disposition:  Currently medically stable for discharge. Status is: Inpatient Remains inpatient appropriate because: For ongoing management of pneumonia, deconditioning and PT OT evaluation and awaiting placement , Auth in progress anticipate discharge tomorrow to get for health  Objective: Vitals last 24 hrs: Vitals:   06/02/21 2030 06/03/21 0636 06/03/21 0640 06/03/21 0730  BP: 116/65 129/85    Pulse: (!) 103 100 100   Resp: 18 18    Temp: 98.7 F (37.1 C) (!) 97.5 F (36.4 C)    TempSrc: Oral Oral    SpO2: 91% (!) 88% 91% 90%  Weight:      Height:       Weight change:   Physical Examination: General exam: Alert awake hard of hearing older than stated age, weak appearing. HEENT:Oral mucosa moist, Ear/Nose WNL grossly, dentition normal. Respiratory system: bilaterally clear,no use of accessory muscle Cardiovascular system: S1 & S2 +, No JVD,. Gastrointestinal system: Abdomen soft,NT,ND, BS+ Nervous System:Alert, awake, moving extremities and grossly nonfocal Extremities: edema neg,distal peripheral pulses palpable.  Skin: No rashes,no icterus. MSK: Normal muscle bulk,tone, power   Medications reviewed:  Scheduled Meds:  albuterol  2.5 mg Nebulization BID   apixaban  5 mg Oral BID   azithromycin  500 mg Oral QHS   bisacodyl  10 mg Rectal Once   docusate sodium  100 mg Oral QHS   feeding supplement  237 mL Oral BID BM   melatonin  3 mg Oral QHS   pantoprazole  40 mg Oral Daily   polyethylene glycol  17 g Oral Daily   QUEtiapine  12.5 mg Oral QHS   tamsulosin  0.4 mg Oral QHS   Continuous Infusions:  cefTRIAXone (ROCEPHIN)  IV Stopped (06/02/21 2300)      Diet Order             Diet Heart Room service appropriate? Yes; Fluid consistency: Thin  Diet effective now                            Intake/Output Summary (Last 24 hours) at 06/03/2021 1106 Last data filed at 06/03/2021 7342 Gross per 24 hour  Intake 520.29 ml  Output 450 ml  Net 70.29 ml   Net IO Since  Admission: -100.72 mL [06/03/21 1106]  Wt Readings from Last 3 Encounters:  05/31/21 78.4 kg  04/16/21 80.3 kg  04/02/21 76.7 kg     Unresulted Labs (From admission, onward)     Start     Ordered   05/31/21 2217  Expectorated Sputum Assessment w Gram Stain, Rflx to Resp Cult  (COPD / Pneumonia / Cellulitis / Lower Extremity Wound)  Once,   R        05/31/21 2218   05/31/21 2217  Legionella Pneumophila Serogp 1 Ur Ag  (COPD / Pneumonia / Cellulitis / Lower Extremity Wound)  Add-on,   AD        05/31/21 2218          Data Reviewed: I have personally reviewed following labs and imaging studies CBC: Recent Labs  Lab 05/31/21 1814 06/01/21 0624  WBC 12.2* 9.6  NEUTROABS 9.2*  --   HGB 9.4* 8.9*  HCT 32.5* 30.4*  MCV 90.5 89.4  PLT 268 876   Basic Metabolic Panel: Recent Labs  Lab 05/31/21 1814 06/01/21 0624 06/02/21 0600 06/03/21 0623  NA  138 140 139 141  K 4.6 4.1 4.0 4.3  CL 104 107 107 107  CO2 '27 24 26 28  ' GLUCOSE 93 94 100* 101*  BUN '20 18 19 19  ' CREATININE 1.18 1.00 0.93 1.01  CALCIUM 8.8* 8.6* 8.3* 8.8*   GFR: Estimated Creatinine Clearance: 43.5 mL/min (by C-G formula based on SCr of 1.01 mg/dL). Liver Function Tests: Recent Labs  Lab 05/31/21 1814  AST 27  ALT 23  ALKPHOS 89  BILITOT 0.5  PROT 6.8  ALBUMIN 3.6   No results for input(s): LIPASE, AMYLASE in the last 168 hours. No results for input(s): AMMONIA in the last 168 hours. Coagulation Profile: No results for input(s): INR, PROTIME in the last 168 hours. Cardiac Enzymes: No results for input(s): CKTOTAL, CKMB, CKMBINDEX, TROPONINI in the last 168 hours. BNP (last 3 results) Recent Labs    04/16/21 1415  PROBNP 595.0*   HbA1C: No results for input(s): HGBA1C in the last 72 hours. CBG: No results for input(s): GLUCAP in the last 168 hours. Lipid Profile: No results for input(s): CHOL, HDL, LDLCALC, TRIG, CHOLHDL, LDLDIRECT in the last 72 hours. Thyroid Function Tests: No results  for input(s): TSH, T4TOTAL, FREET4, T3FREE, THYROIDAB in the last 72 hours. Anemia Panel: No results for input(s): VITAMINB12, FOLATE, FERRITIN, TIBC, IRON, RETICCTPCT in the last 72 hours. Sepsis Labs: Recent Labs  Lab 05/31/21 1842 05/31/21 2230 05/31/21 2300 06/01/21 0624 06/02/21 0600  PROCALCITON  --   --  0.30 0.22 0.20  LATICACIDVEN 2.1* 1.7  --   --   --     Recent Results (from the past 240 hour(s))  Resp Panel by RT-PCR (Flu A&B, Covid) Nasopharyngeal Swab     Status: None   Collection Time: 05/31/21  6:28 PM   Specimen: Nasopharyngeal Swab; Nasopharyngeal(NP) swabs in vial transport medium  Result Value Ref Range Status   SARS Coronavirus 2 by RT PCR NEGATIVE NEGATIVE Final    Comment: (NOTE) SARS-CoV-2 target nucleic acids are NOT DETECTED.  The SARS-CoV-2 RNA is generally detectable in upper respiratory specimens during the acute phase of infection. The lowest concentration of SARS-CoV-2 viral copies this assay can detect is 138 copies/mL. A negative result does not preclude SARS-Cov-2 infection and should not be used as the sole basis for treatment or other patient management decisions. A negative result may occur with  improper specimen collection/handling, submission of specimen other than nasopharyngeal swab, presence of viral mutation(s) within the areas targeted by this assay, and inadequate number of viral copies(<138 copies/mL). A negative result must be combined with clinical observations, patient history, and epidemiological information. The expected result is Negative.  Fact Sheet for Patients:  EntrepreneurPulse.com.au  Fact Sheet for Healthcare Providers:  IncredibleEmployment.be  This test is no t yet approved or cleared by the [place] FDA and  has been authorized for detection and/or diagnosis of SARS-CoV-2 by FDA under an Emergency Use Authorization (EUA). This EUA will remain  in effect (meaning this  test can be used) for the duration of the COVID-19 declaration under Section 564(b)(1) of the Act, 21 U.S.C.section 360bbb-3(b)(1), unless the authorization is terminated  or revoked sooner.       Influenza A by PCR NEGATIVE NEGATIVE Final   Influenza B by PCR NEGATIVE NEGATIVE Final    Comment: (NOTE) The Xpert Xpress SARS-CoV-2/FLU/RSV plus assay is intended as an aid in the diagnosis of influenza from Nasopharyngeal swab specimens and should not be used as a sole basis for treatment.  Nasal washings and aspirates are unacceptable for Xpert Xpress SARS-CoV-2/FLU/RSV testing.  Fact Sheet for Patients: EntrepreneurPulse.com.au  Fact Sheet for Healthcare Providers: IncredibleEmployment.be  This test is not yet approved or cleared by the [place] FDA and has been authorized for detection and/or diagnosis of SARS-CoV-2 by FDA under an Emergency Use Authorization (EUA). This EUA will remain in effect (meaning this test can be used) for the duration of the COVID-19 declaration under Section 564(b)(1) of the Act, 21 U.S.C. section 360bbb-3(b)(1), unless the authorization is terminated or revoked.  Performed at [facility], [place] [address] [place], [place] [address]   Urine Culture     Status: Abnormal   Collection Time: 05/31/21  8:00 PM   Specimen: Urine, Clean Catch  Result Value Ref Range Status   Specimen Description   Final    URINE, CLEAN CATCH Performed at [facility], [place] [address], [place], [place] [address]    Special Requests   Final    NONE Performed at [facility], [place] [address] [place], [place] [address]    Culture (A)  Final    <10,000 COLONIES/mL INSIGNIFICANT GROWTH Performed at [facility] [address], [place], [place] [address]    Report Status 06/02/2021 FINAL  Final  Blood culture (routine x 2)     Status: None (Preliminary result)    Collection Time: 05/31/21  8:00 PM   Specimen: Left Antecubital; Blood  Result Value Ref Range Status   Specimen Description   Final    LEFT ANTECUBITAL Performed at [facility] [address] [place], [place] [address]    Special Requests   Final    BOTTLES DRAWN AEROBIC AND ANAEROBIC Blood Culture results may not be optimal due to an inadequate volume of blood received in culture bottles Performed at [facility] [address], [place], [place] [address]    Culture   Final    NO GROWTH 3 DAYS Performed at [facility], [place] [address] [place], [place] [address]    Report Status PENDING  Incomplete  Blood culture (routine x 2)     Status: None (Preliminary result)   Collection Time: 05/31/21 11:00 PM   Specimen: BLOOD RIGHT ARM  Result Value Ref Range Status   Specimen Description   Final    BLOOD RIGHT ARM Performed at [facility] [address] [place], [place] [address]    Special Requests   Final    BOTTLES DRAWN AEROBIC ONLY Blood Culture adequate volume Performed at [facility] [address] [place], [place] [address]    Culture   Final    NO GROWTH 3 DAYS Performed at [facility], [place] [address], [place], [place] [address]    Report Status PENDING  Incomplete    Antimicrobials: Anti-infectives (From admission, onward)    Start     Dose/Rate Route Frequency Ordered Stop   06/01/21 2200  azithromycin (ZITHROMAX) tablet 500 mg        500 mg Oral Daily at bedtime 06/01/21 0952 06/05/21 2159   06/01/21 1930  cefTRIAXone (ROCEPHIN) 2 g in sodium chloride 0.9 % 100 mL IVPB        2 g 200 mL/hr over 30 Minutes Intravenous Every 24 hours 05/31/21 2218 06/05/21 1929   06/01/21 1930  azithromycin (ZITHROMAX) 500 mg in sodium chloride 0.9 % 250 mL IVPB  Status:  Discontinued  500 mg 250 mL/hr over 60 Minutes Intravenous Every 24 hours 05/31/21 2218 06/01/21 0952    05/31/21 1945  cefTRIAXone (ROCEPHIN) 1 g in sodium chloride 0.9 % 100 mL IVPB  Status:  Discontinued        1 g 200 mL/hr over 30 Minutes Intravenous  Once 05/31/21 1940 05/31/21 2001   05/31/21 1945  azithromycin (ZITHROMAX) 500 mg in sodium chloride 0.9 % 250 mL IVPB  Status:  Discontinued        500 mg 250 mL/hr over 60 Minutes Intravenous  Once 05/31/21 1940 05/31/21 2002   05/31/21 1930  cefTRIAXone (ROCEPHIN) 1 g in sodium chloride 0.9 % 100 mL IVPB        1 g 200 mL/hr over 30 Minutes Intravenous  Once 05/31/21 1923 05/31/21 2227   05/31/21 1930  azithromycin (ZITHROMAX) 500 mg in sodium chloride 0.9 % 250 mL IVPB        500 mg 250 mL/hr over 60 Minutes Intravenous  Once 05/31/21 1923 05/31/21 2226      Culture/Microbiology    Component Value Date/Time   SDES  05/31/2021 2300    BLOOD RIGHT ARM Performed at [facility], [place] [address], [place], [place] [address]    SPECREQUEST  05/31/2021 2300    BOTTLES DRAWN AEROBIC ONLY Blood Culture adequate volume Performed at [place] [address], [place], [place] [address]    CULT  05/31/2021 2300    NO GROWTH 3 DAYS Performed at [facility], [place] [address] [place], [place] [address]    REPTSTATUS PENDING 05/31/2021 2300    Other culture-see note   Radiology Studies: No results found.   LOS: 3 days   Antonieta Pert, MD [facility]  06/03/2021, 11:06 AM

## 2021-06-03 NOTE — TOC Progression Note (Signed)
Transition of Care [facility]) - Progression Note    Patient Details  Name: Cory Hansen MRN: [id number] Date of Birth: Aug 16, 1920  Transition of Care [facility]) CM/SW Contact  Larrie Kass, LCSW Phone Number: 06/03/2021, 11:50 AM  Clinical Narrative:     CSW presented bed offer to pt and family, they selected [facility]. CSW spoke with Olegario Messier pt can d/c tomorrow pending approved insurance Auth.   Firefighter pending.         Expected Discharge Plan and Services                                                 Social Determinants of Health (SDOH) Interventions    Readmission Risk Interventions No flowsheet data found.

## 2021-06-03 NOTE — NC FL2 (Signed)
[place] MEDICAID FL2 LEVEL OF CARE SCREENING TOOL     IDENTIFICATION  Patient Name: Cory Hansen Birthdate: Mar 29, 1921 Sex: male Admission Date (Current Location): 05/31/2021  [facility] and [place] Number:  Herbalist and Address:  [facility],  [place], [place]      Provider Number: [id number]  Attending Physician Name and Address:  Antonieta Pert, MD  Relative Name and Phone Number:       Current Level of Care: Hospital Recommended Level of Care: [place] Prior Approval Number:    Date Approved/Denied:   PASRR Number: [id number]  Discharge Plan: SNF    Current Diagnoses: Patient Active Problem List   Diagnosis Date Noted   Constipation 06/02/2021   Acute respiratory failure with hypoxia ([place]) 06/01/2021   Sepsis due to pneumonia ([place]) 05/31/2021   Normocytic anemia 05/31/2021   Stage 3a chronic kidney disease (CKD) ([place]) 05/31/2021   Acute encephalopathy 05/31/2021   Dementia ([place]) 10/21/2020   Choledocholithiasis 10/20/2020   Chronic diastolic CHF (congestive heart failure) ([place]) 06/20/2020   Permanent atrial fibrillation ([place]) 06/19/2020   Hypokalemia 03/14/2020   Acute right ankle pain 05/07/2019   Diarrhea 10/27/2015   Urinary dribbling 07/26/2015   Routine general medical examination at a health care facility 10/13/2014   CT, CHEST, ABNORMAL 01/23/2009   Chronic bilateral low back pain with right-sided sciatica 01/17/2009   NEOPLASM, MALIGNANT, BLADDER, HX OF 01/17/2009   Hx of benign neoplasm of prostate 01/17/2009   Hyperlipidemia 01/16/2009   ARTHRITIS 01/16/2009   Weakness 01/16/2009    Orientation RESPIRATION BLADDER Height & Weight     Self, Place  Normal Incontinent, External catheter Weight: 172 lb 13.5 oz (78.4 kg) Height:  5\' 11"  (180.3 cm)  BEHAVIORAL SYMPTOMS/MOOD NEUROLOGICAL BOWEL NUTRITION STATUS      Incontinent Diet  AMBULATORY STATUS COMMUNICATION OF NEEDS Skin   Limited  Assist Verbally Normal                       Personal Care Assistance Level of Assistance  Bathing, Feeding, Dressing Bathing Assistance: Maximum assistance Feeding assistance: Independent Dressing Assistance: Maximum assistance     Functional Limitations Info  Sight, Hearing, Speech Sight Info: Adequate Hearing Info: Adequate Speech Info: Adequate    SPECIAL CARE FACTORS FREQUENCY  PT (By licensed PT), OT (By licensed OT)     PT Frequency: 5 x a week OT Frequency: 5 x a week            Contractures Contractures Info: Not present    Additional Factors Info  Code Status, Allergies Code Status Info: DNR Allergies Info: NKA           Current Medications (06/03/2021):  This is the current hospital active medication list Current Facility-Administered Medications  Medication Dose Route Frequency Provider Last Rate Last Admin   acetaminophen (TYLENOL) tablet 650 mg  650 mg Oral Q6H PRN Opyd, Ilene Qua, MD       Or   acetaminophen (TYLENOL) suppository 650 mg  650 mg Rectal Q6H PRN Opyd, Ilene Qua, MD       albuterol (PROVENTIL) (2.5 MG/3ML) 0.083% nebulizer solution 2.5 mg  2.5 mg Nebulization BID Opyd, Ilene Qua, MD   2.5 mg at 06/03/21 0730   apixaban (ELIQUIS) tablet 5 mg  5 mg Oral BID Opyd, Ilene Qua, MD   5 mg at 06/03/21 0923   azithromycin (ZITHROMAX) tablet 500 mg  500 mg Oral  QHS Leodis Sias T, RPH   500 mg at 06/02/21 2141   bisacodyl (DULCOLAX) suppository 10 mg  10 mg Rectal Once Kc, Maren [place], MD       cefTRIAXone (ROCEPHIN) 2 g in sodium chloride 0.9 % 100 mL IVPB  2 g Intravenous Q24H Opyd, Ilene Qua, MD   Stopped at 06/02/21 2300   docusate sodium (COLACE) capsule 100 mg  100 mg Oral QHS Kc, Maren [place], MD   100 mg at 06/02/21 2141   feeding supplement (ENSURE ENLIVE / ENSURE PLUS) liquid 237 mL  237 mL Oral BID BM Kc, Ramesh, MD   237 mL at 06/03/21 0919   melatonin tablet 3 mg  3 mg Oral QHS Kc, Ramesh, MD   3 mg at 06/02/21 2141   pantoprazole  (PROTONIX) EC tablet 40 mg  40 mg Oral Daily Opyd, Ilene Qua, MD   40 mg at 06/03/21 [id number]   polyethylene glycol (MIRALAX / GLYCOLAX) packet 17 g  17 g Oral Daily Kc, Ramesh, MD       QUEtiapine (SEROQUEL) tablet 12.5 mg  12.5 mg Oral QHS Kc, Ramesh, MD   12.5 mg at 06/02/21 2141   senna-docusate (Senokot-S) tablet 1 tablet  1 tablet Oral QHS PRN Opyd, Ilene Qua, MD       tamsulosin (FLOMAX) capsule 0.4 mg  0.4 mg Oral QHS Opyd, Ilene Qua, MD   0.4 mg at 06/02/21 2141     Discharge Medications: Please see discharge summary for a list of discharge medications.  Relevant Imaging Results:  Relevant Lab Results:   Additional Information SSN [id number]  COVID x2  Arlie Solomons Percilla Tweten, LCSW

## 2021-06-03 NOTE — TOC Progression Note (Signed)
Transition of Care [facility]) - Progression Note    Patient Details  Name: Cory Hansen MRN: [id number] Date of Birth: 09-12-1920  Transition of Care [facility]) CM/SW Contact  Larrie Kass, LCSW Phone Number: 06/03/2021, 10:02 AM  Clinical Narrative:    TOC CSW spoke with pt's daughter [name] in regards to PT Eval . Pt's daughter agreed with recommendations and to send pt's information out to SNF. CSW to completed FL2 and send pt's information out.         Expected Discharge Plan and Services                                                 Social Determinants of Health (SDOH) Interventions    Readmission Risk Interventions No flowsheet data found.

## 2021-06-04 DIAGNOSIS — K5900 Constipation, unspecified: Secondary | ICD-10-CM | POA: Diagnosis not present

## 2021-06-04 DIAGNOSIS — J189 Pneumonia, unspecified organism: Secondary | ICD-10-CM | POA: Diagnosis not present

## 2021-06-04 DIAGNOSIS — M6281 Muscle weakness (generalized): Secondary | ICD-10-CM | POA: Diagnosis not present

## 2021-06-04 DIAGNOSIS — I5032 Chronic diastolic (congestive) heart failure: Secondary | ICD-10-CM | POA: Diagnosis not present

## 2021-06-04 DIAGNOSIS — Z8551 Personal history of malignant neoplasm of bladder: Secondary | ICD-10-CM | POA: Diagnosis not present

## 2021-06-04 DIAGNOSIS — E46 Unspecified protein-calorie malnutrition: Secondary | ICD-10-CM | POA: Diagnosis not present

## 2021-06-04 DIAGNOSIS — R531 Weakness: Secondary | ICD-10-CM | POA: Diagnosis not present

## 2021-06-04 DIAGNOSIS — M129 Arthropathy, unspecified: Secondary | ICD-10-CM | POA: Diagnosis not present

## 2021-06-04 DIAGNOSIS — J9601 Acute respiratory failure with hypoxia: Secondary | ICD-10-CM | POA: Diagnosis not present

## 2021-06-04 DIAGNOSIS — G9341 Metabolic encephalopathy: Secondary | ICD-10-CM | POA: Diagnosis not present

## 2021-06-04 DIAGNOSIS — N1831 Chronic kidney disease, stage 3a: Secondary | ICD-10-CM | POA: Diagnosis not present

## 2021-06-04 DIAGNOSIS — H353134 Nonexudative age-related macular degeneration, bilateral, advanced atrophic with subfoveal involvement: Secondary | ICD-10-CM | POA: Diagnosis not present

## 2021-06-04 DIAGNOSIS — A419 Sepsis, unspecified organism: Secondary | ICD-10-CM | POA: Diagnosis not present

## 2021-06-04 DIAGNOSIS — Z8616 Personal history of COVID-19: Secondary | ICD-10-CM | POA: Diagnosis not present

## 2021-06-04 DIAGNOSIS — Z7401 Bed confinement status: Secondary | ICD-10-CM | POA: Diagnosis not present

## 2021-06-04 DIAGNOSIS — M5441 Lumbago with sciatica, right side: Secondary | ICD-10-CM | POA: Diagnosis not present

## 2021-06-04 DIAGNOSIS — G8929 Other chronic pain: Secondary | ICD-10-CM | POA: Diagnosis not present

## 2021-06-04 DIAGNOSIS — Z1159 Encounter for screening for other viral diseases: Secondary | ICD-10-CM | POA: Diagnosis not present

## 2021-06-04 DIAGNOSIS — S90415A Abrasion, left lesser toe(s), initial encounter: Secondary | ICD-10-CM | POA: Diagnosis not present

## 2021-06-04 DIAGNOSIS — J328 Other chronic sinusitis: Secondary | ICD-10-CM | POA: Diagnosis not present

## 2021-06-04 DIAGNOSIS — R2689 Other abnormalities of gait and mobility: Secondary | ICD-10-CM | POA: Diagnosis not present

## 2021-06-04 DIAGNOSIS — I4821 Permanent atrial fibrillation: Secondary | ICD-10-CM | POA: Diagnosis not present

## 2021-06-04 DIAGNOSIS — D509 Iron deficiency anemia, unspecified: Secondary | ICD-10-CM | POA: Diagnosis not present

## 2021-06-04 DIAGNOSIS — K219 Gastro-esophageal reflux disease without esophagitis: Secondary | ICD-10-CM | POA: Diagnosis not present

## 2021-06-04 LAB — LEGIONELLA PNEUMOPHILA SEROGP 1 UR AG: L. pneumophila Serogp 1 Ur Ag: NEGATIVE

## 2021-06-04 MED ORDER — AZITHROMYCIN 500 MG PO TABS: 500.0000 mg | ORAL_TABLET | Freq: Every day | ORAL | Status: AC

## 2021-06-04 MED ORDER — CEFDINIR 300 MG PO CAPS: 300.0000 mg | ORAL_CAPSULE | Freq: Two times a day (BID) | ORAL | Status: AC

## 2021-06-04 MED ORDER — POLYETHYLENE GLYCOL 3350 17 G PO PACK: 17.0000 g | PACK | Freq: Every day | ORAL | 0 refills | Status: AC

## 2021-06-04 MED ORDER — MELATONIN 3 MG PO TABS: 3.0000 mg | ORAL_TABLET | Freq: Every day | ORAL | 0 refills | Status: DC

## 2021-06-04 MED ORDER — ENSURE ENLIVE PO LIQD: 237.0000 mL | Freq: Two times a day (BID) | ORAL | 12 refills | Status: DC

## 2021-06-04 MED ORDER — QUETIAPINE FUMARATE 25 MG PO TABS: 12.5000 mg | ORAL_TABLET | Freq: Every day | ORAL | Status: DC

## 2021-06-04 MED ORDER — CEFDINIR 300 MG PO CAPS: 300.0000 mg | ORAL_CAPSULE | Freq: Two times a day (BID) | ORAL | Status: DC

## 2021-06-04 MED ORDER — SENNOSIDES-DOCUSATE SODIUM 8.6-50 MG PO TABS: 1.0000 | ORAL_TABLET | Freq: Every evening | ORAL | Status: DC | PRN

## 2021-06-04 NOTE — Progress Notes (Signed)
Report given to [place] @ [facility]. IV has been removed. Gown and linen changed per protocol. Incontinence brief placed on patient. Male purewick has been discontinued. No signs or symptoms of acute distress noted or voiced by patient.

## 2021-06-04 NOTE — TOC Transition Note (Signed)
Transition of Care [facility]) - CM/SW Discharge Note   Patient Details  Name: Cory Hansen MRN: [id number] Date of Birth: 04-17-1920  Transition [facility] Care [facility]) CM/SW Contact:  Amada Jupiter, LCSW Phone Number: 06/04/2021, 1:09 PM   Clinical Narrative:    Pt medically cleared for dc today to SNF and insurance authorization received [phone]).  Pt and daughter aware/ agreeable.  PTAR called at 1:05pm.  RN to call report to [phone].  No further TOC needs.   Final next level of care: Skilled Nursing Facility Barriers to Discharge: Barriers Resolved   Patient Goals and CMS Choice        Discharge Placement              Patient [facility] bed at: [facility] Patient to be transferred to facility by: PTAR Name of family member notified: daughter Patient and family notified of of transfer: 06/04/21  Discharge Plan and Services                DME Arranged: N/A DME Agency: NA                  Social Determinants of Health (SDOH) Interventions     Readmission Risk Interventions No flowsheet data found.

## 2021-06-04 NOTE — TOC Progression Note (Signed)
Transition of Care [facility]) - Progression Note    Patient Details  Name: Cory Hansen MRN: [id number] Date of Birth: [birth year]  Transition of Care [facility]) CM/SW Contact  Amada Jupiter, LCSW Phone Number: 06/04/2021, 10:51 AM  Clinical Narrative:    Continue to await insurance authorization for SNF - hope to have this today.        Expected Discharge Plan and Services           Expected Discharge Date: 06/04/21                                     Social Determinants of Health (SDOH) Interventions    Readmission Risk Interventions No flowsheet data found.

## 2021-06-04 NOTE — Discharge Summary (Signed)
Physician Discharge Summary   Patient: Cory Hansen MRN: [id number] DOB: 08-16-1920  Admit date:     05/31/2021  Discharge date: 06/04/21  Discharge Physician: Antonieta Pert   PCP: Hoyt Koch, MD   Recommendations at discharge:   PC follow-up in 1 month CBC BMP in 1 week chest x-ray in 4 weeks  Hospital Course: 88 y.o. male with medical history significant for atrial fibrillation on Eliquis, CKD 3A, chronic diastolic CHF, and dementia, now presenting to the emergency department accompanied by daughter who is assisting with history, patient with increased confusion, productive cough, and shortness of breath. He lives at home with 24-hour supervision from his family and has been noted to have a cough that is worsened over the past 3 weeks, becoming more productive of thick dark sputum, and associated with worsening shortness of breath and 2 weeks of increased confusion and visual hallucinations. In the ED vitals stable saturating 80s on room air tachypneic chest x-ray with no acute peribronchial thickening and patchy opacity in the left midlung likely small pleural effusion Labs showed leukocytosis initial lactic acid 2.1 COVID-19 PCR and influenza negative blood cultures collected placed on IV antibiotics and admitted For sepsis POA due to pneumonia/acute hypoxic aspiratory failure, acute metabolic encephalopathy in the setting of dementia, A-fib CKD anemia-chronic diastolic CHF. Seen by physical therapy Occupational Therapy and has recommended skilled nursing facility, family wanting placement as well. At this time patient is clinically improving, off oxygen having bowel movement, mental status is stable.Awaiting on placement.  Patient remains medically stable   Discharge Diagnoses: Principal Problem:   Sepsis due to pneumonia [facility]) Active Problems:   Acute respiratory failure with hypoxia (HCC)   Permanent atrial fibrillation (HCC)   Chronic diastolic CHF (congestive heart  failure) (HCC)   Normocytic anemia   Stage 3a chronic kidney disease (CKD) (HCC)   Acute encephalopathy   Constipation * Sepsis due to pneumonia ([place])- (present on admission) Sepsis due to pneumonia-community-acquired Acute hypoxic respiratory failure: Hypoxic in 80s on RA in the ED and met sepsis criteria with leukocytosis tachypnea. Clinically improved off oxygen leukocytosis resolved.  Culture data unyielding. Treated with ceftriaxone azithromycin-we will change to oral antibiotics on discharge.Repeat chest x-ray in 4 weeks and follow-up with PCP as outpatient Recent Labs  Lab 05/31/21 1814 05/31/21 1842 05/31/21 2230 05/31/21 2300 06/01/21 0624 06/02/21 0600  WBC 12.2*  --   --   --  9.6  --   LATICACIDVEN  --  2.1* 1.7  --   --   --   PROCALCITON  --   --   --  0.30 0.22 0.20    Acute respiratory failure with hypoxia (HCC) On room air now  Constipation Having bowel movement here continue with laxatives  Acute encephalopathy- (present on admission) Acute Metabolic encephalopathy with dementia At this time fairly stable mental status.  Baseline hearing difficulties and memory problems patient's family member taking down to help with this memory issues during the night which seems to have helped.  Is responding well to low-dose Seroquel and melatonin during night.    Stage 3a chronic kidney disease (CKD) ([place])- (present on admission) Renal function is stable at baseline Recent Labs  Lab 05/31/21 1814 06/01/21 0624 06/02/21 0600 06/03/21 0623  BUN '20 18 19 19  ' CREATININE 1.18 1.00 0.93 1.01    Normocytic anemia- (present on admission) Monitor hemoglobin 8-9 gm. Recent Labs  Lab 05/31/21 1814 06/01/21 0624  HGB 9.4* 8.9*  HCT 32.5* 30.4*  Chronic diastolic CHF (congestive heart failure) ([place])- (present on admission) Last echo in March 2020, euvolemic.  Holding Lasix due to lactic acidosis and elevated BNP on admission.  Resume upon discharge  Permanent  atrial fibrillation ([place])- (present on admission) On Eliquis, patient no longer on metoprolol due to hypotension.  Monitor           Consultants: toc Procedures performed: none  Disposition: Skilled nursing facility Diet recommendation:  Diet Orders (From admission, onward)     Start     Ordered   05/31/21 2218  Diet Heart Room service appropriate? Yes; Fluid consistency: Thin  Diet effective now       Question Answer Comment  Room service appropriate? Yes   Fluid consistency: Thin      05/31/21 2218             DISCHARGE MEDICATION: Allergies as of 06/04/2021   No Known Allergies      Medication List     STOP taking these medications    metoprolol tartrate 25 MG tablet Commonly known as: LOPRESSOR       TAKE these medications    azithromycin 500 MG tablet Commonly known as: ZITHROMAX Take 1 tablet (500 mg total) by mouth daily for 1 day.   cefdinir 300 MG capsule Commonly known as: OMNICEF Take 1 capsule (300 mg total) by mouth 2 (two) times daily for 4 days.   docusate sodium 100 MG capsule Commonly known as: COLACE Take 100 mg by mouth at bedtime.   Eliquis 5 MG Tabs tablet Generic drug: apixaban TAKE 1 TABLET BY MOUTH  TWICE DAILY What changed: how much to take   feeding supplement Liqd Take 237 mLs by mouth 2 (two) times daily between meals.   ferrous sulfate 325 (65 FE) MG tablet Take 325 mg by mouth daily with breakfast.   furosemide 20 MG tablet Commonly known as: LASIX Take 20 mg by mouth daily as needed for fluid.   GLUCOSAMINE-CHONDROITIN PLUS PO Take 1 tablet by mouth daily.   melatonin 3 MG Tabs tablet Take 1 tablet (3 mg total) by mouth at bedtime.   pantoprazole 40 MG tablet Commonly known as: PROTONIX TAKE 1 TABLET BY MOUTH  DAILY   polyethylene glycol 17 g packet Commonly known as: MIRALAX / GLYCOLAX Take 17 g by mouth daily.   QUEtiapine 25 MG tablet Commonly known as: SEROQUEL Take 0.5 tablets (12.5 mg  total) by mouth at bedtime.   senna-docusate 8.6-50 MG tablet Commonly known as: Senokot-S Take 1 tablet by mouth at bedtime as needed for mild constipation.   tamsulosin 0.4 MG Caps capsule Commonly known as: FLOMAX Take 0.4 mg by mouth at bedtime.        Follow-up Information     Hoyt Koch, MD Follow up in 1 week(s).   Specialty: Internal Medicine Why: Chest x-ray in 4-week, CBC BMP in 1 week Contact information: [place] [address] [phone]                 Discharge Exam: Danley Danker Weights   05/31/21 1747 05/31/21 2306  Weight: 78 kg 78.4 kg  Condition at discharge: fair  The results of significant diagnostics from this hospitalization (including imaging, microbiology, ancillary and laboratory) are listed below for reference.   Imaging Studies: CT HEAD WO CONTRAST (5MM)  Result Date: 05/31/2021 CLINICAL DATA:  Altered mental status. EXAM: CT HEAD WITHOUT CONTRAST TECHNIQUE: Contiguous axial images were obtained from the base of  the skull through the vertex without intravenous contrast. RADIATION DOSE REDUCTION: This exam was performed according to the departmental dose-optimization program which includes automated exposure control, adjustment of the mA and/or kV according to patient size and/or use of iterative reconstruction technique. COMPARISON:  Head CT dated 06/30/2020. FINDINGS: Brain: Mild age-related atrophy and chronic microvascular ischemic changes. There is no acute intracranial hemorrhage. No mass effect or midline shift. No extra-axial fluid collection. Vascular: No hyperdense vessel or unexpected calcification. Skull: Normal. Negative for fracture or focal lesion. Sinuses/Orbits: Diffuse mucoperiosteal thickening of paranasal sinuses with opacification of several ethmoid air cells as well as partial opacification of the right maxillary sinus. No air-fluid level. The mastoid air cells are clear. Other: None IMPRESSION: 1. No  acute intracranial pathology. 2. Mild age-related atrophy and chronic microvascular ischemic changes. 3. Chronic paranasal sinus disease. Electronically Signed   By: Anner Crete M.D.   On: 05/31/2021 19:12   DG Chest Port 1 View  Result Date: 05/31/2021 CLINICAL DATA:  Shortness of breath.  Productive cough. EXAM: PORTABLE CHEST 1 VIEW COMPARISON:  Most recent radiograph 04/02/2021. FINDINGS: Lower lung volumes from prior exam. Stable heart size and mediastinal contours. Aortic atherosclerosis. Diffuse bilateral calcified pleural plaques. Suspected small bilateral pleural effusions. Increased peribronchial thickening. No pneumothorax. There may be patchy opacity in the left mid lung. IMPRESSION: 1. Lower lung volumes from prior exam. Increased peribronchial thickening may be bronchitis or pulmonary edema. 2. Patchy opacity in the left mid lung may represent pneumonia in the appropriate clinical setting. 3. Suspected small bilateral pleural effusions. 4. Chronic calcified pleural plaques. Electronically Signed   By: Keith Rake M.D.   On: 05/31/2021 19:03    Microbiology: Results for orders placed or performed during the hospital encounter of 05/31/21  Resp Panel by RT-PCR (Flu A&B, Covid) Nasopharyngeal Swab     Status: None   Collection Time: 05/31/21  6:28 PM   Specimen: Nasopharyngeal Swab; Nasopharyngeal(NP) swabs in vial transport medium  Result Value Ref Range Status   SARS Coronavirus 2 by RT PCR NEGATIVE NEGATIVE Final    Comment: (NOTE) SARS-CoV-2 target nucleic acids are NOT DETECTED.  The SARS-CoV-2 RNA is generally detectable in upper respiratory specimens during the acute phase of infection. The lowest concentration of SARS-CoV-2 viral copies this assay can detect is 138 copies/mL. A negative result does not preclude SARS-Cov-2 infection and should not be used as the sole basis for treatment or other patient management decisions. A negative result may occur with  improper  specimen collection/handling, submission of specimen other than nasopharyngeal swab, presence of viral mutation(s) within the areas targeted by this assay, and inadequate number of viral copies(<138 copies/mL). A negative result must be combined with clinical observations, patient history, and epidemiological information. The expected result is Negative.  Fact Sheet for Patients:  EntrepreneurPulse.com.au  Fact Sheet for Healthcare Providers:  IncredibleEmployment.be  This test is no t yet approved or cleared by the [place] FDA and  has been authorized for detection and/or diagnosis of SARS-CoV-2 by FDA under an Emergency Use Authorization (EUA). This EUA will remain  in effect (meaning this test can be used) for the duration of the COVID-19 declaration under Section 564(b)(1) of the Act, 21 U.S.C.section 360bbb-3(b)(1), unless the authorization is terminated  or revoked sooner.       Influenza A by PCR NEGATIVE NEGATIVE Final   Influenza B by PCR NEGATIVE NEGATIVE Final    Comment: (NOTE) The Xpert Xpress SARS-CoV-2/FLU/RSV plus assay is intended  as an aid in the diagnosis of influenza from Nasopharyngeal swab specimens and should not be used as a sole basis for treatment. Nasal washings and aspirates are unacceptable for Xpert Xpress SARS-CoV-2/FLU/RSV testing.  Fact Sheet for Patients: EntrepreneurPulse.com.au  Fact Sheet for Healthcare Providers: IncredibleEmployment.be  This test is not yet approved or cleared by the [place] FDA and has been authorized for detection and/or diagnosis of SARS-CoV-2 by FDA under an Emergency Use Authorization (EUA). This EUA will remain in effect (meaning this test can be used) for the duration of the COVID-19 declaration under Section 564(b)(1) of the Act, 21 U.S.C. section 360bbb-3(b)(1), unless the authorization is terminated or revoked.  Performed at  [facility], [place] [address] [place], [place] [address]   Urine Culture     Status: Abnormal   Collection Time: 05/31/21  8:00 PM   Specimen: Urine, Clean Catch  Result Value Ref Range Status   Specimen Description   Final    URINE, CLEAN CATCH Performed at [facility], [place] [address], [place], [place] [address]    Special Requests   Final    NONE Performed at [facility], [place] [address], [place], [place] [address]    Culture (A)  Final    <10,000 COLONIES/mL INSIGNIFICANT GROWTH Performed at [facility] [address], [place], [place] [address]    Report Status 06/02/2021 FINAL  Final  Blood culture (routine x 2)     Status: None (Preliminary result)   Collection Time: 05/31/21  8:00 PM   Specimen: Left Antecubital; Blood  Result Value Ref Range Status   Specimen Description   Final    LEFT ANTECUBITAL Performed at [facility] [address], [place], [place] [address]    Special Requests   Final    BOTTLES DRAWN AEROBIC AND ANAEROBIC Blood Culture results may not be optimal due to an inadequate volume of blood received in culture bottles Performed at [facility] [address], [place], [place] [address]    Culture   Final    NO GROWTH 4 DAYS Performed at [facility], [place] [address], [place], [place] [address]    Report Status PENDING  Incomplete  Blood culture (routine x 2)     Status: None (Preliminary result)   Collection Time: 05/31/21 11:00 PM   Specimen: BLOOD RIGHT ARM  Result Value Ref Range Status   Specimen Description   Final    BLOOD RIGHT ARM Performed at [facility] [address] [place], [place] [address]    Special Requests   Final    BOTTLES DRAWN AEROBIC ONLY Blood Culture adequate volume Performed at [facility] [address], [place], [place] [address]    Culture   Final    NO GROWTH 4  DAYS Performed at [facility], [place] [address] [place], [place] [address]    Report Status PENDING  Incomplete    Labs: CBC: Recent Labs  Lab 05/31/21 1814 06/01/21 0624  WBC 12.2* 9.6  NEUTROABS 9.2*  --   HGB 9.4* 8.9*  HCT 32.5* 30.4*  MCV 90.5 89.4  PLT 268 169   Basic Metabolic Panel: Recent Labs  Lab 05/31/21 1814 06/01/21 0624 06/02/21 0600 06/03/21 0623  NA 138 140 139 141  K 4.6 4.1 4.0 4.3  CL 104 107 107 107  CO2 '27 24 26 28  ' GLUCOSE 93 94 100*  101*  BUN '20 18 19 19  ' CREATININE 1.18 1.00 0.93 1.01  CALCIUM 8.8* 8.6* 8.3* 8.8*   Liver Function Tests: Recent Labs  Lab 05/31/21 1814  AST 27  ALT 23  ALKPHOS 89  BILITOT 0.5  PROT 6.8  ALBUMIN 3.6   CBG: No results for input(s): GLUCAP in the last 168 hours.  Discharge time spent: 35 minutes.  Signed: Antonieta Pert, MD [facility] 06/04/2021

## 2021-06-04 NOTE — Plan of Care (Signed)

## 2021-06-05 LAB — CULTURE, BLOOD (ROUTINE X 2)
Culture: NO GROWTH
Culture: NO GROWTH
Special Requests: ADEQUATE

## 2021-06-07 DIAGNOSIS — K219 Gastro-esophageal reflux disease without esophagitis: Secondary | ICD-10-CM | POA: Diagnosis not present

## 2021-06-07 DIAGNOSIS — N1831 Chronic kidney disease, stage 3a: Secondary | ICD-10-CM | POA: Diagnosis not present

## 2021-06-07 DIAGNOSIS — K5900 Constipation, unspecified: Secondary | ICD-10-CM | POA: Diagnosis not present

## 2021-06-07 DIAGNOSIS — G8929 Other chronic pain: Secondary | ICD-10-CM | POA: Diagnosis not present

## 2021-06-07 DIAGNOSIS — M129 Arthropathy, unspecified: Secondary | ICD-10-CM | POA: Diagnosis not present

## 2021-06-07 DIAGNOSIS — Z1159 Encounter for screening for other viral diseases: Secondary | ICD-10-CM | POA: Diagnosis not present

## 2021-06-07 DIAGNOSIS — J9601 Acute respiratory failure with hypoxia: Secondary | ICD-10-CM | POA: Diagnosis not present

## 2021-06-07 DIAGNOSIS — M6281 Muscle weakness (generalized): Secondary | ICD-10-CM | POA: Diagnosis not present

## 2021-06-07 DIAGNOSIS — I5032 Chronic diastolic (congestive) heart failure: Secondary | ICD-10-CM | POA: Diagnosis not present

## 2021-06-07 DIAGNOSIS — D509 Iron deficiency anemia, unspecified: Secondary | ICD-10-CM | POA: Diagnosis not present

## 2021-06-07 DIAGNOSIS — G9341 Metabolic encephalopathy: Secondary | ICD-10-CM | POA: Diagnosis not present

## 2021-06-07 DIAGNOSIS — M5441 Lumbago with sciatica, right side: Secondary | ICD-10-CM | POA: Diagnosis not present

## 2021-06-08 DIAGNOSIS — R2689 Other abnormalities of gait and mobility: Secondary | ICD-10-CM | POA: Diagnosis not present

## 2021-06-18 DIAGNOSIS — K219 Gastro-esophageal reflux disease without esophagitis: Secondary | ICD-10-CM | POA: Diagnosis not present

## 2021-06-18 DIAGNOSIS — J9601 Acute respiratory failure with hypoxia: Secondary | ICD-10-CM | POA: Diagnosis not present

## 2021-06-18 DIAGNOSIS — M129 Arthropathy, unspecified: Secondary | ICD-10-CM | POA: Diagnosis not present

## 2021-06-18 DIAGNOSIS — M5441 Lumbago with sciatica, right side: Secondary | ICD-10-CM | POA: Diagnosis not present

## 2021-06-18 DIAGNOSIS — I5032 Chronic diastolic (congestive) heart failure: Secondary | ICD-10-CM | POA: Diagnosis not present

## 2021-06-18 DIAGNOSIS — S90415A Abrasion, left lesser toe(s), initial encounter: Secondary | ICD-10-CM | POA: Diagnosis not present

## 2021-06-18 DIAGNOSIS — R2689 Other abnormalities of gait and mobility: Secondary | ICD-10-CM | POA: Diagnosis not present

## 2021-06-18 DIAGNOSIS — M6281 Muscle weakness (generalized): Secondary | ICD-10-CM | POA: Diagnosis not present

## 2021-06-18 DIAGNOSIS — N1831 Chronic kidney disease, stage 3a: Secondary | ICD-10-CM | POA: Diagnosis not present

## 2021-06-18 DIAGNOSIS — G8929 Other chronic pain: Secondary | ICD-10-CM | POA: Diagnosis not present

## 2021-06-18 DIAGNOSIS — K5900 Constipation, unspecified: Secondary | ICD-10-CM | POA: Diagnosis not present

## 2021-06-18 DIAGNOSIS — G9341 Metabolic encephalopathy: Secondary | ICD-10-CM | POA: Diagnosis not present

## 2021-06-18 DIAGNOSIS — D509 Iron deficiency anemia, unspecified: Secondary | ICD-10-CM | POA: Diagnosis not present

## 2021-06-19 DIAGNOSIS — K5900 Constipation, unspecified: Secondary | ICD-10-CM | POA: Diagnosis not present

## 2021-06-19 DIAGNOSIS — G9341 Metabolic encephalopathy: Secondary | ICD-10-CM | POA: Diagnosis not present

## 2021-06-19 DIAGNOSIS — I130 Hypertensive heart and chronic kidney disease with heart failure and stage 1 through stage 4 chronic kidney disease, or unspecified chronic kidney disease: Secondary | ICD-10-CM | POA: Diagnosis not present

## 2021-06-19 DIAGNOSIS — H547 Unspecified visual loss: Secondary | ICD-10-CM | POA: Diagnosis not present

## 2021-06-19 DIAGNOSIS — Z7901 Long term (current) use of anticoagulants: Secondary | ICD-10-CM | POA: Diagnosis not present

## 2021-06-19 DIAGNOSIS — A419 Sepsis, unspecified organism: Secondary | ICD-10-CM | POA: Diagnosis not present

## 2021-06-19 DIAGNOSIS — N1831 Chronic kidney disease, stage 3a: Secondary | ICD-10-CM | POA: Diagnosis not present

## 2021-06-19 DIAGNOSIS — I5032 Chronic diastolic (congestive) heart failure: Secondary | ICD-10-CM | POA: Diagnosis not present

## 2021-06-19 DIAGNOSIS — J329 Chronic sinusitis, unspecified: Secondary | ICD-10-CM | POA: Diagnosis not present

## 2021-06-19 DIAGNOSIS — Z9181 History of falling: Secondary | ICD-10-CM | POA: Diagnosis not present

## 2021-06-19 DIAGNOSIS — H3530 Unspecified macular degeneration: Secondary | ICD-10-CM | POA: Diagnosis not present

## 2021-06-19 DIAGNOSIS — I4821 Permanent atrial fibrillation: Secondary | ICD-10-CM | POA: Diagnosis not present

## 2021-06-19 DIAGNOSIS — F0392 Unspecified dementia, unspecified severity, with psychotic disturbance: Secondary | ICD-10-CM | POA: Diagnosis not present

## 2021-06-19 DIAGNOSIS — D631 Anemia in chronic kidney disease: Secondary | ICD-10-CM | POA: Diagnosis not present

## 2021-06-19 DIAGNOSIS — J9601 Acute respiratory failure with hypoxia: Secondary | ICD-10-CM | POA: Diagnosis not present

## 2021-06-20 DIAGNOSIS — N179 Acute kidney failure, unspecified: Secondary | ICD-10-CM | POA: Diagnosis not present

## 2021-06-20 DIAGNOSIS — E876 Hypokalemia: Secondary | ICD-10-CM | POA: Diagnosis not present

## 2021-06-20 DIAGNOSIS — I480 Paroxysmal atrial fibrillation: Secondary | ICD-10-CM | POA: Diagnosis not present

## 2021-06-20 DIAGNOSIS — M6281 Muscle weakness (generalized): Secondary | ICD-10-CM | POA: Diagnosis not present

## 2021-06-21 ENCOUNTER — Telehealth: Payer: Self-pay

## 2021-06-21 NOTE — Telephone Encounter (Signed)
Cory Hansen is calling requesting Verbal orders for start of care for Medication management. ? ?1 time a week for 4 weeks ? ?Pt is currently [employer]. ? [place] [phone] ? ? ?

## 2021-06-22 NOTE — Telephone Encounter (Signed)
Fine for verbals °

## 2021-06-22 NOTE — Telephone Encounter (Signed)
Called Cory Hansen. LDVM at [phone] with verbal orders. Office number was provided.  ?

## 2021-06-25 DIAGNOSIS — M2041 Other hammer toe(s) (acquired), right foot: Secondary | ICD-10-CM | POA: Diagnosis not present

## 2021-06-25 DIAGNOSIS — L603 Nail dystrophy: Secondary | ICD-10-CM | POA: Diagnosis not present

## 2021-06-25 DIAGNOSIS — I7091 Generalized atherosclerosis: Secondary | ICD-10-CM | POA: Diagnosis not present

## 2021-06-25 DIAGNOSIS — M2042 Other hammer toe(s) (acquired), left foot: Secondary | ICD-10-CM | POA: Diagnosis not present

## 2021-06-26 DIAGNOSIS — F0392 Unspecified dementia, unspecified severity, with psychotic disturbance: Secondary | ICD-10-CM | POA: Diagnosis not present

## 2021-06-26 DIAGNOSIS — K5900 Constipation, unspecified: Secondary | ICD-10-CM | POA: Diagnosis not present

## 2021-06-26 DIAGNOSIS — N1831 Chronic kidney disease, stage 3a: Secondary | ICD-10-CM | POA: Diagnosis not present

## 2021-06-26 DIAGNOSIS — J9601 Acute respiratory failure with hypoxia: Secondary | ICD-10-CM | POA: Diagnosis not present

## 2021-06-26 DIAGNOSIS — I4821 Permanent atrial fibrillation: Secondary | ICD-10-CM | POA: Diagnosis not present

## 2021-06-26 DIAGNOSIS — G9341 Metabolic encephalopathy: Secondary | ICD-10-CM | POA: Diagnosis not present

## 2021-06-26 DIAGNOSIS — I130 Hypertensive heart and chronic kidney disease with heart failure and stage 1 through stage 4 chronic kidney disease, or unspecified chronic kidney disease: Secondary | ICD-10-CM | POA: Diagnosis not present

## 2021-06-26 DIAGNOSIS — Z9181 History of falling: Secondary | ICD-10-CM | POA: Diagnosis not present

## 2021-06-26 DIAGNOSIS — J329 Chronic sinusitis, unspecified: Secondary | ICD-10-CM | POA: Diagnosis not present

## 2021-06-26 DIAGNOSIS — H3530 Unspecified macular degeneration: Secondary | ICD-10-CM | POA: Diagnosis not present

## 2021-06-26 DIAGNOSIS — I5032 Chronic diastolic (congestive) heart failure: Secondary | ICD-10-CM | POA: Diagnosis not present

## 2021-06-26 DIAGNOSIS — Z7901 Long term (current) use of anticoagulants: Secondary | ICD-10-CM | POA: Diagnosis not present

## 2021-06-26 DIAGNOSIS — H547 Unspecified visual loss: Secondary | ICD-10-CM | POA: Diagnosis not present

## 2021-06-26 DIAGNOSIS — A419 Sepsis, unspecified organism: Secondary | ICD-10-CM | POA: Diagnosis not present

## 2021-06-26 DIAGNOSIS — D631 Anemia in chronic kidney disease: Secondary | ICD-10-CM | POA: Diagnosis not present

## 2021-06-27 ENCOUNTER — Telehealth: Payer: Self-pay | Admitting: Internal Medicine

## 2021-06-27 DIAGNOSIS — G9341 Metabolic encephalopathy: Secondary | ICD-10-CM | POA: Diagnosis not present

## 2021-06-27 DIAGNOSIS — N1831 Chronic kidney disease, stage 3a: Secondary | ICD-10-CM | POA: Diagnosis not present

## 2021-06-27 DIAGNOSIS — Z7901 Long term (current) use of anticoagulants: Secondary | ICD-10-CM | POA: Diagnosis not present

## 2021-06-27 DIAGNOSIS — I5032 Chronic diastolic (congestive) heart failure: Secondary | ICD-10-CM | POA: Diagnosis not present

## 2021-06-27 DIAGNOSIS — H547 Unspecified visual loss: Secondary | ICD-10-CM | POA: Diagnosis not present

## 2021-06-27 DIAGNOSIS — I130 Hypertensive heart and chronic kidney disease with heart failure and stage 1 through stage 4 chronic kidney disease, or unspecified chronic kidney disease: Secondary | ICD-10-CM | POA: Diagnosis not present

## 2021-06-27 DIAGNOSIS — W109XXA Fall (on) (from) unspecified stairs and steps, initial encounter: Secondary | ICD-10-CM | POA: Diagnosis not present

## 2021-06-27 DIAGNOSIS — A419 Sepsis, unspecified organism: Secondary | ICD-10-CM | POA: Diagnosis not present

## 2021-06-27 DIAGNOSIS — J9601 Acute respiratory failure with hypoxia: Secondary | ICD-10-CM | POA: Diagnosis not present

## 2021-06-27 DIAGNOSIS — I4821 Permanent atrial fibrillation: Secondary | ICD-10-CM | POA: Diagnosis not present

## 2021-06-27 DIAGNOSIS — J329 Chronic sinusitis, unspecified: Secondary | ICD-10-CM | POA: Diagnosis not present

## 2021-06-27 DIAGNOSIS — F0392 Unspecified dementia, unspecified severity, with psychotic disturbance: Secondary | ICD-10-CM | POA: Diagnosis not present

## 2021-06-27 DIAGNOSIS — H3530 Unspecified macular degeneration: Secondary | ICD-10-CM | POA: Diagnosis not present

## 2021-06-27 DIAGNOSIS — K5900 Constipation, unspecified: Secondary | ICD-10-CM | POA: Diagnosis not present

## 2021-06-27 DIAGNOSIS — R2243 Localized swelling, mass and lump, lower limb, bilateral: Secondary | ICD-10-CM | POA: Diagnosis not present

## 2021-06-27 DIAGNOSIS — R233 Spontaneous ecchymoses: Secondary | ICD-10-CM | POA: Diagnosis not present

## 2021-06-27 DIAGNOSIS — M6281 Muscle weakness (generalized): Secondary | ICD-10-CM | POA: Diagnosis not present

## 2021-06-27 DIAGNOSIS — Z9181 History of falling: Secondary | ICD-10-CM | POA: Diagnosis not present

## 2021-06-27 DIAGNOSIS — D631 Anemia in chronic kidney disease: Secondary | ICD-10-CM | POA: Diagnosis not present

## 2021-06-27 NOTE — Telephone Encounter (Signed)
Home Health verbal orders-caller/Agency: Melissa/ [facility] ? ?Callback number: [phone] secure vm ? ?Requesting OT/PT/Skilled nursing/Social Work/Speech: OT ? ?Frequency: 1w7 ?

## 2021-06-28 DIAGNOSIS — Z9181 History of falling: Secondary | ICD-10-CM | POA: Diagnosis not present

## 2021-06-28 DIAGNOSIS — I130 Hypertensive heart and chronic kidney disease with heart failure and stage 1 through stage 4 chronic kidney disease, or unspecified chronic kidney disease: Secondary | ICD-10-CM | POA: Diagnosis not present

## 2021-06-28 DIAGNOSIS — I5032 Chronic diastolic (congestive) heart failure: Secondary | ICD-10-CM | POA: Diagnosis not present

## 2021-06-28 DIAGNOSIS — G9341 Metabolic encephalopathy: Secondary | ICD-10-CM | POA: Diagnosis not present

## 2021-06-28 DIAGNOSIS — A419 Sepsis, unspecified organism: Secondary | ICD-10-CM | POA: Diagnosis not present

## 2021-06-28 DIAGNOSIS — I4821 Permanent atrial fibrillation: Secondary | ICD-10-CM | POA: Diagnosis not present

## 2021-06-28 DIAGNOSIS — Z7901 Long term (current) use of anticoagulants: Secondary | ICD-10-CM | POA: Diagnosis not present

## 2021-06-28 DIAGNOSIS — J9601 Acute respiratory failure with hypoxia: Secondary | ICD-10-CM | POA: Diagnosis not present

## 2021-06-28 DIAGNOSIS — H3530 Unspecified macular degeneration: Secondary | ICD-10-CM | POA: Diagnosis not present

## 2021-06-28 DIAGNOSIS — H547 Unspecified visual loss: Secondary | ICD-10-CM | POA: Diagnosis not present

## 2021-06-28 DIAGNOSIS — F0392 Unspecified dementia, unspecified severity, with psychotic disturbance: Secondary | ICD-10-CM | POA: Diagnosis not present

## 2021-06-28 DIAGNOSIS — J329 Chronic sinusitis, unspecified: Secondary | ICD-10-CM | POA: Diagnosis not present

## 2021-06-28 DIAGNOSIS — D631 Anemia in chronic kidney disease: Secondary | ICD-10-CM | POA: Diagnosis not present

## 2021-06-28 DIAGNOSIS — N1831 Chronic kidney disease, stage 3a: Secondary | ICD-10-CM | POA: Diagnosis not present

## 2021-06-28 DIAGNOSIS — K5900 Constipation, unspecified: Secondary | ICD-10-CM | POA: Diagnosis not present

## 2021-06-28 NOTE — Telephone Encounter (Signed)
Ok for verbals 

## 2021-06-28 NOTE — Telephone Encounter (Signed)
LDVM at [phone] with verbal orders. Office number was provided in case she has additional questions.  ?

## 2021-07-03 DIAGNOSIS — J329 Chronic sinusitis, unspecified: Secondary | ICD-10-CM | POA: Diagnosis not present

## 2021-07-03 DIAGNOSIS — G9341 Metabolic encephalopathy: Secondary | ICD-10-CM | POA: Diagnosis not present

## 2021-07-03 DIAGNOSIS — H547 Unspecified visual loss: Secondary | ICD-10-CM | POA: Diagnosis not present

## 2021-07-03 DIAGNOSIS — A419 Sepsis, unspecified organism: Secondary | ICD-10-CM | POA: Diagnosis not present

## 2021-07-03 DIAGNOSIS — F0392 Unspecified dementia, unspecified severity, with psychotic disturbance: Secondary | ICD-10-CM | POA: Diagnosis not present

## 2021-07-03 DIAGNOSIS — N1831 Chronic kidney disease, stage 3a: Secondary | ICD-10-CM | POA: Diagnosis not present

## 2021-07-03 DIAGNOSIS — H3530 Unspecified macular degeneration: Secondary | ICD-10-CM | POA: Diagnosis not present

## 2021-07-03 DIAGNOSIS — J9601 Acute respiratory failure with hypoxia: Secondary | ICD-10-CM | POA: Diagnosis not present

## 2021-07-03 DIAGNOSIS — K5900 Constipation, unspecified: Secondary | ICD-10-CM | POA: Diagnosis not present

## 2021-07-03 DIAGNOSIS — D631 Anemia in chronic kidney disease: Secondary | ICD-10-CM | POA: Diagnosis not present

## 2021-07-03 DIAGNOSIS — I130 Hypertensive heart and chronic kidney disease with heart failure and stage 1 through stage 4 chronic kidney disease, or unspecified chronic kidney disease: Secondary | ICD-10-CM | POA: Diagnosis not present

## 2021-07-03 DIAGNOSIS — I4821 Permanent atrial fibrillation: Secondary | ICD-10-CM | POA: Diagnosis not present

## 2021-07-03 DIAGNOSIS — Z7901 Long term (current) use of anticoagulants: Secondary | ICD-10-CM | POA: Diagnosis not present

## 2021-07-03 DIAGNOSIS — I5032 Chronic diastolic (congestive) heart failure: Secondary | ICD-10-CM | POA: Diagnosis not present

## 2021-07-03 DIAGNOSIS — Z9181 History of falling: Secondary | ICD-10-CM | POA: Diagnosis not present

## 2021-07-04 DIAGNOSIS — Z9181 History of falling: Secondary | ICD-10-CM | POA: Diagnosis not present

## 2021-07-04 DIAGNOSIS — I130 Hypertensive heart and chronic kidney disease with heart failure and stage 1 through stage 4 chronic kidney disease, or unspecified chronic kidney disease: Secondary | ICD-10-CM | POA: Diagnosis not present

## 2021-07-04 DIAGNOSIS — H3530 Unspecified macular degeneration: Secondary | ICD-10-CM | POA: Diagnosis not present

## 2021-07-04 DIAGNOSIS — I4821 Permanent atrial fibrillation: Secondary | ICD-10-CM | POA: Diagnosis not present

## 2021-07-04 DIAGNOSIS — D631 Anemia in chronic kidney disease: Secondary | ICD-10-CM | POA: Diagnosis not present

## 2021-07-04 DIAGNOSIS — H547 Unspecified visual loss: Secondary | ICD-10-CM | POA: Diagnosis not present

## 2021-07-04 DIAGNOSIS — K5900 Constipation, unspecified: Secondary | ICD-10-CM | POA: Diagnosis not present

## 2021-07-04 DIAGNOSIS — Z7901 Long term (current) use of anticoagulants: Secondary | ICD-10-CM | POA: Diagnosis not present

## 2021-07-04 DIAGNOSIS — J9601 Acute respiratory failure with hypoxia: Secondary | ICD-10-CM | POA: Diagnosis not present

## 2021-07-04 DIAGNOSIS — F0392 Unspecified dementia, unspecified severity, with psychotic disturbance: Secondary | ICD-10-CM | POA: Diagnosis not present

## 2021-07-04 DIAGNOSIS — A419 Sepsis, unspecified organism: Secondary | ICD-10-CM | POA: Diagnosis not present

## 2021-07-04 DIAGNOSIS — I5032 Chronic diastolic (congestive) heart failure: Secondary | ICD-10-CM | POA: Diagnosis not present

## 2021-07-04 DIAGNOSIS — G9340 Encephalopathy, unspecified: Secondary | ICD-10-CM | POA: Diagnosis not present

## 2021-07-04 DIAGNOSIS — N1831 Chronic kidney disease, stage 3a: Secondary | ICD-10-CM | POA: Diagnosis not present

## 2021-07-04 DIAGNOSIS — G9341 Metabolic encephalopathy: Secondary | ICD-10-CM | POA: Diagnosis not present

## 2021-07-04 DIAGNOSIS — M6281 Muscle weakness (generalized): Secondary | ICD-10-CM | POA: Diagnosis not present

## 2021-07-04 DIAGNOSIS — E876 Hypokalemia: Secondary | ICD-10-CM | POA: Diagnosis not present

## 2021-07-04 DIAGNOSIS — J329 Chronic sinusitis, unspecified: Secondary | ICD-10-CM | POA: Diagnosis not present

## 2021-07-04 DIAGNOSIS — N179 Acute kidney failure, unspecified: Secondary | ICD-10-CM | POA: Diagnosis not present

## 2021-07-05 DIAGNOSIS — E785 Hyperlipidemia, unspecified: Secondary | ICD-10-CM | POA: Diagnosis not present

## 2021-07-05 DIAGNOSIS — I480 Paroxysmal atrial fibrillation: Secondary | ICD-10-CM | POA: Diagnosis not present

## 2021-07-05 DIAGNOSIS — E119 Type 2 diabetes mellitus without complications: Secondary | ICD-10-CM | POA: Diagnosis not present

## 2021-07-05 DIAGNOSIS — E876 Hypokalemia: Secondary | ICD-10-CM | POA: Diagnosis not present

## 2021-07-05 DIAGNOSIS — E559 Vitamin D deficiency, unspecified: Secondary | ICD-10-CM | POA: Diagnosis not present

## 2021-07-05 DIAGNOSIS — N179 Acute kidney failure, unspecified: Secondary | ICD-10-CM | POA: Diagnosis not present

## 2021-07-05 DIAGNOSIS — M6281 Muscle weakness (generalized): Secondary | ICD-10-CM | POA: Diagnosis not present

## 2021-07-06 DIAGNOSIS — Z9181 History of falling: Secondary | ICD-10-CM | POA: Diagnosis not present

## 2021-07-06 DIAGNOSIS — F0392 Unspecified dementia, unspecified severity, with psychotic disturbance: Secondary | ICD-10-CM | POA: Diagnosis not present

## 2021-07-06 DIAGNOSIS — I4821 Permanent atrial fibrillation: Secondary | ICD-10-CM | POA: Diagnosis not present

## 2021-07-06 DIAGNOSIS — K5900 Constipation, unspecified: Secondary | ICD-10-CM | POA: Diagnosis not present

## 2021-07-06 DIAGNOSIS — Z7901 Long term (current) use of anticoagulants: Secondary | ICD-10-CM | POA: Diagnosis not present

## 2021-07-06 DIAGNOSIS — H547 Unspecified visual loss: Secondary | ICD-10-CM | POA: Diagnosis not present

## 2021-07-06 DIAGNOSIS — H3530 Unspecified macular degeneration: Secondary | ICD-10-CM | POA: Diagnosis not present

## 2021-07-06 DIAGNOSIS — J329 Chronic sinusitis, unspecified: Secondary | ICD-10-CM | POA: Diagnosis not present

## 2021-07-06 DIAGNOSIS — D631 Anemia in chronic kidney disease: Secondary | ICD-10-CM | POA: Diagnosis not present

## 2021-07-06 DIAGNOSIS — N1831 Chronic kidney disease, stage 3a: Secondary | ICD-10-CM | POA: Diagnosis not present

## 2021-07-06 DIAGNOSIS — I130 Hypertensive heart and chronic kidney disease with heart failure and stage 1 through stage 4 chronic kidney disease, or unspecified chronic kidney disease: Secondary | ICD-10-CM | POA: Diagnosis not present

## 2021-07-06 DIAGNOSIS — J9601 Acute respiratory failure with hypoxia: Secondary | ICD-10-CM | POA: Diagnosis not present

## 2021-07-06 DIAGNOSIS — A419 Sepsis, unspecified organism: Secondary | ICD-10-CM | POA: Diagnosis not present

## 2021-07-06 DIAGNOSIS — G9341 Metabolic encephalopathy: Secondary | ICD-10-CM | POA: Diagnosis not present

## 2021-07-06 DIAGNOSIS — I5032 Chronic diastolic (congestive) heart failure: Secondary | ICD-10-CM | POA: Diagnosis not present

## 2021-07-09 DIAGNOSIS — H3530 Unspecified macular degeneration: Secondary | ICD-10-CM | POA: Diagnosis not present

## 2021-07-09 DIAGNOSIS — J329 Chronic sinusitis, unspecified: Secondary | ICD-10-CM | POA: Diagnosis not present

## 2021-07-09 DIAGNOSIS — Z7901 Long term (current) use of anticoagulants: Secondary | ICD-10-CM | POA: Diagnosis not present

## 2021-07-09 DIAGNOSIS — Z9181 History of falling: Secondary | ICD-10-CM | POA: Diagnosis not present

## 2021-07-09 DIAGNOSIS — G9341 Metabolic encephalopathy: Secondary | ICD-10-CM | POA: Diagnosis not present

## 2021-07-09 DIAGNOSIS — H547 Unspecified visual loss: Secondary | ICD-10-CM | POA: Diagnosis not present

## 2021-07-09 DIAGNOSIS — K5900 Constipation, unspecified: Secondary | ICD-10-CM | POA: Diagnosis not present

## 2021-07-09 DIAGNOSIS — N1831 Chronic kidney disease, stage 3a: Secondary | ICD-10-CM | POA: Diagnosis not present

## 2021-07-09 DIAGNOSIS — I4821 Permanent atrial fibrillation: Secondary | ICD-10-CM | POA: Diagnosis not present

## 2021-07-09 DIAGNOSIS — A419 Sepsis, unspecified organism: Secondary | ICD-10-CM | POA: Diagnosis not present

## 2021-07-09 DIAGNOSIS — I5032 Chronic diastolic (congestive) heart failure: Secondary | ICD-10-CM | POA: Diagnosis not present

## 2021-07-09 DIAGNOSIS — J9601 Acute respiratory failure with hypoxia: Secondary | ICD-10-CM | POA: Diagnosis not present

## 2021-07-09 DIAGNOSIS — F0392 Unspecified dementia, unspecified severity, with psychotic disturbance: Secondary | ICD-10-CM | POA: Diagnosis not present

## 2021-07-09 DIAGNOSIS — D631 Anemia in chronic kidney disease: Secondary | ICD-10-CM | POA: Diagnosis not present

## 2021-07-09 DIAGNOSIS — I130 Hypertensive heart and chronic kidney disease with heart failure and stage 1 through stage 4 chronic kidney disease, or unspecified chronic kidney disease: Secondary | ICD-10-CM | POA: Diagnosis not present

## 2021-07-11 DIAGNOSIS — I130 Hypertensive heart and chronic kidney disease with heart failure and stage 1 through stage 4 chronic kidney disease, or unspecified chronic kidney disease: Secondary | ICD-10-CM | POA: Diagnosis not present

## 2021-07-11 DIAGNOSIS — N1831 Chronic kidney disease, stage 3a: Secondary | ICD-10-CM | POA: Diagnosis not present

## 2021-07-11 DIAGNOSIS — I5032 Chronic diastolic (congestive) heart failure: Secondary | ICD-10-CM | POA: Diagnosis not present

## 2021-07-11 DIAGNOSIS — D631 Anemia in chronic kidney disease: Secondary | ICD-10-CM | POA: Diagnosis not present

## 2021-07-11 DIAGNOSIS — J9601 Acute respiratory failure with hypoxia: Secondary | ICD-10-CM | POA: Diagnosis not present

## 2021-07-11 DIAGNOSIS — G9341 Metabolic encephalopathy: Secondary | ICD-10-CM | POA: Diagnosis not present

## 2021-07-11 DIAGNOSIS — F0392 Unspecified dementia, unspecified severity, with psychotic disturbance: Secondary | ICD-10-CM | POA: Diagnosis not present

## 2021-07-11 DIAGNOSIS — A419 Sepsis, unspecified organism: Secondary | ICD-10-CM | POA: Diagnosis not present

## 2021-07-11 DIAGNOSIS — J329 Chronic sinusitis, unspecified: Secondary | ICD-10-CM | POA: Diagnosis not present

## 2021-07-11 DIAGNOSIS — I4821 Permanent atrial fibrillation: Secondary | ICD-10-CM | POA: Diagnosis not present

## 2021-07-11 DIAGNOSIS — H3530 Unspecified macular degeneration: Secondary | ICD-10-CM | POA: Diagnosis not present

## 2021-07-11 DIAGNOSIS — Z7901 Long term (current) use of anticoagulants: Secondary | ICD-10-CM | POA: Diagnosis not present

## 2021-07-11 DIAGNOSIS — Z9181 History of falling: Secondary | ICD-10-CM | POA: Diagnosis not present

## 2021-07-11 DIAGNOSIS — H547 Unspecified visual loss: Secondary | ICD-10-CM | POA: Diagnosis not present

## 2021-07-11 DIAGNOSIS — K5900 Constipation, unspecified: Secondary | ICD-10-CM | POA: Diagnosis not present

## 2021-07-12 DIAGNOSIS — A419 Sepsis, unspecified organism: Secondary | ICD-10-CM | POA: Diagnosis not present

## 2021-07-12 DIAGNOSIS — Z9181 History of falling: Secondary | ICD-10-CM | POA: Diagnosis not present

## 2021-07-12 DIAGNOSIS — H3530 Unspecified macular degeneration: Secondary | ICD-10-CM | POA: Diagnosis not present

## 2021-07-12 DIAGNOSIS — G9341 Metabolic encephalopathy: Secondary | ICD-10-CM | POA: Diagnosis not present

## 2021-07-12 DIAGNOSIS — D631 Anemia in chronic kidney disease: Secondary | ICD-10-CM | POA: Diagnosis not present

## 2021-07-12 DIAGNOSIS — I5033 Acute on chronic diastolic (congestive) heart failure: Secondary | ICD-10-CM | POA: Diagnosis not present

## 2021-07-12 DIAGNOSIS — I5032 Chronic diastolic (congestive) heart failure: Secondary | ICD-10-CM | POA: Diagnosis not present

## 2021-07-12 DIAGNOSIS — K5900 Constipation, unspecified: Secondary | ICD-10-CM | POA: Diagnosis not present

## 2021-07-12 DIAGNOSIS — F0392 Unspecified dementia, unspecified severity, with psychotic disturbance: Secondary | ICD-10-CM | POA: Diagnosis not present

## 2021-07-12 DIAGNOSIS — J9601 Acute respiratory failure with hypoxia: Secondary | ICD-10-CM | POA: Diagnosis not present

## 2021-07-12 DIAGNOSIS — N1831 Chronic kidney disease, stage 3a: Secondary | ICD-10-CM | POA: Diagnosis not present

## 2021-07-12 DIAGNOSIS — E876 Hypokalemia: Secondary | ICD-10-CM | POA: Diagnosis not present

## 2021-07-12 DIAGNOSIS — Z7901 Long term (current) use of anticoagulants: Secondary | ICD-10-CM | POA: Diagnosis not present

## 2021-07-12 DIAGNOSIS — N179 Acute kidney failure, unspecified: Secondary | ICD-10-CM | POA: Diagnosis not present

## 2021-07-12 DIAGNOSIS — I4821 Permanent atrial fibrillation: Secondary | ICD-10-CM | POA: Diagnosis not present

## 2021-07-12 DIAGNOSIS — I130 Hypertensive heart and chronic kidney disease with heart failure and stage 1 through stage 4 chronic kidney disease, or unspecified chronic kidney disease: Secondary | ICD-10-CM | POA: Diagnosis not present

## 2021-07-12 DIAGNOSIS — J329 Chronic sinusitis, unspecified: Secondary | ICD-10-CM | POA: Diagnosis not present

## 2021-07-12 DIAGNOSIS — M6281 Muscle weakness (generalized): Secondary | ICD-10-CM | POA: Diagnosis not present

## 2021-07-12 DIAGNOSIS — H547 Unspecified visual loss: Secondary | ICD-10-CM | POA: Diagnosis not present

## 2021-07-16 DIAGNOSIS — J329 Chronic sinusitis, unspecified: Secondary | ICD-10-CM | POA: Diagnosis not present

## 2021-07-16 DIAGNOSIS — N1831 Chronic kidney disease, stage 3a: Secondary | ICD-10-CM | POA: Diagnosis not present

## 2021-07-16 DIAGNOSIS — I5032 Chronic diastolic (congestive) heart failure: Secondary | ICD-10-CM | POA: Diagnosis not present

## 2021-07-16 DIAGNOSIS — I130 Hypertensive heart and chronic kidney disease with heart failure and stage 1 through stage 4 chronic kidney disease, or unspecified chronic kidney disease: Secondary | ICD-10-CM | POA: Diagnosis not present

## 2021-07-16 DIAGNOSIS — K5900 Constipation, unspecified: Secondary | ICD-10-CM | POA: Diagnosis not present

## 2021-07-16 DIAGNOSIS — D631 Anemia in chronic kidney disease: Secondary | ICD-10-CM | POA: Diagnosis not present

## 2021-07-16 DIAGNOSIS — G9341 Metabolic encephalopathy: Secondary | ICD-10-CM | POA: Diagnosis not present

## 2021-07-16 DIAGNOSIS — A419 Sepsis, unspecified organism: Secondary | ICD-10-CM | POA: Diagnosis not present

## 2021-07-16 DIAGNOSIS — J9601 Acute respiratory failure with hypoxia: Secondary | ICD-10-CM | POA: Diagnosis not present

## 2021-07-16 DIAGNOSIS — H3530 Unspecified macular degeneration: Secondary | ICD-10-CM | POA: Diagnosis not present

## 2021-07-16 DIAGNOSIS — H547 Unspecified visual loss: Secondary | ICD-10-CM | POA: Diagnosis not present

## 2021-07-16 DIAGNOSIS — I4821 Permanent atrial fibrillation: Secondary | ICD-10-CM | POA: Diagnosis not present

## 2021-07-16 DIAGNOSIS — Z9181 History of falling: Secondary | ICD-10-CM | POA: Diagnosis not present

## 2021-07-16 DIAGNOSIS — F0392 Unspecified dementia, unspecified severity, with psychotic disturbance: Secondary | ICD-10-CM | POA: Diagnosis not present

## 2021-07-16 DIAGNOSIS — Z7901 Long term (current) use of anticoagulants: Secondary | ICD-10-CM | POA: Diagnosis not present

## 2021-07-17 DIAGNOSIS — H547 Unspecified visual loss: Secondary | ICD-10-CM | POA: Diagnosis not present

## 2021-07-17 DIAGNOSIS — I130 Hypertensive heart and chronic kidney disease with heart failure and stage 1 through stage 4 chronic kidney disease, or unspecified chronic kidney disease: Secondary | ICD-10-CM | POA: Diagnosis not present

## 2021-07-17 DIAGNOSIS — A419 Sepsis, unspecified organism: Secondary | ICD-10-CM | POA: Diagnosis not present

## 2021-07-17 DIAGNOSIS — G9341 Metabolic encephalopathy: Secondary | ICD-10-CM | POA: Diagnosis not present

## 2021-07-17 DIAGNOSIS — Z7901 Long term (current) use of anticoagulants: Secondary | ICD-10-CM | POA: Diagnosis not present

## 2021-07-17 DIAGNOSIS — F0392 Unspecified dementia, unspecified severity, with psychotic disturbance: Secondary | ICD-10-CM | POA: Diagnosis not present

## 2021-07-17 DIAGNOSIS — I4821 Permanent atrial fibrillation: Secondary | ICD-10-CM | POA: Diagnosis not present

## 2021-07-17 DIAGNOSIS — Z9181 History of falling: Secondary | ICD-10-CM | POA: Diagnosis not present

## 2021-07-17 DIAGNOSIS — K5900 Constipation, unspecified: Secondary | ICD-10-CM | POA: Diagnosis not present

## 2021-07-17 DIAGNOSIS — I5032 Chronic diastolic (congestive) heart failure: Secondary | ICD-10-CM | POA: Diagnosis not present

## 2021-07-17 DIAGNOSIS — J329 Chronic sinusitis, unspecified: Secondary | ICD-10-CM | POA: Diagnosis not present

## 2021-07-17 DIAGNOSIS — D631 Anemia in chronic kidney disease: Secondary | ICD-10-CM | POA: Diagnosis not present

## 2021-07-17 DIAGNOSIS — H3530 Unspecified macular degeneration: Secondary | ICD-10-CM | POA: Diagnosis not present

## 2021-07-17 DIAGNOSIS — N1831 Chronic kidney disease, stage 3a: Secondary | ICD-10-CM | POA: Diagnosis not present

## 2021-07-17 DIAGNOSIS — J9601 Acute respiratory failure with hypoxia: Secondary | ICD-10-CM | POA: Diagnosis not present

## 2021-07-19 DIAGNOSIS — A419 Sepsis, unspecified organism: Secondary | ICD-10-CM | POA: Diagnosis not present

## 2021-07-19 DIAGNOSIS — Z9181 History of falling: Secondary | ICD-10-CM | POA: Diagnosis not present

## 2021-07-19 DIAGNOSIS — H3530 Unspecified macular degeneration: Secondary | ICD-10-CM | POA: Diagnosis not present

## 2021-07-19 DIAGNOSIS — J9601 Acute respiratory failure with hypoxia: Secondary | ICD-10-CM | POA: Diagnosis not present

## 2021-07-19 DIAGNOSIS — I5032 Chronic diastolic (congestive) heart failure: Secondary | ICD-10-CM | POA: Diagnosis not present

## 2021-07-19 DIAGNOSIS — Z7901 Long term (current) use of anticoagulants: Secondary | ICD-10-CM | POA: Diagnosis not present

## 2021-07-19 DIAGNOSIS — G9341 Metabolic encephalopathy: Secondary | ICD-10-CM | POA: Diagnosis not present

## 2021-07-19 DIAGNOSIS — J329 Chronic sinusitis, unspecified: Secondary | ICD-10-CM | POA: Diagnosis not present

## 2021-07-19 DIAGNOSIS — K5900 Constipation, unspecified: Secondary | ICD-10-CM | POA: Diagnosis not present

## 2021-07-19 DIAGNOSIS — F0392 Unspecified dementia, unspecified severity, with psychotic disturbance: Secondary | ICD-10-CM | POA: Diagnosis not present

## 2021-07-19 DIAGNOSIS — I130 Hypertensive heart and chronic kidney disease with heart failure and stage 1 through stage 4 chronic kidney disease, or unspecified chronic kidney disease: Secondary | ICD-10-CM | POA: Diagnosis not present

## 2021-07-19 DIAGNOSIS — I4821 Permanent atrial fibrillation: Secondary | ICD-10-CM | POA: Diagnosis not present

## 2021-07-19 DIAGNOSIS — N1831 Chronic kidney disease, stage 3a: Secondary | ICD-10-CM | POA: Diagnosis not present

## 2021-07-19 DIAGNOSIS — D631 Anemia in chronic kidney disease: Secondary | ICD-10-CM | POA: Diagnosis not present

## 2021-07-19 DIAGNOSIS — H547 Unspecified visual loss: Secondary | ICD-10-CM | POA: Diagnosis not present

## 2021-07-20 DIAGNOSIS — D631 Anemia in chronic kidney disease: Secondary | ICD-10-CM | POA: Diagnosis not present

## 2021-07-20 DIAGNOSIS — H547 Unspecified visual loss: Secondary | ICD-10-CM | POA: Diagnosis not present

## 2021-07-20 DIAGNOSIS — N1831 Chronic kidney disease, stage 3a: Secondary | ICD-10-CM | POA: Diagnosis not present

## 2021-07-20 DIAGNOSIS — H3530 Unspecified macular degeneration: Secondary | ICD-10-CM | POA: Diagnosis not present

## 2021-07-20 DIAGNOSIS — I130 Hypertensive heart and chronic kidney disease with heart failure and stage 1 through stage 4 chronic kidney disease, or unspecified chronic kidney disease: Secondary | ICD-10-CM | POA: Diagnosis not present

## 2021-07-20 DIAGNOSIS — A419 Sepsis, unspecified organism: Secondary | ICD-10-CM | POA: Diagnosis not present

## 2021-07-20 DIAGNOSIS — J329 Chronic sinusitis, unspecified: Secondary | ICD-10-CM | POA: Diagnosis not present

## 2021-07-20 DIAGNOSIS — F0392 Unspecified dementia, unspecified severity, with psychotic disturbance: Secondary | ICD-10-CM | POA: Diagnosis not present

## 2021-07-20 DIAGNOSIS — Z7901 Long term (current) use of anticoagulants: Secondary | ICD-10-CM | POA: Diagnosis not present

## 2021-07-20 DIAGNOSIS — G9341 Metabolic encephalopathy: Secondary | ICD-10-CM | POA: Diagnosis not present

## 2021-07-20 DIAGNOSIS — J9601 Acute respiratory failure with hypoxia: Secondary | ICD-10-CM | POA: Diagnosis not present

## 2021-07-20 DIAGNOSIS — Z9181 History of falling: Secondary | ICD-10-CM | POA: Diagnosis not present

## 2021-07-20 DIAGNOSIS — I4821 Permanent atrial fibrillation: Secondary | ICD-10-CM | POA: Diagnosis not present

## 2021-07-20 DIAGNOSIS — K5900 Constipation, unspecified: Secondary | ICD-10-CM | POA: Diagnosis not present

## 2021-07-20 DIAGNOSIS — I5032 Chronic diastolic (congestive) heart failure: Secondary | ICD-10-CM | POA: Diagnosis not present

## 2021-07-24 ENCOUNTER — Telehealth: Payer: Self-pay | Admitting: Internal Medicine

## 2021-07-24 DIAGNOSIS — D631 Anemia in chronic kidney disease: Secondary | ICD-10-CM | POA: Diagnosis not present

## 2021-07-24 DIAGNOSIS — H3530 Unspecified macular degeneration: Secondary | ICD-10-CM | POA: Diagnosis not present

## 2021-07-24 DIAGNOSIS — J329 Chronic sinusitis, unspecified: Secondary | ICD-10-CM | POA: Diagnosis not present

## 2021-07-24 DIAGNOSIS — J9601 Acute respiratory failure with hypoxia: Secondary | ICD-10-CM | POA: Diagnosis not present

## 2021-07-24 DIAGNOSIS — Z7901 Long term (current) use of anticoagulants: Secondary | ICD-10-CM | POA: Diagnosis not present

## 2021-07-24 DIAGNOSIS — G9341 Metabolic encephalopathy: Secondary | ICD-10-CM | POA: Diagnosis not present

## 2021-07-24 DIAGNOSIS — I4821 Permanent atrial fibrillation: Secondary | ICD-10-CM | POA: Diagnosis not present

## 2021-07-24 DIAGNOSIS — F0392 Unspecified dementia, unspecified severity, with psychotic disturbance: Secondary | ICD-10-CM | POA: Diagnosis not present

## 2021-07-24 DIAGNOSIS — Z9181 History of falling: Secondary | ICD-10-CM | POA: Diagnosis not present

## 2021-07-24 DIAGNOSIS — H547 Unspecified visual loss: Secondary | ICD-10-CM | POA: Diagnosis not present

## 2021-07-24 DIAGNOSIS — K5900 Constipation, unspecified: Secondary | ICD-10-CM | POA: Diagnosis not present

## 2021-07-24 DIAGNOSIS — I130 Hypertensive heart and chronic kidney disease with heart failure and stage 1 through stage 4 chronic kidney disease, or unspecified chronic kidney disease: Secondary | ICD-10-CM | POA: Diagnosis not present

## 2021-07-24 DIAGNOSIS — A419 Sepsis, unspecified organism: Secondary | ICD-10-CM | POA: Diagnosis not present

## 2021-07-24 DIAGNOSIS — I5032 Chronic diastolic (congestive) heart failure: Secondary | ICD-10-CM | POA: Diagnosis not present

## 2021-07-24 DIAGNOSIS — N1831 Chronic kidney disease, stage 3a: Secondary | ICD-10-CM | POA: Diagnosis not present

## 2021-07-24 NOTE — Telephone Encounter (Signed)
Fine for verbals °

## 2021-07-24 NOTE — Telephone Encounter (Signed)
Home Health verbal orders-caller/Agency: Melissa/ [facility] ?  ?Callback number: [phone] secure vm per caller ?  ?Requesting OT/PT/Skilled nursing/Social Work/Speech: OT ? ?Reason: skipping next week ?  ?Frequency: 1w1 ?

## 2021-07-25 ENCOUNTER — Telehealth: Payer: Self-pay | Admitting: Internal Medicine

## 2021-07-25 DIAGNOSIS — E876 Hypokalemia: Secondary | ICD-10-CM | POA: Diagnosis not present

## 2021-07-25 DIAGNOSIS — I480 Paroxysmal atrial fibrillation: Secondary | ICD-10-CM | POA: Diagnosis not present

## 2021-07-25 NOTE — Telephone Encounter (Signed)
Called Melissa. LDVM at [phone] with verbal orders. Office number was provided.  ?

## 2021-07-25 NOTE — Telephone Encounter (Signed)
Verbal orders given to [facility].  ?

## 2021-07-25 NOTE — Telephone Encounter (Signed)
Home Health verbal orders-caller/Agency: [facility] ? ?Callback number: [phone] secure vm per caller ? ?Requesting OT/PT/Skilled nursing/Social Work/Speech: PT ? ? ?Frequency: 1w3 ?

## 2021-07-25 NOTE — Telephone Encounter (Signed)
Okay for verbals °

## 2021-07-31 DIAGNOSIS — N1831 Chronic kidney disease, stage 3a: Secondary | ICD-10-CM | POA: Diagnosis not present

## 2021-07-31 DIAGNOSIS — K5900 Constipation, unspecified: Secondary | ICD-10-CM | POA: Diagnosis not present

## 2021-07-31 DIAGNOSIS — G9341 Metabolic encephalopathy: Secondary | ICD-10-CM | POA: Diagnosis not present

## 2021-07-31 DIAGNOSIS — D631 Anemia in chronic kidney disease: Secondary | ICD-10-CM | POA: Diagnosis not present

## 2021-07-31 DIAGNOSIS — J329 Chronic sinusitis, unspecified: Secondary | ICD-10-CM | POA: Diagnosis not present

## 2021-07-31 DIAGNOSIS — I130 Hypertensive heart and chronic kidney disease with heart failure and stage 1 through stage 4 chronic kidney disease, or unspecified chronic kidney disease: Secondary | ICD-10-CM | POA: Diagnosis not present

## 2021-07-31 DIAGNOSIS — J9601 Acute respiratory failure with hypoxia: Secondary | ICD-10-CM | POA: Diagnosis not present

## 2021-07-31 DIAGNOSIS — A419 Sepsis, unspecified organism: Secondary | ICD-10-CM | POA: Diagnosis not present

## 2021-07-31 DIAGNOSIS — H3530 Unspecified macular degeneration: Secondary | ICD-10-CM | POA: Diagnosis not present

## 2021-07-31 DIAGNOSIS — H547 Unspecified visual loss: Secondary | ICD-10-CM | POA: Diagnosis not present

## 2021-07-31 DIAGNOSIS — I5032 Chronic diastolic (congestive) heart failure: Secondary | ICD-10-CM | POA: Diagnosis not present

## 2021-07-31 DIAGNOSIS — Z9181 History of falling: Secondary | ICD-10-CM | POA: Diagnosis not present

## 2021-07-31 DIAGNOSIS — F0392 Unspecified dementia, unspecified severity, with psychotic disturbance: Secondary | ICD-10-CM | POA: Diagnosis not present

## 2021-07-31 DIAGNOSIS — Z7901 Long term (current) use of anticoagulants: Secondary | ICD-10-CM | POA: Diagnosis not present

## 2021-07-31 DIAGNOSIS — I4821 Permanent atrial fibrillation: Secondary | ICD-10-CM | POA: Diagnosis not present

## 2021-08-01 DIAGNOSIS — E876 Hypokalemia: Secondary | ICD-10-CM | POA: Diagnosis not present

## 2021-08-01 DIAGNOSIS — I480 Paroxysmal atrial fibrillation: Secondary | ICD-10-CM | POA: Diagnosis not present

## 2021-08-01 DIAGNOSIS — N179 Acute kidney failure, unspecified: Secondary | ICD-10-CM | POA: Diagnosis not present

## 2021-08-03 DIAGNOSIS — H3530 Unspecified macular degeneration: Secondary | ICD-10-CM | POA: Diagnosis not present

## 2021-08-03 DIAGNOSIS — Z9181 History of falling: Secondary | ICD-10-CM | POA: Diagnosis not present

## 2021-08-03 DIAGNOSIS — N1831 Chronic kidney disease, stage 3a: Secondary | ICD-10-CM | POA: Diagnosis not present

## 2021-08-03 DIAGNOSIS — I5032 Chronic diastolic (congestive) heart failure: Secondary | ICD-10-CM | POA: Diagnosis not present

## 2021-08-03 DIAGNOSIS — A419 Sepsis, unspecified organism: Secondary | ICD-10-CM | POA: Diagnosis not present

## 2021-08-03 DIAGNOSIS — K5900 Constipation, unspecified: Secondary | ICD-10-CM | POA: Diagnosis not present

## 2021-08-03 DIAGNOSIS — F0392 Unspecified dementia, unspecified severity, with psychotic disturbance: Secondary | ICD-10-CM | POA: Diagnosis not present

## 2021-08-03 DIAGNOSIS — I4821 Permanent atrial fibrillation: Secondary | ICD-10-CM | POA: Diagnosis not present

## 2021-08-03 DIAGNOSIS — J329 Chronic sinusitis, unspecified: Secondary | ICD-10-CM | POA: Diagnosis not present

## 2021-08-03 DIAGNOSIS — Z7901 Long term (current) use of anticoagulants: Secondary | ICD-10-CM | POA: Diagnosis not present

## 2021-08-03 DIAGNOSIS — G9341 Metabolic encephalopathy: Secondary | ICD-10-CM | POA: Diagnosis not present

## 2021-08-03 DIAGNOSIS — I130 Hypertensive heart and chronic kidney disease with heart failure and stage 1 through stage 4 chronic kidney disease, or unspecified chronic kidney disease: Secondary | ICD-10-CM | POA: Diagnosis not present

## 2021-08-03 DIAGNOSIS — D631 Anemia in chronic kidney disease: Secondary | ICD-10-CM | POA: Diagnosis not present

## 2021-08-03 DIAGNOSIS — J9601 Acute respiratory failure with hypoxia: Secondary | ICD-10-CM | POA: Diagnosis not present

## 2021-08-03 DIAGNOSIS — H547 Unspecified visual loss: Secondary | ICD-10-CM | POA: Diagnosis not present

## 2021-08-06 DIAGNOSIS — I5032 Chronic diastolic (congestive) heart failure: Secondary | ICD-10-CM | POA: Diagnosis not present

## 2021-08-06 DIAGNOSIS — F0392 Unspecified dementia, unspecified severity, with psychotic disturbance: Secondary | ICD-10-CM | POA: Diagnosis not present

## 2021-08-06 DIAGNOSIS — Z9181 History of falling: Secondary | ICD-10-CM | POA: Diagnosis not present

## 2021-08-06 DIAGNOSIS — K5900 Constipation, unspecified: Secondary | ICD-10-CM | POA: Diagnosis not present

## 2021-08-06 DIAGNOSIS — J329 Chronic sinusitis, unspecified: Secondary | ICD-10-CM | POA: Diagnosis not present

## 2021-08-06 DIAGNOSIS — J9601 Acute respiratory failure with hypoxia: Secondary | ICD-10-CM | POA: Diagnosis not present

## 2021-08-06 DIAGNOSIS — A419 Sepsis, unspecified organism: Secondary | ICD-10-CM | POA: Diagnosis not present

## 2021-08-06 DIAGNOSIS — I4821 Permanent atrial fibrillation: Secondary | ICD-10-CM | POA: Diagnosis not present

## 2021-08-06 DIAGNOSIS — H547 Unspecified visual loss: Secondary | ICD-10-CM | POA: Diagnosis not present

## 2021-08-06 DIAGNOSIS — Z7901 Long term (current) use of anticoagulants: Secondary | ICD-10-CM | POA: Diagnosis not present

## 2021-08-06 DIAGNOSIS — I130 Hypertensive heart and chronic kidney disease with heart failure and stage 1 through stage 4 chronic kidney disease, or unspecified chronic kidney disease: Secondary | ICD-10-CM | POA: Diagnosis not present

## 2021-08-06 DIAGNOSIS — H3530 Unspecified macular degeneration: Secondary | ICD-10-CM | POA: Diagnosis not present

## 2021-08-06 DIAGNOSIS — D631 Anemia in chronic kidney disease: Secondary | ICD-10-CM | POA: Diagnosis not present

## 2021-08-06 DIAGNOSIS — G9341 Metabolic encephalopathy: Secondary | ICD-10-CM | POA: Diagnosis not present

## 2021-08-06 DIAGNOSIS — N1831 Chronic kidney disease, stage 3a: Secondary | ICD-10-CM | POA: Diagnosis not present

## 2021-08-08 DIAGNOSIS — G9341 Metabolic encephalopathy: Secondary | ICD-10-CM | POA: Diagnosis not present

## 2021-08-08 DIAGNOSIS — H3530 Unspecified macular degeneration: Secondary | ICD-10-CM | POA: Diagnosis not present

## 2021-08-08 DIAGNOSIS — I4821 Permanent atrial fibrillation: Secondary | ICD-10-CM | POA: Diagnosis not present

## 2021-08-08 DIAGNOSIS — H547 Unspecified visual loss: Secondary | ICD-10-CM | POA: Diagnosis not present

## 2021-08-08 DIAGNOSIS — N1831 Chronic kidney disease, stage 3a: Secondary | ICD-10-CM | POA: Diagnosis not present

## 2021-08-08 DIAGNOSIS — A419 Sepsis, unspecified organism: Secondary | ICD-10-CM | POA: Diagnosis not present

## 2021-08-08 DIAGNOSIS — K5900 Constipation, unspecified: Secondary | ICD-10-CM | POA: Diagnosis not present

## 2021-08-08 DIAGNOSIS — J9601 Acute respiratory failure with hypoxia: Secondary | ICD-10-CM | POA: Diagnosis not present

## 2021-08-08 DIAGNOSIS — J329 Chronic sinusitis, unspecified: Secondary | ICD-10-CM | POA: Diagnosis not present

## 2021-08-08 DIAGNOSIS — Z9181 History of falling: Secondary | ICD-10-CM | POA: Diagnosis not present

## 2021-08-08 DIAGNOSIS — I130 Hypertensive heart and chronic kidney disease with heart failure and stage 1 through stage 4 chronic kidney disease, or unspecified chronic kidney disease: Secondary | ICD-10-CM | POA: Diagnosis not present

## 2021-08-08 DIAGNOSIS — F0392 Unspecified dementia, unspecified severity, with psychotic disturbance: Secondary | ICD-10-CM | POA: Diagnosis not present

## 2021-08-08 DIAGNOSIS — Z7901 Long term (current) use of anticoagulants: Secondary | ICD-10-CM | POA: Diagnosis not present

## 2021-08-08 DIAGNOSIS — D631 Anemia in chronic kidney disease: Secondary | ICD-10-CM | POA: Diagnosis not present

## 2021-08-08 DIAGNOSIS — I5032 Chronic diastolic (congestive) heart failure: Secondary | ICD-10-CM | POA: Diagnosis not present

## 2021-08-14 DIAGNOSIS — H3530 Unspecified macular degeneration: Secondary | ICD-10-CM | POA: Diagnosis not present

## 2021-08-14 DIAGNOSIS — I5032 Chronic diastolic (congestive) heart failure: Secondary | ICD-10-CM | POA: Diagnosis not present

## 2021-08-14 DIAGNOSIS — N1831 Chronic kidney disease, stage 3a: Secondary | ICD-10-CM | POA: Diagnosis not present

## 2021-08-14 DIAGNOSIS — D631 Anemia in chronic kidney disease: Secondary | ICD-10-CM | POA: Diagnosis not present

## 2021-08-14 DIAGNOSIS — G9341 Metabolic encephalopathy: Secondary | ICD-10-CM | POA: Diagnosis not present

## 2021-08-14 DIAGNOSIS — H547 Unspecified visual loss: Secondary | ICD-10-CM | POA: Diagnosis not present

## 2021-08-14 DIAGNOSIS — Z7901 Long term (current) use of anticoagulants: Secondary | ICD-10-CM | POA: Diagnosis not present

## 2021-08-14 DIAGNOSIS — J9601 Acute respiratory failure with hypoxia: Secondary | ICD-10-CM | POA: Diagnosis not present

## 2021-08-14 DIAGNOSIS — A419 Sepsis, unspecified organism: Secondary | ICD-10-CM | POA: Diagnosis not present

## 2021-08-14 DIAGNOSIS — I4821 Permanent atrial fibrillation: Secondary | ICD-10-CM | POA: Diagnosis not present

## 2021-08-14 DIAGNOSIS — I130 Hypertensive heart and chronic kidney disease with heart failure and stage 1 through stage 4 chronic kidney disease, or unspecified chronic kidney disease: Secondary | ICD-10-CM | POA: Diagnosis not present

## 2021-08-14 DIAGNOSIS — Z9181 History of falling: Secondary | ICD-10-CM | POA: Diagnosis not present

## 2021-08-14 DIAGNOSIS — F0392 Unspecified dementia, unspecified severity, with psychotic disturbance: Secondary | ICD-10-CM | POA: Diagnosis not present

## 2021-08-14 DIAGNOSIS — J329 Chronic sinusitis, unspecified: Secondary | ICD-10-CM | POA: Diagnosis not present

## 2021-08-14 DIAGNOSIS — K5900 Constipation, unspecified: Secondary | ICD-10-CM | POA: Diagnosis not present

## 2021-08-15 DIAGNOSIS — Z9181 History of falling: Secondary | ICD-10-CM | POA: Diagnosis not present

## 2021-08-15 DIAGNOSIS — Z7901 Long term (current) use of anticoagulants: Secondary | ICD-10-CM | POA: Diagnosis not present

## 2021-08-15 DIAGNOSIS — I130 Hypertensive heart and chronic kidney disease with heart failure and stage 1 through stage 4 chronic kidney disease, or unspecified chronic kidney disease: Secondary | ICD-10-CM | POA: Diagnosis not present

## 2021-08-15 DIAGNOSIS — I4821 Permanent atrial fibrillation: Secondary | ICD-10-CM | POA: Diagnosis not present

## 2021-08-15 DIAGNOSIS — H547 Unspecified visual loss: Secondary | ICD-10-CM | POA: Diagnosis not present

## 2021-08-15 DIAGNOSIS — J329 Chronic sinusitis, unspecified: Secondary | ICD-10-CM | POA: Diagnosis not present

## 2021-08-15 DIAGNOSIS — H3530 Unspecified macular degeneration: Secondary | ICD-10-CM | POA: Diagnosis not present

## 2021-08-15 DIAGNOSIS — N1831 Chronic kidney disease, stage 3a: Secondary | ICD-10-CM | POA: Diagnosis not present

## 2021-08-15 DIAGNOSIS — I5032 Chronic diastolic (congestive) heart failure: Secondary | ICD-10-CM | POA: Diagnosis not present

## 2021-08-15 DIAGNOSIS — K5900 Constipation, unspecified: Secondary | ICD-10-CM | POA: Diagnosis not present

## 2021-08-15 DIAGNOSIS — F0392 Unspecified dementia, unspecified severity, with psychotic disturbance: Secondary | ICD-10-CM | POA: Diagnosis not present

## 2021-08-15 DIAGNOSIS — D631 Anemia in chronic kidney disease: Secondary | ICD-10-CM | POA: Diagnosis not present

## 2021-08-15 DIAGNOSIS — G9341 Metabolic encephalopathy: Secondary | ICD-10-CM | POA: Diagnosis not present

## 2021-08-15 DIAGNOSIS — J9601 Acute respiratory failure with hypoxia: Secondary | ICD-10-CM | POA: Diagnosis not present

## 2021-08-15 DIAGNOSIS — A419 Sepsis, unspecified organism: Secondary | ICD-10-CM | POA: Diagnosis not present

## 2021-08-16 ENCOUNTER — Telehealth: Payer: Self-pay | Admitting: Internal Medicine

## 2021-08-16 NOTE — Telephone Encounter (Signed)
I cannot sign any orders without face to face (or video virtual) due to medicare rules. We could do hospice referral if needed though. ?

## 2021-08-16 NOTE — Telephone Encounter (Signed)
Melissa Elrod with [facility] calls today in regards to getting occupation orders in for patient. ? ?1 time a week for 1 week ?Order can be given verbally! ? ?This is for discharging the patient as family is wanting to get hospice set up. ? ?CB: [phone] ?

## 2021-08-20 NOTE — Telephone Encounter (Signed)
LVM with with Dr. Frutoso Chase recommendations. Office number was provided  ?

## 2021-08-21 DIAGNOSIS — I4821 Permanent atrial fibrillation: Secondary | ICD-10-CM | POA: Diagnosis not present

## 2021-08-21 DIAGNOSIS — I130 Hypertensive heart and chronic kidney disease with heart failure and stage 1 through stage 4 chronic kidney disease, or unspecified chronic kidney disease: Secondary | ICD-10-CM | POA: Diagnosis not present

## 2021-08-21 DIAGNOSIS — J9601 Acute respiratory failure with hypoxia: Secondary | ICD-10-CM | POA: Diagnosis not present

## 2021-08-21 DIAGNOSIS — G9341 Metabolic encephalopathy: Secondary | ICD-10-CM | POA: Diagnosis not present

## 2021-08-21 DIAGNOSIS — J329 Chronic sinusitis, unspecified: Secondary | ICD-10-CM | POA: Diagnosis not present

## 2021-08-21 DIAGNOSIS — F0392 Unspecified dementia, unspecified severity, with psychotic disturbance: Secondary | ICD-10-CM | POA: Diagnosis not present

## 2021-08-21 DIAGNOSIS — A419 Sepsis, unspecified organism: Secondary | ICD-10-CM | POA: Diagnosis not present

## 2021-08-21 DIAGNOSIS — N1831 Chronic kidney disease, stage 3a: Secondary | ICD-10-CM | POA: Diagnosis not present

## 2021-08-21 DIAGNOSIS — Z7901 Long term (current) use of anticoagulants: Secondary | ICD-10-CM | POA: Diagnosis not present

## 2021-08-21 DIAGNOSIS — H3530 Unspecified macular degeneration: Secondary | ICD-10-CM | POA: Diagnosis not present

## 2021-08-21 DIAGNOSIS — K5900 Constipation, unspecified: Secondary | ICD-10-CM | POA: Diagnosis not present

## 2021-08-21 DIAGNOSIS — H547 Unspecified visual loss: Secondary | ICD-10-CM | POA: Diagnosis not present

## 2021-08-21 DIAGNOSIS — I5032 Chronic diastolic (congestive) heart failure: Secondary | ICD-10-CM | POA: Diagnosis not present

## 2021-08-21 DIAGNOSIS — Z9181 History of falling: Secondary | ICD-10-CM | POA: Diagnosis not present

## 2021-08-21 DIAGNOSIS — D631 Anemia in chronic kidney disease: Secondary | ICD-10-CM | POA: Diagnosis not present

## 2021-08-22 DIAGNOSIS — M6281 Muscle weakness (generalized): Secondary | ICD-10-CM | POA: Diagnosis not present

## 2021-08-22 DIAGNOSIS — N179 Acute kidney failure, unspecified: Secondary | ICD-10-CM | POA: Diagnosis not present

## 2021-08-22 DIAGNOSIS — I5033 Acute on chronic diastolic (congestive) heart failure: Secondary | ICD-10-CM | POA: Diagnosis not present

## 2021-08-22 DIAGNOSIS — G9340 Encephalopathy, unspecified: Secondary | ICD-10-CM | POA: Diagnosis not present

## 2021-08-29 DIAGNOSIS — U071 COVID-19: Secondary | ICD-10-CM | POA: Diagnosis not present

## 2021-09-02 IMAGING — MR MR 3D RECON AT SCANNER
19 of 22 series · 43 of 48 positions shown · IV contrast (gadavist)
Comparison: Right upper quadrant ultrasound dated 10/20/2020

CLINICAL DATA: Dilated common duct, evaluate for
choledocholithiasis

EXAM:
MRI ABDOMEN WITHOUT AND WITH CONTRAST (INCLUDING MRCP)
TECHNIQUE: Multiplanar multisequence MR imaging of the abdomen was performed
both before and after the administration of intravenous contrast.
Heavily T2-weighted images of the biliary and pancreatic ducts were
obtained, and three-dimensional MRCP images were rendered by post
processing.
CONTRAST:  8mL GADAVIST GADOBUTROL 1 MMOL/ML IV SOLN

[Series 4: T2 fat-sat · axial · 6.0mm · 1.25mm/px · 1 of 36 slices shown]
[im 1/36]
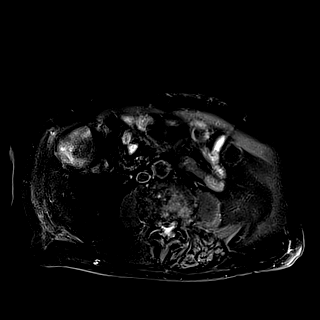

[Series 5: T2 · coronal · 6.0mm · 1.48mm/px · 1 of 35 slices shown (1 of 2)]
[im 1/35]
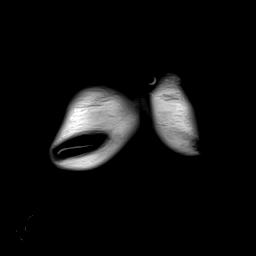

[Series 7: DWI · axial · 6.0mm · 1.49mm/px · z∈[-100,+147]mm · 2 of 72 slices shown (1 of 2)]
[im 1/72]
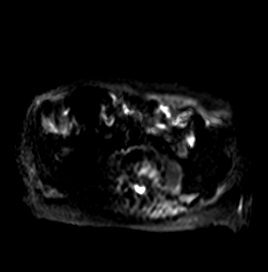
[im 72/72]
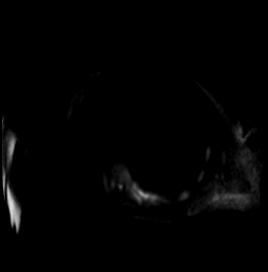

[Series 8: DWI · axial · 6.0mm · 1.49mm/px · 1 of 36 slices shown (2 of 2)]
[im 1/36]
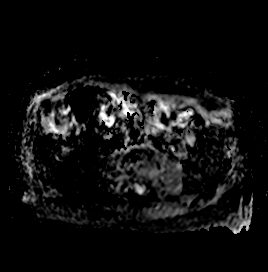

[Series 9: T1 · axial · 3.0mm · 1.25mm/px · z∈[-108,+148]mm · 3 of 88 slices shown (1 of 2)]
[im 1/88]
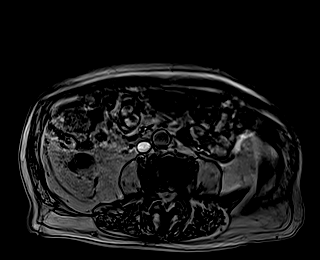
[im 44/88]
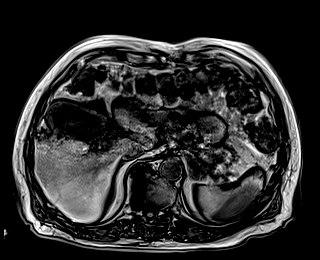
[im 88/88]
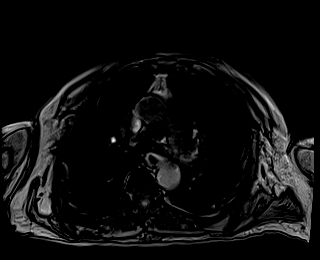

[Series 10: T1 · axial · 3.0mm · 1.25mm/px · z∈[-108,+148]mm · 3 of 88 slices shown (2 of 2)]
[im 1/88]
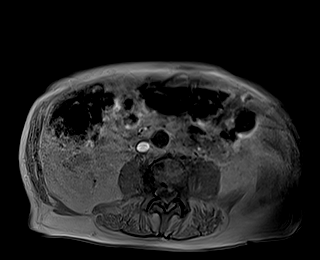
[im 44/88]
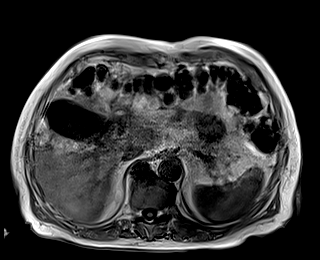
[im 88/88]
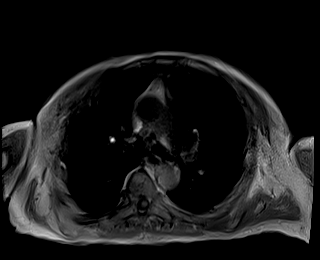

[Series 11: cor obl thk · sagittal · 50.0mm · 0.78mm/px · 1 of 9 slices shown]
[im 1/9]
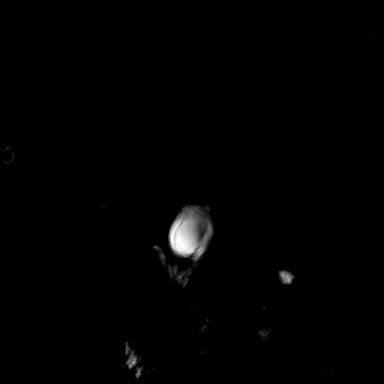

[Series 12: cor_3d_spc_trig · coronal · 1.0mm · 0.49mm/px · 2 of 72 slices shown]
[im 1/72]
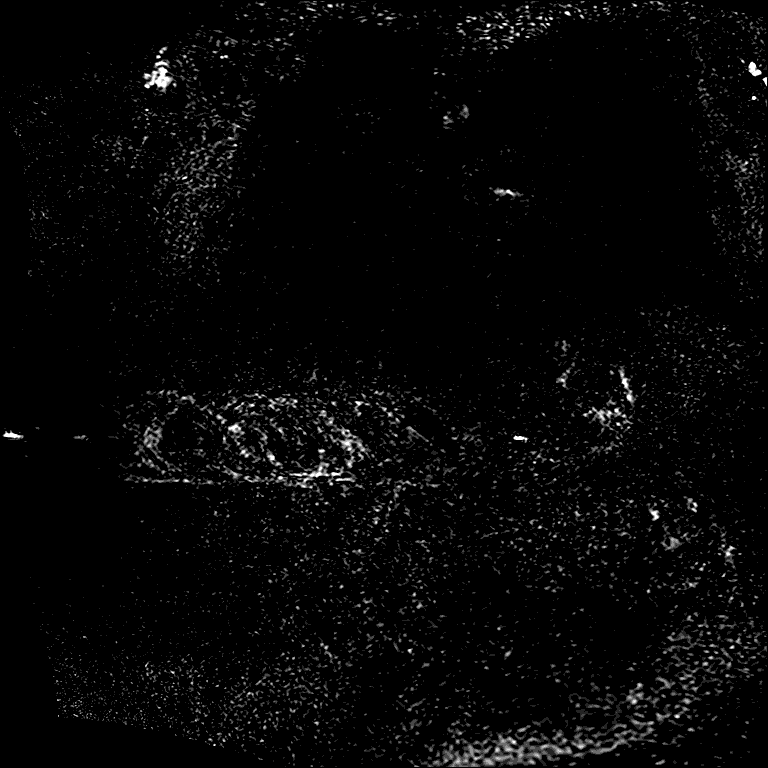
[im 72/72]
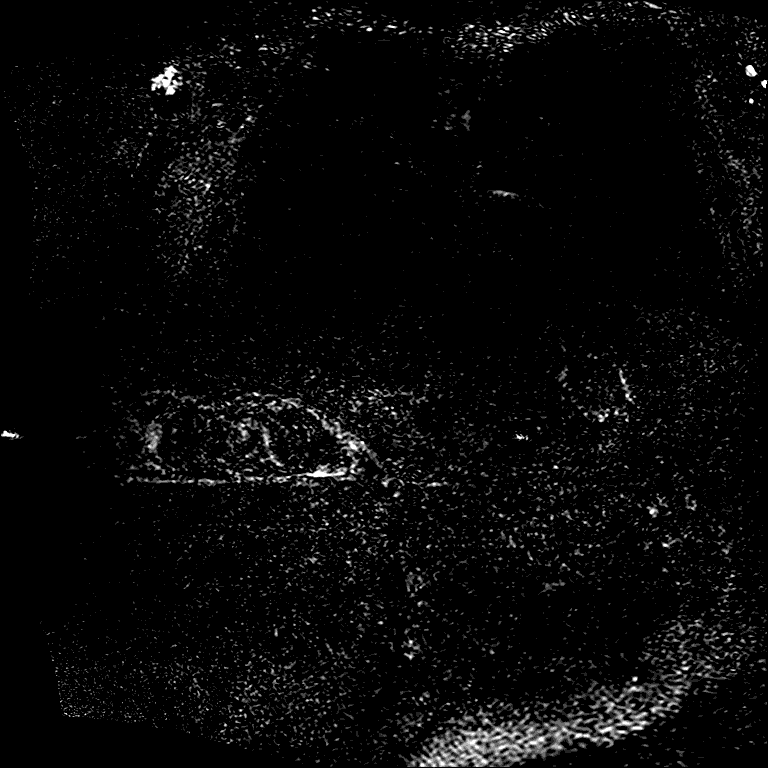

[Series 14: T2 · axial · 6.0mm · 1.56mm/px · 1 of 36 slices shown (2 of 2)]
[im 1/36]
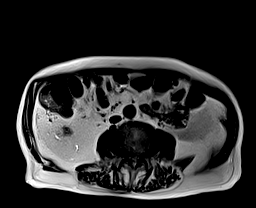

[Series 16: T1 dynamic · axial · 3.0mm · 1.25mm/px · z∈[-108,+148]mm · 3 of 88 slices shown (1 of 10)]
[im 1/88]
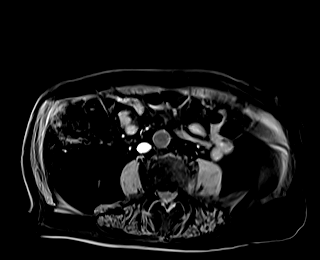
[im 44/88]
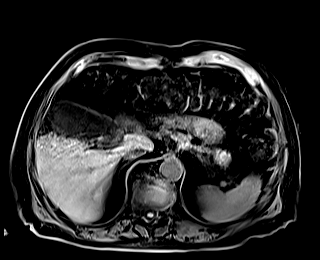
[im 88/88]
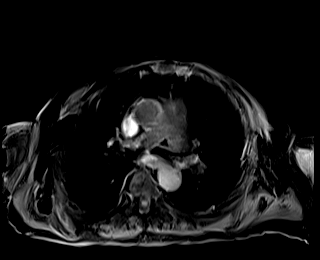

[Series 20: T1 dynamic · axial · 3.0mm · 1.25mm/px · z∈[-108,+148]mm · 3 of 88 slices shown (2 of 10)]
[im 1/88]
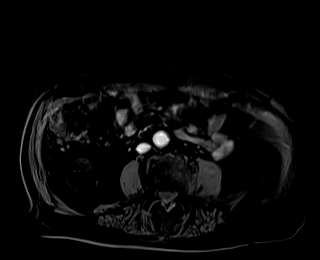
[im 44/88]
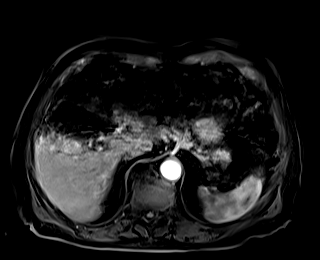
[im 88/88]
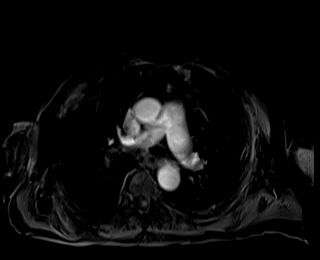

[Series 21: T1 dynamic · axial · 3.0mm · 1.25mm/px · z∈[-108,+148]mm · 3 of 88 slices shown (3 of 10)]
[im 1/88]
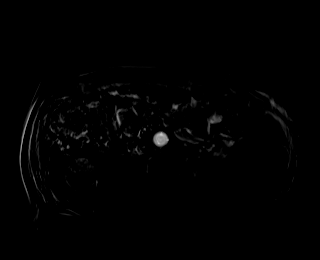
[im 44/88]
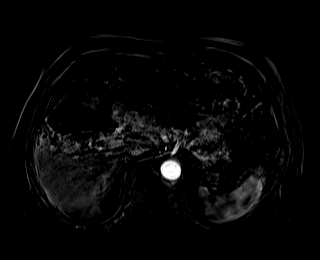
[im 88/88]
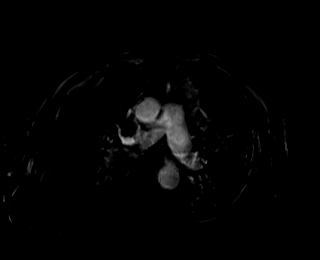

[Series 24: T1 dynamic · axial · 3.0mm · 1.25mm/px · z∈[-108,+148]mm · 3 of 88 slices shown (4 of 10)]
[im 1/88]
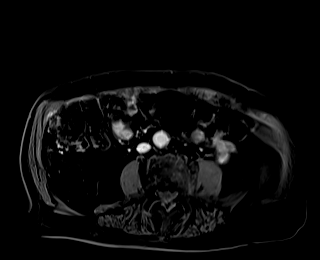
[im 44/88]
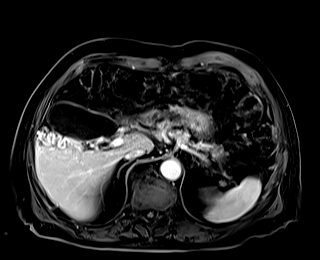
[im 88/88]
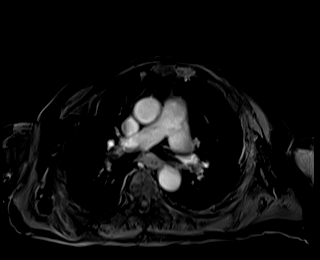

[Series 25: T1 dynamic · axial · 3.0mm · 1.25mm/px · z∈[-108,+148]mm · 3 of 88 slices shown (5 of 10)]
[im 1/88]
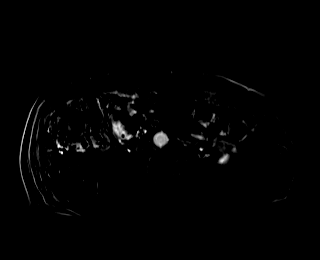
[im 44/88]
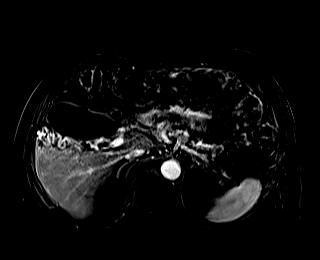
[im 88/88]
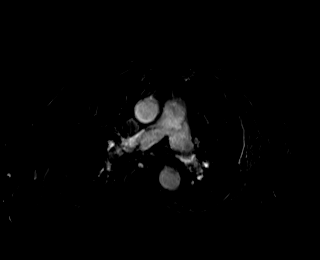

[Series 28: T1 dynamic · axial · 3.0mm · 1.25mm/px · z∈[-108,+148]mm · 3 of 88 slices shown (6 of 10)]
[im 1/88]
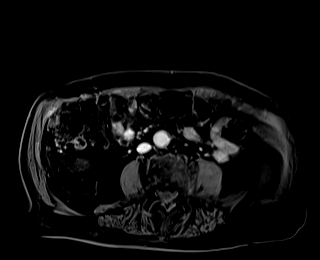
[im 44/88]
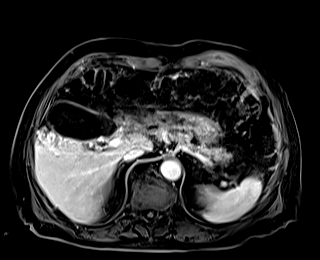
[im 88/88]
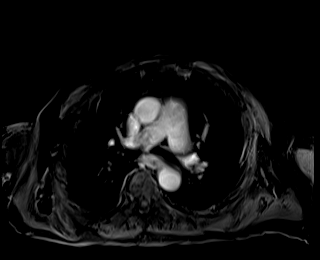

[Series 29: T1 dynamic · axial · 3.0mm · 1.25mm/px · z∈[-108,+148]mm · 3 of 88 slices shown (7 of 10)]
[im 1/88]
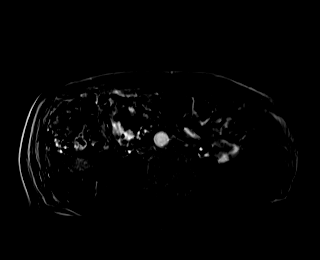
[im 44/88]
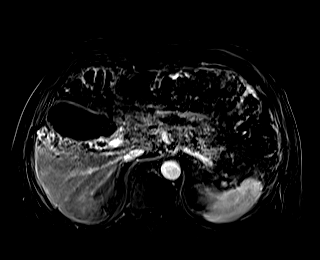
[im 88/88]
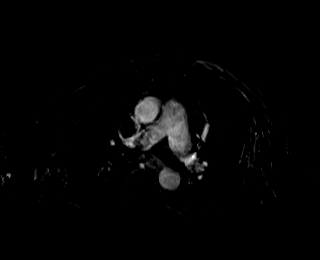

[Series 31: T1 dynamic · coronal · 3.0mm · 1.41mm/px · 3 of 80 slices shown (8 of 10)]
[im 1/80]
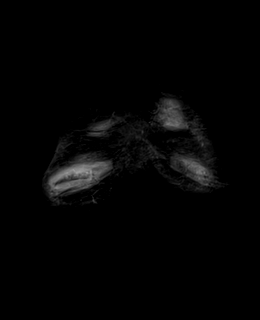
[im 40/80]
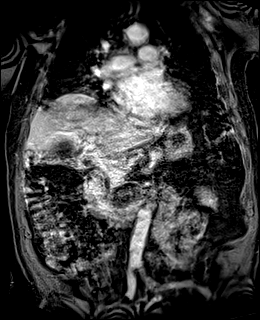
[im 80/80]
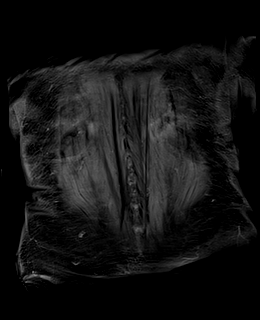

[Series 34: T1 dynamic · axial · 3.0mm · 1.25mm/px · z∈[-108,+148]mm · 3 of 88 slices shown (9 of 10)]
[im 1/88]
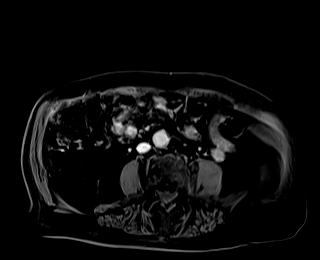
[im 44/88]
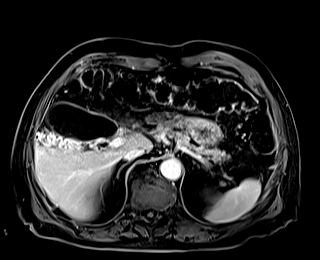
[im 88/88]
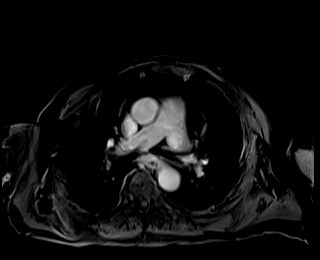

[Series 35: T1 dynamic · axial · 3.0mm · 1.25mm/px · 1 of 88 slices shown (10 of 10)]
[im 1/88]
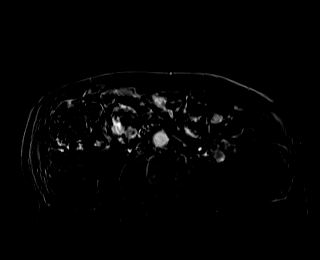

[43 of 48 positions shown; findings below may reference images not displayed]

FINDINGS: Motion degraded images.

Lower chest: Lung bases are clear.

Hepatobiliary: Liver is within normal limits. No
suspicious/enhancing hepatic lesions.

Gallbladder is mildly distended but grossly unremarkable. No
cholelithiasis, gallbladder wall thickening, pericholecystic fluid,
or inflammatory changes.

No intrahepatic ductal dilatation. Common duct measures 9 mm, within
the upper limits of normal. However, there is a 13 mm distal CBD
stone (series 5/image 19).

Pancreas:  Within normal limits.

Spleen:  Within normal limits.

Adrenals/Urinary Tract:  Adrenal glands are within normal limits.

Multiple bilateral renal cysts, measuring up to 1.7 cm in the medial
left upper kidney (series 14/image 22), benign (Bosniak I). Left
renal sinus cysts. No hydronephrosis.

Stomach/Bowel: Stomach is within normal limits.

Visualized bowel is unremarkable.

Vascular/Lymphatic:  No evidence of abdominal aortic aneurysm.

No suspicious abdominal lymphadenopathy.

Other:  No abdominal ascites.

Musculoskeletal: Degenerative changes of the visualized
thoracolumbar spine.
IMPRESSION: Motion degraded images.

Choledocholithiasis with a 13 mm distal CBD stone. Common duct
measures 9 mm, within the upper limits of normal. No intrahepatic
ductal dilatation.

No cholelithiasis or associated inflammatory changes.

## 2021-09-05 DIAGNOSIS — N179 Acute kidney failure, unspecified: Secondary | ICD-10-CM | POA: Diagnosis not present

## 2021-09-05 DIAGNOSIS — M6281 Muscle weakness (generalized): Secondary | ICD-10-CM | POA: Diagnosis not present

## 2021-09-05 DIAGNOSIS — I5033 Acute on chronic diastolic (congestive) heart failure: Secondary | ICD-10-CM | POA: Diagnosis not present

## 2021-09-05 DIAGNOSIS — U071 COVID-19: Secondary | ICD-10-CM | POA: Diagnosis not present

## 2021-09-07 IMAGING — RF DG ERCP WO/W SPHINCTEROTOMY
1 series · 15 of 24 positions shown · non-contrast
Comparison: MRCP 10/20/2020

CLINICAL DATA: Choledocholithiasis

EXAM:
ERCP
TECHNIQUE: Multiple spot images obtained with the fluoroscopic device and
submitted for interpretation post-procedure.
FLUOROSCOPY TIME:  Fluoroscopy Time:  3 minutes 10 seconds
Radiation Exposure Index (if provided by the fluoroscopic device):
40.5 mGy
Number of Acquired Spot Images: 0

[Series 1: run · 15 of 25 slices shown]
[im 1/25]
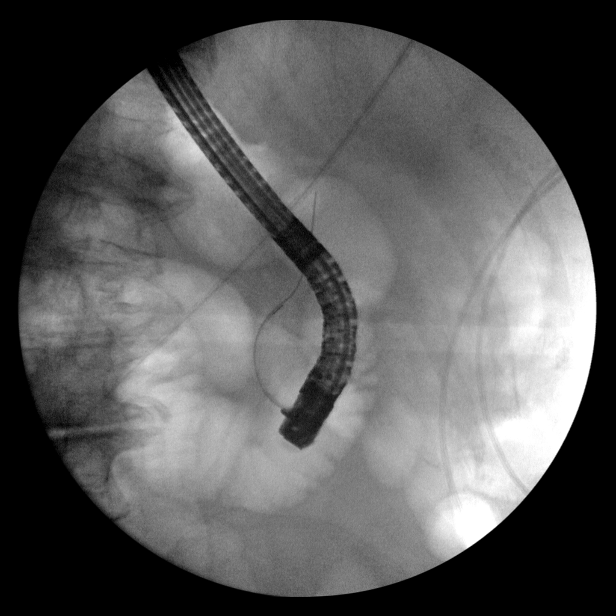
[im 3/25]
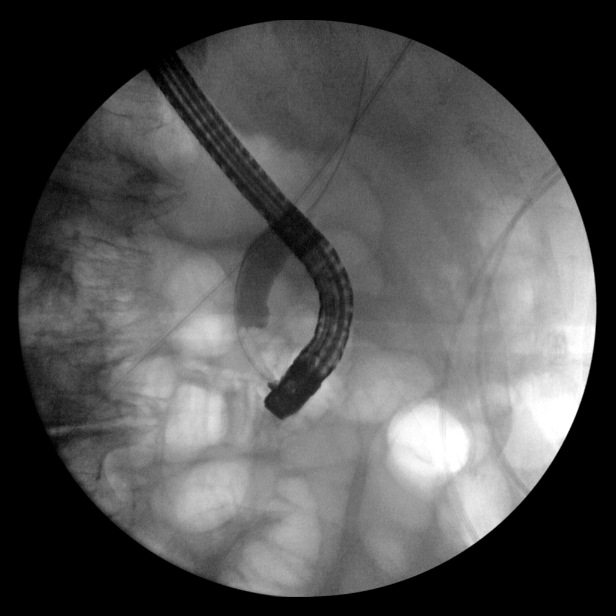
[im 5/25]
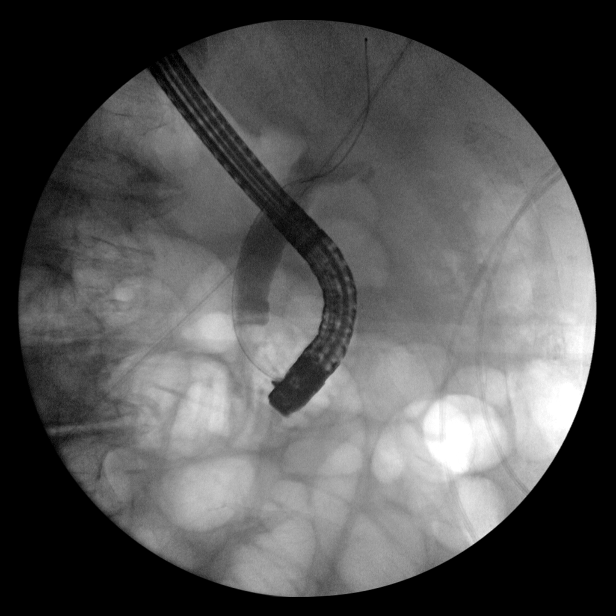
[im 6/25]
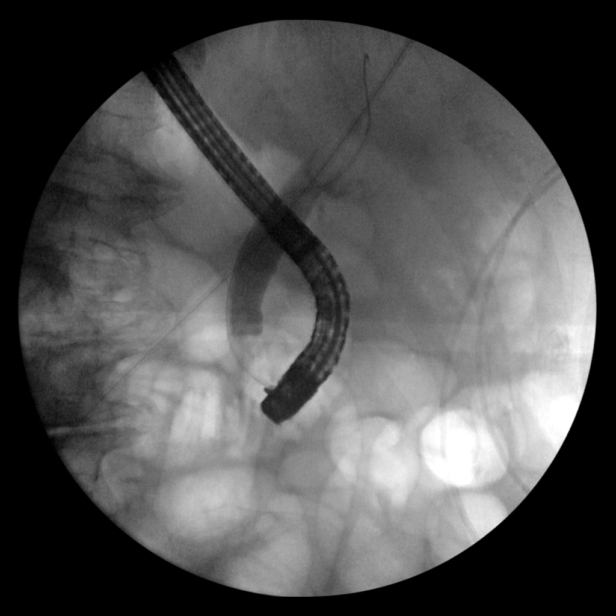
[im 8/25]
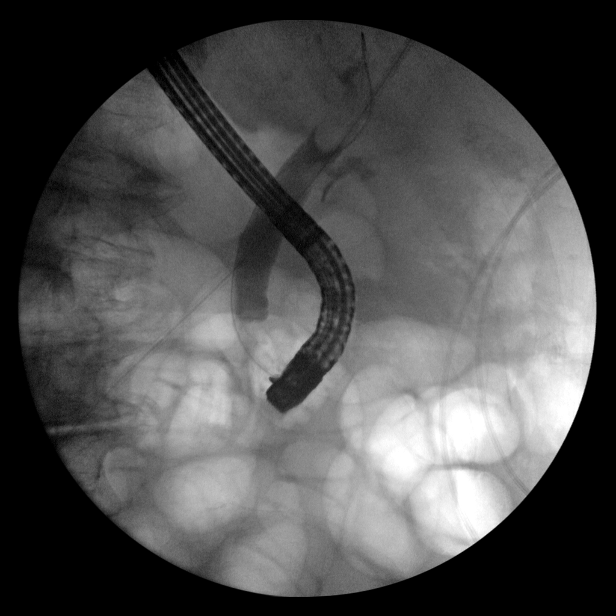
[im 9/25]
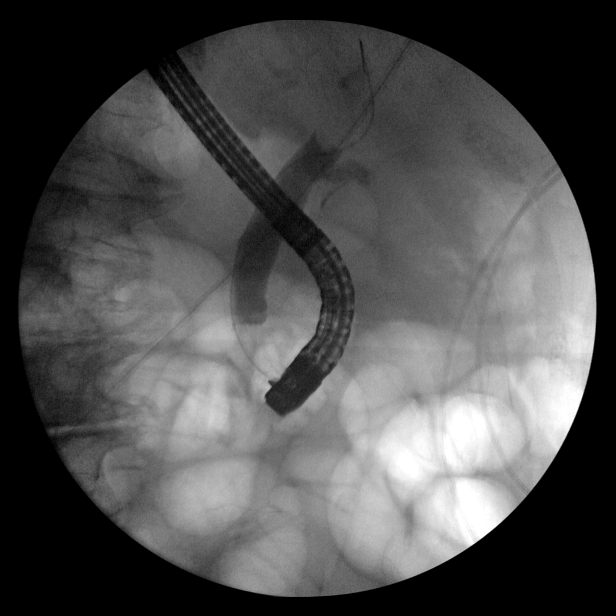
[im 11/25]
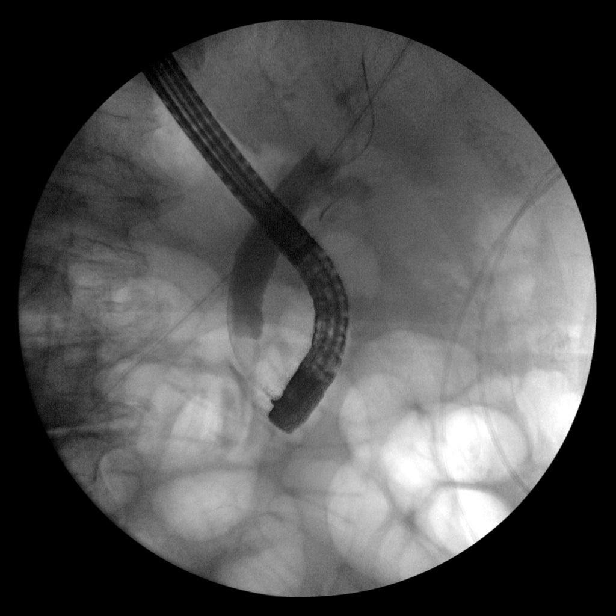
[im 13/25]
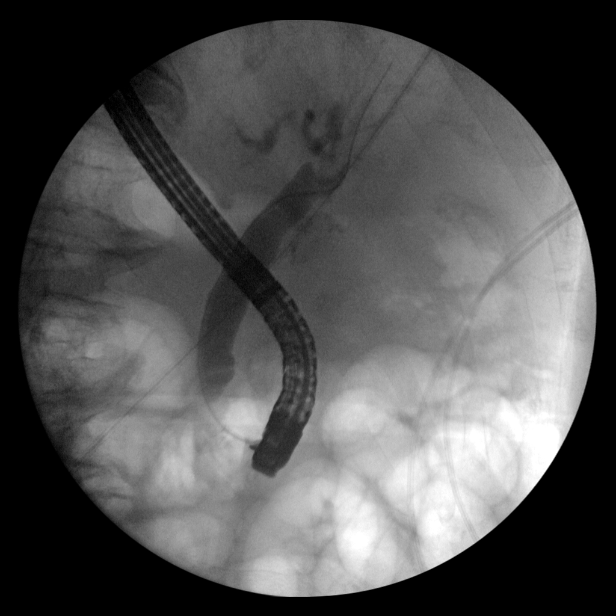
[im 14/25]
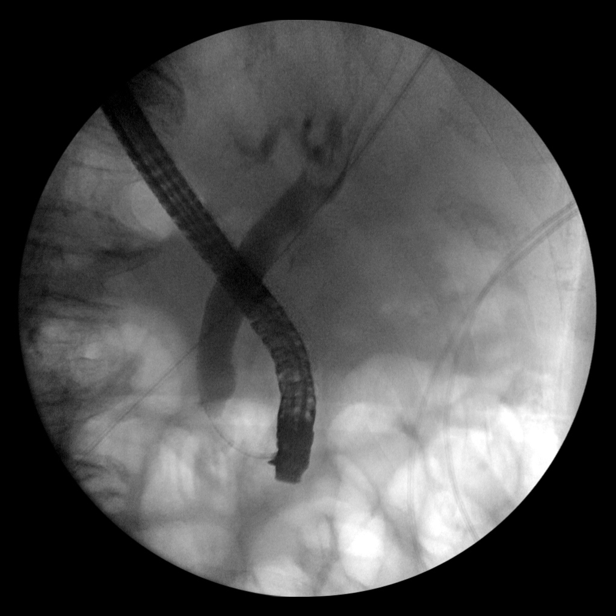
[im 16/25]
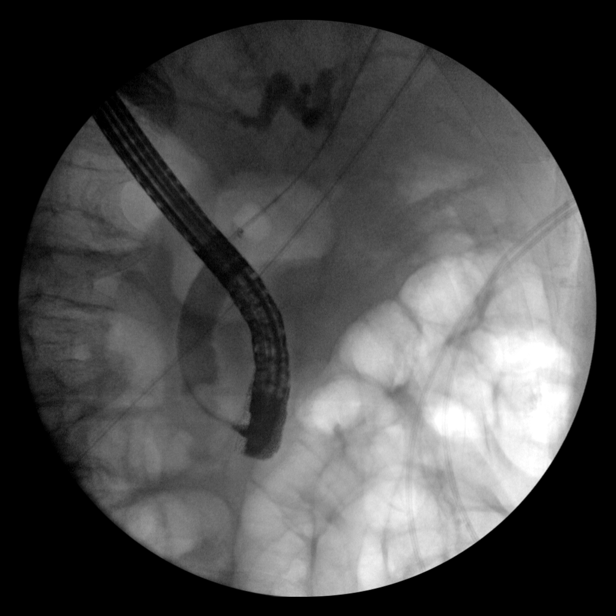
[im 17/25]
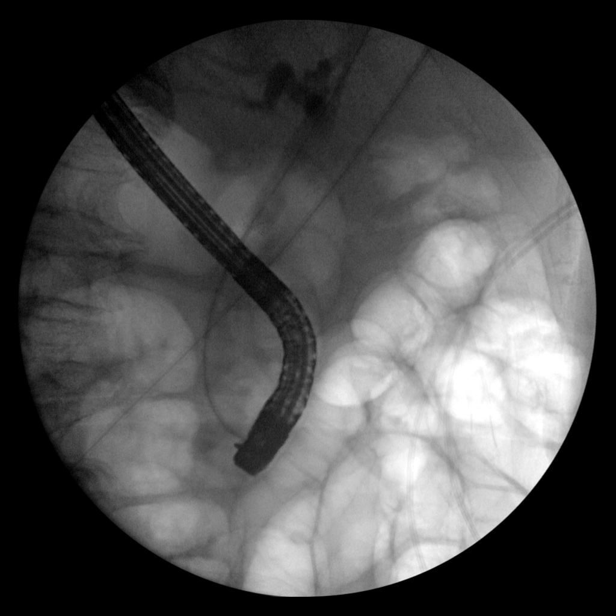
[im 19/25]
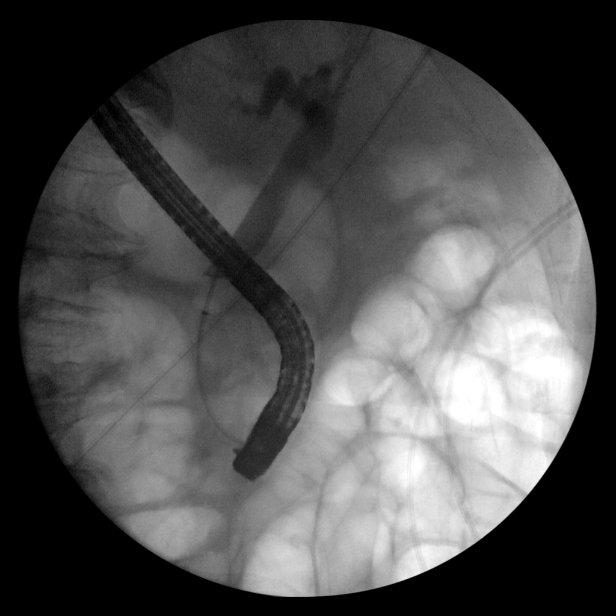
[im 21/25]
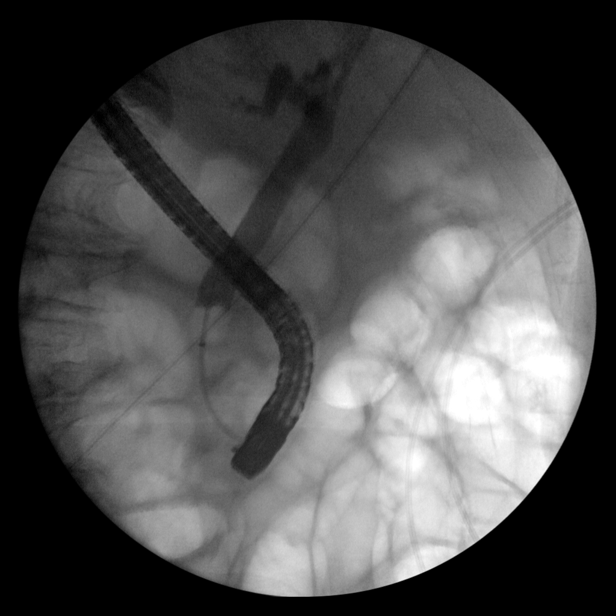
[im 22/25]
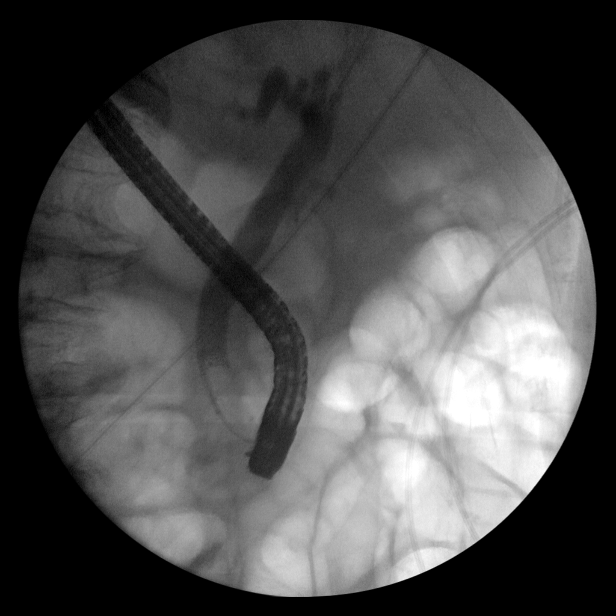
[im 25/25]
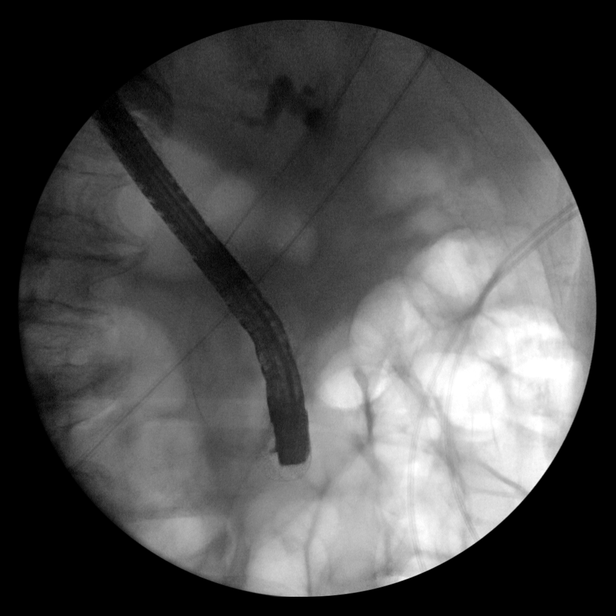

[15 of 24 positions shown; findings below may reference images not displayed]

FINDINGS: Twenty-five intraoperative saved images are submitted for review.
The images demonstrate a flexible duodenal scope in the descending
duodenum with wire cannulation of the common bile duct followed by
sphincterotomy, cholangiography and balloon sweeping of the common
bile duct. A filling defect is present in the distal common bile
duct at the beginning of the study but is no longer evident on the
final images.
IMPRESSION: 1. Choledocholithiasis.
2. ERCP with sphincterotomy and balloon sweeping of the common duct.

These images were submitted for radiologic interpretation only.
Please see the procedural report for the amount of contrast and the
fluoroscopy time utilized.

## 2021-09-17 DIAGNOSIS — N390 Urinary tract infection, site not specified: Secondary | ICD-10-CM | POA: Diagnosis not present

## 2021-09-19 DIAGNOSIS — N179 Acute kidney failure, unspecified: Secondary | ICD-10-CM | POA: Diagnosis not present

## 2021-09-19 DIAGNOSIS — N390 Urinary tract infection, site not specified: Secondary | ICD-10-CM | POA: Diagnosis not present

## 2021-09-25 DIAGNOSIS — B351 Tinea unguium: Secondary | ICD-10-CM | POA: Diagnosis not present

## 2021-09-25 DIAGNOSIS — I7091 Generalized atherosclerosis: Secondary | ICD-10-CM | POA: Diagnosis not present

## 2021-12-31 ENCOUNTER — Emergency Department (HOSPITAL_COMMUNITY): Payer: Medicare Other

## 2021-12-31 ENCOUNTER — Emergency Department (HOSPITAL_BASED_OUTPATIENT_CLINIC_OR_DEPARTMENT_OTHER): Payer: Medicare Other

## 2021-12-31 ENCOUNTER — Inpatient Hospital Stay (HOSPITAL_COMMUNITY)
Admission: EM | Admit: 2021-12-31 | Discharge: 2022-01-04 | DRG: 253 | Disposition: A | Discharge status: Skilled Nursing Facility | Payer: Medicare Other | Source: Skilled Nursing Facility | Attending: Internal Medicine | Admitting: Internal Medicine

## 2021-12-31 ENCOUNTER — Other Ambulatory Visit: Payer: Self-pay

## 2021-12-31 ENCOUNTER — Encounter (HOSPITAL_COMMUNITY): Payer: Self-pay

## 2021-12-31 DIAGNOSIS — G8929 Other chronic pain: Secondary | ICD-10-CM | POA: Diagnosis present

## 2021-12-31 DIAGNOSIS — N179 Acute kidney failure, unspecified: Secondary | ICD-10-CM | POA: Diagnosis not present

## 2021-12-31 DIAGNOSIS — Z7901 Long term (current) use of anticoagulants: Secondary | ICD-10-CM

## 2021-12-31 DIAGNOSIS — F03A0 Unspecified dementia, mild, without behavioral disturbance, psychotic disturbance, mood disturbance, and anxiety: Secondary | ICD-10-CM | POA: Diagnosis present

## 2021-12-31 DIAGNOSIS — E876 Hypokalemia: Secondary | ICD-10-CM | POA: Diagnosis not present

## 2021-12-31 DIAGNOSIS — F03B0 Unspecified dementia, moderate, without behavioral disturbance, psychotic disturbance, mood disturbance, and anxiety: Secondary | ICD-10-CM

## 2021-12-31 DIAGNOSIS — L538 Other specified erythematous conditions: Secondary | ICD-10-CM | POA: Diagnosis not present

## 2021-12-31 DIAGNOSIS — L0390 Cellulitis, unspecified: Secondary | ICD-10-CM | POA: Diagnosis present

## 2021-12-31 DIAGNOSIS — R600 Localized edema: Secondary | ICD-10-CM | POA: Diagnosis not present

## 2021-12-31 DIAGNOSIS — E1122 Type 2 diabetes mellitus with diabetic chronic kidney disease: Secondary | ICD-10-CM | POA: Diagnosis present

## 2021-12-31 DIAGNOSIS — Z8551 Personal history of malignant neoplasm of bladder: Secondary | ICD-10-CM | POA: Diagnosis not present

## 2021-12-31 DIAGNOSIS — M1990 Unspecified osteoarthritis, unspecified site: Secondary | ICD-10-CM | POA: Diagnosis present

## 2021-12-31 DIAGNOSIS — I4821 Permanent atrial fibrillation: Secondary | ICD-10-CM | POA: Diagnosis present

## 2021-12-31 DIAGNOSIS — Z86018 Personal history of other benign neoplasm: Secondary | ICD-10-CM | POA: Diagnosis not present

## 2021-12-31 DIAGNOSIS — I70262 Atherosclerosis of native arteries of extremities with gangrene, left leg: Secondary | ICD-10-CM | POA: Diagnosis present

## 2021-12-31 DIAGNOSIS — F0390 Unspecified dementia without behavioral disturbance: Secondary | ICD-10-CM | POA: Diagnosis present

## 2021-12-31 DIAGNOSIS — E11628 Type 2 diabetes mellitus with other skin complications: Secondary | ICD-10-CM | POA: Diagnosis present

## 2021-12-31 DIAGNOSIS — Z66 Do not resuscitate: Secondary | ICD-10-CM | POA: Diagnosis not present

## 2021-12-31 DIAGNOSIS — E1152 Type 2 diabetes mellitus with diabetic peripheral angiopathy with gangrene: Secondary | ICD-10-CM | POA: Diagnosis not present

## 2021-12-31 DIAGNOSIS — E7800 Pure hypercholesterolemia, unspecified: Secondary | ICD-10-CM

## 2021-12-31 DIAGNOSIS — Z9049 Acquired absence of other specified parts of digestive tract: Secondary | ICD-10-CM | POA: Diagnosis not present

## 2021-12-31 DIAGNOSIS — M7989 Other specified soft tissue disorders: Secondary | ICD-10-CM | POA: Diagnosis not present

## 2021-12-31 DIAGNOSIS — Z8546 Personal history of malignant neoplasm of prostate: Secondary | ICD-10-CM

## 2021-12-31 DIAGNOSIS — D649 Anemia, unspecified: Secondary | ICD-10-CM | POA: Diagnosis present

## 2021-12-31 DIAGNOSIS — L03116 Cellulitis of left lower limb: Secondary | ICD-10-CM | POA: Diagnosis not present

## 2021-12-31 DIAGNOSIS — N189 Chronic kidney disease, unspecified: Secondary | ICD-10-CM | POA: Diagnosis not present

## 2021-12-31 DIAGNOSIS — H3530 Unspecified macular degeneration: Secondary | ICD-10-CM | POA: Diagnosis not present

## 2021-12-31 DIAGNOSIS — Z87442 Personal history of urinary calculi: Secondary | ICD-10-CM

## 2021-12-31 DIAGNOSIS — H548 Legal blindness, as defined in USA: Secondary | ICD-10-CM | POA: Diagnosis not present

## 2021-12-31 DIAGNOSIS — Z79899 Other long term (current) drug therapy: Secondary | ICD-10-CM | POA: Diagnosis not present

## 2021-12-31 DIAGNOSIS — E785 Hyperlipidemia, unspecified: Secondary | ICD-10-CM | POA: Diagnosis present

## 2021-12-31 DIAGNOSIS — Z89412 Acquired absence of left great toe: Secondary | ICD-10-CM | POA: Diagnosis not present

## 2021-12-31 DIAGNOSIS — N1831 Chronic kidney disease, stage 3a: Secondary | ICD-10-CM | POA: Diagnosis present

## 2021-12-31 DIAGNOSIS — I739 Peripheral vascular disease, unspecified: Secondary | ICD-10-CM

## 2021-12-31 DIAGNOSIS — I5032 Chronic diastolic (congestive) heart failure: Secondary | ICD-10-CM | POA: Diagnosis not present

## 2021-12-31 HISTORY — DX: Unspecified macular degeneration: H35.30

## 2021-12-31 LAB — CBC WITH DIFFERENTIAL/PLATELET
Abs Immature Granulocytes: 0.02 10*3/uL (ref 0.00–0.07)
Basophils Absolute: 0 10*3/uL (ref 0.0–0.1)
Basophils Relative: 1 %
Eosinophils Absolute: 0.2 10*3/uL (ref 0.0–0.5)
Eosinophils Relative: 2 %
HCT: 33.1 % — ABNORMAL LOW (ref 39.0–52.0)
Hemoglobin: 10.3 g/dL — ABNORMAL LOW (ref 13.0–17.0)
Immature Granulocytes: 0 %
Lymphocytes Relative: 12 %
Lymphs Abs: 1 10*3/uL (ref 0.7–4.0)
MCH: 31 pg (ref 26.0–34.0)
MCHC: 31.1 g/dL (ref 30.0–36.0)
MCV: 99.7 fL (ref 80.0–100.0)
Monocytes Absolute: 0.7 10*3/uL (ref 0.1–1.0)
Monocytes Relative: 8 %
Neutro Abs: 6.6 10*3/uL (ref 1.7–7.7)
Neutrophils Relative %: 77 %
Platelets: 313 10*3/uL (ref 150–400)
RBC: 3.32 MIL/uL — ABNORMAL LOW (ref 4.22–5.81)
RDW: 14.1 % (ref 11.5–15.5)
WBC: 8.6 10*3/uL (ref 4.0–10.5)
nRBC: 0 % (ref 0.0–0.2)

## 2021-12-31 LAB — COMPREHENSIVE METABOLIC PANEL
ALT: 25 U/L (ref 0–44)
AST: 35 U/L (ref 15–41)
Albumin: 3.5 g/dL (ref 3.5–5.0)
Alkaline Phosphatase: 122 U/L (ref 38–126)
Anion gap: 12 (ref 5–15)
BUN: 37 mg/dL — ABNORMAL HIGH (ref 8–23)
CO2: 26 mmol/L (ref 22–32)
Calcium: 9.2 mg/dL (ref 8.9–10.3)
Chloride: 100 mmol/L (ref 98–111)
Creatinine, Ser: 1.53 mg/dL — ABNORMAL HIGH (ref 0.61–1.24)
GFR, Estimated: 41 mL/min — ABNORMAL LOW (ref >60)
Glucose, Bld: 139 mg/dL — ABNORMAL HIGH (ref 70–99)
Potassium: 3.3 mmol/L — ABNORMAL LOW (ref 3.5–5.1)
Sodium: 138 mmol/L (ref 135–145)
Total Bilirubin: 0.4 mg/dL (ref 0.3–1.2)
Total Protein: 7.5 g/dL (ref 6.5–8.1)

## 2021-12-31 LAB — LACTIC ACID, PLASMA: Lactic Acid, Venous: 2.2 mmol/L (ref 0.5–1.9)

## 2021-12-31 LAB — SEDIMENTATION RATE: Sed Rate: 70 mm/hr — ABNORMAL HIGH (ref 0–16)

## 2021-12-31 MED ORDER — MELATONIN 3 MG PO TABS
3.0000 mg | ORAL_TABLET | Freq: Every day | ORAL | Status: DC
  Administered 2022-01-01 – 2022-01-03 (×3): 3 mg via ORAL
  Filled 2021-12-31 (×3): qty 1

## 2021-12-31 MED ORDER — SODIUM CHLORIDE 0.9 % IV BOLUS
500.0000 mL | Freq: Once | INTRAVENOUS | Status: AC
  Administered 2021-12-31: 500 mL via INTRAVENOUS

## 2021-12-31 MED ORDER — SODIUM CHLORIDE 0.9 % IV SOLN: INTRAVENOUS | Status: AC

## 2021-12-31 MED ORDER — SODIUM CHLORIDE 0.9% FLUSH
3.0000 mL | Freq: Two times a day (BID) | INTRAVENOUS | Status: DC
  Administered 2021-12-31 – 2022-01-02 (×4): 3 mL via INTRAVENOUS

## 2021-12-31 MED ORDER — APIXABAN 2.5 MG PO TABS: 2.5000 mg | ORAL_TABLET | Freq: Two times a day (BID) | ORAL | Status: DC

## 2021-12-31 MED ORDER — METRONIDAZOLE 500 MG PO TABS: 500.0000 mg | ORAL_TABLET | Freq: Two times a day (BID) | ORAL | Status: DC

## 2021-12-31 MED ORDER — POTASSIUM CHLORIDE 20 MEQ PO PACK
40.0000 meq | PACK | Freq: Once | ORAL | Status: AC
  Administered 2021-12-31: 40 meq via ORAL
  Filled 2021-12-31: qty 2

## 2021-12-31 MED ORDER — QUETIAPINE FUMARATE 25 MG PO TABS
12.5000 mg | ORAL_TABLET | Freq: Every day | ORAL | Status: DC
  Administered 2022-01-01 – 2022-01-03 (×3): 12.5 mg via ORAL
  Filled 2021-12-31 (×3): qty 1

## 2021-12-31 MED ORDER — ENSURE ENLIVE PO LIQD
237.0000 mL | Freq: Two times a day (BID) | ORAL | Status: DC
  Administered 2022-01-01 – 2022-01-04 (×6): 237 mL via ORAL
  Filled 2021-12-31 (×3): qty 237

## 2021-12-31 MED ORDER — SODIUM CHLORIDE 0.9 % IV SOLN
2.0000 g | INTRAVENOUS | Status: DC
  Administered 2021-12-31 – 2022-01-03 (×4): 2 g via INTRAVENOUS
  Filled 2021-12-31 (×4): qty 20

## 2021-12-31 MED ORDER — POLYETHYLENE GLYCOL 3350 17 G PO PACK: 17.0000 g | PACK | Freq: Every day | ORAL | Status: DC | PRN

## 2021-12-31 MED ORDER — ACETAMINOPHEN 325 MG PO TABS: 650.0000 mg | ORAL_TABLET | Freq: Four times a day (QID) | ORAL | Status: DC | PRN

## 2021-12-31 MED ORDER — ACETAMINOPHEN 650 MG RE SUPP: 650.0000 mg | Freq: Four times a day (QID) | RECTAL | Status: DC | PRN

## 2021-12-31 MED ORDER — ENOXAPARIN SODIUM 30 MG/0.3ML IJ SOSY
30.0000 mg | PREFILLED_SYRINGE | INTRAMUSCULAR | Status: DC
  Administered 2021-12-31: 30 mg via SUBCUTANEOUS
  Filled 2021-12-31: qty 0.3

## 2021-12-31 MED ORDER — TAMSULOSIN HCL 0.4 MG PO CAPS
0.4000 mg | ORAL_CAPSULE | Freq: Every day | ORAL | Status: DC
  Administered 2022-01-01 – 2022-01-03 (×3): 0.4 mg via ORAL
  Filled 2021-12-31 (×3): qty 1

## 2021-12-31 MED ORDER — PANTOPRAZOLE SODIUM 40 MG PO TBEC
40.0000 mg | DELAYED_RELEASE_TABLET | Freq: Every day | ORAL | Status: DC
  Administered 2022-01-01 – 2022-01-04 (×4): 40 mg via ORAL
  Filled 2021-12-31 (×4): qty 1

## 2021-12-31 NOTE — ED Notes (Signed)
Fabio Neighbors [phone] call whenever he is moved to a room

## 2021-12-31 NOTE — Progress Notes (Signed)
Lower extremity venous left study completed.  Preliminary results relayed to Knapp, MD.   See CV Proc for preliminary results report.   Kell Ferris, RDMS, RVT  

## 2021-12-31 NOTE — Progress Notes (Signed)
Pt's chart has a blood product refusal noted on it. Pt states he will take blood if it is needed. Pt's daughter is at the bedside.  Doroteo Bradford BSN, RN-BC Throughput Nurse 12/31/2021 8:57 PM

## 2021-12-31 NOTE — H&P (Addendum)
History and Physical   Cory Hansen ZJQ:[id number] DOB: 04-17-1920 DOA: 12/31/2021  PCP: Hoyt Koch, MD   Patient coming from: [facility]  Chief Complaint: Foot infection  HPI: Cory Hansen is a 88 y.o. male with medical history significant of dementia, hyperlipidemia, chronic pain, history of prostate and bladder cancer, atrial fibrillation, diastolic heart failure, anemia, CKD 3, blindness presenting with concern for foot infection.  Patient brought by his daughter.  Reportedly he is blind and he thought there was something on his left foot that he tried to pick off.  He likely pulled off some skin and opened the wound.  He has had redness and swelling at the area of his left second toe and foot.  He was started on p.o. antibiotics at facility for the past 3 days but has continued to worsen.  Of note he is status post amputation of his great toe on the left.  She denies fevers, chills, chest pain, shortness of breath, abdominal pain, constipation, diarrhea, nausea, vomiting.  ED Course: Vital signs in ED significant for blood pressure in the 240X to 735H systolic.  Lab work-up included CMP with potassium 3.3, BUN 37, creatinine elevated to 1.53 from baseline of 1.1, glucose 139.  CBC with no leukocytosis but does have hemoglobin stable at 10.3.  CRP pending.  ESR elevated at 70.  Lactic acid mildly elevated at 2.2.  Blood cultures pending.  Doppler study negative for DVT.  Left foot x-ray showed patient is status post left first ray amputation and soft tissue edema noted.  No acute bony abnormality noted.  Patient received ceftriaxone and Flagyl in the ED.  Review of Systems: As per HPI otherwise all other systems reviewed and are negative.  Past Medical History:  Diagnosis Date   Acute encephalopathy 05/31/2021   Acute respiratory failure with hypoxia (HCC) 06/01/2021   Arthritis    Atrial fibrillation ([place])    Choledocholithiasis 10/20/2020   Kidney stones    Macular  degeneration disease    Sepsis due to pneumonia ([place]) 05/31/2021   Shingles     Past Surgical History:  Procedure Laterality Date   AMPUTATION Left 02/22/2014   Procedure: INCISION AND DRAINAGE LEFT FOREFOOT AMPUTATION FIRST RAY;  Surgeon: Wylene Simmer, MD;  Location: [place];  Service: Orthopedics;  Laterality: Left;   APPENDECTOMY     ERCP N/A 10/25/2020   Procedure: ENDOSCOPIC RETROGRADE CHOLANGIOPANCREATOGRAPHY (ERCP);  Surgeon: Clarene Essex, MD;  Location: [facility];  Service: Endoscopy;  Laterality: N/A;   EYE SURGERY Bilateral    cataract surgery w/ lens implant   HERNIA REPAIR     inguinal hernia repair   REMOVAL OF STONES  10/25/2020   Procedure: REMOVAL OF STONES;  Surgeon: Clarene Essex, MD;  Location: [facility];  Service: Endoscopy;;   SPHINCTEROTOMY  10/25/2020   Procedure: Joan Mayans;  Surgeon: Clarene Essex, MD;  Location: [facility];  Service: Endoscopy;;    Social History  reports that he has never smoked. He has never used smokeless tobacco. He reports that he does not currently use alcohol. He reports that he does not use drugs.  No Known Allergies  Family History  Problem Relation Age of Onset   Heart disease Neg Hx        No heart disease in parents  Reviewed on admission  Prior to Admission medications   Medication Sig Start Date End Date Taking? Authorizing Provider  docusate sodium (COLACE) 100 MG capsule Take 100 mg by mouth at bedtime.  [provider]  ELIQUIS 5 MG TABS tablet TAKE 1 TABLET BY MOUTH  TWICE DAILY Patient taking differently: Take 5 mg by mouth 2 (two) times daily. 12/08/20   Hoyt Koch, MD  feeding supplement (ENSURE ENLIVE / ENSURE PLUS) LIQD Take 237 mLs by mouth 2 (two) times daily between meals. 06/04/21   Antonieta Pert, MD  ferrous sulfate 325 (65 FE) MG tablet Take 325 mg by mouth daily with breakfast.    [provider]  furosemide (LASIX) 20 MG tablet Take 20 mg by mouth daily as needed for fluid.     [provider]  melatonin 3 MG TABS tablet Take 1 tablet (3 mg total) by mouth at bedtime. 06/04/21   Antonieta Pert, MD  Misc Natural Products (GLUCOSAMINE-CHONDROITIN PLUS PO) Take 1 tablet by mouth daily.    [provider]  pantoprazole (PROTONIX) 40 MG tablet TAKE 1 TABLET BY MOUTH  DAILY Patient taking differently: Take 40 mg by mouth daily. 01/17/21   Hoyt Koch, MD  polyethylene glycol (MIRALAX / GLYCOLAX) 17 g packet Take 17 g by mouth daily. 06/04/21   Antonieta Pert, MD  QUEtiapine (SEROQUEL) 25 MG tablet Take 0.5 tablets (12.5 mg total) by mouth at bedtime. 06/04/21   Antonieta Pert, MD  senna-docusate (SENOKOT-S) 8.6-50 MG tablet Take 1 tablet by mouth at bedtime as needed for mild constipation. 06/04/21   Antonieta Pert, MD  tamsulosin (FLOMAX) 0.4 MG CAPS capsule Take 0.4 mg by mouth at bedtime. 10/12/20   [provider]    Physical Exam: Vitals:   12/31/21 1915 12/31/21 1945 12/31/21 2000 12/31/21 2100  BP: (!) 116/91 (!) 108/56 109/66 113/68  Pulse: (!) 37 85 81 61  Resp: '20 15 13 15  ' Temp: 98.4 F (36.9 C)     TempSrc: Oral     SpO2: 97% 100% 100% (!) 80%  Weight:      Height:        Physical Exam Constitutional:      General: He is not in acute distress.    Appearance: Normal appearance.  HENT:     Head: Normocephalic and atraumatic.     Mouth/Throat:     Mouth: Mucous membranes are moist.     Pharynx: Oropharynx is clear.  Eyes:     Extraocular Movements: Extraocular movements intact.     Pupils: Pupils are equal, round, and reactive to light.  Cardiovascular:     Rate and Rhythm: Normal rate and regular rhythm.     Pulses: Normal pulses.     Heart sounds: Normal heart sounds.  Pulmonary:     Effort: Pulmonary effort is normal. No respiratory distress.     Breath sounds: Normal breath sounds.  Abdominal:     General: Bowel sounds are normal. There is no distension.     Palpations: Abdomen is soft.     Tenderness: There is no  abdominal tenderness.  Musculoskeletal:        General: Swelling present. No deformity.     Comments: Erythema, edema, warmth at left second digit and foot.  Skin:    General: Skin is warm and dry.  Neurological:     General: No focal deficit present.     Mental Status: Mental status is at baseline.    Labs on Admission: I have personally reviewed following labs and imaging studies  CBC: Recent Labs  Lab 12/31/21 1652  WBC 8.6  NEUTROABS 6.6  HGB 10.3*  HCT 33.1*  MCV  99.7  PLT 295    Basic Metabolic Panel: Recent Labs  Lab 12/31/21 1652  NA 138  K 3.3*  CL 100  CO2 26  GLUCOSE 139*  BUN 37*  CREATININE 1.53*  CALCIUM 9.2    GFR: Estimated Creatinine Clearance: 27.3 mL/min (A) (by C-G formula based on SCr of 1.53 mg/dL (H)).  Liver Function Tests: Recent Labs  Lab 12/31/21 1652  AST 35  ALT 25  ALKPHOS 122  BILITOT 0.4  PROT 7.5  ALBUMIN 3.5    Urine analysis:    Component Value Date/Time   COLORURINE YELLOW 05/31/2021 2000   APPEARANCEUR CLEAR 05/31/2021 2000   LABSPEC 1.014 05/31/2021 2000   PHURINE 5.0 05/31/2021 2000   GLUCOSEU NEGATIVE 05/31/2021 2000   GLUCOSEU NEGATIVE 10/31/2017 1407   HGBUR NEGATIVE 05/31/2021 2000   BILIRUBINUR NEGATIVE 05/31/2021 2000   BILIRUBINUR 1+ 05/31/2021 1643   [place] 05/31/2021 2000   PROTEINUR NEGATIVE 05/31/2021 2000   PROTEINUR Positive (A) 05/31/2021 1643   UROBILINOGEN 0.2 05/31/2021 1643   UROBILINOGEN 0.2 10/31/2017 1407   NITRITE NEGATIVE 05/31/2021 2000   NITRITE negative 05/31/2021 1643   LEUKOCYTESUR NEGATIVE 05/31/2021 2000   LEUKOCYTESUR Negative 05/31/2021 1643    Radiological Exams on Admission: VAS [place] LOWER EXTREMITY VENOUS (DVT) (7a-7p)  Result Date: 12/31/2021  Lower Venous DVT Study Patient Name:  Cory Hansen  Date of Exam:   12/31/2021 Medical Rec #: [id number]       Accession #:    [id number] Date of Birth: 06-Jan-1921      Patient Gender: M Patient Age:   42 years  Exam Location[facility]  [facility] Procedure:      VAS [place] LOWER EXTREMITY VENOUS (DVT) Referring Phys: Wille Glaser KNAPP --------------------------------------------------------------------------------  Indications: Left leg swelling and erythema, patient with second toe wound, history of great toe amp.  Limitations: Acoustic shadowing secondary to overlying arterial calcification. Comparison Study: No prior studies. Performing Technologist: Darlin Coco RDMS, RVT  Examination Guidelines: A complete evaluation includes B-mode imaging, spectral Doppler, color Doppler, and power Doppler as needed of all accessible portions of each vessel. Bilateral testing is considered an integral part of a complete examination. Limited examinations for reoccurring indications may be performed as noted. The reflux portion of the exam is performed with the patient in reverse Trendelenburg.  +-----+---------------+---------+-----------+----------+--------------+ RIGHTCompressibilityPhasicitySpontaneityPropertiesThrombus Aging +-----+---------------+---------+-----------+----------+--------------+ CFV  Full           Yes      Yes                                 +-----+---------------+---------+-----------+----------+--------------+   +---------+---------------+---------+-----------+----------+--------------+ LEFT     CompressibilityPhasicitySpontaneityPropertiesThrombus Aging +---------+---------------+---------+-----------+----------+--------------+ CFV      Full           Yes      Yes                                 +---------+---------------+---------+-----------+----------+--------------+ SFJ      Full                                                        +---------+---------------+---------+-----------+----------+--------------+ FV Prox  Full                                                        +---------+---------------+---------+-----------+----------+--------------+  FV Mid   Full                                                         +---------+---------------+---------+-----------+----------+--------------+ FV DistalFull                                                        +---------+---------------+---------+-----------+----------+--------------+ PFV      Full                                                        +---------+---------------+---------+-----------+----------+--------------+ POP      Full           Yes      Yes                                 +---------+---------------+---------+-----------+----------+--------------+ PTV      Full                                                        +---------+---------------+---------+-----------+----------+--------------+ PERO     Full                                                        +---------+---------------+---------+-----------+----------+--------------+ Gastroc  Full                                                        +---------+---------------+---------+-----------+----------+--------------+    Summary: RIGHT: - No evidence of common femoral vein obstruction.  LEFT: - There is no evidence of deep vein thrombosis in the lower extremity.  - No cystic structure found in the popliteal fossa.  - Incidental: PTA and DPA patent with monophasic flow. Area of focal stenosis visualized in the distal superficial femoral.  *See table(s) above for measurements and observations.    Preliminary    DG Foot Complete Left  Result Date: 12/31/2021 CLINICAL DATA:  Pain and swelling, status post great toe amputation EXAM: LEFT FOOT - COMPLETE 3+ VIEW COMPARISON:  None Available. FINDINGS: Status post left first metatarsal ray amputation. There is no evidence of fracture or dislocation. Mild midfoot arthrosis. Diffuse soft tissue edema about the foot. IMPRESSION: 1.  Status post left first metatarsal ray amputation. 2.  No fracture or dislocation of the left foot. 3.  Diffuse soft tissue edema about  the foot. Electronically Signed   By: Delanna Ahmadi M.D.   On: 12/31/2021 17:20  EKG: Not performed in the emergency department Assessment/Plan Principal Problem:   Cellulitis Active Problems:   Hyperlipidemia   NEOPLASM, MALIGNANT, BLADDER, HX OF   Hx of benign neoplasm of prostate   Acute renal failure superimposed on stage 3a chronic kidney disease (HCC)   Permanent atrial fibrillation (HCC)   Chronic diastolic CHF (congestive heart failure) (HCC)   Dementia (HCC)   Normocytic anemia   Cellulitis Status post left first ray amputation > Patient presenting with presumed diabetic foot infection with erythema, edema, warmth of left second toe and foot after likely tearing off some skin accidentally.  Has not improved despite p.o. antibiotics at facility. > No leukocytosis in the ED however ESR is elevated CRP is pending.  Left foot x-ray did not show bony abnormality but did show soft tissue edema.  Doppler study also pending as ordered in the ED. > Started on ceftriaxone and Flagyl in the ED - Monitor on telemetry for now - Continue with ceftriaxone - Trend fever curve and WBC - Follow-up blood cultures  AKI on CKD 3A Hypokalemia > Patient noted to have creatinine elevated 1.53 from baseline around 1.1. > Also noted to have potassium of 3.3. - Monitor on telemetry - IV fluids overnight - 40 mEq p.o. potassium - Check magnesium  Dementia - Continue home Seroquel  Atrial fibrillation - Per MAR from facility does not appear to be on Eliquis any longer  History of bladder and prostate cancer - Continue home tamsulosin  Diastolic CHF > Last echo in 2022 showed EF 55-60%, indeterminate diastolic function, normal RV function. - Hold off on Lasix for now given AKI  Anemia > Hemoglobin stable at 10.3 - Trend CBC  Legal Blindness - Noted   DVT prophylaxis: Lovenox Code Status:   DNR  Family Communication:  Updated at bedside Disposition Plan:   Patient is  from:  [place] to:  [place] date:  1 to 2 days  Anticipated DC barriers: None  Consults called:  None Admission status:  Observation, telemetry  Severity of Illness: The appropriate patient status for this patient is OBSERVATION. Observation status is judged to be reasonable and necessary in order to provide the required intensity of service to ensure the patient's safety. The patient's presenting symptoms, physical exam findings, and initial radiographic and laboratory data in the context of their medical condition is felt to place them at decreased risk for further clinical deterioration. Furthermore, it is anticipated that the patient will be medically stable for discharge from the hospital within 2 midnights of admission.    Marcelyn Bruins MD [facility]  How to contact the [facility] Attending or Consulting provider [place] or covering provider during after hours [place], for this patient?   Check the care team in [facility] and look for a) attending/consulting [facility] provider listed and b) the [facility] team listed Log into [email or web address] and use [place]'s universal password to access. If you do not have the password, please contact the hospital operator. Locate the [facility] provider you are looking for under [facility] and page to a number that you can be directly reached. If you still have difficulty reaching the provider, please page the [facility] (Director on Call) for the Hospitalists listed on amion for assistance.  12/31/2021, 9:14 PM

## 2021-12-31 NOTE — ED Provider Notes (Signed)
[facility]-EMERGENCY DEPT Provider Note   CSN: [id number] Arrival date & time: 12/31/21  1524     History  Chief Complaint  Patient presents with   Foot Injury    Cory Hansen is a 88 y.o. male.   Foot Injury    Patient presents to the ER for evaluation of increasing swelling and redness of the left foot.  Patient has history of prior amputation of the toe on that foot.  Daughter states patient is blind and has difficulty hearing.  He most likely pulled a piece of skin off of his toe.  He has had some swelling over the last week since that time.  He was started on antibiotics 3 days ago.  Staff at the facility sent him to the hospital for further treatment because of increasing redness despite antibiotic.  No known fevers.  Home Medications Prior to Admission medications   Medication Sig Start Date End Date Taking? Authorizing Provider  docusate sodium (COLACE) 100 MG capsule Take 100 mg by mouth at bedtime.    [provider]  ELIQUIS 5 MG TABS tablet TAKE 1 TABLET BY MOUTH  TWICE DAILY Patient taking differently: Take 5 mg by mouth 2 (two) times daily. 12/08/20   [name], MD  feeding supplement (ENSURE ENLIVE / ENSURE PLUS) LIQD Take 237 mLs by mouth 2 (two) times daily between meals. 06/04/21   Lanae Boast, MD  ferrous sulfate 325 (65 FE) MG tablet Take 325 mg by mouth daily with breakfast.    [provider]  furosemide (LASIX) 20 MG tablet Take 20 mg by mouth daily as needed for fluid.    [provider]  melatonin 3 MG TABS tablet Take 1 tablet (3 mg total) by mouth at bedtime. 06/04/21   Lanae Boast, MD  Misc Natural Products (GLUCOSAMINE-CHONDROITIN PLUS PO) Take 1 tablet by mouth daily.    [provider]  pantoprazole (PROTONIX) 40 MG tablet TAKE 1 TABLET BY MOUTH  DAILY Patient taking differently: Take 40 mg by mouth daily. 01/17/21   [name], MD  polyethylene glycol (MIRALAX / GLYCOLAX) 17  g packet Take 17 g by mouth daily. 06/04/21   Lanae Boast, MD  QUEtiapine (SEROQUEL) 25 MG tablet Take 0.5 tablets (12.5 mg total) by mouth at bedtime. 06/04/21   Lanae Boast, MD  senna-docusate (SENOKOT-S) 8.6-50 MG tablet Take 1 tablet by mouth at bedtime as needed for mild constipation. 06/04/21   Lanae Boast, MD  tamsulosin (FLOMAX) 0.4 MG CAPS capsule Take 0.4 mg by mouth at bedtime. 10/12/20   [provider]      Allergies    Patient has no known allergies.    Review of Systems   Review of Systems  Physical Exam Updated Vital Signs BP (!) 108/56   Pulse 85   Temp 98.4 F (36.9 C) (Oral)   Resp 15   Ht 1.702 m (5\' 7" )   Wt 79.8 kg   SpO2 100%   BMI 27.57 kg/m  Physical Exam Vitals and nursing note reviewed.  Constitutional:      Appearance: He is well-developed.     Comments: Elderly, frail  HENT:     Head: Normocephalic and atraumatic.     Right Ear: External ear normal.     Left Ear: External ear normal.  Eyes:     General: No scleral icterus.       Right eye: No discharge.  Left eye: No discharge.     Conjunctiva/sclera: Conjunctivae normal.  Neck:     Trachea: No tracheal deviation.  Cardiovascular:     Rate and Rhythm: Normal rate.  Pulmonary:     Effort: Pulmonary effort is normal. No respiratory distress.     Breath sounds: No stridor.  Abdominal:     General: There is no distension.     Tenderness: There is no abdominal tenderness.  Musculoskeletal:        General: Swelling and tenderness present. No deformity.     Cervical back: Neck supple.     Comments: Dark eschar noted on the dorsal aspect of the second toe of the left foot, surrounding erythema and edema, edema does extend up to the left calf  Skin:    General: Skin is warm and dry.     Findings: No rash.  Neurological:     Mental Status: He is alert.     Cranial Nerves: Cranial nerve deficit: no gross deficits.     ED Results / Procedures / Treatments   Labs (all labs ordered  are listed, but only abnormal results are displayed) Labs Reviewed  COMPREHENSIVE METABOLIC PANEL - Abnormal; Notable for the following components:      Result Value   Potassium 3.3 (*)    Glucose, Bld 139 (*)    BUN 37 (*)    Creatinine, Ser 1.53 (*)    GFR, Estimated 41 (*)    All other components within normal limits  CBC WITH DIFFERENTIAL/PLATELET - Abnormal; Notable for the following components:   RBC 3.32 (*)    Hemoglobin 10.3 (*)    HCT 33.1 (*)    All other components within normal limits  LACTIC ACID, PLASMA - Abnormal; Notable for the following components:   Lactic Acid, Venous 2.2 (*)    All other components within normal limits  SEDIMENTATION RATE - Abnormal; Notable for the following components:   Sed Rate 70 (*)    All other components within normal limits  CULTURE, BLOOD (ROUTINE X 2)  CULTURE, BLOOD (ROUTINE X 2)  C-REACTIVE PROTEIN    EKG None  Radiology VAS [place] LOWER EXTREMITY VENOUS (DVT) (7a-7p)  Result Date: 12/31/2021  Lower Venous DVT Study Patient Name:  Cory Hansen  Date of Exam:   12/31/2021 Medical Rec #: [id number]       Accession #:    [id number] Date of Birth: 11/22/1920      Patient Gender: M Patient Age:   48 years Exam Location:  [facility] Procedure:      VAS [place] LOWER EXTREMITY VENOUS (DVT) Referring Phys: Wille Glaser Kuulei Kleier --------------------------------------------------------------------------------  Indications: Left leg swelling and erythema, patient with second toe wound, history of great toe amp.  Limitations: Acoustic shadowing secondary to overlying arterial calcification. Comparison Study: No prior studies. Performing Technologist: Darlin Coco RDMS, RVT  Examination Guidelines: A complete evaluation includes B-mode imaging, spectral Doppler, color Doppler, and power Doppler as needed of all accessible portions of each vessel. Bilateral testing is considered an integral part of a complete examination. Limited examinations for reoccurring  indications may be performed as noted. The reflux portion of the exam is performed with the patient in reverse Trendelenburg.  +-----+---------------+---------+-----------+----------+--------------+ RIGHTCompressibilityPhasicitySpontaneityPropertiesThrombus Aging +-----+---------------+---------+-----------+----------+--------------+ CFV  Full           Yes      Yes                                 +-----+---------------+---------+-----------+----------+--------------+   +---------+---------------+---------+-----------+----------+--------------+  LEFT     CompressibilityPhasicitySpontaneityPropertiesThrombus Aging +---------+---------------+---------+-----------+----------+--------------+ CFV      Full           Yes      Yes                                 +---------+---------------+---------+-----------+----------+--------------+ SFJ      Full                                                        +---------+---------------+---------+-----------+----------+--------------+ FV Prox  Full                                                        +---------+---------------+---------+-----------+----------+--------------+ FV Mid   Full                                                        +---------+---------------+---------+-----------+----------+--------------+ FV DistalFull                                                        +---------+---------------+---------+-----------+----------+--------------+ PFV      Full                                                        +---------+---------------+---------+-----------+----------+--------------+ POP      Full           Yes      Yes                                 +---------+---------------+---------+-----------+----------+--------------+ PTV      Full                                                        +---------+---------------+---------+-----------+----------+--------------+ PERO     Full                                                         +---------+---------------+---------+-----------+----------+--------------+ Gastroc  Full                                                        +---------+---------------+---------+-----------+----------+--------------+  Summary: RIGHT: - No evidence of common femoral vein obstruction.  LEFT: - There is no evidence of deep vein thrombosis in the lower extremity.  - No cystic structure found in the popliteal fossa.  - Incidental: PTA and DPA patent with monophasic flow. Area of focal stenosis visualized in the distal superficial femoral.  *See table(s) above for measurements and observations.    Preliminary    DG Foot Complete Left  Result Date: 12/31/2021 CLINICAL DATA:  Pain and swelling, status post great toe amputation EXAM: LEFT FOOT - COMPLETE 3+ VIEW COMPARISON:  None Available. FINDINGS: Status post left first metatarsal ray amputation. There is no evidence of fracture or dislocation. Mild midfoot arthrosis. Diffuse soft tissue edema about the foot. IMPRESSION: 1.  Status post left first metatarsal ray amputation. 2.  No fracture or dislocation of the left foot. 3.  Diffuse soft tissue edema about the foot. Electronically Signed   By: Jearld Lesch M.D.   On: 12/31/2021 17:20    Procedures Procedures    Medications Ordered in ED Medications  cefTRIAXone (ROCEPHIN) 2 g in sodium chloride 0.9 % 100 mL IVPB (2 g Intravenous New Bag/Given 12/31/21 1928)    And  metroNIDAZOLE (FLAGYL) tablet 500 mg (has no administration in time range)    ED Course/ Medical Decision Making/ A&P Clinical Course as of 12/31/21 2014  Mon Dec 31, 2021  1917 CBC with Differential(!) No leukocytosis.  Sed rate is significantly elevated.  Lactic acid level is also elevated [JK]  1917 Comprehensive metabolic panel(!) Creatinine increased compared to baseline [JK]  1917 Foot x-ray without acute findings.  Doppler study ordered but it is after hours.   Will need to be done in the morning. [JK]  2014 D/w Dr Alinda Money.  Will admit [JK]    Clinical Course User Index [JK] Linwood Dibbles, MD                           Medical Decision Making Frontal diagnosis includes but not limited to peripheral vascular disease, cellulitis, osteomyelitis, deep venous thrombosis  Problems Addressed: Cellulitis of left lower extremity: acute illness or injury that poses a threat to life or bodily functions  Amount and/or Complexity of Data Reviewed Labs: ordered. Decision-making details documented in ED Course. Radiology: ordered and independent interpretation performed.  Risk Prescription drug management.   Patient presented to ED for evaluation of increasing foot swelling.  Patient's exam is suggestive of cellulitis.  No definitive signs of osteomyelitis at this time.  Patient however does have elevated sed rate and lactic acid level.  Normotensive at this time.  Not tachycardic.  Will proceed with fluid bolus, IV antibiotics.  With his worsening symptoms despite oral antibiotics of laboratory abnormalities.  I will consult the medical service for admission.  He does have significant leg edema most likely related to the cellulitis but I have ordered a Doppler study Doppler study negative for DVT.  PVD noted        Final Clinical Impression(s) / ED Diagnoses Final diagnoses:  Cellulitis of left lower extremity    Rx / DC Orders ED Discharge Orders     None         Linwood Dibbles, MD 12/31/21 2016

## 2021-12-31 NOTE — ED Triage Notes (Signed)
Patient was brought in by his daughter. Patient is a resident of [place].  Patient's daughter reports that the patient is blind and thought he had a piece of tap on his left foot and told her he was pulling it off. The daughter thinks it was a piece of skin on the foot that he pulled off.  Patient was started on oral antibiotics 3 days ago.  Patient has redness and swelling to the 2nd toe and redness and swelling to the left foot. Patient's left big toe has been amputated. Patient is in Hospice.

## 2021-12-31 NOTE — ED Provider Triage Note (Signed)
Emergency Medicine Provider Triage Evaluation Note  Cory Hansen , a 88 y.o. male  was evaluated in triage.  Pt complains of left foot toe infection, patient is blind. Facility first noticed a problem on 12/20/21, hx of prior toe infection, unsure if same toe. Started abx on Friday. Toe looked worse today and prompted visit to ER. Hx a fib, no diabetes.  Review of Systems  Positive: As above Negative: fever  Physical Exam  BP 117/65   Pulse (!) 103   Temp 97.8 F (36.6 C) (Oral)   Resp 16   Ht 5\' 7"  (1.702 m)   Wt 79.8 kg   SpO2 92%   BMI 27.57 kg/m  Gen:   Awake, no distress   Resp:  Normal effort  MSK:    Other:       Medical Decision Making  Medically screening exam initiated at 4:33 PM.  Appropriate orders placed.  Emily Filbert Delatorre was informed that the remainder of the evaluation will be completed by another provider, this initial triage assessment does not replace that evaluation, and the importance of remaining in the ED until their evaluation is complete.     Tacy Learn, PA-C 12/31/21 1640

## 2022-01-01 ENCOUNTER — Inpatient Hospital Stay (HOSPITAL_COMMUNITY): Payer: Medicare Other

## 2022-01-01 DIAGNOSIS — Z87442 Personal history of urinary calculi: Secondary | ICD-10-CM | POA: Diagnosis not present

## 2022-01-01 DIAGNOSIS — I5032 Chronic diastolic (congestive) heart failure: Secondary | ICD-10-CM | POA: Diagnosis present

## 2022-01-01 DIAGNOSIS — E1122 Type 2 diabetes mellitus with diabetic chronic kidney disease: Secondary | ICD-10-CM | POA: Diagnosis present

## 2022-01-01 DIAGNOSIS — I4821 Permanent atrial fibrillation: Secondary | ICD-10-CM | POA: Diagnosis present

## 2022-01-01 DIAGNOSIS — Z8551 Personal history of malignant neoplasm of bladder: Secondary | ICD-10-CM | POA: Diagnosis not present

## 2022-01-01 DIAGNOSIS — Z66 Do not resuscitate: Secondary | ICD-10-CM | POA: Diagnosis present

## 2022-01-01 DIAGNOSIS — N179 Acute kidney failure, unspecified: Secondary | ICD-10-CM | POA: Diagnosis present

## 2022-01-01 DIAGNOSIS — L03116 Cellulitis of left lower limb: Secondary | ICD-10-CM

## 2022-01-01 DIAGNOSIS — Z79899 Other long term (current) drug therapy: Secondary | ICD-10-CM | POA: Diagnosis not present

## 2022-01-01 DIAGNOSIS — Z7901 Long term (current) use of anticoagulants: Secondary | ICD-10-CM | POA: Diagnosis not present

## 2022-01-01 DIAGNOSIS — Z89412 Acquired absence of left great toe: Secondary | ICD-10-CM | POA: Diagnosis not present

## 2022-01-01 DIAGNOSIS — H3530 Unspecified macular degeneration: Secondary | ICD-10-CM | POA: Diagnosis present

## 2022-01-01 DIAGNOSIS — F03B0 Unspecified dementia, moderate, without behavioral disturbance, psychotic disturbance, mood disturbance, and anxiety: Secondary | ICD-10-CM

## 2022-01-01 DIAGNOSIS — Z9049 Acquired absence of other specified parts of digestive tract: Secondary | ICD-10-CM | POA: Diagnosis not present

## 2022-01-01 DIAGNOSIS — E876 Hypokalemia: Secondary | ICD-10-CM | POA: Diagnosis not present

## 2022-01-01 DIAGNOSIS — M1990 Unspecified osteoarthritis, unspecified site: Secondary | ICD-10-CM | POA: Diagnosis present

## 2022-01-01 DIAGNOSIS — E1152 Type 2 diabetes mellitus with diabetic peripheral angiopathy with gangrene: Secondary | ICD-10-CM | POA: Diagnosis present

## 2022-01-01 DIAGNOSIS — I70262 Atherosclerosis of native arteries of extremities with gangrene, left leg: Secondary | ICD-10-CM | POA: Diagnosis present

## 2022-01-01 DIAGNOSIS — L0390 Cellulitis, unspecified: Secondary | ICD-10-CM

## 2022-01-01 DIAGNOSIS — E7800 Pure hypercholesterolemia, unspecified: Secondary | ICD-10-CM | POA: Diagnosis not present

## 2022-01-01 DIAGNOSIS — D649 Anemia, unspecified: Secondary | ICD-10-CM | POA: Diagnosis present

## 2022-01-01 DIAGNOSIS — N1831 Chronic kidney disease, stage 3a: Secondary | ICD-10-CM | POA: Diagnosis present

## 2022-01-01 DIAGNOSIS — F03A0 Unspecified dementia, mild, without behavioral disturbance, psychotic disturbance, mood disturbance, and anxiety: Secondary | ICD-10-CM | POA: Diagnosis present

## 2022-01-01 DIAGNOSIS — E785 Hyperlipidemia, unspecified: Secondary | ICD-10-CM | POA: Diagnosis present

## 2022-01-01 DIAGNOSIS — E11628 Type 2 diabetes mellitus with other skin complications: Secondary | ICD-10-CM | POA: Diagnosis present

## 2022-01-01 DIAGNOSIS — G8929 Other chronic pain: Secondary | ICD-10-CM | POA: Diagnosis present

## 2022-01-01 DIAGNOSIS — H548 Legal blindness, as defined in USA: Secondary | ICD-10-CM | POA: Diagnosis present

## 2022-01-01 DIAGNOSIS — N189 Chronic kidney disease, unspecified: Secondary | ICD-10-CM | POA: Diagnosis not present

## 2022-01-01 DIAGNOSIS — I739 Peripheral vascular disease, unspecified: Secondary | ICD-10-CM

## 2022-01-01 LAB — COMPREHENSIVE METABOLIC PANEL
ALT: 21 U/L (ref 0–44)
AST: 30 U/L (ref 15–41)
Albumin: 2.8 g/dL — ABNORMAL LOW (ref 3.5–5.0)
Alkaline Phosphatase: 96 U/L (ref 38–126)
Anion gap: 8 (ref 5–15)
BUN: 29 mg/dL — ABNORMAL HIGH (ref 8–23)
CO2: 25 mmol/L (ref 22–32)
Calcium: 8.4 mg/dL — ABNORMAL LOW (ref 8.9–10.3)
Chloride: 105 mmol/L (ref 98–111)
Creatinine, Ser: 1.21 mg/dL (ref 0.61–1.24)
GFR, Estimated: 54 mL/min — ABNORMAL LOW (ref >60)
Glucose, Bld: 88 mg/dL (ref 70–99)
Potassium: 3.9 mmol/L (ref 3.5–5.1)
Sodium: 138 mmol/L (ref 135–145)
Total Bilirubin: 0.4 mg/dL (ref 0.3–1.2)
Total Protein: 6.1 g/dL — ABNORMAL LOW (ref 6.5–8.1)

## 2022-01-01 LAB — CBC
HCT: 27.6 % — ABNORMAL LOW (ref 39.0–52.0)
Hemoglobin: 8.7 g/dL — ABNORMAL LOW (ref 13.0–17.0)
MCH: 31.5 pg (ref 26.0–34.0)
MCHC: 31.5 g/dL (ref 30.0–36.0)
MCV: 100 fL (ref 80.0–100.0)
Platelets: 275 10*3/uL (ref 150–400)
RBC: 2.76 MIL/uL — ABNORMAL LOW (ref 4.22–5.81)
RDW: 14.1 % (ref 11.5–15.5)
WBC: 6.9 10*3/uL (ref 4.0–10.5)
nRBC: 0 % (ref 0.0–0.2)

## 2022-01-01 LAB — LACTIC ACID, PLASMA: Lactic Acid, Venous: 1.2 mmol/L (ref 0.5–1.9)

## 2022-01-01 LAB — C-REACTIVE PROTEIN: CRP: 12.5 mg/dL — ABNORMAL HIGH (ref <1.0)

## 2022-01-01 MED ORDER — ENOXAPARIN SODIUM 40 MG/0.4ML IJ SOSY
40.0000 mg | PREFILLED_SYRINGE | INTRAMUSCULAR | Status: DC
  Administered 2022-01-01 – 2022-01-03 (×3): 40 mg via SUBCUTANEOUS
  Filled 2022-01-01 (×3): qty 0.4

## 2022-01-01 NOTE — Assessment & Plan Note (Signed)
Acute kidney injury ruled out.

## 2022-01-01 NOTE — Assessment & Plan Note (Signed)
Failed outpatient antibiotics.  Vitals WNL today.   - Continue Rocephin

## 2022-01-01 NOTE — Assessment & Plan Note (Signed)
Hgb 8.7 no clinical bleeding, within baseline range, 8-10 g/dL

## 2022-01-01 NOTE — Assessment & Plan Note (Signed)
-   Suplement K

## 2022-01-01 NOTE — Progress Notes (Signed)
  Progress Note   Patient: Cory Hansen DVV:[id number] DOB: February 11, 1921 DOA: 12/31/2021     0 DOS: the patient was seen and examined on 01/01/2022 at 8:50AM and 1:11PM      Brief hospital course: Mr. Samantha Crimes is a 88 y.o. M with dementia, lives in facility, permAF on Eliquis, CKD IIIa baseline 1.0-1.2, dCHF and remote hx bladder CA who presented with cellulitis of LEFT foot failed outpatient management.  In the ER, K 3.3, Cr 1.5, WBC normal, Lactate 2.2, VS normal.  DVT US showed no DVT but did show monophasic flow and likely focal stenosis in LEFT SFA.   9/25: Admitted for IV antibiotics 9/26: ABI ordered     Assessment and Plan: * Cellulitis Failed outpatient antibiotics.  Vitals WNL today.   - Continue Rocephin - PT eval    PVD (peripheral vascular disease) ([place]) Has previous left great toe amputation by Dr. Doran Durand in 2015 due to osteomyelitis of the first metatarsal, and septic first MTP joint.    Given [place] last night ruled out DVT but ?SFA stenosis, discussed with vascular surgery.  They recommended ABI to start.  Not on aspirin, statin. - Check ABI    Dementia (HCC) Mild, lives in ALF. - Continue nightly seroquel  Chronic diastolic CHF (congestive heart failure) (HCC) Hyperlipidemia LDL 119 here, not on statin Appears euvolemic - Hold furosemide today, resume tomorrow  Permanent atrial fibrillation ([place]) No longer on apixaban.  Rate controlled.  Stage 3a chronic kidney disease (CKD) ([place]) Acute kidney injury ruled out.  Normocytic anemia Hgb 8.7 no clinical bleeding, within baseline range, 8-10 g/dL  Hypokalemia - Suplement K  History of bladder cancer          Subjective: No fever, chills.  He has some bilateral lower extremity discomfort, he is somewhat vague about where this is, there is still redness of the left foot, and a scar on the great toe.     Physical Exam: BP 106/76   Pulse 86   Temp 98.6 F (37 C) (Oral)   Resp 19    Ht 5\' 7"  (1.702 m)   Wt 79.8 kg   SpO2 100%   BMI 27.57 kg/m   Elderly adult male, lying in bed, no acute distress, interactive and appropriate RRR, no murmurs, no peripheral edema Respiratory rate normal, lungs clear without rales or wheezes Abdomen soft nontender palpation or guarding Attention normal, affect appropriate, judgment insight appear normal, face symmetric, speech fluent     Data Reviewed: Discussed with vascular surgery Hemoglobin trended down to 8.7.  No clinical bleeding, this is dilution Potassium 3.3 up to normal, creatinine 1.5 down to 1.2 CRP elevated Ultrasound of the left lower extremity shows possible SFA stenosis, no DVT Radiograph of the left foot shows no evidence of osteomyelitis  Family Communication: Daughter at the bedside    Disposition: Status is: Inpatient The patient presents with cellulitis that failed outpatient management  This may just be a matter of inpatient antibiotics, but given the finding on ultrasound there is a question of vascular insufficiency.  We will start with ABIs.  We will transfer to [facility], so that the patient can be evaluated by vascular surgery.            Author: Edwin Dada, MD 01/01/2022 1:55 PM  For on call review [email or web address].

## 2022-01-01 NOTE — Assessment & Plan Note (Signed)
Mild, lives in ALF. - Continue nightly seroquel

## 2022-01-01 NOTE — Consult Note (Addendum)
ASSESSMENT & PLAN   PERIPHERAL ARTERIAL DISEASE WITH GANGRENE OF THE LEFT SECOND TOE: This patient has gangrene of the left second toe with evidence of infrainguinal arterial occlusive disease on the left.  He is 88 years old and nonambulatory but is mentally fairly sharp and has been functional otherwise.  His daughter is at the bedside.  I do not think he has adequate circulation to heal this wound nor would he heal a toe amputation.  If the wound progressed significantly he would require primary above-the-knee amputation.  Alternatively we could proceed with arteriography to see if there are any options to improve circulation from an endovascular standpoint.  He would not be a candidate for open surgery.  If he does have disease amenable to angioplasty then he may be able to heal a toe amputation.  Otherwise he will likely ultimately require an above-the-knee amputation.  I have discussed the indications for arteriography and the potential complications with the patient and his daughter and they would like to proceed with this on Thursday if possible.  Hold Eliquis in anticipation of arteriography on Thursday.  CHRONIC KIDNEY DISEASE: His renal function has improved with gentle hydration.  I would continue gentle hydration prior to his arteriogram.  ID: He is on IV Rocephin.  REASON FOR CONSULT:    Gangrene of the left second toe.  The consult is requested by Dr. Maryfrances Bunnell.   HPI:   Cory Hansen is a 88 y.o. male who resides in a skilled nursing facility and at this point is nonambulatory.  He is in a wheelchair.  He is otherwise with it mentally and reasonably active.  He developed a wound on the left second toe several months ago that was initially fairly superficial.  This however has progressed and now he presents with gangrene of the left second toe.  Of note he underwent amputation of the left great toe 2 years ago.  I do not get any clear-cut history of rest pain.  He has had some  pain in the toe itself.  His risk factors for peripheral arterial disease are none.  He denies any history of diabetes, hypertension, hypercholesterolemia, family history of premature cardiovascular disease, or tobacco use.  He does have a history of atrial fibrillation and is on Eliquis.  He has not had any doses of Eliquis since he has been in the emergency department since yesterday around noon time.  Is not clear to me if he got a dose in the morning on 12/31/2021.  Past Medical History:  Diagnosis Date   Acute encephalopathy 05/31/2021   Acute respiratory failure with hypoxia (HCC) 06/01/2021   Arthritis    Atrial fibrillation (HCC)    Choledocholithiasis 10/20/2020   Kidney stones    Macular degeneration disease    Sepsis due to pneumonia (HCC) 05/31/2021   Shingles     Family History  Problem Relation Age of Onset   Heart disease Neg Hx        No heart disease in parents    SOCIAL HISTORY: Social History   Tobacco Use   Smoking status: Never   Smokeless tobacco: Never  Substance Use Topics   Alcohol use: Not Currently    Comment: very rare    No Known Allergies  Current Facility-Administered Medications  Medication Dose Route Frequency Provider Last Rate Last Admin   acetaminophen (TYLENOL) tablet 650 mg  650 mg Oral Q6H PRN Synetta Fail, MD       Or  acetaminophen (TYLENOL) suppository 650 mg  650 mg Rectal Q6H PRN Synetta Fail, MD       cefTRIAXone (ROCEPHIN) 2 g in sodium chloride 0.9 % 100 mL IVPB  2 g Intravenous Q24H Synetta Fail, MD   Stopped at 12/31/21 1958   enoxaparin (LOVENOX) injection 40 mg  40 mg Subcutaneous Q24H Ellington, Abby K, RPH       feeding supplement (ENSURE ENLIVE / ENSURE PLUS) liquid 237 mL  237 mL Oral BID BM Synetta Fail, MD   237 mL at 01/01/22 9150   melatonin tablet 3 mg  3 mg Oral QHS Synetta Fail, MD       pantoprazole (PROTONIX) EC tablet 40 mg  40 mg Oral Daily Synetta Fail, MD   40 mg at  01/01/22 5697   polyethylene glycol (MIRALAX / GLYCOLAX) packet 17 g  17 g Oral Daily PRN Synetta Fail, MD       QUEtiapine (SEROQUEL) tablet 12.5 mg  12.5 mg Oral QHS Synetta Fail, MD       sodium chloride flush (NS) 0.9 % injection 3 mL  3 mL Intravenous Q12H Synetta Fail, MD   3 mL at 12/31/21 2203   tamsulosin (FLOMAX) capsule 0.4 mg  0.4 mg Oral QHS Synetta Fail, MD       Current Outpatient Medications  Medication Sig Dispense Refill   acetaminophen (TYLENOL) 325 MG tablet Take 650 mg by mouth in the morning and at bedtime.     cephALEXin (KEFLEX) 500 MG capsule Take 500 mg by mouth 2 (two) times daily.     feeding supplement (ENSURE ENLIVE / ENSURE PLUS) LIQD Take 237 mLs by mouth 2 (two) times daily between meals. (Patient taking differently: Take 237 mLs by mouth 2 (two) times daily between meals. Chocolate) 237 mL 12   furosemide (LASIX) 40 MG tablet Take 40 mg by mouth 2 (two) times daily.     LORazepam (ATIVAN) 0.5 MG tablet Take 0.5 mg by mouth every 4 (four) hours as needed for anxiety.     melatonin 3 MG TABS tablet Take 1 tablet (3 mg total) by mouth at bedtime.  0   pantoprazole (PROTONIX) 40 MG tablet TAKE 1 TABLET BY MOUTH  DAILY (Patient taking differently: Take 40 mg by mouth daily.) 90 tablet 3   polyethylene glycol (MIRALAX / GLYCOLAX) 17 g packet Take 17 g by mouth daily. 14 each 0   QUEtiapine (SEROQUEL) 25 MG tablet Take 0.5 tablets (12.5 mg total) by mouth at bedtime. (Patient taking differently: Take 25 mg by mouth at bedtime.)     senna-docusate (SENOKOT-S) 8.6-50 MG tablet Take 1 tablet by mouth at bedtime as needed for mild constipation. (Patient taking differently: Take 1 tablet by mouth daily.)     tamsulosin (FLOMAX) 0.4 MG CAPS capsule Take 0.4 mg by mouth at bedtime.     ELIQUIS 5 MG TABS tablet TAKE 1 TABLET BY MOUTH  TWICE DAILY (Patient not taking: Reported on 12/31/2021) 180 tablet 3   ferrous sulfate 325 (65 FE) MG tablet Take 325  mg by mouth daily with breakfast. (Patient not taking: Reported on 12/31/2021)     Misc Natural Products (GLUCOSAMINE-CHONDROITIN PLUS PO) Take 1 tablet by mouth daily. (Patient not taking: Reported on 12/31/2021)      REVIEW OF SYSTEMS:  [X]  denotes positive finding, [ ]  denotes negative finding Cardiac  Comments:  Chest pain or chest pressure:    Shortness  of breath upon exertion:    Short of breath when lying flat:    Irregular heart rhythm:        Vascular    Pain in calf, thigh, or hip brought on by ambulation:    Pain in feet at night that wakes you up from your sleep:     Blood clot in your veins:    Leg swelling:  x Left foot      Pulmonary    Oxygen at home:    Productive cough:     Wheezing:         Neurologic    Sudden weakness in arms or legs:     Sudden numbness in arms or legs:     Sudden onset of difficulty speaking or slurred speech:    Temporary loss of vision in one eye:     Problems with dizziness:         Gastrointestinal    Blood in stool:     Vomited blood:         Genitourinary    Burning when urinating:     Blood in urine:        Psychiatric    Major depression:         Hematologic    Bleeding problems:    Problems with blood clotting too easily:        Skin    Rashes or ulcers: x Gangrene left second toe      Constitutional    Fever or chills:    -  PHYSICAL EXAM:   Vitals:   01/01/22 1009 01/01/22 1330 01/01/22 1412 01/01/22 1700  BP: (!) 107/58 106/76  109/61  Pulse: 78 86  70  Resp: 16 19  18   Temp:   98 F (36.7 C) 98.1 F (36.7 C)  TempSrc:   Oral   SpO2: 96% 100%  100%  Weight:      Height:       Body mass index is 27.57 kg/m. GENERAL: The patient is a well-nourished male, in no acute distress. The vital signs are documented above. CARDIAC: There is a regular rate and rhythm.  VASCULAR: I do not detect carotid bruits. On the right side he has a palpable femoral pulse.  I cannot palpate popliteal or pedal pulses.  He  has monophasic but fairly brisk dorsalis pedis and posterior tibial signals on the right. On the left side, which is the side of concern, he has a palpable femoral pulse.  I cannot palpate popliteal or pedal pulses.  He has a monophasic dorsalis pedis and posterior tibial signal on the left. He has moderate left lower extremity swelling. PULMONARY: There is good air exchange bilaterally without wheezing or rales. ABDOMEN: Soft and non-tender with normal pitched bowel sounds.  I do not palpate any aneurysm. MUSCULOSKELETAL: There are no major deformities. NEUROLOGIC: No focal weakness or paresthesias are detected. SKIN: He has dry gangrene of the left second toe.   PSYCHIATRIC: The patient has a normal affect.  DATA:    ARTERIAL DOPPLER STUDY: I have independently interpreted his arterial Doppler study today.  On the right side he has a monophasic dorsalis pedis and posterior tibial signal.  The arteries are calcified and ABI cannot be obtained.  Toe pressure on the right is 36 mmHg.  On the left side, which is the side of concern, there is a monophasic dorsalis pedis and posterior tibial signal.  ABI is 100% although I think this is falsely  elevated secondary to calcific disease.  Toe pressure could not be obtained as he had a toe amputation.  X-RAY LEFT FOOT: I reviewed the plain x-ray of his left foot.  This shows no fracture to the left foot.  There is diffuse soft tissue edema.  He is status post left first metatarsal ray amputation.  LABS: He has been in the emergency department for 2 days now.  His creatinine yesterday was 1.53.  Creatinine today is 1.21 with a GFR of 54.  Hemoglobin is 8.7.  Hematocrit 27.6.  Platelets 275,000.  White blood cell count 6.9.     Waverly Ferrari [facility]

## 2022-01-01 NOTE — ED Notes (Signed)
[facility] called and transportation setup  

## 2022-01-01 NOTE — ED Notes (Signed)
Report given to Anna RN.

## 2022-01-01 NOTE — Progress Notes (Signed)
ABI completed.  Refer to "CV Proc" under chart review to view preliminary results. Preliminary results discussed with Dr. Loleta Books.  01/01/2022 4:16 PM Kelby Aline., MHA, RVT, RDCS, RDMS

## 2022-01-01 NOTE — Assessment & Plan Note (Addendum)
Given US DVT ruled out DVT but ?SFA stenosis, discussed with vascular surgery.  They recommended ABI to start.  Not on aspirin, statin. - Check ABI

## 2022-01-01 NOTE — Assessment & Plan Note (Signed)
No longer on apixaban.  Rate controlled.

## 2022-01-01 NOTE — Assessment & Plan Note (Addendum)
-   Hold furoemide today

## 2022-01-01 NOTE — Hospital Course (Addendum)
Cory Hansen is a 88 y.o. M with dementia, lives in facility, permAF on Eliquis, CKD IIIa baseline 1.0-1.2, dCHF and remote hx bladder CA who presented with cellulitis of LEFT foot failed outpatient management.  In the ER, K 3.3, Cr 1.5, WBC normal, Lactate 2.2, VS normal.  DVT US showed no DVT but did show monophasic flow and likely focal stenosis in LEFT SFA.   9/25: Admitted for IV antibiotics 9/26: ABI ordered

## 2022-01-01 NOTE — Assessment & Plan Note (Signed)
LDL 119 here, not on statin

## 2022-01-02 DIAGNOSIS — N189 Chronic kidney disease, unspecified: Secondary | ICD-10-CM | POA: Diagnosis not present

## 2022-01-02 DIAGNOSIS — I70262 Atherosclerosis of native arteries of extremities with gangrene, left leg: Secondary | ICD-10-CM | POA: Diagnosis not present

## 2022-01-02 DIAGNOSIS — L03116 Cellulitis of left lower limb: Secondary | ICD-10-CM

## 2022-01-02 LAB — CBC
HCT: 32 % — ABNORMAL LOW (ref 39.0–52.0)
Hemoglobin: 10.2 g/dL — ABNORMAL LOW (ref 13.0–17.0)
MCH: 31.1 pg (ref 26.0–34.0)
MCHC: 31.9 g/dL (ref 30.0–36.0)
MCV: 97.6 fL (ref 80.0–100.0)
Platelets: 287 10*3/uL (ref 150–400)
RBC: 3.28 MIL/uL — ABNORMAL LOW (ref 4.22–5.81)
RDW: 14.3 % (ref 11.5–15.5)
WBC: 8.1 10*3/uL (ref 4.0–10.5)
nRBC: 0 % (ref 0.0–0.2)

## 2022-01-02 LAB — BASIC METABOLIC PANEL
Anion gap: 10 (ref 5–15)
BUN: 23 mg/dL (ref 8–23)
CO2: 26 mmol/L (ref 22–32)
Calcium: 9.3 mg/dL (ref 8.9–10.3)
Chloride: 103 mmol/L (ref 98–111)
Creatinine, Ser: 1.13 mg/dL (ref 0.61–1.24)
GFR, Estimated: 59 mL/min — ABNORMAL LOW (ref >60)
Glucose, Bld: 96 mg/dL (ref 70–99)
Potassium: 4.2 mmol/L (ref 3.5–5.1)
Sodium: 139 mmol/L (ref 135–145)

## 2022-01-02 LAB — MAGNESIUM: Magnesium: 2.3 mg/dL (ref 1.7–2.4)

## 2022-01-02 MED ORDER — ORAL CARE MOUTH RINSE: 15.0000 mL | OROMUCOSAL | Status: DC | PRN

## 2022-01-02 NOTE — Plan of Care (Signed)

## 2022-01-02 NOTE — Progress Notes (Signed)
[facility] [place]  Subjective  - Comfortable   Objective 112/70 76 98 F (36.7 C) (Oral) (!) 21 97% No intake or output data in the 24 hours ending 01/02/22 0643  Left second toe edema with dry gangrene, mild erythema Heart  A fib irregular Lungs non labored breathing  Assessment/Planning: PAD with left second toe dry gangrene Plan for angiogram with possible left LE intervention tomorrow Dr. Scot Dock discussed procedure yesterday and the patient and his daughter agree to proceed. NPO order placed  Roxy Horseman 01/02/2022 6:43 AM --  Laboratory Lab Results: Recent Labs    01/01/22 0426 01/02/22 0551  WBC 6.9 8.1  HGB 8.7* 10.2*  HCT 27.6* 32.0*  PLT 275 287   BMET Recent Labs    01/01/22 0426 01/02/22 0551  NA 138 139  K 3.9 4.2  CL 105 103  CO2 25 26  GLUCOSE 88 96  BUN 29* 23  CREATININE 1.21 1.13  CALCIUM 8.4* 9.3    COAG Lab Results  Component Value Date   INR 1.1 06/19/2020   No results found for: "PTT"

## 2022-01-02 NOTE — Evaluation (Signed)
Physical Therapy Evaluation Patient Details Name: Cory Hansen MRN: [id number] DOB: September 29, 1920 Today's Date: 01/02/2022  History of Present Illness  Pt is a 88 y.o. M who presents 12/31/2021 with gangrene left 2nd toe. Significant PMH: dementia, permAF, CKD IIIa, dCHF, bladder CA.  Clinical Impression  Pt admitted with above. Pt is pleasantly confused and follows one step commands. Pt requiring up to two person moderate assist for sit to stands and pivot transfers. Assisted with meal as pt has visual deficit. Pt presents with generalized weakness, impaired standing balance and decreased activity tolerance. Recommend d/c to SNF.     Recommendations for follow up therapy are one component of a multi-disciplinary discharge planning process, led by the attending physician.  Recommendations may be updated based on patient status, additional functional criteria and insurance authorization.  Follow Up Recommendations Skilled nursing-short term rehab (<3 hours/day) Can patient physically be transported by private vehicle: No    Assistance Recommended at Discharge Frequent or constant Supervision/Assistance  Patient can return home with the following  A lot of help with walking and/or transfers;A lot of help with bathing/dressing/bathroom    Equipment Recommendations None recommended by PT  Recommendations for Other Services       Functional Status Assessment Patient has had a recent decline in their functional status and demonstrates the ability to make significant improvements in function in a reasonable and predictable amount of time.     Precautions / Restrictions Precautions Precautions: Fall;Other (comment) Precaution Comments: blind Restrictions Weight Bearing Restrictions: No      Mobility  Bed Mobility Overal bed mobility: Needs Assistance Bed Mobility: Supine to Sit     Supine to sit: Mod assist     General bed mobility comments: Pt initiating well, bringing BLE's off  edge of bed, modA at trunk to pull up to sitting    Transfers Overall transfer level: Needs assistance Equipment used: Rolling walker (2 wheels) Transfers: Sit to/from Stand, Bed to chair/wheelchair/BSC Sit to Stand: Mod assist, +2 physical assistance Stand pivot transfers: Mod assist, +2 physical assistance         General transfer comment: Pt able to stand from edge of bed to RW x 3 for peri care, requiring modA to rise due to posterior lean. Cues for "bringing feet back," as pt tends to slide forward with transition. Pivoting to chair towards right with assist for steering walker    Ambulation/Gait                  Stairs            Wheelchair Mobility    Modified Rankin (Stroke Patients Only)       Balance Overall balance assessment: Needs assistance Sitting-balance support: Feet supported Sitting balance-Leahy Scale: Fair     Standing balance support: Bilateral upper extremity supported Standing balance-Leahy Scale: Poor Standing balance comment: reliant on RW                             Pertinent Vitals/Pain Pain Assessment Pain Assessment: Faces Faces Pain Scale: Hurts a little bit Pain Location: back Pain Descriptors / Indicators: Discomfort Pain Intervention(s): Monitored during session    Home Living Family/patient expects to be discharged to:: Skilled nursing facility                        Prior Function Prior Level of Function : Needs assist  Mobility Comments: has not ambulated much since 2/23; mostly transfers to and from w/c ADLs Comments: likely needs assist from staff for ADL's     Hand Dominance   Dominant Hand: Right    Extremity/Trunk Assessment   Upper Extremity Assessment Upper Extremity Assessment: Generalized weakness    Lower Extremity Assessment Lower Extremity Assessment: Generalized weakness;LLE deficits/detail LLE Deficits / Details: prior amputation of 1st MT, gangrene  2nd toe with necrosis    Cervical / Trunk Assessment Cervical / Trunk Assessment: Kyphotic  Communication   Communication: HOH  Cognition Arousal/Alertness: Awake/alert Behavior During Therapy: WFL for tasks assessed/performed Overall Cognitive Status: History of cognitive impairments - at baseline                                 General Comments: Hx dementia; overall pleasant and follows commands        General Comments      Exercises     Assessment/Plan    PT Assessment Patient needs continued PT services  PT Problem List Decreased strength;Decreased activity tolerance;Decreased balance;Decreased mobility;Decreased cognition;Decreased safety awareness;Pain;Decreased skin integrity       PT Treatment Interventions DME instruction;Functional mobility training;Therapeutic activities;Therapeutic exercise;Balance training;Patient/family education    PT Goals (Current goals can be found in the Care Plan section)  Acute Rehab PT Goals Patient Stated Goal: did not state PT Goal Formulation: With patient Time For Goal Achievement: 01/16/22 Potential to Achieve Goals: Fair    Frequency Min 2X/week     Co-evaluation               AM-PAC PT "6 Clicks" Mobility  Outcome Measure Help needed turning from your back to your side while in a flat bed without using bedrails?: A Little Help needed moving from lying on your back to sitting on the side of a flat bed without using bedrails?: A Lot Help needed moving to and from a bed to a chair (including a wheelchair)?: A Lot Help needed standing up from a chair using your arms (e.g., wheelchair or bedside chair)?: A Lot Help needed to walk in hospital room?: Total Help needed climbing 3-5 steps with a railing? : Total 6 Click Score: 11    End of Session Equipment Utilized During Treatment: Gait belt Activity Tolerance: Patient tolerated treatment well Patient left: in chair;with call bell/phone within  reach;with chair alarm set Nurse Communication: Mobility status PT Visit Diagnosis: Unsteadiness on feet (R26.81);Muscle weakness (generalized) (M62.81);Difficulty in walking, not elsewhere classified (R26.2)    Time: 3570-1779 PT Time Calculation (min) (ACUTE ONLY): 29 min   Charges:   PT Evaluation $PT Eval Moderate Complexity: 1 Mod PT Treatments $Therapeutic Activity: 8-22 mins        Lillia Pauls, PT, DPT Acute Rehabilitation Services Office [phone]   Norval Morton 01/02/2022, 11:33 AM

## 2022-01-02 NOTE — Progress Notes (Signed)
   VASCULAR SURGERY ASSESSMENT & PLAN:   PAD WITH GANGRENE LEFT SECOND TOE: The patient was transferred from [facility] hospital.  There is no change on exam.  He has a palpable femoral and popliteal pulse on the left with dry gangrene of the left second toe with some cellulitis.  He is on Rocephin.  His Eliquis is being held.  He is scheduled for arteriography tomorrow.  As per my note yesterday, he is clearly not a candidate for open surgery but I think it is worth proceeding with arteriography to see if he has any options to improve perfusion to hopefully heal a toe amputation.  Otherwise he will require an AKA.  SUBJECTIVE:   No complaints this morning.  PHYSICAL EXAM:   Vitals:   01/01/22 2145 01/01/22 2200 01/01/22 2317 01/02/22 0315  BP:  112/70    Pulse: 76 76    Resp: 20 (!) 21    Temp:  98 F (36.7 C) 98 F (36.7 C)   TempSrc:  Oral Oral   SpO2: 97% 97%    Weight:    80.8 kg  Height:       Palpable left femoral and popliteal pulse. The dry gangrene on the left second toe is unchanged. Cellulitis is about the same.  LABS:   Lab Results  Component Value Date   WBC 8.1 01/02/2022   HGB 10.2 (L) 01/02/2022   HCT 32.0 (L) 01/02/2022   MCV 97.6 01/02/2022   PLT 287 01/02/2022   Lab Results  Component Value Date   CREATININE 1.13 01/02/2022   Lab Results  Component Value Date   INR 1.1 06/19/2020   PROBLEM LIST:    Principal Problem:   Cellulitis Active Problems:   Hyperlipidemia   NEOPLASM, MALIGNANT, BLADDER, HX OF   Hypokalemia   Permanent atrial fibrillation (HCC)   Chronic diastolic CHF (congestive heart failure) (HCC)   Dementia (HCC)   Normocytic anemia   Stage 3a chronic kidney disease (CKD) (HCC)   PVD (peripheral vascular disease) (HCC)   CURRENT MEDS:    enoxaparin (LOVENOX) injection  40 mg Subcutaneous Q24H   feeding supplement  237 mL Oral BID BM   melatonin  3 mg Oral QHS   pantoprazole  40 mg Oral Daily   QUEtiapine  12.5 mg Oral  QHS   sodium chloride flush  3 mL Intravenous Q12H   tamsulosin  0.4 mg Oral QHS    [name] Office: [phone] 01/02/2022

## 2022-01-02 NOTE — Progress Notes (Signed)
Cory Hansen  [id number] DOB: Jan 27, 1921 DOA: 12/31/2021 PCP: Hoyt Koch, MD    Brief Narrative:  88 year old with a history of dementia, permanent atrial fibrillation on Eliquis, CKD stage IIIa, diastolic CHF, and remote history of bladder cancer who presented with a left foot cellulitis which had failed outpatient management.  Venous duplex to evaluate for DVT of the affected limb revealed no DVT but suggested focal stenosis of the left SFA.  He was admitted to the hospital and placed on IV antibiotics.  He was then transferred from [facility] to [facility] to allow for a Vascular Surgery evaluation.  Consultants:  Vascular Surgery  Goals of Care:  Code Status: DNR   DVT prophylaxis: Lovenox  Interim Hx: Afebrile.  Vital signs stable.  Resting comfortably at time of visit.  Denies any new complaints.  Is confused but pleasant.  Assessment & Plan:  Left leg cellulitis  Failed outpatient therapy -continue broad empiric antibiotic therapy   Peripheral vascular disease -dry gangrene of left second toe Status post left great toe amputation 2015 -venous duplex raised question of possible SFA stenosis -for arteriography 9/28  Mild dementia Lives in assisted living facility/memory care unit -continue nightly Seroquel  Chronic diastolic CHF Euvolemic  HLD Not on statin  Permanent atrial fibrillation Off anticoagulation given advanced age and risk of fall  CKD stage IIIa Renal function stable at this time/improved since admission  Chronic normocytic anemia Baseline hemoglobin appears to be 8-10 -hemoglobin stable presently  Hypokalemia Corrected with supplementation  History of bladder cancer  Family Communication: No family present at time of exam Disposition: Awaiting arteriogram   Objective: Blood pressure (!) 115/59, pulse 79, temperature 98.6 F (37 C), temperature source Oral, resp. rate (!) 21, height 5\' 7"  (1.702 m), weight 80.8 kg,  SpO2 99 %. No intake or output data in the 24 hours ending 01/02/22 1004 Filed Weights   12/31/21 1624 01/02/22 0315  Weight: 79.8 kg 80.8 kg    Examination: General: No acute respiratory distress Lungs: Clear to auscultation bilaterally without wheezes or crackles Cardiovascular: Regular rate and rhythm without murmur gallop or rub normal S1 and S2 Abdomen: Nontender, nondistended, soft, bowel sounds positive, no rebound, no ascites, no appreciable mass Extremities: No significant edema bilateral lower extremities  CBC: Recent Labs  Lab 12/31/21 1652 01/01/22 0426 01/02/22 0551  WBC 8.6 6.9 8.1  NEUTROABS 6.6  --   --   HGB 10.3* 8.7* 10.2*  HCT 33.1* 27.6* 32.0*  MCV 99.7 100.0 97.6  PLT 313 275 [id number]   Basic Metabolic Panel: Recent Labs  Lab 12/31/21 1652 01/01/22 0426 01/02/22 0551  NA 138 138 139  K 3.3* 3.9 4.2  CL 100 105 103  CO2 26 25 26   GLUCOSE 139* 88 96  BUN 37* 29* 23  CREATININE 1.53* 1.21 1.13  CALCIUM 9.2 8.4* 9.3  MG  --   --  2.3   GFR: Estimated Creatinine Clearance: 37.2 mL/min (by C-G formula based on SCr of 1.13 mg/dL).   Scheduled Meds:  enoxaparin (LOVENOX) injection  40 mg Subcutaneous Q24H   feeding supplement  237 mL Oral BID BM   melatonin  3 mg Oral QHS   pantoprazole  40 mg Oral Daily   QUEtiapine  12.5 mg Oral QHS   sodium chloride flush  3 mL Intravenous Q12H   tamsulosin  0.4 mg Oral QHS   Continuous Infusions:  cefTRIAXone (ROCEPHIN)  IV Stopped (01/01/22 2146)  LOS: 1 day   Cherene Altes, MD [facility] Office  [phone] Pager - Text Page per Amion  If 7PM-7AM, please contact night-coverage per Amion 01/02/2022, 10:04 AM

## 2022-01-02 NOTE — Consult Note (Signed)
   [facility] Inpatient Consult   01/02/2022  Cory Hansen 1921-02-20 [id number]  [facility] [ACO] Patient: Cory Hansen  Primary Care Provider:  Hoyt Koch, MD, with Pietro Cassis is listed to provide the transition of care follow up   Patient screened for hospitalization with noted to assess for potential [facility] service needs for post hospital transition.  Review of patient's medical record reveals patient is currently being evaluated for post hospital transition to a skilled nursing facility level of care per PT recommendations.  Patient is noted as blind per unit notes in room.  Patient was resting on rounds, no family currently at the bedside. Chart reviewed for any care coordination needs and ongoing medical management noted. Reviewed MD progress notes regarding any surgical interventions, notes reviewed.  Plan:  Continue to follow progress and disposition to assess for post hospital care management needs.    For questions contact:   Natividad Brood, RN BSN [facility] Liaison  [phone] business mobile phone Toll free office [phone]  Fax number: [phone] [email or web address]

## 2022-01-03 ENCOUNTER — Encounter (HOSPITAL_COMMUNITY)
Admission: EM | Disposition: A | Discharge status: Skilled Nursing Facility | Payer: Self-pay | Source: Skilled Nursing Facility | Attending: Internal Medicine

## 2022-01-03 DIAGNOSIS — L03116 Cellulitis of left lower limb: Secondary | ICD-10-CM | POA: Diagnosis not present

## 2022-01-03 DIAGNOSIS — I70262 Atherosclerosis of native arteries of extremities with gangrene, left leg: Secondary | ICD-10-CM

## 2022-01-03 HISTORY — PX: ABDOMINAL AORTOGRAM W/LOWER EXTREMITY: CATH118223

## 2022-01-03 LAB — POCT ACTIVATED CLOTTING TIME: Activated Clotting Time: 215 seconds

## 2022-01-03 SURGERY — ABDOMINAL AORTOGRAM W/LOWER EXTREMITY
Anesthesia: LOCAL

## 2022-01-03 MED ORDER — ASPIRIN 81 MG PO TBEC
81.0000 mg | DELAYED_RELEASE_TABLET | Freq: Every day | ORAL | Status: DC
  Administered 2022-01-04: 81 mg via ORAL
  Filled 2022-01-03: qty 1

## 2022-01-03 MED ORDER — HEPARIN (PORCINE) IN NACL 1000-0.9 UT/500ML-% IV SOLN
INTRAVENOUS | Status: DC | PRN
  Administered 2022-01-03 (×2): 500 mL

## 2022-01-03 MED ORDER — HYDRALAZINE HCL 20 MG/ML IJ SOLN: 5.0000 mg | INTRAMUSCULAR | Status: DC | PRN

## 2022-01-03 MED ORDER — SODIUM CHLORIDE 0.9 % IV SOLN: INTRAVENOUS | Status: DC

## 2022-01-03 MED ORDER — IODIXANOL 320 MG/ML IV SOLN
INTRAVENOUS | Status: DC | PRN
  Administered 2022-01-03: 90 mL

## 2022-01-03 MED ORDER — HEPARIN SODIUM (PORCINE) 1000 UNIT/ML IJ SOLN
INTRAMUSCULAR | Status: DC | PRN
  Administered 2022-01-03: 8000 [IU] via INTRAVENOUS

## 2022-01-03 MED ORDER — CLOPIDOGREL BISULFATE 75 MG PO TABS: 75.0000 mg | ORAL_TABLET | Freq: Every day | ORAL | Status: DC

## 2022-01-03 MED ORDER — CLOPIDOGREL BISULFATE 300 MG PO TABS
ORAL_TABLET | ORAL | Status: DC | PRN
  Administered 2022-01-03: 300 mg via ORAL

## 2022-01-03 MED ORDER — ASPIRIN 81 MG PO CHEW
CHEWABLE_TABLET | ORAL | Status: AC
  Filled 2022-01-03: qty 1

## 2022-01-03 MED ORDER — NITROGLYCERIN 1 MG/10 ML FOR IR/CATH LAB
INTRA_ARTERIAL | Status: AC
  Filled 2022-01-03: qty 10

## 2022-01-03 MED ORDER — LIDOCAINE HCL (PF) 1 % IJ SOLN
INTRAMUSCULAR | Status: AC
  Filled 2022-01-03: qty 30

## 2022-01-03 MED ORDER — NITROGLYCERIN 1 MG/10 ML FOR IR/CATH LAB
INTRA_ARTERIAL | Status: DC | PRN
  Administered 2022-01-03: 200 ug

## 2022-01-03 MED ORDER — SODIUM CHLORIDE 0.9 % IV SOLN: 250.0000 mL | INTRAVENOUS | Status: DC | PRN

## 2022-01-03 MED ORDER — SODIUM CHLORIDE 0.9% FLUSH: 3.0000 mL | Freq: Two times a day (BID) | INTRAVENOUS | Status: DC

## 2022-01-03 MED ORDER — CLOPIDOGREL BISULFATE 75 MG PO TABS
75.0000 mg | ORAL_TABLET | Freq: Every day | ORAL | Status: DC
  Administered 2022-01-04: 75 mg via ORAL
  Filled 2022-01-03: qty 1

## 2022-01-03 MED ORDER — ATORVASTATIN CALCIUM 40 MG PO TABS
40.0000 mg | ORAL_TABLET | Freq: Every day | ORAL | Status: DC
  Administered 2022-01-03 – 2022-01-04 (×2): 40 mg via ORAL
  Filled 2022-01-03 (×2): qty 1

## 2022-01-03 MED ORDER — HEPARIN SODIUM (PORCINE) 1000 UNIT/ML IJ SOLN
INTRAMUSCULAR | Status: AC
  Filled 2022-01-03: qty 10

## 2022-01-03 MED ORDER — ASPIRIN 81 MG PO CHEW
CHEWABLE_TABLET | ORAL | Status: DC | PRN
  Administered 2022-01-03: 81 mg via ORAL

## 2022-01-03 MED ORDER — ACETAMINOPHEN 325 MG PO TABS: 650.0000 mg | ORAL_TABLET | ORAL | Status: DC | PRN

## 2022-01-03 MED ORDER — SODIUM CHLORIDE 0.9% FLUSH: 3.0000 mL | INTRAVENOUS | Status: DC | PRN

## 2022-01-03 MED ORDER — ONDANSETRON HCL 4 MG/2ML IJ SOLN: 4.0000 mg | Freq: Four times a day (QID) | INTRAMUSCULAR | Status: DC | PRN

## 2022-01-03 MED ORDER — CLOPIDOGREL BISULFATE 300 MG PO TABS
ORAL_TABLET | ORAL | Status: AC
  Filled 2022-01-03: qty 1

## 2022-01-03 MED ORDER — LABETALOL HCL 5 MG/ML IV SOLN: 10.0000 mg | INTRAVENOUS | Status: DC | PRN

## 2022-01-03 MED ORDER — HEPARIN (PORCINE) IN NACL 1000-0.9 UT/500ML-% IV SOLN
INTRAVENOUS | Status: AC
  Filled 2022-01-03: qty 1000

## 2022-01-03 MED ORDER — LIDOCAINE HCL (PF) 1 % IJ SOLN
INTRAMUSCULAR | Status: DC | PRN
  Administered 2022-01-03: 15 mL

## 2022-01-03 MED ORDER — CLOPIDOGREL BISULFATE 75 MG PO TABS: 300.0000 mg | ORAL_TABLET | Freq: Once | ORAL | Status: DC

## 2022-01-03 SURGICAL SUPPLY — 25 items
BALLN COYOTE OTW 1.5X40X150 (BALLOONS) ×1
BALLN STERLING OTW 2X220X150 (BALLOONS) ×1
BALLN STERLING OTW 2X40X150 (BALLOONS) ×1
BALLOON COYOTE OTW 1.5X40X150 (BALLOONS) IMPLANT
BALLOON STERLING OTW 2X220X150 (BALLOONS) IMPLANT
BALLOON STERLING OTW 2X40X150 (BALLOONS) IMPLANT
CATH CXI SUPP 2.6F 150 ANG (CATHETERS) IMPLANT
CATH OMNI FLUSH 5F 65CM (CATHETERS) IMPLANT
CATH TEMPO AQUA 5F 100CM (CATHETERS) IMPLANT
DEVICE CLOSURE MYNXGRIP 5F (Vascular Products) IMPLANT
KIT ENCORE 26 ADVANTAGE (KITS) IMPLANT
KIT MICROPUNCTURE NIT STIFF (SHEATH) IMPLANT
KIT PV (KITS) ×1 IMPLANT
SHEATH FLEX ANSEL ANG 5F 45CM (SHEATH) IMPLANT
SHEATH PINNACLE 5F 10CM (SHEATH) IMPLANT
SHEATH PROBE COVER 6X72 (BAG) IMPLANT
SHEATH SHUTTLE 5F/110 (SHEATH) IMPLANT
STOPCOCK MORSE 400PSI 3WAY (MISCELLANEOUS) IMPLANT
SYR MEDRAD MARK V 150ML (SYRINGE) IMPLANT
TRANSDUCER W/STOPCOCK (MISCELLANEOUS) ×1 IMPLANT
TRAY PV CATH (CUSTOM PROCEDURE TRAY) ×1 IMPLANT
WIRE BENTSON .035X145CM (WIRE) IMPLANT
WIRE G V18X300CM (WIRE) IMPLANT
WIRE ROSEN-J .035X180CM (WIRE) IMPLANT
WIRE SPARTACORE .014X300CM (WIRE) IMPLANT

## 2022-01-03 NOTE — Progress Notes (Signed)
Cory Hansen  [id number] DOB: Apr 22, 1920 DOA: 12/31/2021 PCP: Hoyt Koch, MD    Brief Narrative:  88 year old with a history of dementia, permanent atrial fibrillation on Eliquis, CKD stage IIIa, diastolic CHF, and remote history of bladder cancer who presented with a left foot cellulitis which had failed outpatient management.  Venous duplex to evaluate for DVT of the affected limb revealed no DVT but suggested focal stenosis of the left SFA.  He was admitted to the hospital and placed on IV antibiotics.  He was then transferred from [facility] to [facility] to allow for a Vascular Surgery evaluation.  Consultants:  Vascular Surgery  Goals of Care:  Code Status: DNR   DVT prophylaxis: Lovenox  Interim Hx: Afebrile.  Vital signs stable.  Underwent successful angioplasty in the vascular lab today.  Alert conversant and pleasant.  Denies pain.  Appears comfortable.  Assessment & Plan:  Left leg cellulitis  Failed outpatient therapy -continue broad empiric antibiotic therapy -dramatically improved on physical exam  Peripheral vascular disease -dry gangrene of left second toe Status post left great toe amputation 2015 -venous duplex raised question of possible SFA stenosis -underwent successful left posterior tibial artery angioplasty 9/28 per vascular surgery  Mild dementia Lives in assisted living facility/memory care unit -continue nightly Seroquel  Chronic diastolic CHF Euvolemic  HLD Not on statin  Permanent atrial fibrillation Off anticoagulation given advanced age and risk of fall  CKD stage IIIa Renal function stable at this time/improved since admission  Chronic normocytic anemia Baseline hemoglobin appears to be 8-10 -hemoglobin stable presently  Hypokalemia Corrected with supplementation  History of bladder cancer  Family Communication: Spoke with daughter at bedside Disposition: Will likely be stable to return to his memory care  unit within the next 24-48 hours   Objective: Blood pressure 101/60, pulse 60, temperature 97.7 F (36.5 C), temperature source Oral, resp. rate 18, height 5\' 7"  (1.702 m), weight 79.3 kg, SpO2 100 %.  Intake/Output Summary (Last 24 hours) at 01/03/2022 1043 Last data filed at 01/03/2022 0300 Gross per 24 hour  Intake 200 ml  Output --  Net 200 ml   Filed Weights   12/31/21 1624 01/02/22 0315 01/03/22 0417  Weight: 79.8 kg 80.8 kg 79.3 kg    Examination: General: No acute respiratory distress Lungs: Clear to auscultation bilaterally without wheezes or crackles Cardiovascular: Regular rate and rhythm without murmur gallop or rub normal S1 and S2 Abdomen: Nontender, nondistended, soft, bowel sounds positive, no rebound, no ascites, no appreciable mass Extremities: No significant edema bilateral lower extremities -erythema of the left foot/ankle has resolved  CBC: Recent Labs  Lab 12/31/21 1652 01/01/22 0426 01/02/22 0551  WBC 8.6 6.9 8.1  NEUTROABS 6.6  --   --   HGB 10.3* 8.7* 10.2*  HCT 33.1* 27.6* 32.0*  MCV 99.7 100.0 97.6  PLT 313 275 [id number]   Basic Metabolic Panel: Recent Labs  Lab 12/31/21 1652 01/01/22 0426 01/02/22 0551  NA 138 138 139  K 3.3* 3.9 4.2  CL 100 105 103  CO2 26 25 26   GLUCOSE 139* 88 96  BUN 37* 29* 23  CREATININE 1.53* 1.21 1.13  CALCIUM 9.2 8.4* 9.3  MG  --   --  2.3   GFR: Estimated Creatinine Clearance: 34.1 mL/min (by C-G formula based on SCr of 1.13 mg/dL).   Scheduled Meds:  [START ON 01/04/2022] aspirin EC  81 mg Oral Daily   atorvastatin  40 mg Oral Daily   [  START ON 01/04/2022] clopidogrel  75 mg Oral Daily   enoxaparin (LOVENOX) injection  40 mg Subcutaneous Q24H   feeding supplement  237 mL Oral BID BM   melatonin  3 mg Oral QHS   pantoprazole  40 mg Oral Daily   QUEtiapine  12.5 mg Oral QHS   sodium chloride flush  3 mL Intravenous Q12H   sodium chloride flush  3 mL Intravenous Q12H   tamsulosin  0.4 mg Oral QHS    Continuous Infusions:  sodium chloride 100 mL/hr at 01/03/22 0425   sodium chloride     sodium chloride     cefTRIAXone (ROCEPHIN)  IV Stopped (01/02/22 1915)     LOS: 2 days   Cherene Altes, MD [facility] Office  [phone] Pager - Text Page per Shea Evans  If 7PM-7AM, please contact night-coverage per Amion 01/03/2022, 10:43 AM

## 2022-01-03 NOTE — Progress Notes (Signed)
Patient arrived to 4e-04, vitals taken, denies pain.

## 2022-01-03 NOTE — Op Note (Signed)
Patient name: Cory Hansen MRN: [id number] DOB: Jul 19, 1920 Sex: male  01/03/2022 Pre-operative Diagnosis: Critical limb ischemia of the left lower extremity with tissue loss Post-operative diagnosis:  Same Surgeon:  Marty Heck, MD Procedure Performed: 1.  Ultrasound-guided access right common femoral artery 2.  Aortogram with catheter selection of aorta 3.  Left lower extremity arteriogram with selection of third order branches 4.  Left posterior tibial artery angioplasty (2 mm x 200 mm Sterling) 5.  Mynx closure of the right common femoral artery  Indications: Patient is a 88 year old male seen in consultation by Dr. Scot Dock with dry gangrene of the left second toe.  He had noncompressible ABIs that were monophasic with an inadequate toe pressure for wound healing.  He presents today for lower extremity arteriogram and possible intervention with a focus on the left leg after risks benefits discussed.  Findings:   Aortogram showed no flow-limiting stenosis in the aortoiliac segment.  The left common femoral and profunda and SFA and above and below-knee popliteal artery are all patent without flow-limiting stenosis.  He has significant tibial disease.  The anterior tibial occludes shortly after takeoff and does not reconstitute.  The peroneal appears to occlude in the distal calf with multiple chronic total occlusions.  Posterior tibial is diffusely diseased and calcified and small with multiple high-grade stenosis.    Ultimately the left posterior tibial artery was crossed antegrade and I got my wire into the plantar arch of the foot.  The entire posterior tibial was then angioplastied with a 2 mm x 200 mm Sterling to nominal pressure for 2 minutes.  We did have a perforation due to the small size of the artery near the ankle and this was treated with a prolonged inflation with the 2 mm Sterling at low pressure and this resolved.  Excellent results and now has inline flow down the  PT with filling of the plantar arch.  Brisk multi-phasic PT signal at completion.   Procedure:  The patient was identified in the holding area and taken to room 8.  The patient was then placed supine on the table and prepped and draped in the usual sterile fashion.  A time out was called.  Ultrasound was used to evaluate the right common femoral artery.  It was patent .  A digital ultrasound image was acquired.  A micropuncture needle was used to access the right common femoral artery under ultrasound guidance.  An 018 wire was advanced without resistance and a micropuncture sheath was placed.  The 018 wire was removed and a benson wire was placed.  The micropuncture sheath was exchanged for a 5 french sheath.  An omniflush catheter was advanced over the wire to the level of L-1.  An abdominal angiogram was obtained.  Next, using the omniflush catheter and a benson wire, the aortic bifurcation was crossed and the catheter was placed into theleft external iliac artery and left runoff was obtained.  After evaluating images elected for left leg intervention on his tibial disease.  A long Rosen wire was placed down the left SFA and exchanged for a long Ansell sheath in the right groin over the aortic bifurcation.  We then gave the patient 100 units/kg IV heparin.  I used a V18 wire with a CXI catheter and got down the posterior tibial all the way into the plantar arch.  I then angioplastied the entire PT from the ankle all the way to the takeoff off the tibial trifurcation to 2  mm at nominal pressure for 2 minutes.  We did have a perforation down by the ankle where the artery was quite small.  I tried to exchanged for an 014 system and use a 1.5 mm balloon but did not have enough support to get the balloon to track antegrade.  I then upsized to a longer 5 French sheath in the right groin over the aortic bifurcation into the popliteal artery and I went back down with a V18 and a 2 mm x 40 mm Sterling that was inflated  at a very low pressure for several minutes with resolution of the perforation.  We had preserved antegrade flow with no obvious dissection in the posterior tibial artery distally.  Nitroglycerin was given.  Ultimately wires and catheters were removed.  A short 5 French sheath was placed in the right groin.  Mynx closure device was deployed.  Plan: Patient is now optimized from vascular surgery standpoint.  Will need dual antiplatelet therapy plus statin.  Marty Heck, MD [facility] Office: [phone]

## 2022-01-03 NOTE — Progress Notes (Signed)
Transport on unit to take pt for angiogram. VSS, o2 x 2L, NS infusing at 169ml/hr. No signs of distress, pt calm and cooperative

## 2022-01-03 NOTE — Progress Notes (Signed)
[facility]  Subjective  -no complaints.   Objective 122/76 84 98.7 F (37.1 C) (Oral) 18 100%  Intake/Output Summary (Last 24 hours) at 01/03/2022 0801 Last data filed at 01/03/2022 0300 Gross per 24 hour  Intake 200 ml  Output --  Net 200 ml    Palpable left femoral and popliteal pulse. The dry gangrene on the left second toe is unchanged.  Laboratory Lab Results: Recent Labs    01/01/22 0426 01/02/22 0551  WBC 6.9 8.1  HGB 8.7* 10.2*  HCT 27.6* 32.0*  PLT 275 287   BMET Recent Labs    01/01/22 0426 01/02/22 0551  NA 138 139  K 3.9 4.2  CL 105 103  CO2 25 26  GLUCOSE 88 96  BUN 29* 23  CREATININE 1.21 1.13  CALCIUM 8.4* 9.3    COAG Lab Results  Component Value Date   INR 1.1 06/19/2020   No results found for: "PTT"  Assessment/Planning:  Plan aortogram with lower extremity arteriogram and possible intervention with a focus on the left leg.  He has dry gangrene of the left second toe.  Risk benefits discussed.  Marty Heck 01/03/2022 8:01 AM --

## 2022-01-04 ENCOUNTER — Other Ambulatory Visit (HOSPITAL_COMMUNITY): Payer: Self-pay

## 2022-01-04 ENCOUNTER — Encounter (HOSPITAL_COMMUNITY): Payer: Self-pay | Admitting: Vascular Surgery

## 2022-01-04 DIAGNOSIS — I4821 Permanent atrial fibrillation: Secondary | ICD-10-CM

## 2022-01-04 DIAGNOSIS — E7800 Pure hypercholesterolemia, unspecified: Secondary | ICD-10-CM

## 2022-01-04 DIAGNOSIS — F03B0 Unspecified dementia, moderate, without behavioral disturbance, psychotic disturbance, mood disturbance, and anxiety: Secondary | ICD-10-CM

## 2022-01-04 DIAGNOSIS — L03116 Cellulitis of left lower limb: Secondary | ICD-10-CM | POA: Diagnosis not present

## 2022-01-04 LAB — BASIC METABOLIC PANEL
Anion gap: 7 (ref 5–15)
BUN: 17 mg/dL (ref 8–23)
CO2: 24 mmol/L (ref 22–32)
Calcium: 8.3 mg/dL — ABNORMAL LOW (ref 8.9–10.3)
Chloride: 104 mmol/L (ref 98–111)
Creatinine, Ser: 1.05 mg/dL (ref 0.61–1.24)
GFR, Estimated: >60 mL/min (ref >60)
Glucose, Bld: 105 mg/dL — ABNORMAL HIGH (ref 70–99)
Potassium: 4 mmol/L (ref 3.5–5.1)
Sodium: 135 mmol/L (ref 135–145)

## 2022-01-04 LAB — LIPID PANEL
Cholesterol: 122 mg/dL (ref 0–200)
HDL: 41 mg/dL (ref >40)
LDL Cholesterol: 76 mg/dL (ref 0–99)
Total CHOL/HDL Ratio: 3 RATIO
Triglycerides: 27 mg/dL (ref <150)
VLDL: 5 mg/dL (ref 0–40)

## 2022-01-04 LAB — CBC
HCT: 26.5 % — ABNORMAL LOW (ref 39.0–52.0)
Hemoglobin: 8.3 g/dL — ABNORMAL LOW (ref 13.0–17.0)
MCH: 30.4 pg (ref 26.0–34.0)
MCHC: 31.3 g/dL (ref 30.0–36.0)
MCV: 97.1 fL (ref 80.0–100.0)
Platelets: 266 10*3/uL (ref 150–400)
RBC: 2.73 MIL/uL — ABNORMAL LOW (ref 4.22–5.81)
RDW: 14.5 % (ref 11.5–15.5)
WBC: 8.6 10*3/uL (ref 4.0–10.5)
nRBC: 0 % (ref 0.0–0.2)

## 2022-01-04 MED ORDER — ASPIRIN 81 MG PO TBEC
81.0000 mg | DELAYED_RELEASE_TABLET | Freq: Every day | ORAL | 12 refills | Status: AC
  Filled 2022-01-04: qty 30, 30d supply, fill #0

## 2022-01-04 MED ORDER — CLOPIDOGREL BISULFATE 75 MG PO TABS
75.0000 mg | ORAL_TABLET | Freq: Every day | ORAL | 2 refills | Status: DC
  Filled 2022-01-04: qty 30, 30d supply, fill #0

## 2022-01-04 MED ORDER — CEPHALEXIN 500 MG PO CAPS: 500.0000 mg | ORAL_CAPSULE | Freq: Four times a day (QID) | ORAL | Status: DC

## 2022-01-04 MED ORDER — CEPHALEXIN 500 MG PO CAPS
500.0000 mg | ORAL_CAPSULE | Freq: Four times a day (QID) | ORAL | Status: DC
  Administered 2022-01-04: 500 mg via ORAL
  Filled 2022-01-04: qty 1

## 2022-01-04 MED ORDER — CEPHALEXIN 500 MG PO CAPS
500.0000 mg | ORAL_CAPSULE | Freq: Four times a day (QID) | ORAL | 0 refills | Status: AC
  Filled 2022-01-04: qty 24, 6d supply, fill #0

## 2022-01-04 MED ORDER — ATORVASTATIN CALCIUM 40 MG PO TABS
40.0000 mg | ORAL_TABLET | Freq: Every day | ORAL | 2 refills | Status: DC
  Filled 2022-01-04: qty 30, 30d supply, fill #0

## 2022-01-04 NOTE — Care Management Important Message (Signed)
Important Message  Patient Details  Name: Cory Hansen MRN: [id number] Date of Birth: 08-31-1920   Medicare Important Message Given:  Yes     [name] Montine Circle 01/04/2022, 3:38 PM

## 2022-01-04 NOTE — NC FL2 (Signed)
[place] MEDICAID FL2 LEVEL OF CARE SCREENING TOOL     IDENTIFICATION  Patient Name: Cory Hansen Birthdate: [birth year] Sex: male Admission Date (Current Location): 12/31/2021  [facility] and [place] Number:  Producer, television/film/video and Address:  [facility] [place], [address] [place], [place] [address]      Provider Number: [id number]  Attending Physician Name and Address:  Lonia Blood, MD  Relative Name and Phone Number:       Current Level of Care: Hospital Recommended Level of Care: Assisted Living Facility, Memory Care Prior Approval Number:    Date Approved/Denied:   PASRR Number:    Discharge Plan: Other (Comment) (ALF Memory Care)    Current Diagnoses: Patient Active Problem List   Diagnosis Date Noted   PVD (peripheral vascular disease) (HCC) 01/01/2022   Cellulitis 12/31/2021   Constipation 06/02/2021   Normocytic anemia 05/31/2021   Stage 3a chronic kidney disease (CKD) (HCC) 05/31/2021   Dementia (HCC) 10/21/2020   Chronic diastolic CHF (congestive heart failure) (HCC) 06/20/2020   Permanent atrial fibrillation (HCC) 06/19/2020   Hypokalemia 03/14/2020   Urinary dribbling 07/26/2015   CT, CHEST, ABNORMAL 01/23/2009   Chronic bilateral low back pain with right-sided sciatica 01/17/2009   NEOPLASM, MALIGNANT, BLADDER, HX OF 01/17/2009   Hx of benign neoplasm of prostate 01/17/2009   Hyperlipidemia 01/16/2009   ARTHRITIS 01/16/2009    Orientation RESPIRATION BLADDER Height & Weight     Self  Normal External catheter, Continent Weight: 174 lb 6.1 oz (79.1 kg) Height:  5\' 7"  (170.2 cm)  BEHAVIORAL SYMPTOMS/MOOD NEUROLOGICAL BOWEL NUTRITION STATUS      Continent Diet (regular diet)  AMBULATORY STATUS COMMUNICATION OF NEEDS Skin     Verbally  (wound/incision Toe, Left Anterior)                       Personal Care Assistance Level of Assistance  Bathing, Feeding, Dressing Bathing Assistance: Limited  assistance Feeding assistance: Limited assistance Dressing Assistance: Limited assistance     Functional Limitations Info  Sight, Hearing, Speech Sight Info: Impaired Hearing Info: Impaired Speech Info: Adequate    SPECIAL CARE FACTORS FREQUENCY  PT (By licensed PT), OT (By licensed OT)                    Contractures Contractures Info: Not present    Additional Factors Info  Code Status, Allergies Code Status Info: DNR Allergies Info: NKA           Current Medications (01/04/2022):  This is the current hospital active medication list Current Facility-Administered Medications  Medication Dose Route Frequency Provider Last Rate Last Admin   acetaminophen (TYLENOL) tablet 650 mg  650 mg Oral Q4H PRN Cephus Shelling, MD       aspirin EC tablet 81 mg  81 mg Oral Daily Cephus Shelling, MD   81 mg at 01/04/22 0943   atorvastatin (LIPITOR) tablet 40 mg  40 mg Oral Daily Cephus Shelling, MD   40 mg at 01/04/22 0944   cephALEXin (KEFLEX) capsule 500 mg  500 mg Oral Q6H Lonia Blood, MD   500 mg at 01/04/22 1222   clopidogrel (PLAVIX) tablet 75 mg  75 mg Oral Daily Cephus Shelling, MD   75 mg at 01/04/22 0944   enoxaparin (LOVENOX) injection 40 mg  40 mg Subcutaneous Q24H Cephus Shelling, MD   40 mg at 01/03/22 2126  feeding supplement (ENSURE ENLIVE / ENSURE PLUS) liquid 237 mL  237 mL Oral BID BM Marty Heck, MD   237 mL at 01/04/22 1455   melatonin tablet 3 mg  3 mg Oral QHS Marty Heck, MD   3 mg at 01/03/22 2125   ondansetron (ZOFRAN) injection 4 mg  4 mg Intravenous Q6H PRN Marty Heck, MD       Oral care mouth rinse  15 mL Mouth Rinse PRN Marty Heck, MD       pantoprazole (PROTONIX) EC tablet 40 mg  40 mg Oral Daily Marty Heck, MD   40 mg at 01/04/22 0936   polyethylene glycol (MIRALAX / GLYCOLAX) packet 17 g  17 g Oral Daily PRN Marty Heck, MD       QUEtiapine (SEROQUEL) tablet 12.5  mg  12.5 mg Oral QHS Marty Heck, MD   12.5 mg at 01/03/22 2125   sodium chloride flush (NS) 0.9 % injection 3 mL  3 mL Intravenous PRN Marty Heck, MD       tamsulosin [facility]) capsule 0.4 mg  0.4 mg Oral QHS Marty Heck, MD   0.4 mg at 01/03/22 2125     Discharge Medications: Please see discharge summary for a list of discharge medications.  Relevant Imaging Results:  Relevant Lab Results:   Additional Information SSN # [id number]  Vinie Sill, LCSW

## 2022-01-04 NOTE — Progress Notes (Signed)
Patient report given to [place] from [facility] questions were answered.

## 2022-01-04 NOTE — Progress Notes (Addendum)
  Progress Note   VASCULAR SURGERY ASSESSMENT & PLAN:   PERIPHERAL ARTERIAL DISEASE WITH GANGRENE LEFT SECOND TOE: He has undergone successful angioplasty of the left posterior tibial artery and his circulation is optimized.  Would continue aggressive wound care and antibiotics.  He will likely ultimately require a toe amputation.  I am not sure if orthopedics or podiatry have previously been involved with this patient.  If not I could address the toe early next week potentially.  Gae Gallop, MD 10:11 AM   01/04/2022 7:42 AM 1 Day Post-Op  Subjective:  no complaints  Vitals:   01/03/22 2309 01/04/22 0326  BP: 103/60 106/63  Pulse: 93 81  Resp: 16 17  Temp: 98.8 F (37.1 C) 97.7 F (36.5 C)  SpO2: 100% 100%   Physical Exam: Lungs:  non labored Incisions:  R groin c/d/I without hematoma Extremities:  L PT brisk by doppler Neurologic: A&O  CBC    Component Value Date/Time   WBC 8.6 01/04/2022 0106   RBC 2.73 (L) 01/04/2022 0106   HGB 8.3 (L) 01/04/2022 0106   HCT 26.5 (L) 01/04/2022 0106   PLT 266 01/04/2022 0106   MCV 97.1 01/04/2022 0106   MCH 30.4 01/04/2022 0106   MCHC 31.3 01/04/2022 0106   RDW 14.5 01/04/2022 0106   LYMPHSABS 1.0 12/31/2021 1652   MONOABS 0.7 12/31/2021 1652   EOSABS 0.2 12/31/2021 1652   BASOSABS 0.0 12/31/2021 1652    BMET    Component Value Date/Time   NA 135 01/04/2022 0106   K 4.0 01/04/2022 0106   CL 104 01/04/2022 0106   CO2 24 01/04/2022 0106   GLUCOSE 105 (H) 01/04/2022 0106   BUN 17 01/04/2022 0106   CREATININE 1.05 01/04/2022 0106   CREATININE 1.04 03/17/2014 1343   CALCIUM 8.3 (L) 01/04/2022 0106   GFRNONAA >60 01/04/2022 0106   GFRNONAA 62 03/17/2014 1343   GFRAA 72 03/17/2014 1343    INR    Component Value Date/Time   INR 1.1 06/19/2020 1715    Intake/Output Summary (Last 24 hours) at 01/04/2022 0742 Last data filed at 01/04/2022 0400 Gross per 24 hour  Intake 1539.59 ml  Output 400 ml  Net 1139.59 ml    Assessment/Plan:  88 y.o. male is s/p L PT angioplasty 1 Day Post-Op   R groin cath site without hematoma L foot warm with brisk flow in PTA by doppler Optimized from a vascular surgery standpoint Call vascular with questions over the weekend  Dagoberto Ligas, PA-C [facility] [phone] 01/04/2022 7:42 AM  I have interviewed the patient and examined the patient. I agree with the PA.   Gae Gallop, MD

## 2022-01-04 NOTE — TOC Transition Note (Addendum)
Transition of Care [facility]) - CM/SW Discharge Note   Patient Details  Name: Cory Hansen MRN: [id number] Date of Birth: 20-May-1920  Transition of Care [facility]) CM/SW Contact:  Vinie Sill, LCSW Phone Number: 01/04/2022, 2:53 PM   Clinical Narrative:     CSW spoke with [facility]- informed of d/c instructions for wound care noted on d/c summary- Wound Care: Keep second toe and L foot covered in dry kerlix for protection - change daily - monitor closely for evidence of recurrent expanding soft tissue infection. They informed, they would be able to manage care along w/Hospice. ALF was agreeable to patient returning.   Patient will Discharge to: [facility] Discharge Date: 01/04/2022 Family Notified: daughter Transport By: Family  Per MD patient is ready for discharge. RN, patient, and facility notified of discharge. Discharge Summary sent to facility. RN given number for [phone] or [phone].   Clinical Social Worker signing off.  Thurmond Butts, MSW, LCSW Clinical Social Worker     Final next level of care: Assisted Living Barriers to Discharge: Barriers Resolved   Patient Goals and CMS Choice        Discharge Placement              Patient chooses bed at:  [facility]) Patient to be transferred to facility by: [place] Name of family member notified: daughter Patient and family notified of of transfer: 01/04/22  Discharge Plan and Services In-house Referral: Clinical Social Work                                   Social Determinants of Health (SDOH) Interventions     Readmission Risk Interventions     No data to display

## 2022-01-04 NOTE — Discharge Summary (Signed)
DISCHARGE SUMMARY  MAVEN VARELAS  MR#: [id number]  DOB:1920/09/25  Date of Admission: 12/31/2021 Date of Discharge: 01/04/2022  Attending Physician:Chadwick Reiswig Hennie Duos, MD  Patient's VFI:EPPIRJJO, Real Cons, MD  Consults: Vascular Surgery   Disposition: D/C to memory care unit  Follow-up Appts:  Follow-up Information     Angelia Mould, MD. Schedule an appointment as soon as possible for a visit in 5 day(s).   Specialties: Vascular Surgery, Cardiology Why: Please call the office to arrange a follow up appointment with Dr. Scot Dock to check the gangrene on your second toe of the left foot. Contact information: [place] [address] [phone]         Hoyt Koch, MD Follow up in 1 week(s).   Specialty: Internal Medicine Contact information: [place] [address] [phone]                 Discharge Diagnoses: Left leg cellulitis  Peripheral vascular disease -dry gangrene of left second toe Mild dementia Chronic diastolic CHF HLD Permanent atrial fibrillation CKD stage IIIa Chronic normocytic anemia Hypokalemia History of bladder cancer  Wound Care: Keep second toe and L foot covered in dry kerlix for protection - change daily - monitor closely for evidence of recurrent expanding soft tissue infection   Initial presentation: 88 year old with a history of dementia, permanent atrial fibrillation on Eliquis, CKD stage IIIa, diastolic CHF, and remote history of bladder cancer who presented with a left foot cellulitis which had failed outpatient management.  Venous duplex to evaluate for DVT of the affected limb revealed no DVT but suggested focal stenosis of the left SFA.  He was admitted to the hospital and placed on IV antibiotics.  He was then transferred from [facility] to [facility] to allow for a Vascular Surgery evaluation.  Hospital Course:  Left leg cellulitis  Failed outpatient  therapy - rapidly improved with IV antibiotics and with improved blood flow to to the lower extremity post angioplasty - transitioned to oral antibiotics for prolonged course given ongoing dry gangrene   Peripheral vascular disease -dry gangrene of left second toe Status post left great toe amputation 2015 - venous duplex raised question of possible SFA stenosis - underwent successful left posterior tibial artery angioplasty 9/28 per Vascular Surgery - will need continued close monitoring of established gangrenous area but hopefully improved blood flow will prevent further progression and episodes of cellulitis - to see Dr. Scot Dock in clinic for f/u for planning of probable eventual amputation of said digit   Mild dementia Lives in assisted living facility/memory care unit -continue nightly Seroquel - it behooves [place] to transition him back to his familiar surroundings as soon as possible   Chronic diastolic CHF Euvolemic on exam at time of d/c - no acute exacerbation during this admission    HLD Not on statin - LDL this admission 76   Permanent atrial fibrillation Off anticoagulation given advanced age and risk of fall - family confirmed patient was not taking Eliquis at his facility prior to this admission   CKD stage IIIa Renal function stable at this time/improved since admission   Chronic normocytic anemia Baseline hemoglobin appears to be 8-10 -hemoglobin stable during this admission    Hypokalemia Corrected with supplementation   History of bladder cancer  Allergies as of 01/04/2022   No Known Allergies      Medication List     STOP taking these medications    Eliquis 5  MG Tabs tablet Generic drug: apixaban   ferrous sulfate 325 (65 FE) MG tablet   furosemide 40 MG tablet Commonly known as: LASIX   GLUCOSAMINE-CHONDROITIN PLUS PO       TAKE these medications    acetaminophen 325 MG tablet Commonly known as: TYLENOL Take 650 mg by mouth in the morning and at  bedtime.   aspirin EC 81 MG tablet Take 1 tablet (81 mg total) by mouth daily. Swallow whole. Start taking on: January 05, 2022   atorvastatin 40 MG tablet Commonly known as: LIPITOR Take 1 tablet (40 mg total) by mouth daily. Start taking on: January 05, 2022   cephALEXin 500 MG capsule Commonly known as: KEFLEX Take 1 capsule (500 mg total) by mouth every 6 (six) hours for 6 days.   clopidogrel 75 MG tablet Commonly known as: PLAVIX Take 1 tablet (75 mg total) by mouth daily. Start taking on: January 05, 2022   feeding supplement Liqd Take 237 mLs by mouth 2 (two) times daily between meals. What changed: additional instructions   LORazepam 0.5 MG tablet Commonly known as: ATIVAN Take 0.5 mg by mouth every 4 (four) hours as needed for anxiety.   melatonin 3 MG Tabs tablet Take 1 tablet (3 mg total) by mouth at bedtime.   pantoprazole 40 MG tablet Commonly known as: PROTONIX TAKE 1 TABLET BY MOUTH  DAILY   polyethylene glycol 17 g packet Commonly known as: MIRALAX / GLYCOLAX Take 17 g by mouth daily.   QUEtiapine 25 MG tablet Commonly known as: SEROQUEL Take 0.5 tablets (12.5 mg total) by mouth at bedtime. What changed: how much to take   senna-docusate 8.6-50 MG tablet Commonly known as: Senokot-S Take 1 tablet by mouth at bedtime as needed for mild constipation. What changed: when to take this   tamsulosin 0.4 MG Caps capsule Commonly known as: FLOMAX Take 0.4 mg by mouth at bedtime.       Day of Discharge BP 112/68 (BP Location: Right Arm)   Pulse 86   Temp 98.4 F (36.9 C) (Oral)   Resp 18   Ht 5\' 7"  (1.702 m)   Wt 79.1 kg   SpO2 100%   BMI 27.31 kg/m   Physical Exam: General: No acute respiratory distress Lungs: Clear to auscultation bilaterally without wheezes or crackles Cardiovascular: Regular rate and rhythm without murmur  Abdomen: Nontender, nondistended, soft, bowel sounds positive Extremities: L foot 2nd toe dry gangrene  stable - no surrounding erythema - cellulitis of foot and ankle resolved   Basic Metabolic Panel: Recent Labs  Lab 12/31/21 1652 01/01/22 0426 01/02/22 0551 01/04/22 0106  NA 138 138 139 135  K 3.3* 3.9 4.2 4.0  CL 100 105 103 104  CO2 26 25 26 24   GLUCOSE 139* 88 96 105*  BUN 37* 29* 23 17  CREATININE 1.53* 1.21 1.13 1.05  CALCIUM 9.2 8.4* 9.3 8.3*  MG  --   --  2.3  --    CBC: Recent Labs  Lab 12/31/21 1652 01/01/22 0426 01/02/22 0551 01/04/22 0106  WBC 8.6 6.9 8.1 8.6  NEUTROABS 6.6  --   --   --   HGB 10.3* 8.7* 10.2* 8.3*  HCT 33.1* 27.6* 32.0* 26.5*  MCV 99.7 100.0 97.6 97.1  PLT 313 275 287 266    Recent Results (from the past 240 hour(s))  Blood Cultures x 2 sites     Status: None (Preliminary result)   Collection Time: 12/31/21  4:52 PM  Specimen: BLOOD  Result Value Ref Range Status   Specimen Description   Final    BLOOD RIGHT ANTECUBITAL Performed at [facility] [address], [place], [place] [address]    Special Requests   Final    BOTTLES DRAWN AEROBIC AND ANAEROBIC Blood Culture adequate volume Performed at [facility] [address], [place], [place] [address]    Culture   Final    NO GROWTH 4 DAYS Performed at [facility], [place] [address], [place], [place] [address]    Report Status PENDING  Incomplete  Blood Cultures x 2 sites     Status: None (Preliminary result)   Collection Time: 12/31/21  6:41 PM   Specimen: BLOOD  Result Value Ref Range Status   Specimen Description   Final    BLOOD BLOOD RIGHT HAND Performed at [facility] [address], [place], [place] [address]    Special Requests   Final    BOTTLES DRAWN AEROBIC AND ANAEROBIC Blood Culture results may not be optimal due to an inadequate volume of blood received in culture bottles Performed at [facility] [address] [place], [place] [address]    Culture   Final    NO GROWTH 4  DAYS Performed at [facility], [place] [address] [place], [place] [address]    Report Status PENDING  Incomplete     Time spent in discharge (includes decision making & examination of pt): 35 minutes  01/04/2022, 11:11 AM   Cherene Altes, MD [facility] Office  [phone]

## 2022-01-04 NOTE — Progress Notes (Signed)
PHARMACIST LIPID MONITORING   Cory Hansen is a 88 y.o. male admitted on 12/31/2021 with critical limb ischemia.  Pharmacy has been consulted to optimize lipid-lowering therapy with the indication of secondary prevention for clinical ASCVD.  Recent Labs:  Lipid Panel (last 6 months):   Lab Results  Component Value Date   CHOL 122 01/04/2022   TRIG 27 01/04/2022   HDL 41 01/04/2022   CHOLHDL 3.0 01/04/2022   VLDL 5 01/04/2022   LDLCALC 76 01/04/2022    Hepatic function panel (last 6 months):   Lab Results  Component Value Date   AST 30 01/01/2022   ALT 21 01/01/2022   ALKPHOS 96 01/01/2022   BILITOT 0.4 01/01/2022    SCr (since admission):   Serum creatinine: 1.05 mg/dL 01/04/22 0106 Estimated creatinine clearance: 36.7 mL/min  Current therapy and lipid therapy tolerance Current lipid-lowering therapy: atorvastatin 40 mg  Previous lipid-lowering therapies (if applicable): N/A Documented or reported allergies or intolerances to lipid-lowering therapies (if applicable): none  Assessment:   Patient agrees with changes to lipid-lowering therapy  Plan:    1.Statin intensity (high intensity recommended for all patients regardless of the LDL):  No statin changes. The patient is newly started on a high intensity statin this admission.  2.Add ezetimibe (if any one of the following):   Not indicated at this time.  3.Refer to lipid clinic:   No  4.Follow-up with:  Primary care provider - Hoyt Koch, MD  5.Follow-up labs after discharge:  Changes in lipid therapy were made. Check a lipid panel in 8-12 weeks then annually.     Antonietta Jewel, PharmD, [place] Clinical Pharmacist  Phone: [phone] 01/04/2022 7:44 AM  Please check AMION for all [place] phone numbers After 10:00 PM, call [place] [phone]

## 2022-01-05 LAB — CULTURE, BLOOD (ROUTINE X 2)
Culture: NO GROWTH
Culture: NO GROWTH
Special Requests: ADEQUATE

## 2022-01-08 ENCOUNTER — Telehealth: Payer: Self-pay

## 2022-01-08 NOTE — Telephone Encounter (Signed)
Pt's daughter, [name], called stating that the pt was having intermittent pain and wanted to know if there was anything more to do than just Tylenol.  Reviewed pt's chart, returned call for clarification, two identifiers used. Pt has dementia, but does not usually complain and he has c/o leg pain at times. She states he does not have any swelling or discoloration. She states that he does not c/o of foot pain. Informed her that some discomfort can be expected, but would ask the PA for further recommendations. Spoke with [place], [place] who advised taking the Tylenol q6 and did not want to risk safety by prescribing any narcotics. Confirmed understanding.

## 2022-01-10 ENCOUNTER — Ambulatory Visit (INDEPENDENT_AMBULATORY_CARE_PROVIDER_SITE_OTHER): Payer: Medicare Other | Admitting: Physician Assistant

## 2022-01-10 ENCOUNTER — Encounter: Payer: Self-pay | Admitting: *Deleted

## 2022-01-10 ENCOUNTER — Other Ambulatory Visit (HOSPITAL_COMMUNITY): Payer: Self-pay

## 2022-01-10 ENCOUNTER — Other Ambulatory Visit: Payer: Self-pay | Admitting: *Deleted

## 2022-01-10 VITALS — BP 100/57 | HR 96 | Temp 97.6°F

## 2022-01-10 DIAGNOSIS — I96 Gangrene, not elsewhere classified: Secondary | ICD-10-CM

## 2022-01-10 DIAGNOSIS — I739 Peripheral vascular disease, unspecified: Secondary | ICD-10-CM

## 2022-01-10 NOTE — Addendum Note (Signed)
Addended by: Nicholas Lose on: 01/10/2022 03:32 PM   Modules accepted: Orders

## 2022-01-10 NOTE — H&P (View-Only) (Signed)
POST OPERATIVE OFFICE NOTE    CC:  F/u for surgery  HPI:  This is a 88 y.o. male who has recent history of non healing left second toe ulcer due to  infrainguinal arterial occlusive disease s/p Left posterior tibial artery angioplasty on 01/03/22 by Dr. Carlis Abbott.  This was performed to optimize inflow to either heal the toe and or provide inflow to heal an amputation of the toe.  He is here today with his 2 daughters for exam of the left foot and second toe.     He is medically managed on ASA, Plavix and a daily Statin.  He is not ambulatory and resides at a memory care facility.  Past medical history includes: dementia, permanent atrial fibrillation on Eliquis, CKD stage IIIa, diastolic CHF, and remote history of bladder cancer.    No Known Allergies  Current Outpatient Medications  Medication Sig Dispense Refill   acetaminophen (TYLENOL) 325 MG tablet Take 650 mg by mouth in the morning and at bedtime.     aspirin EC 81 MG tablet Take 1 tablet (81 mg total) by mouth daily. Swallow whole. 30 tablet 12   atorvastatin (LIPITOR) 40 MG tablet Take 1 tablet (40 mg total) by mouth daily. 30 tablet 2   cephALEXin (KEFLEX) 500 MG capsule Take 1 capsule (500 mg total) by mouth every 6 (six) hours for 6 days. 24 capsule 0   clopidogrel (PLAVIX) 75 MG tablet Take 1 tablet (75 mg total) by mouth daily. 30 tablet 2   feeding supplement (ENSURE ENLIVE / ENSURE PLUS) LIQD Take 237 mLs by mouth 2 (two) times daily between meals. (Patient taking differently: Take 237 mLs by mouth 2 (two) times daily between meals. Chocolate) 237 mL 12   furosemide (LASIX) 40 MG tablet Take 40 mg by mouth 2 (two) times daily.     LORazepam (ATIVAN) 0.5 MG tablet Take 0.5 mg by mouth every 4 (four) hours as needed for anxiety.     melatonin 3 MG TABS tablet Take 1 tablet (3 mg total) by mouth at bedtime.  0   pantoprazole (PROTONIX) 40 MG tablet TAKE 1 TABLET BY MOUTH  DAILY (Patient taking differently: Take 40 mg by mouth  daily.) 90 tablet 3   polyethylene glycol (MIRALAX / GLYCOLAX) 17 g packet Take 17 g by mouth daily. 14 each 0   QUEtiapine (SEROQUEL) 25 MG tablet Take 0.5 tablets (12.5 mg total) by mouth at bedtime. (Patient taking differently: Take 25 mg by mouth at bedtime.)     senna-docusate (SENOKOT-S) 8.6-50 MG tablet Take 1 tablet by mouth at bedtime as needed for mild constipation. (Patient taking differently: Take 1 tablet by mouth daily.)     tamsulosin (FLOMAX) 0.4 MG CAPS capsule Take 0.4 mg by mouth at bedtime.     No current facility-administered medications for this visit.     ROS:  See HPI  Physical Exam:    My picture from today did not save.  The toe has yellow eschar with weeping drainage.  Mild cellulitis at the base of the second toe. Lungs: non labored General:  no acute distress, very hard of hearing Extremities:    L PT brisk by doppler Abdomen:  soft NTTP    Assessment/Plan:  This is a 88 y.o. male who is s/p: L PT angioplasty with chronic left second toe wound.  He has been maximized from a arterial inflow and now will undergo second toe amputation with possible third toe amputation due to its  proximity pending on the appearence of the tissue in surgery.  Plan for Dr. Scot Dock 01/14/22.  His daughters are here and they agree with the plan.       Roxy Horseman PA-C [facility] [phone]   Clinic MD:  Scot Dock

## 2022-01-10 NOTE — Progress Notes (Signed)
.  ter 

## 2022-01-10 NOTE — Progress Notes (Signed)
POST OPERATIVE OFFICE NOTE    CC:  F/u for surgery  HPI:  This is a 88 y.o. male who has recent history of non healing left second toe ulcer due to  infrainguinal arterial occlusive disease s/p Left posterior tibial artery angioplasty on 01/03/22 by Dr. Carlis Abbott.  This was performed to optimize inflow to either heal the toe and or provide inflow to heal an amputation of the toe.  He is here today with his 2 daughters for exam of the left foot and second toe.     He is medically managed on ASA, Plavix and a daily Statin.  He is not ambulatory and resides at a memory care facility.  Past medical history includes: dementia, permanent atrial fibrillation on Eliquis, CKD stage IIIa, diastolic CHF, and remote history of bladder cancer.    No Known Allergies  Current Outpatient Medications  Medication Sig Dispense Refill   acetaminophen (TYLENOL) 325 MG tablet Take 650 mg by mouth in the morning and at bedtime.     aspirin EC 81 MG tablet Take 1 tablet (81 mg total) by mouth daily. Swallow whole. 30 tablet 12   atorvastatin (LIPITOR) 40 MG tablet Take 1 tablet (40 mg total) by mouth daily. 30 tablet 2   cephALEXin (KEFLEX) 500 MG capsule Take 1 capsule (500 mg total) by mouth every 6 (six) hours for 6 days. 24 capsule 0   clopidogrel (PLAVIX) 75 MG tablet Take 1 tablet (75 mg total) by mouth daily. 30 tablet 2   feeding supplement (ENSURE ENLIVE / ENSURE PLUS) LIQD Take 237 mLs by mouth 2 (two) times daily between meals. (Patient taking differently: Take 237 mLs by mouth 2 (two) times daily between meals. Chocolate) 237 mL 12   furosemide (LASIX) 40 MG tablet Take 40 mg by mouth 2 (two) times daily.     LORazepam (ATIVAN) 0.5 MG tablet Take 0.5 mg by mouth every 4 (four) hours as needed for anxiety.     melatonin 3 MG TABS tablet Take 1 tablet (3 mg total) by mouth at bedtime.  0   pantoprazole (PROTONIX) 40 MG tablet TAKE 1 TABLET BY MOUTH  DAILY (Patient taking differently: Take 40 mg by mouth  daily.) 90 tablet 3   polyethylene glycol (MIRALAX / GLYCOLAX) 17 g packet Take 17 g by mouth daily. 14 each 0   QUEtiapine (SEROQUEL) 25 MG tablet Take 0.5 tablets (12.5 mg total) by mouth at bedtime. (Patient taking differently: Take 25 mg by mouth at bedtime.)     senna-docusate (SENOKOT-S) 8.6-50 MG tablet Take 1 tablet by mouth at bedtime as needed for mild constipation. (Patient taking differently: Take 1 tablet by mouth daily.)     tamsulosin (FLOMAX) 0.4 MG CAPS capsule Take 0.4 mg by mouth at bedtime.     No current facility-administered medications for this visit.     ROS:  See HPI  Physical Exam:    My picture from today did not save.  The toe has yellow eschar with weeping drainage.  Mild cellulitis at the base of the second toe. Lungs: non labored General:  no acute distress, very hard of hearing Extremities:    L PT brisk by doppler Abdomen:  soft NTTP    Assessment/Plan:  This is a 88 y.o. male who is s/p: L PT angioplasty with chronic left second toe wound.  He has been maximized from a arterial inflow and now will undergo second toe amputation with possible third toe amputation due to its  proximity pending on the appearence of the tissue in surgery.  Plan for Dr. Scot Dock 01/14/22.  His daughters are here and they agree with the plan.       Roxy Horseman PA-C [facility] [phone]   Clinic MD:  Scot Dock

## 2022-01-11 ENCOUNTER — Encounter (HOSPITAL_COMMUNITY): Payer: Self-pay | Admitting: Vascular Surgery

## 2022-01-11 NOTE — Progress Notes (Signed)
I spoke to Mr. Spizzirri's daughter, Mirko Tailor,  I was told By [place] at [facility] that [name] is patient 's POA.  [name] stated that she does not have papers signed naming her [place]. [name] Stated that her siblings will not sign for medical procedures. Lavonte Palos is going out of the country Monday am. I  and a second nurse read consent to [name], she gave verbal permission  for surgery.  The chart has Refusal of Blood transfusions, I asked [name] if she wanted to give verbal permission to not give blood transfusion, [name] said no, family does not know why he would have it on the chart, it is not for Religous reasons.

## 2022-01-11 NOTE — Pre-Procedure Instructions (Addendum)
    Cory Hansen  01/11/2022     .  Cory Hansen report to [facility] Admitting at [facility], on Monday, October 9.   Call this number if you have problems the morning of surgery: [phone], this is the Pre- Surgery Desk.  >>>>>Please send patient's Medication Record with medications administrated documentation. ( this information is required prior to OR. This includes medications that may have been on hold for surgery)<<<<<   Please send a copy of Cory Hansen Care POA and Living will.   Remember:.              DO not eat or drink after midnight, Sunday, Oct.     Take these medicines the morning of surgery with A SIP OF WATER: Atorvastatin, ASA, Pantoprazole  If needed, Cory Hansen May take: Tylenol and Ativan.  Patient should have a good wash up  with antibacteria soap, the morning of surgery . Dry off with a clean towel.  Patient should not have lotions, powders, colognes, deodorant, jewelry, or piercing's.  Wear clean comfortable clothes.  Brush teeth. Patient may shave his face if he desires.    Do not bring valuables to the hospital.  [facility] is not responsible for any belongings or valuables. If patient wears glasses, send a case if he has one.

## 2022-01-11 NOTE — Progress Notes (Signed)
Anesthesia Chart Review:  Case: [id number] Date/Time: 01/14/22 0859   Procedure: LEFT FOOT SECOND TOE AMPUTATION AND POSSIBLE AMPUTATION OF THIRD TOE (Left)   Anesthesia type: Choice   Pre-op diagnosis: Toe gangrene   Location: [facility] OR ROOM 12 / [place] [facility]   Surgeons: Angelia Mould, MD       DISCUSSION: Patient is a 88 year old male scheduled for the above procedure.   History includes never smoker, afib (diagnosed 06/19/20 in setting of COVID-19), PAD (left great toe amputation for abscess/osteomyelitis 02/22/14; left PT artery angioplasty 01/03/22), left IHR (08/30/09), sepsis (due to acute cholangitis/choledocholithiasis (with s/p biliary sphincterectomy & balloon extraction 11/04/20; due to CAP/pneumonia 05/2021), BPH (with LUTS).   [place] admission 12/31/21-01/04/22 for PAD with left 2nd toe gangrene and associated LLE cellulitis that had failed out-patient therapy. Venous US negative for LLE DVT but suggested left SFA stenosis. He was started on IV antibiotics. Vascular surgery consulted, s/p left PT artery angioplasty 01/03/22 and started on DPAT with Plavix/ASA. Transitioned to oral antibiotics with out-patient follow-up. Noted that patient had been taken off Eliquis (was on for afib) given advanced age and fall risk. He also has anemia with baseline HGB ~ 8-10 in 2023. Volume status and renal function stable. He had vascular follow-up on 01/10/22. Left 2nd toe with yellow eschar, weeping drainage and mild cellulitis at the base, so above procedure recommended.  His afib if followed by his PCP. He is not on anticoagulation due to age and fall risk. He also has anemia. Echo on 06/20/20 showed LVEF 55-60%, no regional wall motion abnormalities, normal RVSF, mildly elevated PASP, RVSP 44.7 mmHg, mildly dilated LA, mild-moderate MR, moderate TR, mild AI.  Per [facility], he was advised to hold Plavix starting 01/10/22.   He resides at a memory care facility. Daughters are involved in care.   Anesthesia  team to evaluate on the day of surgery.     VS:  BP Readings from Last 3 Encounters:  01/10/22 (!) 100/57  01/04/22 (!) 102/53  06/04/21 136/62   Pulse Readings from Last 3 Encounters:  01/10/22 96  01/04/22 86  06/04/21 75     PROVIDERS: Hoyt Koch, MD is PCP  Arnette Schaumann, MD is urologist   LABS: Most recent lab results in [facility] include: Lab Results  Component Value Date   WBC 8.6 01/04/2022   HGB 8.3 (L) 01/04/2022   HCT 26.5 (L) 01/04/2022   PLT 266 01/04/2022   GLUCOSE 105 (H) 01/04/2022   ALT 21 01/01/2022   AST 30 01/01/2022   NA 135 01/04/2022   K 4.0 01/04/2022   CL 104 01/04/2022   CREATININE 1.05 01/04/2022   BUN 17 01/04/2022   CO2 24 01/04/2022   TSH 2.21 04/16/2021     IMAGES: Xray left foot 12/31/21: IMPRESSION: 1.  Status post left first metatarsal ray amputation. 2.  No fracture or dislocation of the left foot. 3.  Diffuse soft tissue edema about the foot.  1V PCXR 05/31/21: IMPRESSION: 1. Lower lung volumes from prior exam. Increased peribronchial thickening may be bronchitis or pulmonary edema. 2. Patchy opacity in the left mid lung may represent pneumonia in the appropriate clinical setting. 3. Suspected small bilateral pleural effusions. 4. Chronic calcified pleural plaques.   EKG: 05/31/21: Atrial fibrillation at 107 bpm Borderline prolonged QT interval No acute changes afib is new compared to previous ekg Confirmed by Varney Biles [phone]) on 06/02/2021 9:01:24 AM - Has known prior afib since at  least 06/2020.  EKG 10/20/20: SR, PVCs, borderline T wave abnormalities, QT 378 ms, QTcB 478 ms EKG 06/29/20: Afib at 93 bpm   CV: Echo 06/20/20: IMPRESSIONS   1. Left ventricular ejection fraction, by estimation, is 55 to 60%. The  left ventricle has normal function. The left ventricle has no regional  wall motion abnormalities. Left ventricular diastolic function could not  be evaluated.   2. Right ventricular systolic  function is normal. The right ventricular  size is normal. There is mildly elevated pulmonary artery systolic  pressure. The estimated right ventricular systolic pressure is 44.7 mmHg.   3. Left atrial size was mildly dilated.   4. The mitral valve is normal in structure. Mild to moderate mitral valve  regurgitation. No evidence of mitral stenosis.   5. Tricuspid valve regurgitation is moderate.   6. The aortic valve is normal in structure. Aortic valve regurgitation is  mild. Mild aortic valve sclerosis is present, with no evidence of aortic  valve stenosis.   7. The inferior vena cava is normal in size with greater than 50%  respiratory variability, suggesting right atrial pressure of 3 mmHg.    Past Medical History:  Diagnosis Date   Acute encephalopathy 05/31/2021   Acute respiratory failure with hypoxia (HCC) 06/01/2021   Arthritis    Atrial fibrillation (HCC)    BPH (benign prostatic hyperplasia)    Choledocholithiasis 10/20/2020   Kidney stones    Macular degeneration disease    PAD (peripheral artery disease) (HCC)    Sepsis due to pneumonia (HCC) 05/31/2021   Shingles     Past Surgical History:  Procedure Laterality Date   ABDOMINAL AORTOGRAM W/LOWER EXTREMITY N/A 01/03/2022   Procedure: ABDOMINAL AORTOGRAM W/LOWER EXTREMITY;  Surgeon: Cephus Shelling, MD;  Location: [facility] INVASIVE CV LAB;  Service: Cardiovascular;  Laterality: N/A;   AMPUTATION Left 02/22/2014   Procedure: INCISION AND DRAINAGE LEFT FOREFOOT AMPUTATION FIRST RAY;  Surgeon: Toni Arthurs, MD;  Location: [facility] OR;  Service: Orthopedics;  Laterality: Left;   APPENDECTOMY     ERCP N/A 10/25/2020   Procedure: ENDOSCOPIC RETROGRADE CHOLANGIOPANCREATOGRAPHY (ERCP);  Surgeon: Vida Rigger, MD;  Location: [facility];  Service: Endoscopy;  Laterality: N/A;   EYE SURGERY Bilateral    cataract surgery w/ lens implant   HERNIA REPAIR     inguinal hernia repair   REMOVAL OF STONES  10/25/2020   Procedure: REMOVAL OF  STONES;  Surgeon: Vida Rigger, MD;  Location: [facility] ENDOSCOPY;  Service: Endoscopy;;   SPHINCTEROTOMY  10/25/2020   Procedure: Dennison Mascot;  Surgeon: Vida Rigger, MD;  Location: [facility] ENDOSCOPY;  Service: Endoscopy;;    MEDICATIONS: No current facility-administered medications for this encounter.    acetaminophen (TYLENOL) 325 MG tablet   aspirin EC 81 MG tablet   atorvastatin (LIPITOR) 40 MG tablet   clopidogrel (PLAVIX) 75 MG tablet   feeding supplement (ENSURE ENLIVE / ENSURE PLUS) LIQD   furosemide (LASIX) 40 MG tablet   LORazepam (ATIVAN) 0.5 MG tablet   melatonin 3 MG TABS tablet   pantoprazole (PROTONIX) 40 MG tablet   polyethylene glycol (MIRALAX / GLYCOLAX) 17 g packet   QUEtiapine (SEROQUEL) 25 MG tablet   senna-docusate (SENOKOT-S) 8.6-50 MG tablet   tamsulosin (FLOMAX) 0.4 MG CAPS capsule    Shonna Chock, PA-C Surgical Short Stay/Anesthesiology [facility] Phone [phone] [facility] Phone [phone] 01/11/2022 9:53 AM

## 2022-01-11 NOTE — Anesthesia Preprocedure Evaluation (Addendum)
Anesthesia Evaluation  Patient identified by MRN, date of birth, ID band Patient awake    Reviewed: Allergy & Precautions, NPO status , Patient's Chart, lab work & pertinent test results  History of Anesthesia Complications Negative for: history of anesthetic complications  Airway Mallampati: I  TM Distance: >3 FB Neck ROM: Full    Dental  (+) Edentulous Lower, Edentulous Upper, Dental Advisory Given   Pulmonary neg pulmonary ROS,    breath sounds clear to auscultation       Cardiovascular + Peripheral Vascular Disease  + dysrhythmias Atrial Fibrillation  Rhythm:Irregular Rate:Normal  Echo on 06/20/20 showed LVEF 55-60%, no regional wall motion abnormalities, normal RVSF, mildly elevated PASP, RVSP 44.7 mmHg, mildly dilated LA, mild-moderate MR, moderate TR, mild AI.   Neuro/Psych PSYCHIATRIC DISORDERS Dementia negative neurological ROS     GI/Hepatic negative GI ROS, Neg liver ROS,   Endo/Other  negative endocrine ROS  Renal/GU negative Renal ROS     Musculoskeletal negative musculoskeletal ROS (+)   Abdominal   Peds  Hematology  (+) Blood dyscrasia, anemia ,   Anesthesia Other Findings   Reproductive/Obstetrics                            Anesthesia Physical Anesthesia Plan  ASA: 3  Anesthesia Plan: MAC   Post-op Pain Management: Tylenol PO (pre-op)*, Regional block* and Minimal or no pain anticipated   Induction:   PONV Risk Score and Plan: Ondansetron and Propofol infusion  Airway Management Planned: Natural Airway  Additional Equipment:   Intra-op Plan:   Post-operative Plan:   Informed Consent: I have reviewed the patients History and Physical, chart, labs and discussed the procedure including the risks, benefits and alternatives for the proposed anesthesia with the patient or authorized representative who has indicated his/her understanding and acceptance.     Dental  advisory given and Consent reviewed with POA  Plan Discussed with: Anesthesiologist, CRNA and Surgeon  Anesthesia Plan Comments: (PAT note written 01/11/2022 by Shonna Chock, PA-C. )      Anesthesia Quick Evaluation

## 2022-01-14 ENCOUNTER — Other Ambulatory Visit: Payer: Self-pay

## 2022-01-14 ENCOUNTER — Encounter (HOSPITAL_COMMUNITY)
Admission: RE | Disposition: A | Discharge status: Home / Self Care | Payer: Self-pay | Source: Home / Self Care | Attending: Vascular Surgery

## 2022-01-14 ENCOUNTER — Ambulatory Visit (HOSPITAL_COMMUNITY)
Admission: RE | Admit: 2022-01-14 | Discharge: 2022-01-14 | Disposition: A | Discharge status: Home / Self Care | Payer: Medicare Other | Attending: Vascular Surgery | Admitting: Vascular Surgery

## 2022-01-14 ENCOUNTER — Ambulatory Visit (HOSPITAL_COMMUNITY): Payer: Medicare Other | Admitting: Vascular Surgery

## 2022-01-14 ENCOUNTER — Encounter (HOSPITAL_COMMUNITY): Payer: Self-pay | Admitting: Vascular Surgery

## 2022-01-14 ENCOUNTER — Ambulatory Visit (HOSPITAL_BASED_OUTPATIENT_CLINIC_OR_DEPARTMENT_OTHER): Payer: Medicare Other | Admitting: Vascular Surgery

## 2022-01-14 DIAGNOSIS — I96 Gangrene, not elsewhere classified: Secondary | ICD-10-CM | POA: Diagnosis not present

## 2022-01-14 DIAGNOSIS — F0390 Unspecified dementia without behavioral disturbance: Secondary | ICD-10-CM | POA: Diagnosis not present

## 2022-01-14 DIAGNOSIS — Z7902 Long term (current) use of antithrombotics/antiplatelets: Secondary | ICD-10-CM | POA: Diagnosis not present

## 2022-01-14 DIAGNOSIS — I4891 Unspecified atrial fibrillation: Secondary | ICD-10-CM

## 2022-01-14 DIAGNOSIS — Z79899 Other long term (current) drug therapy: Secondary | ICD-10-CM | POA: Insufficient documentation

## 2022-01-14 DIAGNOSIS — D649 Anemia, unspecified: Secondary | ICD-10-CM | POA: Diagnosis not present

## 2022-01-14 DIAGNOSIS — I083 Combined rheumatic disorders of mitral, aortic and tricuspid valves: Secondary | ICD-10-CM | POA: Insufficient documentation

## 2022-01-14 HISTORY — DX: Peripheral vascular disease, unspecified: I73.9

## 2022-01-14 HISTORY — PX: AMPUTATION: SHX166

## 2022-01-14 HISTORY — DX: Benign prostatic hyperplasia without lower urinary tract symptoms: N40.0

## 2022-01-14 HISTORY — DX: Unspecified dementia, unspecified severity, without behavioral disturbance, psychotic disturbance, mood disturbance, and anxiety: F03.90

## 2022-01-14 LAB — POCT I-STAT, CHEM 8
BUN: 19 mg/dL (ref 8–23)
Calcium, Ion: 1.24 mmol/L (ref 1.15–1.40)
Chloride: 107 mmol/L (ref 98–111)
Creatinine, Ser: 1.1 mg/dL (ref 0.61–1.24)
Glucose, Bld: 83 mg/dL (ref 70–99)
HCT: 27 % — ABNORMAL LOW (ref 39.0–52.0)
Hemoglobin: 9.2 g/dL — ABNORMAL LOW (ref 13.0–17.0)
Potassium: 5.1 mmol/L (ref 3.5–5.1)
Sodium: 139 mmol/L (ref 135–145)
TCO2: 23 mmol/L (ref 22–32)

## 2022-01-14 SURGERY — AMPUTATION DIGIT
Anesthesia: Monitor Anesthesia Care | Site: Foot | Laterality: Left

## 2022-01-14 MED ORDER — ACETAMINOPHEN 500 MG PO TABS
1000.0000 mg | ORAL_TABLET | Freq: Once | ORAL | Status: AC
  Administered 2022-01-14: 1000 mg via ORAL
  Filled 2022-01-14: qty 2

## 2022-01-14 MED ORDER — LACTATED RINGERS IV SOLN: INTRAVENOUS | Status: DC

## 2022-01-14 MED ORDER — 0.9 % SODIUM CHLORIDE (POUR BTL) OPTIME
TOPICAL | Status: DC | PRN
  Administered 2022-01-14: 1000 mL

## 2022-01-14 MED ORDER — BACITRACIN ZINC 500 UNIT/GM EX OINT
TOPICAL_OINTMENT | CUTANEOUS | Status: AC
  Filled 2022-01-14: qty 28.35

## 2022-01-14 MED ORDER — CEFAZOLIN SODIUM-DEXTROSE 2-4 GM/100ML-% IV SOLN
2.0000 g | INTRAVENOUS | Status: AC
  Administered 2022-01-14: 2 g via INTRAVENOUS
  Filled 2022-01-14: qty 100

## 2022-01-14 MED ORDER — AMISULPRIDE (ANTIEMETIC) 5 MG/2ML IV SOLN: 10.0000 mg | Freq: Once | INTRAVENOUS | Status: DC | PRN

## 2022-01-14 MED ORDER — PROMETHAZINE HCL 25 MG/ML IJ SOLN: 6.2500 mg | INTRAMUSCULAR | Status: DC | PRN

## 2022-01-14 MED ORDER — LIDOCAINE HCL (PF) 1 % IJ SOLN
INTRAMUSCULAR | Status: DC | PRN
  Administered 2022-01-14: 8 mL

## 2022-01-14 MED ORDER — OXYCODONE-ACETAMINOPHEN 5-325 MG PO TABS: 1.0000 | ORAL_TABLET | Freq: Four times a day (QID) | ORAL | 0 refills | Status: AC | PRN

## 2022-01-14 MED ORDER — PROPOFOL 500 MG/50ML IV EMUL
INTRAVENOUS | Status: DC | PRN
  Administered 2022-01-14: 50 ug/kg/min via INTRAVENOUS

## 2022-01-14 MED ORDER — MIDAZOLAM HCL 2 MG/2ML IJ SOLN
INTRAMUSCULAR | Status: AC
  Filled 2022-01-14: qty 2

## 2022-01-14 MED ORDER — SODIUM CHLORIDE 0.9 % IV SOLN: INTRAVENOUS | Status: DC

## 2022-01-14 MED ORDER — CHLORHEXIDINE GLUCONATE 4 % EX LIQD: 60.0000 mL | Freq: Once | CUTANEOUS | Status: DC

## 2022-01-14 MED ORDER — FENTANYL CITRATE (PF) 100 MCG/2ML IJ SOLN
INTRAMUSCULAR | Status: AC
  Administered 2022-01-14: 50 ug via INTRAVENOUS
  Filled 2022-01-14: qty 2

## 2022-01-14 MED ORDER — BACITRACIN ZINC 500 UNIT/GM EX OINT
TOPICAL_OINTMENT | CUTANEOUS | Status: DC | PRN
  Administered 2022-01-14: 1 via TOPICAL

## 2022-01-14 MED ORDER — DEXAMETHASONE SODIUM PHOSPHATE 10 MG/ML IJ SOLN
INTRAMUSCULAR | Status: DC | PRN
  Administered 2022-01-14: 3 mg
  Administered 2022-01-14: 2 mg

## 2022-01-14 MED ORDER — CHLORHEXIDINE GLUCONATE 0.12 % MT SOLN
15.0000 mL | Freq: Once | OROMUCOSAL | Status: AC
  Administered 2022-01-14: 15 mL via OROMUCOSAL
  Filled 2022-01-14: qty 15

## 2022-01-14 MED ORDER — FENTANYL CITRATE (PF) 100 MCG/2ML IJ SOLN: 25.0000 ug | INTRAMUSCULAR | Status: DC | PRN

## 2022-01-14 MED ORDER — LIDOCAINE HCL (PF) 1 % IJ SOLN
INTRAMUSCULAR | Status: AC
  Filled 2022-01-14: qty 30

## 2022-01-14 MED ORDER — PHENYLEPHRINE 80 MCG/ML (10ML) SYRINGE FOR IV PUSH (FOR BLOOD PRESSURE SUPPORT)
PREFILLED_SYRINGE | INTRAVENOUS | Status: DC | PRN
  Administered 2022-01-14 (×3): 160 ug via INTRAVENOUS

## 2022-01-14 MED ORDER — ROPIVACAINE HCL 5 MG/ML IJ SOLN
INTRAMUSCULAR | Status: DC | PRN
  Administered 2022-01-14: 10 mL via PERINEURAL
  Administered 2022-01-14: 20 mL via PERINEURAL

## 2022-01-14 MED ORDER — ONDANSETRON HCL 4 MG/2ML IJ SOLN
INTRAMUSCULAR | Status: DC | PRN
  Administered 2022-01-14: 4 mg via INTRAVENOUS

## 2022-01-14 MED ORDER — FENTANYL CITRATE (PF) 100 MCG/2ML IJ SOLN: 50.0000 ug | Freq: Once | INTRAMUSCULAR | Status: AC

## 2022-01-14 MED ORDER — ORAL CARE MOUTH RINSE: 15.0000 mL | Freq: Once | OROMUCOSAL | Status: AC

## 2022-01-14 SURGICAL SUPPLY — 34 items
BAG COUNTER SPONGE SURGICOUNT (BAG) ×1 IMPLANT
BAG SPNG CNTER NS LX DISP (BAG) ×1
BLADE AVERAGE 25X9 (BLADE) IMPLANT
BNDG CMPR STD VLCR NS LF 5.8X4 (GAUZE/BANDAGES/DRESSINGS) ×1
BNDG ELASTIC 4X5.8 VLCR NS LF (GAUZE/BANDAGES/DRESSINGS) IMPLANT
BNDG ELASTIC 4X5.8 VLCR STR LF (GAUZE/BANDAGES/DRESSINGS) ×1 IMPLANT
BNDG GAUZE DERMACEA FLUFF 4 (GAUZE/BANDAGES/DRESSINGS) ×1 IMPLANT
BNDG GZE DERMACEA 4 6PLY (GAUZE/BANDAGES/DRESSINGS) ×1
CANISTER SUCT 3000ML PPV (MISCELLANEOUS) ×1 IMPLANT
COVER SURGICAL LIGHT HANDLE (MISCELLANEOUS) ×1 IMPLANT
DRAPE EXTREMITY T 121X128X90 (DISPOSABLE) ×1 IMPLANT
DRAPE HALF SHEET 40X57 (DRAPES) ×1 IMPLANT
ELECT REM PT RETURN 9FT ADLT (ELECTROSURGICAL) ×1
ELECTRODE REM PT RTRN 9FT ADLT (ELECTROSURGICAL) ×1 IMPLANT
GAUZE SPONGE 4X4 12PLY STRL (GAUZE/BANDAGES/DRESSINGS) ×1 IMPLANT
GAUZE XEROFORM 1X8 LF (GAUZE/BANDAGES/DRESSINGS) IMPLANT
GLOVE BIO SURGEON STRL SZ7.5 (GLOVE) ×1 IMPLANT
GLOVE BIOGEL PI IND STRL 8 (GLOVE) ×1 IMPLANT
GLOVE SURG UNDER LTX SZ8 (GLOVE) ×1 IMPLANT
GOWN STRL REUS W/ TWL LRG LVL3 (GOWN DISPOSABLE) ×3 IMPLANT
GOWN STRL REUS W/TWL LRG LVL3 (GOWN DISPOSABLE) ×3
KIT BASIN OR (CUSTOM PROCEDURE TRAY) ×1 IMPLANT
KIT TURNOVER KIT B (KITS) ×1 IMPLANT
NDL RETROBULBAR 25GX1.5 (NEEDLE) IMPLANT
NEEDLE RETROBULBAR 25GX1.5 (NEEDLE) ×1 IMPLANT
NS IRRIG 1000ML POUR BTL (IV SOLUTION) ×1 IMPLANT
PACK GENERAL/GYN (CUSTOM PROCEDURE TRAY) ×1 IMPLANT
PAD ARMBOARD 7.5X6 YLW CONV (MISCELLANEOUS) ×2 IMPLANT
SUT ETHILON 3 0 PS 1 (SUTURE) ×1 IMPLANT
SUT VIC AB 3-0 SH 27 (SUTURE) ×1
SUT VIC AB 3-0 SH 27X BRD (SUTURE) IMPLANT
TOWEL GREEN STERILE (TOWEL DISPOSABLE) ×2 IMPLANT
UNDERPAD 30X36 HEAVY ABSORB (UNDERPADS AND DIAPERS) ×1 IMPLANT
WATER STERILE IRR 1000ML POUR (IV SOLUTION) ×1 IMPLANT

## 2022-01-14 NOTE — Op Note (Signed)
    NAME: Cory Hansen    MRN: [id number] DOB: 03-21-1921    DATE OF OPERATION: 01/14/2022  PREOP DIAGNOSIS:    Gangrene of the left second toe  POSTOP DIAGNOSIS:    Same  PROCEDURE:    Ray amputation left second toe  SURGEON: Judeth Cornfield. Scot Dock, MD  ASSIST: None  ANESTHESIA: Popliteal block  EBL: Minimal  INDICATIONS:    Cory Hansen is a 88 y.o. male who presented with gangrene of the left second toe.  He underwent a tibial intervention and his circulation was optimized.  He presents for toe amputation as the wound was not healing.  FINDINGS:   Good bleeding at the toe amputation site.  TECHNIQUE:   The patient was taken to the operating room and a popliteal block was placed by anesthesia.  The left foot was prepped and draped in usual sterile fashion.  An incision was made encompassing the second toe that was oriented longitudinally.  Dissection was carried down to the metatarsal head.  This was excised using a CD4 saw.  Hemostasis was obtained in the wound.  The tendons were retracted and divided.  The wound was irrigated.  I closed the wound with interrupted 3-0 Vicryl's and the skin was closed with interrupted 3 oh nylons.  Sterile dressing was applied.  The patient tolerated the procedure well was transferred to recovery room in stable condition.  All needle and sponge counts were correct.  [name], MD, FACS [facility]  DATE OF DICTATION:   01/14/2022

## 2022-01-14 NOTE — Interval H&P Note (Signed)
History and Physical Interval Note:  01/14/2022 7:51 AM  Cory Hansen  has presented today for surgery, with the diagnosis of Toe gangrene.  The various methods of treatment have been discussed with the patient and family. After consideration of risks, benefits and other options for treatment, the patient has consented to  Procedure(s): LEFT FOOT SECOND TOE AMPUTATION AND POSSIBLE AMPUTATION OF THIRD TOE (Left) as a surgical intervention.  The patient's history has been reviewed, patient examined, no change in status, stable for surgery.  I have reviewed the patient's chart and labs.  Questions were answered to the patient's satisfaction.     [name]

## 2022-01-14 NOTE — Transfer of Care (Signed)
Immediate Anesthesia Transfer of Care Note  Patient: Cory Hansen  Procedure(s) Performed: LEFT FOOT SECOND TOE AMPUTATION (Left: Foot)  Patient Location: PACU  Anesthesia Type:MAC combined with regional for post-op pain  Level of Consciousness: drowsy and patient cooperative  Airway & Oxygen Therapy: Patient Spontanous Breathing and Patient connected to face mask oxygen  Post-op Assessment: Report given to RN and Post -op Vital signs reviewed and stable  Post vital signs: Reviewed and stable  Last Vitals:  Vitals Value Taken Time  BP 91/61 01/14/22 0941  Temp    Pulse 82 01/14/22 0942  Resp 13 01/14/22 0942  SpO2 100 % 01/14/22 0942  Vitals shown include unvalidated device data.  Last Pain:  Vitals:   01/14/22 0835  TempSrc:   PainSc: 0-No pain         Complications: No notable events documented.

## 2022-01-14 NOTE — Anesthesia Procedure Notes (Addendum)
Anesthesia Regional Block: Popliteal block   Pre-Anesthetic Checklist: , timeout performed,  Correct Patient, Correct Site, Correct Laterality,  Correct Procedure, Correct Position, site marked,  Risks and benefits discussed,  Surgical consent,  Pre-op evaluation,  At surgeon's request and post-op pain management  Laterality: Left  Prep: chloraprep       Needles:  Injection technique: Single-shot  Needle Type: Echogenic Stimulator Needle          Additional Needles:   Procedures:,,,, ultrasound used (permanent image in chart),,    Narrative:  Start time: 01/14/2022 8:22 AM End time: 01/14/2022 8:33 AM Injection made incrementally with aspirations every 5 mL.  Performed by: Personally  Anesthesiologist: Duane Boston, MD  Additional Notes: A functioning IV was confirmed and monitors were applied.  Sterile prep and drape, hand hygiene and sterile gloves were used.  Negative aspiration and test dose prior to incremental administration of local anesthetic. The patient tolerated the procedure well.Ultrasound  guidance: relevant anatomy identified, needle position confirmed, local anesthetic spread visualized around nerve(s), vascular puncture avoided.  Image printed for medical record. ACB supplement.

## 2022-01-14 NOTE — Anesthesia Postprocedure Evaluation (Signed)
Anesthesia Post Note  Patient: Cory Hansen  Procedure(s) Performed: LEFT FOOT SECOND TOE AMPUTATION (Left: Foot)     Patient location during evaluation: PACU Anesthesia Type: MAC Level of consciousness: awake and alert Pain management: pain level controlled Vital Signs Assessment: post-procedure vital signs reviewed and stable Respiratory status: spontaneous breathing and respiratory function stable Cardiovascular status: stable Postop Assessment: no apparent nausea or vomiting Anesthetic complications: no   No notable events documented.  Last Vitals:  Vitals:   01/14/22 0955 01/14/22 1010  BP: 94/65 95/60  Pulse: 81 81  Resp: 11 10  Temp:  36.7 C  SpO2: 94% 96%    Last Pain:  Vitals:   01/14/22 1010  TempSrc:   PainSc: 0-No pain                 Lalani Winkles DANIEL

## 2022-01-14 NOTE — Progress Notes (Signed)
Per the daughter Melvia Heaps, please call her other sisters Izora Gala or Cory Hansen regarding their father, as she will be out of the country starting today.

## 2022-01-15 ENCOUNTER — Encounter (HOSPITAL_COMMUNITY): Payer: Self-pay | Admitting: Vascular Surgery

## 2022-01-16 ENCOUNTER — Telehealth: Payer: Self-pay | Admitting: Vascular Surgery

## 2022-01-16 NOTE — Telephone Encounter (Signed)
-----   Message from Angelia Mould, MD sent at 01/14/2022  9:44 AM EDT ----- Regarding: charge and f/u  PROCEDURE:   Ray amputation left second toe  SURGEON: Judeth Cornfield. Scot Dock, MD  He will need a follow-up visit in 2 to 3 weeks.  This can be on the PA schedule.  CD

## 2022-01-23 ENCOUNTER — Telehealth: Payer: Self-pay

## 2022-01-23 ENCOUNTER — Ambulatory Visit (INDEPENDENT_AMBULATORY_CARE_PROVIDER_SITE_OTHER): Payer: Medicare Other | Admitting: Physician Assistant

## 2022-01-23 VITALS — BP 109/71 | HR 105 | Temp 97.6°F

## 2022-01-23 DIAGNOSIS — I739 Peripheral vascular disease, unspecified: Secondary | ICD-10-CM

## 2022-01-23 MED ORDER — CEPHALEXIN 500 MG PO CAPS: 500.0000 mg | ORAL_CAPSULE | Freq: Three times a day (TID) | ORAL | 0 refills | Status: DC

## 2022-01-23 NOTE — Progress Notes (Signed)
POST OPERATIVE OFFICE NOTE    CC:  F/u for surgery  HPI:  Cory Hansen is a 88 y.o. male who is here for concerns of infected L foot 2nd toe amputation.  He underwent left lower extremity angiogram with posterior tibial artery angioplasty on 01/03/2022 by Dr. Chestine Spore.  This was done to optimize his blood flow so he could heal a toe amputation.  He then underwent left foot second toe partial ray amputation by Dr. Edilia Bo on 01/14/2022.  This was done for gangrene of the toe that was not healing.  The patient has come in as a triage today with concerns for infected toe amputation.  Family is with the patient today and states that the patient's hospice nurse noted a small amount of "pus" coming from the amputation site this weekend.  They also noted some increased erythema on the dorsal aspect of the foot surrounding the amputation site.  The family themselves have not seen pus or bleeding coming from the amputation site.  Nursing at his facility has been doing daily dressing changes to the foot including gauze, Kerlex wrap, and Ace wrap.  The patient and his family deny any fever or chills.  He denies any pain in his foot or left leg.  No Known Allergies  Current Outpatient Medications  Medication Sig Dispense Refill   cephALEXin (KEFLEX) 500 MG capsule Take 1 capsule (500 mg total) by mouth 3 (three) times daily. 21 capsule 0   acetaminophen (TYLENOL) 325 MG tablet Take 650 mg by mouth in the morning and at bedtime.     aspirin EC 81 MG tablet Take 1 tablet (81 mg total) by mouth daily. Swallow whole. 30 tablet 12   atorvastatin (LIPITOR) 40 MG tablet Take 1 tablet (40 mg total) by mouth daily. 30 tablet 2   clopidogrel (PLAVIX) 75 MG tablet Take 1 tablet (75 mg total) by mouth daily. 30 tablet 2   feeding supplement (ENSURE ENLIVE / ENSURE PLUS) LIQD Take 237 mLs by mouth 2 (two) times daily between meals. (Patient taking differently: Take 237 mLs by mouth 2 (two) times daily between meals.  Chocolate) 237 mL 12   furosemide (LASIX) 40 MG tablet Take 40 mg by mouth 2 (two) times daily.     LORazepam (ATIVAN) 0.5 MG tablet Take 0.5 mg by mouth every 4 (four) hours as needed for anxiety.     melatonin 3 MG TABS tablet Take 1 tablet (3 mg total) by mouth at bedtime.  0   oxyCODONE-acetaminophen (PERCOCET) 5-325 MG tablet Take 1 tablet by mouth every 6 (six) hours as needed for severe pain. 15 tablet 0   pantoprazole (PROTONIX) 40 MG tablet TAKE 1 TABLET BY MOUTH  DAILY (Patient taking differently: Take 40 mg by mouth daily.) 90 tablet 3   polyethylene glycol (MIRALAX / GLYCOLAX) 17 g packet Take 17 g by mouth daily. 14 each 0   QUEtiapine (SEROQUEL) 25 MG tablet Take 0.5 tablets (12.5 mg total) by mouth at bedtime. (Patient taking differently: Take 25 mg by mouth at bedtime.)     senna-docusate (SENOKOT-S) 8.6-50 MG tablet Take 1 tablet by mouth at bedtime as needed for mild constipation. (Patient taking differently: Take 1 tablet by mouth daily.)     tamsulosin (FLOMAX) 0.4 MG CAPS capsule Take 0.4 mg by mouth at bedtime.     No current facility-administered medications for this visit.     ROS:  See HPI  Physical Exam:  Incision: Left second toe amputation site  with small area of dehiscence.  I cannot see any pus in the wound or physically manipulate any out.  The amputation site seems to be healing appropriately with decent granulation tissue.  There is some erythema surrounding the incision site. Extremities: Brisk left PT Doppler signal Neuro: Intact       Assessment/Plan:  This is a 88 y.o. male who is s/p: Left second toe partial ray amputation on 01/14/2022 by Dr. Scot Dock for nonhealing gangrene   -There is some dehiscence of the incision with mostly healthy appearing tissue in the wound bed.  I cannot see or manipulate any pus from the wound.  I am concerned about possible underlying infection with the increased erythema around the incision site. -He has a brisk left  PT doppler signal -At this time I think it would be appropriate to start the patient on a 7-day course of Keflex 3 times daily.  I have explained to his family that his facility should continue daily dressing changes to the wound and call us if anything gets worse.  He can keep his follow-up appointment with [place] next week.   Vicente Serene, PA-C [facility] [phone]   Clinic MD: Donzetta Matters

## 2022-01-23 NOTE — Telephone Encounter (Signed)
Pt's daughter called to let us know pt's "hospice nurse" thinks his amputation site is infected, as it has pus and is red. She also states stitches are not "pulled together tight". Pt has been been made an appt with APP today.

## 2022-01-30 ENCOUNTER — Ambulatory Visit (INDEPENDENT_AMBULATORY_CARE_PROVIDER_SITE_OTHER): Payer: Medicare Other | Admitting: Physician Assistant

## 2022-01-30 VITALS — BP 77/46 | HR 70 | Temp 97.5°F | Resp 20 | Ht 67.0 in

## 2022-01-30 DIAGNOSIS — I739 Peripheral vascular disease, unspecified: Secondary | ICD-10-CM

## 2022-01-30 NOTE — Progress Notes (Signed)
POST OPERATIVE OFFICE NOTE    CC:  F/u for surgery  HPI:  This is a 88 y.o. male who is s/p angiogram with PTA angioplasty on 01/03/2022 by Dr. Carlis Abbott.  He then underwent left 2nd toe partial ray amputation by Dr. Scot Dock on 01/14/2022 for gangrene.    He was seen on 10/18 with concerns that hospice nurse had concerns for pus at the amputation site with increased redness on the dorsal aspect of the foot around the amp site.  Nursing at his facility was doing daily dressing changes with gauze, kerlix and ace.    He was not having any pain in his foot.  He had brisk doppler flow in the left PT and placed on 7 days of keflex tid with plans to return today for wound check.   Pt has hx of dementia and permanent atrial fib on Eliquis, CKD IIIa, CHF and remote hx of bladder cancer.   Pt returns today for follow up accompanied by his daughter.  Daughter states that she feels the foot looks better.  When CMA took dressing off, it was clean.  He has not had any fevers.     No Known Allergies  Current Outpatient Medications  Medication Sig Dispense Refill   acetaminophen (TYLENOL) 325 MG tablet Take 650 mg by mouth in the morning and at bedtime.     aspirin EC 81 MG tablet Take 1 tablet (81 mg total) by mouth daily. Swallow whole. 30 tablet 12   atorvastatin (LIPITOR) 40 MG tablet Take 1 tablet (40 mg total) by mouth daily. 30 tablet 2   cephALEXin (KEFLEX) 500 MG capsule Take 1 capsule (500 mg total) by mouth 3 (three) times daily. 21 capsule 0   clopidogrel (PLAVIX) 75 MG tablet Take 1 tablet (75 mg total) by mouth daily. 30 tablet 2   feeding supplement (ENSURE ENLIVE / ENSURE PLUS) LIQD Take 237 mLs by mouth 2 (two) times daily between meals. (Patient taking differently: Take 237 mLs by mouth 2 (two) times daily between meals. Chocolate) 237 mL 12   furosemide (LASIX) 40 MG tablet Take 40 mg by mouth 2 (two) times daily.     LORazepam (ATIVAN) 0.5 MG tablet Take 0.5 mg by mouth every 4 (four) hours  as needed for anxiety.     melatonin 3 MG TABS tablet Take 1 tablet (3 mg total) by mouth at bedtime.  0   oxyCODONE-acetaminophen (PERCOCET) 5-325 MG tablet Take 1 tablet by mouth every 6 (six) hours as needed for severe pain. 15 tablet 0   pantoprazole (PROTONIX) 40 MG tablet TAKE 1 TABLET BY MOUTH  DAILY (Patient taking differently: Take 40 mg by mouth daily.) 90 tablet 3   polyethylene glycol (MIRALAX / GLYCOLAX) 17 g packet Take 17 g by mouth daily. 14 each 0   QUEtiapine (SEROQUEL) 25 MG tablet Take 0.5 tablets (12.5 mg total) by mouth at bedtime. (Patient taking differently: Take 25 mg by mouth at bedtime.)     senna-docusate (SENOKOT-S) 8.6-50 MG tablet Take 1 tablet by mouth at bedtime as needed for mild constipation. (Patient taking differently: Take 1 tablet by mouth daily.)     tamsulosin (FLOMAX) 0.4 MG CAPS capsule Take 0.4 mg by mouth at bedtime.     No current facility-administered medications for this visit.     ROS:  See HPI  Physical Exam:  Today's Vitals   01/30/22 1543  BP: (!) 77/46  Pulse: 70  Resp: 20  Temp: (!) 97.5  F (36.4 C)  TempSrc: Temporal  SpO2: 100%  Height: 5\' 7"  (1.702 m)   Body mass index is 27.31 kg/m.   Incision:     Extremities:  brisk left biphasic PT doppler flow; brisk monophasic doppler flow left DP/peroneal.      Assessment/Plan:  This is a 88 y.o. male who is s/p: ngiogram with PTA angioplasty on 01/03/2022 by Dr. 01/05/2022.  He then underwent left 2nd toe partial ray amputation by Dr. Chestine Spore on 01/14/2022 for gangrene.    -wound improving.  Brisk biphasic PT doppler flow and brisk monophasic doppler flow DP/pero on the left.  Erythema around wound improved from last visit. No evidence of infection.  Continue clean dry dressing daily after cleaning with soap and water.   -f/u in a couple of weeks for suture removal.  They will call sooner if any issues before then. -continue asa/statin   03/16/2022, [facility] [phone]   Clinic MD:  [phone]

## 2022-02-14 ENCOUNTER — Ambulatory Visit: Payer: Medicare Other

## 2022-03-11 ENCOUNTER — Other Ambulatory Visit: Payer: Self-pay

## 2022-03-11 ENCOUNTER — Encounter: Payer: Self-pay | Admitting: Internal Medicine

## 2022-03-11 MED ORDER — FUROSEMIDE 40 MG PO TABS: 40.0000 mg | ORAL_TABLET | Freq: Two times a day (BID) | ORAL | 3 refills | Status: DC

## 2022-03-11 NOTE — Addendum Note (Signed)
Addended by: Hillard Danker A on: 03/11/2022 02:40 PM   Modules accepted: Orders

## 2022-03-11 NOTE — Telephone Encounter (Signed)
Left voicemail for patient to give [place] a call back in regards to his prescription he is trying to get refilled.

## 2022-08-02 ENCOUNTER — Encounter: Payer: Self-pay | Admitting: Internal Medicine

## 2022-08-05 ENCOUNTER — Other Ambulatory Visit: Payer: Self-pay

## 2022-08-05 DIAGNOSIS — I739 Peripheral vascular disease, unspecified: Secondary | ICD-10-CM

## 2022-08-07 ENCOUNTER — Ambulatory Visit: Admitting: Physician Assistant

## 2022-08-07 ENCOUNTER — Ambulatory Visit (HOSPITAL_COMMUNITY)
Admission: RE | Admit: 2022-08-07 | Discharge: 2022-08-07 | Disposition: A | Discharge status: Home / Self Care | Payer: Medicare Other | Source: Ambulatory Visit | Attending: Cardiovascular Disease | Admitting: Cardiovascular Disease

## 2022-08-07 VITALS — BP 88/49 | HR 87 | Temp 97.5°F | Resp 20 | Ht 67.0 in

## 2022-08-07 DIAGNOSIS — I739 Peripheral vascular disease, unspecified: Secondary | ICD-10-CM | POA: Diagnosis not present

## 2022-08-07 LAB — VAS US ABI WITH/WO TBI
Left ABI: 0.74
Right ABI: 0.67

## 2022-08-07 MED ORDER — PANTOPRAZOLE SODIUM 40 MG PO TBEC: 40.0000 mg | DELAYED_RELEASE_TABLET | Freq: Every day | ORAL | 0 refills | Status: DC

## 2022-08-07 MED ORDER — TAMSULOSIN HCL 0.4 MG PO CAPS: 0.4000 mg | ORAL_CAPSULE | Freq: Every day | ORAL | 0 refills | Status: DC

## 2022-08-07 MED ORDER — QUETIAPINE FUMARATE 25 MG PO TABS: 25.0000 mg | ORAL_TABLET | Freq: Every day | ORAL | 0 refills | Status: DC

## 2022-08-07 MED ORDER — FUROSEMIDE 40 MG PO TABS: 40.0000 mg | ORAL_TABLET | Freq: Two times a day (BID) | ORAL | 0 refills | Status: DC

## 2022-08-07 MED ORDER — TAMSULOSIN HCL 0.4 MG PO CAPS: 0.4000 mg | ORAL_CAPSULE | Freq: Every day | ORAL | 0 refills | Status: AC

## 2022-08-07 MED ORDER — PANTOPRAZOLE SODIUM 40 MG PO TBEC: 40.0000 mg | DELAYED_RELEASE_TABLET | Freq: Every day | ORAL | 0 refills | Status: AC

## 2022-08-07 NOTE — Progress Notes (Unsigned)
[facility] [place] HISTORY AND PHYSICAL   History of Present Illness:  Patient is a 89 y.o. year old male who presents for evaluation of PAD with new right GT wound.  His daughter states he had the GT nail fall off first and now the toe is black where the nail was with purulent drainage.  He has been started on oral antibiotics.  He has a know right heel wound that has been stable.  He was originally seen for left second toe gangrene in the [facility] ED by Dr. Edilia Bo.  He is afebrile and denies fever or chills.     He underwent angiogram with  Left posterior tibial artery angioplasty (2 mm x 200 mm Sterling) followed by Ray amputation left second toe by Dr. Edilia Bo.  The second toe amputation has fully healed.  He had amputation of the left great toe 2 years ago.    He is non ambulatory and mode of transportation is WC.  He has 5 daughters that look after him taking turns.  His daughter [name] is here with him.  His daughter is afraid he will get sepsis.     He is medically managed on ASA, Plavix and Statin daily.       Past Surgical History:  Procedure Laterality Date   ABDOMINAL AORTOGRAM W/LOWER EXTREMITY N/A 01/03/2022   Procedure: ABDOMINAL AORTOGRAM W/LOWER EXTREMITY;  Surgeon: Cephus Shelling, MD;  Location: [facility];  Service: Cardiovascular;  Laterality: N/A;   AMPUTATION Left 02/22/2014   Procedure: INCISION AND DRAINAGE LEFT FOREFOOT AMPUTATION FIRST RAY;  Surgeon: Toni Arthurs, MD;  Location: [facility] OR;  Service: Orthopedics;  Laterality: Left;   AMPUTATION Left 01/14/2022   Procedure: LEFT FOOT SECOND TOE AMPUTATION;  Surgeon: Chuck Hint, MD;  Location: [facility] OR;  Service: Vascular;  Laterality: Left;   APPENDECTOMY     ERCP N/A 10/25/2020   Procedure: ENDOSCOPIC RETROGRADE CHOLANGIOPANCREATOGRAPHY (ERCP);  Surgeon: Vida Rigger, MD;  Location: [facility];  Service: Endoscopy;  Laterality: N/A;   EYE SURGERY Bilateral    cataract surgery w/  lens implant   HERNIA REPAIR     inguinal hernia repair   REMOVAL OF STONES  10/25/2020   Procedure: REMOVAL OF STONES;  Surgeon: Vida Rigger, MD;  Location: [facility] ENDOSCOPY;  Service: Endoscopy;;   SPHINCTEROTOMY  10/25/2020   Procedure: Dennison Mascot;  Surgeon: Vida Rigger, MD;  Location: [facility] ENDOSCOPY;  Service: Endoscopy;;    ROS:   General:  No weight loss, Fever, chills  HEENT: No recent headaches, no nasal bleeding, no visual changes, no sore throat  Neurologic: No dizziness, blackouts, seizures. No recent symptoms of stroke or mini- stroke. No recent episodes of slurred speech, or temporary blindness.  Cardiac: No recent episodes of chest pain/pressure, no shortness of breath at rest.  No shortness of breath with exertion.  Denies history of atrial fibrillation or irregular heartbeat  Vascular: No history of rest pain in feet.  No history of claudication.  positive history of non-healing ulcer, No history of DVT   Pulmonary: No home oxygen, no productive cough, no hemoptysis,  No asthma or wheezing  Musculoskeletal:  [ ]  Arthritis, [ ]  Low back pain,  [ ]  Joint pain  Hematologic:No history of hypercoagulable state.  No history of easy bleeding.  No history of anemia  Gastrointestinal: No hematochezia or melena,  No gastroesophageal reflux, no trouble swallowing  Urinary: [ ]  chronic Kidney disease, [ ]  on HD - [ ]   MWF or [ ]  TTHS, [ ]  Burning with urination, [ ]  Frequent urination, [ ]  Difficulty urinating;   Skin: No rashes  Psychological: No history of anxiety,  No history of depression  Social History Social History   Tobacco Use   Smoking status: Never    Passive exposure: Never   Smokeless tobacco: Never  Vaping Use   Vaping Use: Never used  Substance Use Topics   Alcohol use: Not Currently    Comment: very rare   Drug use: No    Family History Family History  Problem Relation Age of Onset   Heart disease Neg Hx        No heart disease in parents     Allergies  No Known Allergies   Current Outpatient Medications  Medication Sig Dispense Refill   acetaminophen (TYLENOL) 325 MG tablet Take 650 mg by mouth in the morning and at bedtime.     aspirin EC 81 MG tablet Take 1 tablet (81 mg total) by mouth daily. Swallow whole. 30 tablet 12   clopidogrel (PLAVIX) 75 MG tablet Take 1 tablet (75 mg total) by mouth daily. 30 tablet 2   feeding supplement (ENSURE ENLIVE / ENSURE PLUS) LIQD Take 237 mLs by mouth 2 (two) times daily between meals. (Patient taking differently: Take 237 mLs by mouth 2 (two) times daily between meals. Chocolate) 237 mL 12   furosemide (LASIX) 40 MG tablet Take 1 tablet (40 mg total) by mouth 2 (two) times daily. 180 tablet 0   LORazepam (ATIVAN) 0.5 MG tablet Take 0.5 mg by mouth every 4 (four) hours as needed for anxiety.     melatonin 3 MG TABS tablet Take 1 tablet (3 mg total) by mouth at bedtime.  0   oxyCODONE-acetaminophen (PERCOCET) 5-325 MG tablet Take 1 tablet by mouth every 6 (six) hours as needed for severe pain. 15 tablet 0   pantoprazole (PROTONIX) 40 MG tablet Take 1 tablet (40 mg total) by mouth daily. 90 tablet 0   polyethylene glycol (MIRALAX / GLYCOLAX) 17 g packet Take 17 g by mouth daily. 14 each 0   QUEtiapine (SEROQUEL) 25 MG tablet Take 1 tablet (25 mg total) by mouth at bedtime. 90 tablet 0   senna-docusate (SENOKOT-S) 8.6-50 MG tablet Take 1 tablet by mouth at bedtime as needed for mild constipation. (Patient taking differently: Take 1 tablet by mouth daily.)     sulfamethoxazole-trimethoprim (BACTRIM DS) 800-160 MG tablet Take 1 tablet by mouth 2 (two) times daily.     tamsulosin (FLOMAX) 0.4 MG CAPS capsule Take 1 capsule (0.4 mg total) by mouth at bedtime. 90 capsule 0   atorvastatin (LIPITOR) 40 MG tablet Take 1 tablet (40 mg total) by mouth daily. 30 tablet 2   No current facility-administered medications for this visit.    Physical Examination  Vitals:   08/07/22 1312  BP: (!)  88/49  Pulse: 87  Resp: 20  Temp: (!) 97.5 F (36.4 C)  TempSrc: Temporal  SpO2: 98%  Height: 5\' 7"  (1.702 m)    Body mass index is 27.31 kg/m.  General:  Alert and oriented, no acute distress HEENT: Normal Neck: No bruit or JVD Pulmonary: Clear to auscultation bilaterally Cardiac: Regular Rate and Rhythm without murmur Abdomen: Soft, non-tender, non-distended, no mass, no scars Skin: No rash     Extremity Pulses: radial, left femoral is palpable, non palpable dorsalis pedis, posterior tibial pulses bilaterally Musculoskeletal: positive dependent  pedal edema Neurologic: no active B LE  independent motor  DATA:  ABI Findings:  +---------+------------------+-----+----------+--------+  Right   Rt Pressure (mmHg)IndexWaveform  Comment   +---------+------------------+-----+----------+--------+  Brachial 81                                         +---------+------------------+-----+----------+--------+  PTA     74                0.67 monophasic          +---------+------------------+-----+----------+--------+  PERO    53                0.48 monophasic          +---------+------------------+-----+----------+--------+  DP      73                0.66 monophasic          +---------+------------------+-----+----------+--------+  Great Toe37                0.33 Abnormal            +---------+------------------+-----+----------+--------+   +---------+------------------+-----+----------+---------+  Left    Lt Pressure (mmHg)IndexWaveform  Comment    +---------+------------------+-----+----------+---------+  Brachial 111                                         +---------+------------------+-----+----------+---------+  PTA     18                0.16 monophasic           +---------+------------------+-----+----------+---------+  PERO    82                .74  monophasic            +---------+------------------+-----+----------+---------+  DP      75                0.68 monophasic           +---------+------------------+-----+----------+---------+  Great Toe                                 amputated  +---------+------------------+-----+----------+---------+   +-------+-----------+-----------+------------+------------+  ABI/TBIToday's ABIToday's TBIPrevious ABIPrevious TBI  +-------+-----------+-----------+------------+------------+  Right .67        .33        N/C         .33           +-------+-----------+-----------+------------+------------+  Left  .74        0          N/C         0             +-------+-----------+-----------+------------+------------+     Bilateral ABIs appear decreased compared to prior study on 01/01/2022.    Summary:  Right: Resting right ankle-brachial index indicates moderate right lower  extremity arterial disease. The right toe-brachial index is abnormal.   Left: Resting left ankle-brachial index indicates moderate left lower  extremity arterial disease.     ASSESSMENT/PLAN:   B LE PAD s/p endovascular intervention on the left LE and second toe amputation.  The amputation site has fully healed and he has no left LE ischemic changes.  New right GT wound with purulent drainage and chronic history of  right heel ulcer.  The heel ulcer appears stable.     Earvin Hansen Nath has atherosclerosis of the native arteries of the Right lower extremities causing gangrene of the GT toe with purulent drainage. The patient is on best medical therapy for peripheral arterial disease.  An aortogram with bilateral lower extremity runoff angiography and Right lower extremity intervention and is indicated to better evaluate the patient's lower extremity circulation because of the right limb threatening nature of the patient's diagnosis. Based on the patient's clinical exam and non-invasive data, we anticipate an endovascular  intervention in the femoropopliteal and tibial vessels.    He will then need a first ray amputation to prevent sepsis from the infected GT toe wound.  His daughter agrees to proceed with this plan of care.     Of note he has CKD and received gentle hydration prior to his angiogram  Mosetta Pigeon PA-C [facility]: [phone]  MD in clinic Dr. Randie Heinz he did see the patient in conjunction with me for plan.

## 2022-08-09 ENCOUNTER — Other Ambulatory Visit: Payer: Self-pay

## 2022-08-09 DIAGNOSIS — I70235 Atherosclerosis of native arteries of right leg with ulceration of other part of foot: Secondary | ICD-10-CM

## 2022-08-09 DIAGNOSIS — I96 Gangrene, not elsewhere classified: Secondary | ICD-10-CM

## 2022-08-14 ENCOUNTER — Encounter (HOSPITAL_COMMUNITY)
Admission: RE | Disposition: A | Discharge status: Home / Self Care | Payer: Self-pay | Source: Home / Self Care | Attending: Vascular Surgery

## 2022-08-14 ENCOUNTER — Ambulatory Visit (HOSPITAL_COMMUNITY)
Admission: RE | Admit: 2022-08-14 | Discharge: 2022-08-14 | Disposition: A | Discharge status: Home / Self Care | Payer: Medicare Other | Attending: Vascular Surgery | Admitting: Vascular Surgery

## 2022-08-14 DIAGNOSIS — L97519 Non-pressure chronic ulcer of other part of right foot with unspecified severity: Secondary | ICD-10-CM | POA: Insufficient documentation

## 2022-08-14 DIAGNOSIS — I739 Peripheral vascular disease, unspecified: Secondary | ICD-10-CM | POA: Diagnosis present

## 2022-08-14 DIAGNOSIS — N189 Chronic kidney disease, unspecified: Secondary | ICD-10-CM | POA: Diagnosis not present

## 2022-08-14 DIAGNOSIS — I70261 Atherosclerosis of native arteries of extremities with gangrene, right leg: Secondary | ICD-10-CM | POA: Diagnosis not present

## 2022-08-14 DIAGNOSIS — L97419 Non-pressure chronic ulcer of right heel and midfoot with unspecified severity: Secondary | ICD-10-CM | POA: Diagnosis not present

## 2022-08-14 DIAGNOSIS — I70235 Atherosclerosis of native arteries of right leg with ulceration of other part of foot: Secondary | ICD-10-CM

## 2022-08-14 DIAGNOSIS — Z539 Procedure and treatment not carried out, unspecified reason: Secondary | ICD-10-CM | POA: Diagnosis not present

## 2022-08-14 DIAGNOSIS — I96 Gangrene, not elsewhere classified: Secondary | ICD-10-CM

## 2022-08-14 LAB — POCT I-STAT, CHEM 8
BUN: 41 mg/dL — ABNORMAL HIGH (ref 8–23)
BUN: 27 mg/dL — ABNORMAL HIGH (ref 8–23)
Calcium, Ion: 0.92 mmol/L — ABNORMAL LOW (ref 1.15–1.40)
Calcium, Ion: 1.13 mmol/L — ABNORMAL LOW (ref 1.15–1.40)
Chloride: 103 mmol/L (ref 98–111)
Chloride: 102 mmol/L (ref 98–111)
Creatinine, Ser: 1.8 mg/dL — ABNORMAL HIGH (ref 0.61–1.24)
Creatinine, Ser: 1.8 mg/dL — ABNORMAL HIGH (ref 0.61–1.24)
Glucose, Bld: 83 mg/dL (ref 70–99)
Glucose, Bld: 86 mg/dL (ref 70–99)
HCT: 29 % — ABNORMAL LOW (ref 39.0–52.0)
HCT: 28 % — ABNORMAL LOW (ref 39.0–52.0)
Hemoglobin: 9.9 g/dL — ABNORMAL LOW (ref 13.0–17.0)
Hemoglobin: 9.5 g/dL — ABNORMAL LOW (ref 13.0–17.0)
Potassium: 6.5 mmol/L (ref 3.5–5.1)
Potassium: 3.6 mmol/L (ref 3.5–5.1)
Sodium: 137 mmol/L (ref 135–145)
Sodium: 141 mmol/L (ref 135–145)
TCO2: 29 mmol/L (ref 22–32)
TCO2: 29 mmol/L (ref 22–32)

## 2022-08-14 SURGERY — ABDOMINAL AORTOGRAM W/LOWER EXTREMITY
Anesthesia: LOCAL

## 2022-08-14 MED ORDER — SODIUM CHLORIDE 0.9 % IV SOLN: INTRAVENOUS | Status: DC

## 2022-08-14 NOTE — Progress Notes (Signed)
[facility] [place] HISTORY AND PHYSICAL   Patient seen and examined in preop holding.  No complaints. Has not walked in over a year.  Per his daughter, he has had significantly decreased PO intake - Cr elevated today.  He continues to ask his daughters to let him go.  He is DNR. On physical exam, he was cachectic, and overall appeared to be doing poorly.  Angiogram canceled.  I had an honest discussion with Tacey Ruiz and his daughter regarding his overall clinical status.  I think his life expectancy is less than 6 months.  Especially with poor p.o. intake.  I think he would benefit from comfort care discussions. The heel wound is from being bedridden, toe wound from arterial insufficiency.  Regardless, he is not a revascularization candidate at this time as he is nonambulatory.  Should wounds progress, the only surgery I would offer would be above-knee amputation.  His daughter was appreciative of the frank discussion, and stated she was relieved to have comfort measures discussions with Hasaan's primary care and her sisters.  I told her I am happy to see him back in clinic should they have any questions or concerns.  Victorino Sparrow MD   History of Present Illness:  Patient is a 89 y.o. year old male who presents for evaluation of PAD with new right GT wound.  His daughter states he had the GT nail fall off first and now the toe is black where the nail was with purulent drainage.  He has been started on oral antibiotics.  He has a know right heel wound that has been stable.  He was originally seen for left second toe gangrene in the [facility] ED by Dr. Edilia Bo.  He is afebrile and denies fever or chills.     He underwent angiogram with  Left posterior tibial artery angioplasty (2 mm x 200 mm Sterling) followed by Ray amputation left second toe by Dr. Edilia Bo.  The second toe amputation has fully healed.  He had amputation of the left great toe 2 years ago.    He is non ambulatory and  mode of transportation is WC.  He has 5 daughters that look after him taking turns.  His daughter [name] is here with him.  His daughter is afraid he will get sepsis.     He is medically managed on ASA, Plavix and Statin daily.       Past Surgical History:  Procedure Laterality Date   ABDOMINAL AORTOGRAM W/LOWER EXTREMITY N/A 01/03/2022   Procedure: ABDOMINAL AORTOGRAM W/LOWER EXTREMITY;  Surgeon: Cephus Shelling, MD;  Location: [facility]VAS[facility] LAB;  Service: Cardiovascular;  Laterality: N/A;   AMPUTATION Left 02/22/2014   Procedure: INCISION AND DRAINAGE LEFT FOREFOOT AMPUTATION FIRST RAY;  Surgeon: Toni Arthurs, MD;  Location: [facility] OR;  Service: Orthopedics;  Laterality: Left;   AMPUTATION Left 01/14/2022   Procedure: LEFT FOOT SECOND TOE AMPUTATION;  Surgeon: Chuck Hint, MD;  Location: [facility] OR;  Service: Vascular;  Laterality: Left;   APPENDECTOMY     ERCP N/A 10/25/2020   Procedure: ENDOSCOPIC RETROGRADE CHOLANGIOPANCREATOGRAPHY (ERCP);  Surgeon: Vida Rigger, MD;  Location: [facility];  Service: Endoscopy;  Laterality: N/A;   EYE SURGERY Bilateral    cataract surgery w/ lens implant   HERNIA REPAIR     inguinal hernia repair   REMOVAL OF STONES  10/25/2020   Procedure: REMOVAL OF STONES;  Surgeon: Vida Rigger, MD;  Location: [facility];  Service: Endoscopy;;  SPHINCTEROTOMY  10/25/2020   Procedure: SPHINCTEROTOMY;  Surgeon: Vida Rigger, MD;  Location: [facility];  Service: Endoscopy;;    ROS:   General:  No weight loss, Fever, chills  HEENT: No recent headaches, no nasal bleeding, no visual changes, no sore throat  Neurologic: No dizziness, blackouts, seizures. No recent symptoms of stroke or mini- stroke. No recent episodes of slurred speech, or temporary blindness.  Cardiac: No recent episodes of chest pain/pressure, no shortness of breath at rest.  No shortness of breath with exertion.  Denies history of atrial fibrillation or irregular heartbeat  Vascular:  No history of rest pain in feet.  No history of claudication.  positive history of non-healing ulcer, No history of DVT   Pulmonary: No home oxygen, no productive cough, no hemoptysis,  No asthma or wheezing  Musculoskeletal:  [ ]  Arthritis, [ ]  Low back pain,  [ ]  Joint pain  Hematologic:No history of hypercoagulable state.  No history of easy bleeding.  No history of anemia  Gastrointestinal: No hematochezia or melena,  No gastroesophageal reflux, no trouble swallowing  Urinary: [ ]  chronic Kidney disease, [ ]  on HD - [ ]  MWF or [ ]  TTHS, [ ]  Burning with urination, [ ]  Frequent urination, [ ]  Difficulty urinating;   Skin: No rashes  Psychological: No history of anxiety,  No history of depression  Social History Social History   Tobacco Use   Smoking status: Never    Passive exposure: Never   Smokeless tobacco: Never  Vaping Use   Vaping Use: Never used  Substance Use Topics   Alcohol use: Not Currently    Comment: very rare   Drug use: No    Family History Family History  Problem Relation Age of Onset   Heart disease Neg Hx        No heart disease in parents    Allergies  No Known Allergies   Current Facility-Administered Medications  Medication Dose Route Frequency Provider Last Rate Last Admin   0.9 %  sodium chloride infusion   Intravenous Continuous Victorino Sparrow, MD 100 mL/hr at 08/14/22 0859 New Bag at 08/14/22 0859    Physical Examination  Vitals:   08/14/22 0856  BP: 105/66  Pulse: 97  Temp: 97.7 F (36.5 C)  TempSrc: Oral  SpO2: 98%  Weight: 77.1 kg  Height: 5\' 10"  (1.778 m)    Body mass index is 24.39 kg/m.  General:  Alert and oriented, no acute distress HEENT: Normal Neck: No bruit or JVD Pulmonary: Clear to auscultation bilaterally Cardiac: Regular Rate and Rhythm without murmur Abdomen: Soft, non-tender, non-distended, no mass, no scars Skin: No rash     Extremity Pulses: radial, left femoral is palpable, non palpable  dorsalis pedis, posterior tibial pulses bilaterally Musculoskeletal: positive dependent  pedal edema Neurologic: no active B LE independent motor  DATA:  ABI Findings:  +---------+------------------+-----+----------+--------+  Right   Rt Pressure (mmHg)IndexWaveform  Comment   +---------+------------------+-----+----------+--------+  Brachial 81                                         +---------+------------------+-----+----------+--------+  PTA     74                0.67 monophasic          +---------+------------------+-----+----------+--------+  PERO    53  0.48 monophasic          +---------+------------------+-----+----------+--------+  DP      73                0.66 monophasic          +---------+------------------+-----+----------+--------+  Great Toe37                0.33 Abnormal            +---------+------------------+-----+----------+--------+   +---------+------------------+-----+----------+---------+  Left    Lt Pressure (mmHg)IndexWaveform  Comment    +---------+------------------+-----+----------+---------+  Brachial 111                                         +---------+------------------+-----+----------+---------+  PTA     18                0.16 monophasic           +---------+------------------+-----+----------+---------+  PERO    82                .74  monophasic           +---------+------------------+-----+----------+---------+  DP      75                0.68 monophasic           +---------+------------------+-----+----------+---------+  Great Toe                                 amputated  +---------+------------------+-----+----------+---------+   +-------+-----------+-----------+------------+------------+  ABI/TBIToday's ABIToday's TBIPrevious ABIPrevious TBI  +-------+-----------+-----------+------------+------------+  Right .67        .33        N/C          .33           +-------+-----------+-----------+------------+------------+  Left  .74        0          N/C         0             +-------+-----------+-----------+------------+------------+     Bilateral ABIs appear decreased compared to prior study on 01/01/2022.    Summary:  Right: Resting right ankle-brachial index indicates moderate right lower  extremity arterial disease. The right toe-brachial index is abnormal.   Left: Resting left ankle-brachial index indicates moderate left lower  extremity arterial disease.     ASSESSMENT/PLAN:   B LE PAD s/p endovascular intervention on the left LE and second toe amputation.  The amputation site has fully healed and he has no left LE ischemic changes.  New right GT wound with purulent drainage and chronic history of right heel ulcer.  The heel ulcer appears stable.     Earvin Hansen Loeza has atherosclerosis of the native arteries of the Right lower extremities causing gangrene of the GT toe with purulent drainage. The patient is on best medical therapy for peripheral arterial disease.  An aortogram with bilateral lower extremity runoff angiography and Right lower extremity intervention and is indicated to better evaluate the patient's lower extremity circulation because of the right limb threatening nature of the patient's diagnosis. Based on the patient's clinical exam and non-invasive data, we anticipate an endovascular intervention in the femoropopliteal and tibial vessels.    He will then need a first ray amputation to prevent sepsis from the infected  GT toe wound.  His daughter agrees to proceed with this plan of care.     Of note he has CKD and received gentle hydration prior to his angiogram  Victorino Sparrow PA-C [facility] [place] Office: [phone]  MD in clinic Dr. Randie Heinz he did see the patient in conjunction with me for plan.

## 2022-08-14 NOTE — H&P (Signed)
[facility] [place] HISTORY AND PHYSICAL   Patient seen and examined in preop holding.  No complaints. Has not walked in over a year.  Per his daughter, he has had significantly decreased PO intake - Cr elevated today.  He continues to ask his daughters to let him go.  He is DNR. On physical exam, he was cachectic, and overall appeared to be doing poorly.  Angiogram canceled.  I had an honest discussion with Cory Hansen and his daughter regarding his overall clinical status.  I think his life expectancy is less than 6 months.  Especially with poor p.o. intake.  I think he would benefit from comfort care discussions. The heel wound is from being bedridden, toe wound from arterial insufficiency.  Regardless, he is not a revascularization candidate at this time as he is nonambulatory.  Should wounds progress, the only surgery I would offer would be above-knee amputation.  His daughter was appreciative of the frank discussion, and stated she was relieved to have comfort measures discussions with Cory Hansen's primary care and her sisters.  I told her I am happy to see him back in clinic should they have any questions or concerns.  Cory Hansen E Cory Mintzer MD   History of Present Illness:  Patient is a 89 y.o. year old male who presents for evaluation of PAD with new right GT wound.  His daughter states he had the GT nail fall off first and now the toe is black where the nail was with purulent drainage.  He has been started on oral antibiotics.  He has a know right heel wound that has been stable.  He was originally seen for left second toe gangrene in the [facility] by Dr. Dickson.  He is afebrile and denies fever or chills.     He underwent angiogram with  Left posterior tibial artery angioplasty (2 mm x 200 mm [place]) followed by Ray amputation left second toe by Dr. Dickson.  The second toe amputation has fully healed.  He had amputation of the left great toe 2 years ago.    He is non ambulatory and  mode of transportation is WC.  He has 5 daughters that look after him taking turns.  His daughter Cory Hansen is here with him.  His daughter is afraid he will get sepsis.     He is medically managed on ASA, Plavix and Statin daily.       Past Surgical History:  Procedure Laterality Date   ABDOMINAL AORTOGRAM W/LOWER EXTREMITY N/A 01/03/2022   Procedure: ABDOMINAL AORTOGRAM W/LOWER EXTREMITY;  Surgeon: Clark, Christopher J, MD;  Location: [facility];  Service: Cardiovascular;  Laterality: N/A;   AMPUTATION Left 02/22/2014   Procedure: INCISION AND DRAINAGE LEFT FOREFOOT AMPUTATION FIRST RAY;  Surgeon: [name], MD;  Location: [facility] OR;  Service: Orthopedics;  Laterality: Left;   AMPUTATION Left 01/14/2022   Procedure: LEFT FOOT SECOND TOE AMPUTATION;  Surgeon: Dickson, Christopher S, MD;  Location: [facility] OR;  Service: Vascular;  Laterality: Left;   APPENDECTOMY     ERCP N/A 10/25/2020   Procedure: ENDOSCOPIC RETROGRADE CHOLANGIOPANCREATOGRAPHY (ERCP);  Surgeon: Magod, Marc, MD;  Location: [facility];  Service: Endoscopy;  Laterality: N/A;   EYE SURGERY Bilateral    cataract surgery w/ lens implant   HERNIA REPAIR     inguinal hernia repair   REMOVAL OF STONES  10/25/2020   Procedure: REMOVAL OF STONES;  Surgeon: Magod, Marc, MD;  Location: [facility];  Service: Endoscopy;;     SPHINCTEROTOMY  10/25/2020   Procedure: SPHINCTEROTOMY;  Surgeon: Magod, Marc, MD;  Location: [facility];  Service: Endoscopy;;    ROS:   General:  No weight loss, Fever, chills  HEENT: No recent headaches, no nasal bleeding, no visual changes, no sore throat  Neurologic: No dizziness, blackouts, seizures. No recent symptoms of stroke or mini- stroke. No recent episodes of slurred speech, or temporary blindness.  Cardiac: No recent episodes of chest pain/pressure, no shortness of breath at rest.  No shortness of breath with exertion.  Denies history of atrial fibrillation or irregular heartbeat  Vascular:  No history of rest pain in feet.  No history of claudication.  positive history of non-healing ulcer, No history of DVT   Pulmonary: No home oxygen, no productive cough, no hemoptysis,  No asthma or wheezing  Musculoskeletal:  [ ] Arthritis, [ ] Low back pain,  [ ] Joint pain  Hematologic:No history of hypercoagulable state.  No history of easy bleeding.  No history of anemia  Gastrointestinal: No hematochezia or melena,  No gastroesophageal reflux, no trouble swallowing  Urinary: [ ] chronic Kidney disease, [ ] on HD - [ ] MWF or [ ] TTHS, [ ] Burning with urination, [ ] Frequent urination, [ ] Difficulty urinating;   Skin: No rashes  Psychological: No history of anxiety,  No history of depression  Social History Social History   Tobacco Use   Smoking status: Never    Passive exposure: Never   Smokeless tobacco: Never  Vaping Use   Vaping Use: Never used  Substance Use Topics   Alcohol use: Not Currently    Comment: very rare   Drug use: No    Family History Family History  Problem Relation Age of Onset   Heart disease Neg Hx        No heart disease in parents    Allergies  No Known Allergies   Current Facility-Administered Medications  Medication Dose Route Frequency Provider Last Rate Last Admin   0.9 %  sodium chloride infusion   Intravenous Continuous Kimon Loewen E, MD 100 mL/hr at 08/14/22 0859 New Bag at 08/14/22 0859    Physical Examination  Vitals:   08/14/22 0856  BP: 105/66  Pulse: 97  Temp: 97.7 F (36.5 C)  TempSrc: Oral  SpO2: 98%  Weight: 77.1 kg  Height: 5' 10" (1.778 m)    Body mass index is 24.39 kg/m.  General:  Alert and oriented, no acute distress HEENT: Normal Neck: No bruit or JVD Pulmonary: Clear to auscultation bilaterally Cardiac: Regular Rate and Rhythm without murmur Abdomen: Soft, non-tender, non-distended, no mass, no scars Skin: No rash     Extremity Pulses: radial, left femoral is palpable, non palpable  dorsalis pedis, posterior tibial pulses bilaterally Musculoskeletal: positive dependent  pedal edema Neurologic: no active B LE independent motor  DATA:  ABI Findings:  +---------+------------------+-----+----------+--------+  Right   Rt Pressure (mmHg)IndexWaveform  Comment   +---------+------------------+-----+----------+--------+  Brachial 81                                         +---------+------------------+-----+----------+--------+  PTA     74                0.67 monophasic          +---------+------------------+-----+----------+--------+  PERO    53                  0.48 monophasic          +---------+------------------+-----+----------+--------+  DP      73                0.66 monophasic          +---------+------------------+-----+----------+--------+  Great Toe37                0.33 Abnormal            +---------+------------------+-----+----------+--------+   +---------+------------------+-----+----------+---------+  Left    Lt Pressure (mmHg)IndexWaveform  Comment    +---------+------------------+-----+----------+---------+  Brachial 111                                         +---------+------------------+-----+----------+---------+  PTA     18                0.16 monophasic           +---------+------------------+-----+----------+---------+  PERO    82                .74  monophasic           +---------+------------------+-----+----------+---------+  DP      75                0.68 monophasic           +---------+------------------+-----+----------+---------+  Great Toe                                 amputated  +---------+------------------+-----+----------+---------+   +-------+-----------+-----------+------------+------------+  ABI/TBIToday's ABIToday's TBIPrevious ABIPrevious TBI  +-------+-----------+-----------+------------+------------+  Right .67        .33        N/C          .33           +-------+-----------+-----------+------------+------------+  Left  .74        0          N/C         0             +-------+-----------+-----------+------------+------------+     Bilateral ABIs appear decreased compared to prior study on 01/01/2022.    Summary:  Right: Resting right ankle-brachial index indicates moderate right lower  extremity arterial disease. The right toe-brachial index is abnormal.   Left: Resting left ankle-brachial index indicates moderate left lower  extremity arterial disease.     ASSESSMENT/PLAN:   B LE PAD s/p endovascular intervention on the left LE and second toe amputation.  The amputation site has fully healed and he has no left LE ischemic changes.  New right GT wound with purulent drainage and chronic history of right heel ulcer.  The heel ulcer appears stable.     Cory Hansen has atherosclerosis of the native arteries of the Right lower extremities causing gangrene of the GT toe with purulent drainage. The patient is on best medical therapy for peripheral arterial disease.  An aortogram with bilateral lower extremity runoff angiography and Right lower extremity intervention and is indicated to better evaluate the patient's lower extremity circulation because of the right limb threatening nature of the patient's diagnosis. Based on the patient's clinical exam and non-invasive data, we anticipate an endovascular intervention in the femoropopliteal and tibial vessels.    He will then need a first ray amputation to prevent sepsis from the infected   GT toe wound.  His daughter agrees to proceed with this plan of care.     Of note he has CKD and received gentle hydration prior to his angiogram  Tuwana Kapaun E Dameion Briles PA-C [facility]  Office: [phone]  MD in clinic Dr. Cain he did see the patient in conjunction with me for plan.   

## 2022-08-15 ENCOUNTER — Encounter: Payer: Self-pay | Admitting: Internal Medicine

## 2022-08-16 ENCOUNTER — Telehealth: Payer: Medicare Other | Admitting: Internal Medicine

## 2022-08-21 ENCOUNTER — Encounter: Payer: Self-pay | Admitting: Internal Medicine

## 2022-09-07 DEATH — deceased

## 2022-10-01 ENCOUNTER — Other Ambulatory Visit: Payer: Self-pay | Admitting: Internal Medicine
